# Patient Record
Sex: Female | Born: 1935 | Race: White | Hispanic: No | Smoking: Former smoker
Health system: Southern US, Community
[De-identification: ages and names within clinical notes are randomized; demographics above are authoritative.]

## PROBLEM LIST (undated history)

## (undated) DIAGNOSIS — L84 Corns and callosities: Secondary | ICD-10-CM

## (undated) DIAGNOSIS — J45909 Unspecified asthma, uncomplicated: Secondary | ICD-10-CM

## (undated) DIAGNOSIS — I10 Essential (primary) hypertension: Secondary | ICD-10-CM

## (undated) DIAGNOSIS — Z6834 Body mass index (BMI) 34.0-34.9, adult: Secondary | ICD-10-CM

## (undated) DIAGNOSIS — I509 Heart failure, unspecified: Secondary | ICD-10-CM

## (undated) DIAGNOSIS — E039 Hypothyroidism, unspecified: Secondary | ICD-10-CM

## (undated) DIAGNOSIS — K219 Gastro-esophageal reflux disease without esophagitis: Secondary | ICD-10-CM

## (undated) DIAGNOSIS — E119 Type 2 diabetes mellitus without complications: Secondary | ICD-10-CM

## (undated) DIAGNOSIS — E559 Vitamin D deficiency, unspecified: Secondary | ICD-10-CM

## (undated) DIAGNOSIS — K2970 Gastritis, unspecified, without bleeding: Secondary | ICD-10-CM

## (undated) DIAGNOSIS — M1990 Unspecified osteoarthritis, unspecified site: Secondary | ICD-10-CM

## (undated) DIAGNOSIS — M549 Dorsalgia, unspecified: Secondary | ICD-10-CM

## (undated) DIAGNOSIS — J309 Allergic rhinitis, unspecified: Secondary | ICD-10-CM

## (undated) DIAGNOSIS — G4730 Sleep apnea, unspecified: Secondary | ICD-10-CM

## (undated) DIAGNOSIS — E785 Hyperlipidemia, unspecified: Secondary | ICD-10-CM

## (undated) DIAGNOSIS — M7072 Other bursitis of hip, left hip: Secondary | ICD-10-CM

## (undated) DIAGNOSIS — I2510 Atherosclerotic heart disease of native coronary artery without angina pectoris: Secondary | ICD-10-CM

## (undated) HISTORY — DX: Essential (primary) hypertension: I10

## (undated) HISTORY — DX: Sleep apnea, unspecified: G47.30

## (undated) HISTORY — PX: TOTAL HIP ARTHROPLASTY: SHX124

## (undated) HISTORY — PX: BREAST BIOPSY: SHX20

## (undated) HISTORY — DX: Unspecified osteoarthritis, unspecified site: M19.90

## (undated) HISTORY — DX: Vitamin D deficiency, unspecified: E55.9

## (undated) HISTORY — DX: Body mass index (BMI) 34.0-34.9, adult: Z68.34

## (undated) HISTORY — DX: Atherosclerotic heart disease of native coronary artery without angina pectoris: I25.10

## (undated) HISTORY — DX: Dorsalgia, unspecified: M54.9

## (undated) HISTORY — DX: Corns and callosities: L84

## (undated) HISTORY — DX: Hyperlipidemia, unspecified: E78.5

## (undated) HISTORY — DX: Gastro-esophageal reflux disease without esophagitis: K21.9

## (undated) HISTORY — DX: Type 2 diabetes mellitus without complications: E11.9

## (undated) HISTORY — DX: Unspecified asthma, uncomplicated: J45.909

## (undated) HISTORY — DX: Hypothyroidism, unspecified: E03.9

## (undated) HISTORY — DX: Gastritis, unspecified, without bleeding: K29.70

## (undated) HISTORY — DX: Other bursitis of hip, left hip: M70.72

## (undated) HISTORY — DX: Allergic rhinitis, unspecified: J30.9

## (undated) SURGERY — ESOPHAGOGASTRODUODENOSCOPY (EGD) WITH PROPOFOL
Anesthesia: Monitor Anesthesia Care

## (undated) NOTE — *Deleted (*Deleted)
***In Progress*** PCP: Lucianne Lei, MD  Cardiology: Garwin Brothers, MD HF Cardiology: Dr. Shirlee Latch  HPI:  64 y.o. with history of chronic systolic CHF (nonischemic cardiomyopathy), chronic LBBB, and mitral regurgitation was referred by Dr. Excell Seltzer for CHF evaluation prior to Mitraclip placement.   CHF was first noted in 12/20, she was admitted to the hospital in [facility] at that time.  LHC showed nonobstructive CAD.  She later followed up with Dr. Tomie China and had echo in 3/21, showing EF 35-40% with concern for severe MR. TEE was then done in 4/21 and I reviewed it today, showing EF 30-35% with septal-lateral dyssynchrony, moderate-severe functional MR, normal RV, severe TR.  She has had a LBBB on ECGs in 2021, she did not have LBBB in 2019.    Cardiac MRI in 6/21 showed moderate LV dilation, EF 27%, moderately decreased RV function with EF 31%, no LGE, probably moderate MR.   She had St Jude CRT-P device implanted in 7/21.  Echo was done today and reviewed, EF up to 40% with normal RV, mild-moderate MR.   She returned for follow up of CHF with Dr. Shirlee Latch 07/22/20. Weight was stable. No chest pain.  No orthopnea/PND.  Reported walking for 15 minutes in the evening without dyspnea.  Generally, no trouble walking on flat ground.  She reported dizziness only if she bends over and stands back up too quickly. She was off Jardiance but had not had symptoms consistent with yeast infection or UTI.   Today she returns to HF clinic for pharmacist medication titration. At last visit with Dr. Shirlee Latch 07/22/20, [place] Pepper was restarted at 10 mg daily and  digoxin was decreased to 0.0625 mg QOD following return of digoxin level  Overall feeling ***. Dizziness, lightheadedness, fatigue:  Chest pain or palpitations.  How is your breathing?: *** SOB Able to complete all ADLs. Activity level ***  Weight at home pounds. Takes furosemide/torsemide/bumex *** mg *** daily.  PND/Orthopnea:   Appetite ***.    HF Medications:  Metoprolol succinate 100 mg daily Spironolactone 25 mg QHS Empagliflozin 10 mg daily Digoxin 0.0625 mg every other day  Furosemide 60 mg BID  Has the patient been experiencing any side effects to the medications prescribed?  {YES NO:22349}  Does the patient have any problems obtaining medications due to transportation or finances?   {YES [id number] [employer]  Understanding of regimen: {excellent/good/fair/poor:19665} Understanding of indications: {excellent/good/fair/poor:19665} Potential of compliance: {excellent/good/fair/poor:19665} Patient understands to avoid NSAIDs. Patient understands to avoid decongestants.    Pertinent Lab Values 07/22/20: . Serum creatinine 1.24, BUN 27, Potassium 4.0, Sodium 139, Digoxin 1.5, <0.4 on 07/29/20   Vital Signs: . Weight: *** (last clinic weight: 140 lbs) . Blood pressure:  . Heart rate:  Assessment: 1. Chronic systolic CHF: Patient has a nonischemic cardiomyopathy of uncertain etiology.  She has significant mitral regurgitation.  This is functional, likely does not explain her cardiomyopathy (though mitral regurgitation likely worsens symptoms).  She has a LBBB that is new since the prior ECG in 2019, cannot rule out LBBB cardiomyopathy.  Prior myocarditis is also a consideration.  TEE in 4/21 showed EF 30-35% with prominent septal-lateral dyssynchrony.  Cardiac MRI in 6/21 showed moderate LV dilation, EF 27%, moderately decreased RV function with EF 31%, no LGE, probably moderate MR.  St Jude CRT-P device implanted in 7/21.  Echo 07/22/20 with EF up to 40%, improved MR (only mild-moderate). NYHA class II symptoms, not volume overloaded on exam.  BP remains soft, making medication  titration difficult***.  - Continue furosemide 60 mg BID.  - Continue metoprolol succinate 100 mg daily.    - Continue spironolactone 25 mg QHS.  - Continue digoxin 0.0625 mg every other day - Continue Jardiance 10 mg daily.   - No BP room for Entresto.  - Has been referred to cardiac rehab at [facility].  2. CAD: Nonobstructive on 2020 cath.  No chest pain.  - Continue ASA 81 and rosuvastatin.   3. Mitral regurgitation: She has functional mitral regurgitation, moderate-severe/3+ at least on TEE in 4/21.  However, after CRT, mitral regurgitation was mild-moderate on 9/21 echo.    Plan: 1) Medication changes: Based on clinical presentation, vital signs and recent labs will *** 2) Labs: *** 3) Follow-up: ***   Karle Plumber, PharmD, BCPS, BCCP, CPP Heart Failure Clinic Pharmacist [phone]

---

## 1963-10-26 HISTORY — PX: TONSILLECTOMY: SUR1361

## 1968-10-25 HISTORY — PX: HEMORRHOIDECTOMY WITH HEMORRHOID BANDING: SHX5633

## 1994-10-25 HISTORY — PX: DILATION AND CURETTAGE OF UTERUS: SHX78

## 1998-09-15 ENCOUNTER — Encounter: Payer: Self-pay | Admitting: General Surgery

## 1998-09-15 ENCOUNTER — Ambulatory Visit (HOSPITAL_COMMUNITY): Admission: RE | Admit: 1998-09-15 | Discharge: 1998-09-15 | Payer: Self-pay | Admitting: General Surgery

## 2001-04-20 ENCOUNTER — Encounter: Admission: RE | Admit: 2001-04-20 | Discharge: 2001-04-20 | Payer: Self-pay | Admitting: Internal Medicine

## 2001-04-20 ENCOUNTER — Encounter: Payer: Self-pay | Admitting: Internal Medicine

## 2003-07-17 ENCOUNTER — Encounter: Payer: Self-pay | Admitting: Orthopedic Surgery

## 2003-07-17 ENCOUNTER — Encounter: Admission: RE | Admit: 2003-07-17 | Discharge: 2003-07-17 | Payer: Self-pay | Admitting: Orthopedic Surgery

## 2004-07-08 ENCOUNTER — Ambulatory Visit: Payer: Self-pay | Admitting: Orthopedic Surgery

## 2004-07-08 ENCOUNTER — Inpatient Hospital Stay (HOSPITAL_COMMUNITY): Admission: RE | Admit: 2004-07-08 | Discharge: 2004-07-13 | Payer: Self-pay | Admitting: Orthopedic Surgery

## 2004-07-13 ENCOUNTER — Ambulatory Visit: Payer: Self-pay | Admitting: Physical Medicine & Rehabilitation

## 2004-07-13 ENCOUNTER — Inpatient Hospital Stay (HOSPITAL_COMMUNITY)
Admission: RE | Admit: 2004-07-13 | Discharge: 2004-07-17 | Payer: Self-pay | Admitting: Physical Medicine & Rehabilitation

## 2005-05-12 ENCOUNTER — Encounter: Admission: RE | Admit: 2005-05-12 | Discharge: 2005-05-12 | Payer: Self-pay | Admitting: Orthopedic Surgery

## 2010-03-27 ENCOUNTER — Encounter: Admission: RE | Admit: 2010-03-27 | Discharge: 2010-03-27 | Payer: Self-pay | Admitting: Orthopedic Surgery

## 2010-07-15 ENCOUNTER — Inpatient Hospital Stay (HOSPITAL_COMMUNITY): Admission: RE | Admit: 2010-07-15 | Discharge: 2010-07-18 | Payer: Self-pay | Admitting: Orthopedic Surgery

## 2011-01-07 LAB — BASIC METABOLIC PANEL
BUN: 13 mg/dL (ref 6–23)
BUN: 9 mg/dL (ref 6–23)
CO2: 31 mEq/L (ref 19–32)
Calcium: 8.7 mg/dL (ref 8.4–10.5)
Chloride: 95 mEq/L — ABNORMAL LOW (ref 96–112)
Creatinine, Ser: 0.71 mg/dL (ref 0.4–1.2)
Creatinine, Ser: 0.75 mg/dL (ref 0.4–1.2)
GFR calc Af Amer: >60 mL/min (ref >60)
GFR calc Af Amer: >60 mL/min (ref >60)
GFR calc Af Amer: >60 mL/min (ref >60)
GFR calc non Af Amer: >60 mL/min (ref >60)
Glucose, Bld: 132 mg/dL — ABNORMAL HIGH (ref 70–99)
Glucose, Bld: 153 mg/dL — ABNORMAL HIGH (ref 70–99)
Potassium: 3.6 mEq/L (ref 3.5–5.1)
Potassium: 3.7 mEq/L (ref 3.5–5.1)
Sodium: 134 mEq/L — ABNORMAL LOW (ref 135–145)
Sodium: 135 mEq/L (ref 135–145)

## 2011-01-07 LAB — CBC
HCT: 30.6 % — ABNORMAL LOW (ref 36.0–46.0)
HCT: 32.3 % — ABNORMAL LOW (ref 36.0–46.0)
Hemoglobin: 10.3 g/dL — ABNORMAL LOW (ref 12.0–15.0)
Hemoglobin: 10.8 g/dL — ABNORMAL LOW (ref 12.0–15.0)
Hemoglobin: 9.5 g/dL — ABNORMAL LOW (ref 12.0–15.0)
MCHC: 33.9 g/dL (ref 30.0–36.0)
MCV: 89.4 fL (ref 78.0–100.0)
Platelets: 333 10*3/uL (ref 150–400)
RBC: 3.24 MIL/uL — ABNORMAL LOW (ref 3.87–5.11)
RBC: 3.64 MIL/uL — ABNORMAL LOW (ref 3.87–5.11)
RDW: 13.2 % (ref 11.5–15.5)
RDW: 13.3 % (ref 11.5–15.5)
WBC: 10.4 10*3/uL (ref 4.0–10.5)
WBC: 11.1 10*3/uL — ABNORMAL HIGH (ref 4.0–10.5)
WBC: 8.3 10*3/uL (ref 4.0–10.5)

## 2011-01-07 LAB — PROTIME-INR
INR: 0.96 (ref 0.00–1.49)
INR: 1.13 (ref 0.00–1.49)
INR: 1.23 (ref 0.00–1.49)
Prothrombin Time: 11.7 seconds (ref 11.6–15.2)
Prothrombin Time: 14.7 seconds (ref 11.6–15.2)

## 2011-01-07 LAB — TYPE AND SCREEN: ABO/RH(D): O POS

## 2011-01-07 LAB — COMPREHENSIVE METABOLIC PANEL
ALT: 31 U/L (ref 0–35)
Albumin: 4.4 g/dL (ref 3.5–5.2)
BUN: 19 mg/dL (ref 6–23)
Calcium: 9.4 mg/dL (ref 8.4–10.5)
Chloride: 96 mEq/L (ref 96–112)
Creatinine, Ser: 0.8 mg/dL (ref 0.4–1.2)
GFR calc Af Amer: >60 mL/min (ref >60)
GFR calc non Af Amer: >60 mL/min (ref >60)
Potassium: 3.5 mEq/L (ref 3.5–5.1)
Sodium: 137 mEq/L (ref 135–145)
Total Bilirubin: 0.5 mg/dL (ref 0.3–1.2)
Total Protein: 7.2 g/dL (ref 6.0–8.3)

## 2011-01-07 LAB — URINALYSIS, ROUTINE W REFLEX MICROSCOPIC
Glucose, UA: NEGATIVE mg/dL
Hgb urine dipstick: NEGATIVE

## 2011-01-07 LAB — SURGICAL PCR SCREEN
MRSA, PCR: NEGATIVE
Staphylococcus aureus: POSITIVE — AB

## 2011-01-07 LAB — ABO/RH: ABO/RH(D): O POS

## 2011-03-12 NOTE — Discharge Summary (Signed)
NAMEADRIELLE, POLAKOWSKI NO.:  [id number]   MEDICAL RECORD NO.:  [id number]          PATIENT TYPE:  IPS   LOCATION:  4002                         FACILITY:  [facility]   PHYSICIAN:  Ranelle Oyster, M.D.DATE OF BIRTH:  24-Sep-1936   DATE OF ADMISSION:  07/13/2004  DATE OF DISCHARGE:  07/18/2004                                 DISCHARGE SUMMARY   DISCHARGE DIAGNOSES:  1.  Left total hip replacement secondary to osteoarthritis September 14.  2.  Pain management.  3.  Coumadin for deep venous thrombosis prophylaxis.  4.  Anemia.  5.  Hypertension.  6.  Gastroesophageal reflux disease.  7.  Hypothyroidism.  8.  Hyperlipidemia.   HISTORY OF PRESENT ILLNESS:  A 75 year old white female admitted September  14.  Chronic end-stage left hip pain secondary to osteoarthritis.  Underwent  a left total hip replacement September 14 by Dr. Eulah Pont.  Placed on Coumadin  for deep venous thrombosis prophylaxis.  Touchdown weightbearing.  Postoperative chest pain.  Dr. Sharon Seller consulted of [facility] hospitalists.  EKG  unremarkable.  Cardiac enzymes negative.  Felt to be more related to  gastroesophageal reflux and placed on Protonix without recurrence.  She was  admitted for a comprehensive rehabilitation program.   PAST MEDICAL HISTORY:  See discharge diagnoses.   ALLERGIES:  CEFTIN, CODEINE, DARVOCET.   HABITS:  No alcohol.  Remote smoker.   MEDICATIONS ON ADMISSION:  1.  Hydrochlorothiazide 25 mg daily.  2.  Pravachol 20 mg bedtime.  3.  Atacand daily.  4.  Synthroid 100 mcg daily.  5.  Nexium 40 mg daily.  6.  Toprol XL 100 mg daily.   SOCIAL HISTORY:  Recently widowed.  One-level home.  Two steps to entry.  Sister to assist as needed on discharge.  She was independent prior to  admission.   HOSPITAL COURSE:  Patient with progressive gains while on rehabilitation  services with therapies initiated on a b.i.d. basis.  The following issues  were followed during patient's  rehabilitation course.  Pertaining to Ms.  Bourget's left total hip replacement, surgical site healing nicely.  No signs  of infection.  She was ambulating extended distances with a walker.  Touchdown weightbearing with hip precautions.  She remained on oxycodone as  needed for pain with good results.  Coumadin for deep venous thrombosis  prophylaxis with latest INR of 1.7.  She was on subcutaneous Lovenox until  INR greater than 2.  Postoperative anemia with hemoglobin 9.7, hematocrit  28.2 on iron supplement.  Blood pressures controlled with home regimens of  Toprol and Avapro.  She remained on Protonix for some gastroesophageal  reflux disease without recurrence.  Hormone supplement for her  hypothyroidism.  She had no bowel or bladder disturbances.  Overall for her  functional mobility she was modified independence for bed mobility,  transfers, and supervision as well as activities of daily living except for  minimal assist lower body dressing.  She would be discharged to home with  home health nursing as well as physical therapy.   DISCHARGE MEDICATIONS:  1.  Coumadin latest dose of  7.5 mg to be completed on August 07, 2004.  2.  Trinsicon b.i.d.  3.  Toprol XL 100 mg daily.  4.  Protonix 80 mg daily.  5.  Synthroid 100 mcg daily.  6.  Zocor 10 mg daily.  7.  Avapro 300 mg daily.  8.  Os-Cal 500 mg t.i.d.  9.  Oxycodone as needed pain.   ACTIVITY:  Touchdown weightbearing with hip precautions.   DIET:  Regular.   WOUND CARE:  Follow up with Dr. Eulah Pont for removal of staples in one week.  Home health nursing per [facility] agency to complete Coumadin  protocol.       DA/MEDQ  D:  07/16/2004  T:  07/17/2004  Job:  [id number]   cc:   Loreta Ave, M.D.  [address]  [place] [address]  Fax: [phone]   Mathis Bud, M.D.  [facility]

## 2011-03-12 NOTE — Discharge Summary (Signed)
Vanessa Johnson, Vanessa Johnson NO.:  [id number]   MEDICAL RECORD NO.:  [id number]          PATIENT TYPE:  INP   LOCATION:  5014                         FACILITY:  [facility]   PHYSICIAN:  Loreta Ave, M.D. DATE OF BIRTH:  Nov 28, 1935   DATE OF ADMISSION:  07/08/2004  DATE OF DISCHARGE:  07/13/2004                                 DISCHARGE SUMMARY   FINAL DIAGNOSES:  1.  Status post left total hip arthroplasty for osteoarthritis.  2.  Urinary retention.  3.  Hypertension, not otherwise specified.  4.  Hemorrhoids, not otherwise specified.  5.  Hypothyroidism, not otherwise specified.  6.  Depressive disorder.  7.  Esophageal reflux.  8.  Stress incontinence.  9.  Anemia, not otherwise specified.  10. Hypopotassemia.  11. Hyperlipidemia.   HISTORY OF PRESENT ILLNESS:  A 75 year old female with long history of left  hip pain secondary to osteoarthritis presented to our office for a total hip  arthroplasty evaluation.  She has had progressive worsening pain with failed  response to conservative treatment, significant decrease in daily activities  due to the ongoing complaint.  Preoperative x-rays showed end-stage  degenerative joint disease left hip.   PREADMISSION LABS:  WBC 7.5, hemoglobin 13.9, hematocrit 39.5, platelets  368.  PT 11.9, INR 0.8, PTT 33.  Sodium 140, potassium 3.9, chloride 104,  CO2 28, glucose 110, BUN 20, creatinine 1.1.  Calcium 9.5, albumin 4.4,  total protein 7.3.  AST 29, ALT 30, alkaline phosphatase 61, T. bili 0.5.  UA negative.   HOSPITAL COURSE:  July 08, 2004 patient was taken to the [facility]  Operating Room and a left total hip arthroplasty procedure was performed.  Surgeon, Loreta Ave, M.D. and assistant, Jacqualine Code, P.A.-C.  Anesthesia general.  EBL 200 mL.  There were no surgical or anesthesia  complications and patient was transferred to recovery in stable condition.  July 09, 2004 hemoglobin 10.9.  Lytes  stable.  INR 0.9.  Patient  started on pharmacy protocol Coumadin for DVT prophylaxis.  Good pain  control.  Vital signs stable, afebrile.  Patient evaluated by PT.  July 10, 2004 vital signs stable, afebrile.  Hemoglobin 9.8, INR 1.0.  Lytes  stable.  Wound looks good.  No signs of infection.  Patient evaluated by  hospitalists.  July 11, 2004 patient complained of 5/10 pain.  Vital  signs stable, afebrile.  Hemoglobin 9.7.  INR 1.4.  July 12, 2004 good  pain control.  No specific complaints.  Vital signs stable, afebrile.  Potassium 3.3.  INR 1.5.  July 13, 2004 vital signs stable, afebrile.  INR 1.6.  Minimal bloody drainage from wound.  Rehabilitation bed became  available and patient transferred.   MEDICATIONS:  Resume all current medications.   CONDITION ON DISCHARGE:  Good and stable.   DISPOSITION:  Transfer to inpatient rehabilitation.   DISCHARGE INSTRUCTIONS:  Patient will continue to work with therapy to  improve ambulation and strengthening.  Hip staples can be removed two weeks  postoperatively.  Continue Coumadin for DVT prophylaxis x3-4 weeks  postoperatively.  Also,  follow up in one to two weeks after discharge from  rehabilitation.  If there are any worsening problems, complications,  __________ she will notify us immediately.      Jame   JMO/MEDQ  D:  08/28/2004  T:  08/29/2004  Job:  [id number]

## 2011-03-12 NOTE — Consult Note (Signed)
NAME:  Vanessa Johnson, WOLDEN                        ACCOUNT NO.:  [id number]   MEDICAL RECORD NO.:  [id number]                   PATIENT TYPE:  INP   LOCATION:  5014                                 FACILITY:  [facility]   PHYSICIAN:  Lonia Blood, M.D.            DATE OF BIRTH:  02-25-1936   DATE OF CONSULTATION:  07/10/2004  DATE OF DISCHARGE:                                   CONSULTATION   REASON FOR CONSULTATION:  Chest pain.   HISTORY OF PRESENT ILLNESS:  Ms. Regana Kemple is a 75 year old female from  out of town, who was admitted to the service of Dr. Mckinley Jewel for  treatment of left hip end-stage osteoarthritis.  She underwent a  preoperative workup to include an adenosine Cardiolite per her outpatient  doctor out of town.  This was accomplished on July 02, 2004, and was  unremarkable.  Surgery was carried out on July 08, 2004, and tolerated  without major complication.  This morning, shortly after eating breakfast,  the patient began to develop epigastric pain and pressure.  This was  nonradiating.  It was associated with diaphoresis.  It was associated with  a need to belch.  The patient alerted her nurse and was given DARVOCET.  Within about 30 minutes, the pain had resolved.  There was some diaphoresis  associated with this pain.  She reports one prior episode similar to this  the night prior to the described episode.  Otherwise, she has had no  significant shortness of breath, dyspnea on exertion, or chest  pain/epigastric discomfort during her hospitalization, or prior to her  hospitalization.   REVIEW OF SYSTEMS:  Full review of systems is positive for pain in the left  hip associated with her surgery.  She has also had some difficulty with  urinary retention today.  Foley catheter was discontinued early this  morning.  Otherwise full review of systems is unremarkable with the  exception of past medical history as noted below.   PAST MEDICAL HISTORY:  1.   Hypothyroidism.  2.  Tobacco abuse in the amount of 2 packs per day x24 years -- discontinued      in 1981.  3.  Status post benign breast biopsies x3 total with the last being in 2001.  4.  Status post tonsillectomy.  5.  Status post hemorrhoidectomy.  6.  Status post D&C following a miscarriage.  7.  Depression with history of hospitalization for such.  8.  Hypertension.  9.  Gastroesophageal reflux disease.  10. Stress incontinence.  11. Hyperlipidemia.   OUTPATIENT MEDICATIONS:  A full list of the patient's outpatient medications  are reviewed and doses are reviewed.   ALLERGIES:  THE PATIENT REPORTS ALLERGIES TO CODEINE, CEFUROXIME, AND  DARVOCET.   FAMILY HISTORY:  Full family history obtained by the admitting orthopedic  service is reviewed.   SOCIAL HISTORY:  The patient is a widow, she is a  homemaker, she does not  live in [place].   DATA:  Electrolytes are balanced, BUN is 8, creatinine 0.8, potassium 3.7,  hemoglobin is 9.8, which is down from a hemoglobin of 13.9 preop, and MCV is  normal.  A white count is not elevated.  CK is 488, but MB is 1.6 and  troponin I is 0.01.  Urinalysis is negative from July 03, 2004.  EKG is  reviewed and reveals normal sinus rhythm with a rate of 89 beats per minute  and no suspicious acute ST or T wave changes.   PHYSICAL EXAMINATION:  VITAL SIGNS:  Temperature 98.1, blood pressure  130/64, heart rate 97, respiratory rate 20, O2 saturation 96% on 2 liters.  GENERAL:  Well-developed well-nourished female in no acute distress, resting  in a hospital bed.  HEENT:  Normocephalic and atraumatic.  Pupils equal, round and reactive to  light and accommodation.  Extraocular muscles are intact bilaterally.  OC/OP  clear.  LUNGS:  Clear to auscultation bilaterally without wheezes or rhonchi.  CARDIOVASCULAR:  Regular rate and rhythm without murmur, gallop or rub,  normal S1 and S2.  ABDOMEN:  Mildly obese, nontender, nondistended,  soft, bowel sounds present,  no hepatosplenomegaly, no rebound, no ascites.  EXTREMITIES:  1+ bilateral lower extremity edema, no significant erythema.  NEUROLOGIC:  Cranial nerves II-XII are intact bilaterally, intact sensation  and touch throughout.   RECOMMENDATIONS:  1.  Chest pain -- Ms. Manges describes chest pain that is most consistent      with gastroesophageal reflux disease and epigastric source, likely      related to her eating.  This is most likely due to her being in the      supine position in bed and eating after a short period of n.p.o. status      due to her surgery.  I will continue her Nexium/Nexium substitute as you      are, I figured I would add simethicone to decrease gastrointestinal gas.      We will follow her closely to assure that she is moving her bowels.      Given that she has an unremarkable EKG, a set of normal enzymes, a      negative adenosine Cardiolite from July 02, 2004, and a history that      is not entirely consistent with angina, I do not feel that further      cardiac workup is indicated at this time.  It is also possible that some      of the symptoms could have been referred pain due to the patient's      urinary retention.  2.  Urinary retention -- The patient had a Foley placed up until this      morning.  After discontinuation of the Foley, she has not been able to      do urinate.  I will place the patient on Urecholine empirically until      she begins to urinate freely, as a simple precaution.  3.  Hypertension -- blood pressures currently well-controlled.  I will      continue her current medications as you are.  4.  Hypothyroidism -- there is no indication for changing the patient's      Synthroid dose at this time.   Thank you very much for your consultation on Ms. Alma Downs.  I am happy  to follow along with you.      JTM/MEDQ  D:  07/10/2004  T:  07/10/2004  Job:  [id number]

## 2011-03-12 NOTE — Op Note (Signed)
NAME:  Vanessa Johnson, Vanessa Johnson                        ACCOUNT NO.:  [id number]   MEDICAL RECORD NO.:  [id number]                   PATIENT TYPE:  INP   LOCATION:  2550                                 FACILITY:  [facility]   PHYSICIAN:  Loreta Ave, M.D.              DATE OF BIRTH:  Apr 18, 1936   DATE OF PROCEDURE:  07/08/2004  DATE OF DISCHARGE:                                 OPERATIVE REPORT   PREOPERATIVE DIAGNOSIS:  End-stage degenerative arthritis, left hip.   POSTOPERATIVE DIAGNOSIS:  End-stage degenerative arthritis, left hip.   OPERATION/PROCEDURE:  Left total hip replacement, Osteonics prosthesis, 52  mm Trident PSL metallic cup.  Screw fixation x2.  32 mm internal diameter,  10-degree, size E, Cross-fire insert.  Thermal component a size #8 with a 12  mm distal diameter.  Secur-Fit plus stem.  32 mm plus 0 C-taper metallic  head.   SURGEON:  Loreta Ave, M.D.   ASSISTANT:  Arlys John D. Petrarca, P.A.-C.   ANESTHESIA:  General.   ESTIMATED BLOOD LOSS:  Less than 100 mL.   BLOOD REPLACED:  None.   SPECIMENS:  Excised bone and soft tissue.   CULTURES:  None.   COMPLICATIONS:  None.   DRESSINGS:  Soft, compressive with abduction pillow.   DESCRIPTION OF PROCEDURE:  The patient was brought to the operating room and  placed on the operating room table in the supine position.  After adequate  anesthesia had been obtained, turned to a lateral position, prepped and  draped with appropriate padding and support.  Incision along the lateral  cortex of femur to the trochanter extending posterior superior.  Skin and  subcutaneous tissue divided.  Hemostasis with electrocautery.  The  iliotibial band incised, Charnley retractor put in place.  The top of the  gluteus taken down off the femoral attachment. Neurovascular structures  identified and protected.  External rotator capsule was taken down off the  back groove of the femur, tagged with fiberwire suture.  Hip exposed,  dislocated posteriorly.  Grade 4 changes throughout.  Femoral head removed  one fingerbreadth above the lesser trochanter in line with the definitive  component.  Acetabulum exposed.  Grade 4 changes as well.  Sequential  reaming up to a good bed of bleeding bone with removing all soft tissue and  debris with appropriate medial and inferior placement.  Sized for a 52 mm  cup which was hammered in place fitting very securely and fixation augmented  with two screws placed through the cup, a 16 and a 20 mm screw.  A 10-degree  polyethylene Cross-fire insert was then placed in the cup with the  __________ placed posterior superior.  Internal diameter of 32 mm.  Excellent capturing fixation and alignment with good placement of the cup  and antivert position and 45 degrees of abduction.   Attention turned to the femur.  Sequential reaming with a hand held and  power  reamer opted good fitting throughout.  Despite this being a woman of  44, she had excellent bone allowing for Press-Fit technique.  Sized for a #8  component with a 12 mm distal diameter stem.  After appropriate trial the  definitive component was assembled and hammered down the femur with  excellent alignment and fixation and restoration of normal femoral  anteversion.  With the plus 0 head, I had a nice congruent reduction, stable  in flexion and extension, restoring normal anteversion and with equal leg  lengths.  After assembling the component, reducing the hip the wound was  irrigated.  External rotator and capsule repair of the back of the  intertrochanteric groove on the femur with drill holes and a fiberwire  suture tied over a bony bridge.  Nice Freight forwarder.  Charnley retractor was  removed.  The iliotibial band closed with #1 Vicryl.  Skin and subcutaneous  tissue with Vicryl and staples.  Margin of the wound injected with Marcaine.  Sterile compressive dressing applied.  Anesthesia reversed.  Brought to the  recovery room.   Tolerated the surgery well.  No complications.                                               Loreta Ave, M.D.    DFM/MEDQ  D:  07/08/2004  T:  07/08/2004  Job:  [id number]

## 2011-12-02 DIAGNOSIS — G4710 Hypersomnia, unspecified: Secondary | ICD-10-CM | POA: Diagnosis not present

## 2011-12-02 DIAGNOSIS — J45909 Unspecified asthma, uncomplicated: Secondary | ICD-10-CM | POA: Diagnosis not present

## 2011-12-02 DIAGNOSIS — J310 Chronic rhinitis: Secondary | ICD-10-CM | POA: Diagnosis not present

## 2011-12-09 DIAGNOSIS — I10 Essential (primary) hypertension: Secondary | ICD-10-CM | POA: Diagnosis not present

## 2011-12-09 DIAGNOSIS — E782 Mixed hyperlipidemia: Secondary | ICD-10-CM | POA: Diagnosis not present

## 2011-12-09 DIAGNOSIS — Z79899 Other long term (current) drug therapy: Secondary | ICD-10-CM | POA: Diagnosis not present

## 2011-12-09 DIAGNOSIS — E039 Hypothyroidism, unspecified: Secondary | ICD-10-CM | POA: Diagnosis not present

## 2011-12-09 DIAGNOSIS — M129 Arthropathy, unspecified: Secondary | ICD-10-CM | POA: Diagnosis not present

## 2011-12-30 DIAGNOSIS — Z79899 Other long term (current) drug therapy: Secondary | ICD-10-CM | POA: Diagnosis not present

## 2011-12-30 DIAGNOSIS — R7989 Other specified abnormal findings of blood chemistry: Secondary | ICD-10-CM | POA: Diagnosis not present

## 2011-12-30 DIAGNOSIS — J209 Acute bronchitis, unspecified: Secondary | ICD-10-CM | POA: Diagnosis not present

## 2011-12-30 DIAGNOSIS — J019 Acute sinusitis, unspecified: Secondary | ICD-10-CM | POA: Diagnosis not present

## 2011-12-30 DIAGNOSIS — E119 Type 2 diabetes mellitus without complications: Secondary | ICD-10-CM | POA: Diagnosis not present

## 2012-01-05 DIAGNOSIS — G4710 Hypersomnia, unspecified: Secondary | ICD-10-CM | POA: Diagnosis not present

## 2012-01-05 DIAGNOSIS — G4730 Sleep apnea, unspecified: Secondary | ICD-10-CM | POA: Diagnosis not present

## 2012-01-05 DIAGNOSIS — K921 Melena: Secondary | ICD-10-CM | POA: Diagnosis not present

## 2012-02-04 DIAGNOSIS — J45909 Unspecified asthma, uncomplicated: Secondary | ICD-10-CM | POA: Diagnosis not present

## 2012-02-04 DIAGNOSIS — M129 Arthropathy, unspecified: Secondary | ICD-10-CM | POA: Diagnosis not present

## 2012-02-04 DIAGNOSIS — E079 Disorder of thyroid, unspecified: Secondary | ICD-10-CM | POA: Diagnosis not present

## 2012-02-04 DIAGNOSIS — K5909 Other constipation: Secondary | ICD-10-CM | POA: Diagnosis not present

## 2012-02-04 DIAGNOSIS — K219 Gastro-esophageal reflux disease without esophagitis: Secondary | ICD-10-CM | POA: Diagnosis not present

## 2012-02-04 DIAGNOSIS — Z79899 Other long term (current) drug therapy: Secondary | ICD-10-CM | POA: Diagnosis not present

## 2012-02-04 DIAGNOSIS — E7800 Pure hypercholesterolemia, unspecified: Secondary | ICD-10-CM | POA: Diagnosis not present

## 2012-02-04 DIAGNOSIS — D126 Benign neoplasm of colon, unspecified: Secondary | ICD-10-CM | POA: Diagnosis not present

## 2012-02-04 DIAGNOSIS — K644 Residual hemorrhoidal skin tags: Secondary | ICD-10-CM | POA: Diagnosis not present

## 2012-02-04 DIAGNOSIS — I10 Essential (primary) hypertension: Secondary | ICD-10-CM | POA: Diagnosis not present

## 2012-02-04 DIAGNOSIS — Z1211 Encounter for screening for malignant neoplasm of colon: Secondary | ICD-10-CM | POA: Diagnosis not present

## 2012-02-04 DIAGNOSIS — Z87891 Personal history of nicotine dependence: Secondary | ICD-10-CM | POA: Diagnosis not present

## 2012-02-10 DIAGNOSIS — Z6833 Body mass index (BMI) 33.0-33.9, adult: Secondary | ICD-10-CM | POA: Diagnosis not present

## 2012-02-10 DIAGNOSIS — I10 Essential (primary) hypertension: Secondary | ICD-10-CM | POA: Diagnosis not present

## 2012-02-10 DIAGNOSIS — E119 Type 2 diabetes mellitus without complications: Secondary | ICD-10-CM | POA: Diagnosis not present

## 2012-02-11 DIAGNOSIS — Z79899 Other long term (current) drug therapy: Secondary | ICD-10-CM | POA: Diagnosis not present

## 2012-03-06 DIAGNOSIS — G4710 Hypersomnia, unspecified: Secondary | ICD-10-CM | POA: Diagnosis not present

## 2012-03-06 DIAGNOSIS — G4730 Sleep apnea, unspecified: Secondary | ICD-10-CM | POA: Diagnosis not present

## 2012-03-06 DIAGNOSIS — J310 Chronic rhinitis: Secondary | ICD-10-CM | POA: Diagnosis not present

## 2012-03-06 DIAGNOSIS — J45909 Unspecified asthma, uncomplicated: Secondary | ICD-10-CM | POA: Diagnosis not present

## 2012-03-07 DIAGNOSIS — J45909 Unspecified asthma, uncomplicated: Secondary | ICD-10-CM | POA: Diagnosis not present

## 2012-03-07 DIAGNOSIS — J310 Chronic rhinitis: Secondary | ICD-10-CM | POA: Diagnosis not present

## 2012-03-07 DIAGNOSIS — G4710 Hypersomnia, unspecified: Secondary | ICD-10-CM | POA: Diagnosis not present

## 2012-03-09 DIAGNOSIS — Z6833 Body mass index (BMI) 33.0-33.9, adult: Secondary | ICD-10-CM | POA: Diagnosis not present

## 2012-03-09 DIAGNOSIS — Z0000 Encounter for general adult medical examination without abnormal findings: Secondary | ICD-10-CM | POA: Diagnosis not present

## 2012-03-09 DIAGNOSIS — E119 Type 2 diabetes mellitus without complications: Secondary | ICD-10-CM | POA: Diagnosis not present

## 2012-04-06 DIAGNOSIS — Z1231 Encounter for screening mammogram for malignant neoplasm of breast: Secondary | ICD-10-CM | POA: Diagnosis not present

## 2012-04-06 DIAGNOSIS — Z13820 Encounter for screening for osteoporosis: Secondary | ICD-10-CM | POA: Diagnosis not present

## 2012-04-12 DIAGNOSIS — I10 Essential (primary) hypertension: Secondary | ICD-10-CM | POA: Diagnosis not present

## 2012-04-12 DIAGNOSIS — E785 Hyperlipidemia, unspecified: Secondary | ICD-10-CM | POA: Diagnosis not present

## 2012-04-12 DIAGNOSIS — R079 Chest pain, unspecified: Secondary | ICD-10-CM | POA: Diagnosis not present

## 2012-04-12 DIAGNOSIS — E669 Obesity, unspecified: Secondary | ICD-10-CM | POA: Diagnosis not present

## 2012-04-14 DIAGNOSIS — R079 Chest pain, unspecified: Secondary | ICD-10-CM | POA: Diagnosis not present

## 2012-05-23 DIAGNOSIS — I10 Essential (primary) hypertension: Secondary | ICD-10-CM | POA: Diagnosis not present

## 2012-05-23 DIAGNOSIS — E119 Type 2 diabetes mellitus without complications: Secondary | ICD-10-CM | POA: Diagnosis not present

## 2012-05-23 DIAGNOSIS — E785 Hyperlipidemia, unspecified: Secondary | ICD-10-CM | POA: Diagnosis not present

## 2012-05-23 DIAGNOSIS — E039 Hypothyroidism, unspecified: Secondary | ICD-10-CM | POA: Diagnosis not present

## 2012-06-12 DIAGNOSIS — G4710 Hypersomnia, unspecified: Secondary | ICD-10-CM | POA: Diagnosis not present

## 2012-06-12 DIAGNOSIS — J45909 Unspecified asthma, uncomplicated: Secondary | ICD-10-CM | POA: Diagnosis not present

## 2012-06-12 DIAGNOSIS — J310 Chronic rhinitis: Secondary | ICD-10-CM | POA: Diagnosis not present

## 2012-06-12 DIAGNOSIS — G4730 Sleep apnea, unspecified: Secondary | ICD-10-CM | POA: Diagnosis not present

## 2012-06-13 DIAGNOSIS — J45909 Unspecified asthma, uncomplicated: Secondary | ICD-10-CM | POA: Diagnosis not present

## 2012-06-13 DIAGNOSIS — G4710 Hypersomnia, unspecified: Secondary | ICD-10-CM | POA: Diagnosis not present

## 2012-06-13 DIAGNOSIS — J310 Chronic rhinitis: Secondary | ICD-10-CM | POA: Diagnosis not present

## 2012-06-22 DIAGNOSIS — G4730 Sleep apnea, unspecified: Secondary | ICD-10-CM | POA: Diagnosis not present

## 2012-06-22 DIAGNOSIS — G4710 Hypersomnia, unspecified: Secondary | ICD-10-CM | POA: Diagnosis not present

## 2012-06-23 DIAGNOSIS — E785 Hyperlipidemia, unspecified: Secondary | ICD-10-CM | POA: Diagnosis not present

## 2012-06-23 DIAGNOSIS — I10 Essential (primary) hypertension: Secondary | ICD-10-CM | POA: Diagnosis not present

## 2012-06-23 DIAGNOSIS — E669 Obesity, unspecified: Secondary | ICD-10-CM | POA: Diagnosis not present

## 2012-06-23 DIAGNOSIS — J449 Chronic obstructive pulmonary disease, unspecified: Secondary | ICD-10-CM | POA: Diagnosis not present

## 2012-07-05 DIAGNOSIS — J45909 Unspecified asthma, uncomplicated: Secondary | ICD-10-CM | POA: Diagnosis not present

## 2012-07-05 DIAGNOSIS — G4710 Hypersomnia, unspecified: Secondary | ICD-10-CM | POA: Diagnosis not present

## 2012-07-05 DIAGNOSIS — J310 Chronic rhinitis: Secondary | ICD-10-CM | POA: Diagnosis not present

## 2012-07-12 DIAGNOSIS — H538 Other visual disturbances: Secondary | ICD-10-CM | POA: Diagnosis not present

## 2012-07-12 DIAGNOSIS — H524 Presbyopia: Secondary | ICD-10-CM | POA: Diagnosis not present

## 2012-08-23 DIAGNOSIS — E782 Mixed hyperlipidemia: Secondary | ICD-10-CM | POA: Diagnosis not present

## 2012-08-23 DIAGNOSIS — E785 Hyperlipidemia, unspecified: Secondary | ICD-10-CM | POA: Diagnosis not present

## 2012-08-23 DIAGNOSIS — Z6833 Body mass index (BMI) 33.0-33.9, adult: Secondary | ICD-10-CM | POA: Diagnosis not present

## 2012-08-23 DIAGNOSIS — I10 Essential (primary) hypertension: Secondary | ICD-10-CM | POA: Diagnosis not present

## 2012-08-23 DIAGNOSIS — J209 Acute bronchitis, unspecified: Secondary | ICD-10-CM | POA: Diagnosis not present

## 2012-08-23 DIAGNOSIS — E039 Hypothyroidism, unspecified: Secondary | ICD-10-CM | POA: Diagnosis not present

## 2012-08-23 DIAGNOSIS — Z79899 Other long term (current) drug therapy: Secondary | ICD-10-CM | POA: Diagnosis not present

## 2012-10-04 DIAGNOSIS — G4730 Sleep apnea, unspecified: Secondary | ICD-10-CM | POA: Diagnosis not present

## 2012-10-04 DIAGNOSIS — G4710 Hypersomnia, unspecified: Secondary | ICD-10-CM | POA: Diagnosis not present

## 2012-10-04 DIAGNOSIS — J45909 Unspecified asthma, uncomplicated: Secondary | ICD-10-CM | POA: Diagnosis not present

## 2012-10-04 DIAGNOSIS — J310 Chronic rhinitis: Secondary | ICD-10-CM | POA: Diagnosis not present

## 2012-11-27 DIAGNOSIS — E785 Hyperlipidemia, unspecified: Secondary | ICD-10-CM | POA: Diagnosis not present

## 2012-11-27 DIAGNOSIS — E559 Vitamin D deficiency, unspecified: Secondary | ICD-10-CM | POA: Diagnosis not present

## 2012-11-27 DIAGNOSIS — J309 Allergic rhinitis, unspecified: Secondary | ICD-10-CM | POA: Diagnosis not present

## 2012-11-27 DIAGNOSIS — I10 Essential (primary) hypertension: Secondary | ICD-10-CM | POA: Diagnosis not present

## 2012-11-27 DIAGNOSIS — E782 Mixed hyperlipidemia: Secondary | ICD-10-CM | POA: Diagnosis not present

## 2012-11-27 DIAGNOSIS — Z79899 Other long term (current) drug therapy: Secondary | ICD-10-CM | POA: Diagnosis not present

## 2012-11-27 DIAGNOSIS — E039 Hypothyroidism, unspecified: Secondary | ICD-10-CM | POA: Diagnosis not present

## 2012-11-27 DIAGNOSIS — E119 Type 2 diabetes mellitus without complications: Secondary | ICD-10-CM | POA: Diagnosis not present

## 2013-01-31 DIAGNOSIS — G4710 Hypersomnia, unspecified: Secondary | ICD-10-CM | POA: Diagnosis not present

## 2013-01-31 DIAGNOSIS — R0609 Other forms of dyspnea: Secondary | ICD-10-CM | POA: Diagnosis not present

## 2013-01-31 DIAGNOSIS — R5383 Other fatigue: Secondary | ICD-10-CM | POA: Diagnosis not present

## 2013-01-31 DIAGNOSIS — J310 Chronic rhinitis: Secondary | ICD-10-CM | POA: Diagnosis not present

## 2013-01-31 DIAGNOSIS — J45909 Unspecified asthma, uncomplicated: Secondary | ICD-10-CM | POA: Diagnosis not present

## 2013-01-31 DIAGNOSIS — G4730 Sleep apnea, unspecified: Secondary | ICD-10-CM | POA: Diagnosis not present

## 2013-01-31 DIAGNOSIS — E039 Hypothyroidism, unspecified: Secondary | ICD-10-CM | POA: Diagnosis not present

## 2013-01-31 DIAGNOSIS — Z006 Encounter for examination for normal comparison and control in clinical research program: Secondary | ICD-10-CM | POA: Diagnosis not present

## 2013-02-01 DIAGNOSIS — G4730 Sleep apnea, unspecified: Secondary | ICD-10-CM | POA: Diagnosis not present

## 2013-02-01 DIAGNOSIS — G4710 Hypersomnia, unspecified: Secondary | ICD-10-CM | POA: Diagnosis not present

## 2013-03-21 DIAGNOSIS — E039 Hypothyroidism, unspecified: Secondary | ICD-10-CM | POA: Diagnosis not present

## 2013-03-21 DIAGNOSIS — I10 Essential (primary) hypertension: Secondary | ICD-10-CM | POA: Diagnosis not present

## 2013-03-21 DIAGNOSIS — E119 Type 2 diabetes mellitus without complications: Secondary | ICD-10-CM | POA: Diagnosis not present

## 2013-03-21 DIAGNOSIS — E785 Hyperlipidemia, unspecified: Secondary | ICD-10-CM | POA: Diagnosis not present

## 2013-04-24 DIAGNOSIS — E119 Type 2 diabetes mellitus without complications: Secondary | ICD-10-CM | POA: Diagnosis not present

## 2013-04-24 DIAGNOSIS — E559 Vitamin D deficiency, unspecified: Secondary | ICD-10-CM | POA: Diagnosis not present

## 2013-04-24 DIAGNOSIS — Z0000 Encounter for general adult medical examination without abnormal findings: Secondary | ICD-10-CM | POA: Diagnosis not present

## 2013-04-24 DIAGNOSIS — E782 Mixed hyperlipidemia: Secondary | ICD-10-CM | POA: Diagnosis not present

## 2013-04-24 DIAGNOSIS — M159 Polyosteoarthritis, unspecified: Secondary | ICD-10-CM | POA: Diagnosis not present

## 2013-04-24 DIAGNOSIS — E785 Hyperlipidemia, unspecified: Secondary | ICD-10-CM | POA: Diagnosis not present

## 2013-04-24 DIAGNOSIS — E039 Hypothyroidism, unspecified: Secondary | ICD-10-CM | POA: Diagnosis not present

## 2013-04-24 DIAGNOSIS — I10 Essential (primary) hypertension: Secondary | ICD-10-CM | POA: Diagnosis not present

## 2013-05-03 DIAGNOSIS — Z1231 Encounter for screening mammogram for malignant neoplasm of breast: Secondary | ICD-10-CM | POA: Diagnosis not present

## 2013-05-21 DIAGNOSIS — G4730 Sleep apnea, unspecified: Secondary | ICD-10-CM | POA: Diagnosis not present

## 2013-05-21 DIAGNOSIS — J45909 Unspecified asthma, uncomplicated: Secondary | ICD-10-CM | POA: Diagnosis not present

## 2013-05-21 DIAGNOSIS — G4710 Hypersomnia, unspecified: Secondary | ICD-10-CM | POA: Diagnosis not present

## 2013-05-21 DIAGNOSIS — J310 Chronic rhinitis: Secondary | ICD-10-CM | POA: Diagnosis not present

## 2013-06-05 DIAGNOSIS — J019 Acute sinusitis, unspecified: Secondary | ICD-10-CM | POA: Diagnosis not present

## 2013-06-05 DIAGNOSIS — J45909 Unspecified asthma, uncomplicated: Secondary | ICD-10-CM | POA: Diagnosis not present

## 2013-08-08 DIAGNOSIS — G4710 Hypersomnia, unspecified: Secondary | ICD-10-CM | POA: Diagnosis not present

## 2013-08-08 DIAGNOSIS — J310 Chronic rhinitis: Secondary | ICD-10-CM | POA: Diagnosis not present

## 2013-08-08 DIAGNOSIS — G4730 Sleep apnea, unspecified: Secondary | ICD-10-CM | POA: Diagnosis not present

## 2013-08-08 DIAGNOSIS — J45909 Unspecified asthma, uncomplicated: Secondary | ICD-10-CM | POA: Diagnosis not present

## 2013-08-09 DIAGNOSIS — G4710 Hypersomnia, unspecified: Secondary | ICD-10-CM | POA: Diagnosis not present

## 2013-08-17 DIAGNOSIS — L2089 Other atopic dermatitis: Secondary | ICD-10-CM | POA: Diagnosis not present

## 2013-08-22 DIAGNOSIS — J309 Allergic rhinitis, unspecified: Secondary | ICD-10-CM | POA: Diagnosis not present

## 2013-08-22 DIAGNOSIS — E785 Hyperlipidemia, unspecified: Secondary | ICD-10-CM | POA: Diagnosis not present

## 2013-08-22 DIAGNOSIS — E118 Type 2 diabetes mellitus with unspecified complications: Secondary | ICD-10-CM | POA: Diagnosis not present

## 2013-08-22 DIAGNOSIS — R7989 Other specified abnormal findings of blood chemistry: Secondary | ICD-10-CM | POA: Diagnosis not present

## 2013-08-22 DIAGNOSIS — Z23 Encounter for immunization: Secondary | ICD-10-CM | POA: Diagnosis not present

## 2013-09-10 DIAGNOSIS — E119 Type 2 diabetes mellitus without complications: Secondary | ICD-10-CM | POA: Diagnosis not present

## 2013-09-10 DIAGNOSIS — H2589 Other age-related cataract: Secondary | ICD-10-CM | POA: Diagnosis not present

## 2013-09-28 DIAGNOSIS — H2510 Age-related nuclear cataract, unspecified eye: Secondary | ICD-10-CM | POA: Diagnosis not present

## 2013-11-06 DIAGNOSIS — I10 Essential (primary) hypertension: Secondary | ICD-10-CM | POA: Diagnosis not present

## 2013-11-06 DIAGNOSIS — H2589 Other age-related cataract: Secondary | ICD-10-CM | POA: Diagnosis not present

## 2013-11-06 DIAGNOSIS — Z79899 Other long term (current) drug therapy: Secondary | ICD-10-CM | POA: Diagnosis not present

## 2013-11-06 DIAGNOSIS — J45909 Unspecified asthma, uncomplicated: Secondary | ICD-10-CM | POA: Diagnosis not present

## 2013-11-06 DIAGNOSIS — K219 Gastro-esophageal reflux disease without esophagitis: Secondary | ICD-10-CM | POA: Diagnosis not present

## 2013-11-06 DIAGNOSIS — H2510 Age-related nuclear cataract, unspecified eye: Secondary | ICD-10-CM | POA: Diagnosis not present

## 2013-11-06 DIAGNOSIS — H269 Unspecified cataract: Secondary | ICD-10-CM | POA: Diagnosis not present

## 2013-11-06 DIAGNOSIS — G4733 Obstructive sleep apnea (adult) (pediatric): Secondary | ICD-10-CM | POA: Diagnosis not present

## 2013-11-06 DIAGNOSIS — E039 Hypothyroidism, unspecified: Secondary | ICD-10-CM | POA: Diagnosis not present

## 2013-11-12 DIAGNOSIS — J019 Acute sinusitis, unspecified: Secondary | ICD-10-CM | POA: Diagnosis not present

## 2013-11-12 DIAGNOSIS — H6120 Impacted cerumen, unspecified ear: Secondary | ICD-10-CM | POA: Diagnosis not present

## 2013-11-12 DIAGNOSIS — J040 Acute laryngitis: Secondary | ICD-10-CM | POA: Diagnosis not present

## 2013-11-12 DIAGNOSIS — J029 Acute pharyngitis, unspecified: Secondary | ICD-10-CM | POA: Diagnosis not present

## 2013-11-27 DIAGNOSIS — J45909 Unspecified asthma, uncomplicated: Secondary | ICD-10-CM | POA: Diagnosis not present

## 2013-11-27 DIAGNOSIS — J310 Chronic rhinitis: Secondary | ICD-10-CM | POA: Diagnosis not present

## 2013-11-27 DIAGNOSIS — G4730 Sleep apnea, unspecified: Secondary | ICD-10-CM | POA: Diagnosis not present

## 2013-11-27 DIAGNOSIS — G4710 Hypersomnia, unspecified: Secondary | ICD-10-CM | POA: Diagnosis not present

## 2013-12-04 DIAGNOSIS — R7989 Other specified abnormal findings of blood chemistry: Secondary | ICD-10-CM | POA: Diagnosis not present

## 2013-12-04 DIAGNOSIS — E039 Hypothyroidism, unspecified: Secondary | ICD-10-CM | POA: Diagnosis not present

## 2013-12-04 DIAGNOSIS — E782 Mixed hyperlipidemia: Secondary | ICD-10-CM | POA: Diagnosis not present

## 2013-12-04 DIAGNOSIS — E559 Vitamin D deficiency, unspecified: Secondary | ICD-10-CM | POA: Diagnosis not present

## 2013-12-04 DIAGNOSIS — E785 Hyperlipidemia, unspecified: Secondary | ICD-10-CM | POA: Diagnosis not present

## 2013-12-04 DIAGNOSIS — Z23 Encounter for immunization: Secondary | ICD-10-CM | POA: Diagnosis not present

## 2013-12-04 DIAGNOSIS — E119 Type 2 diabetes mellitus without complications: Secondary | ICD-10-CM | POA: Diagnosis not present

## 2013-12-04 DIAGNOSIS — I10 Essential (primary) hypertension: Secondary | ICD-10-CM | POA: Diagnosis not present

## 2013-12-04 DIAGNOSIS — J309 Allergic rhinitis, unspecified: Secondary | ICD-10-CM | POA: Diagnosis not present

## 2013-12-18 DIAGNOSIS — E119 Type 2 diabetes mellitus without complications: Secondary | ICD-10-CM | POA: Diagnosis not present

## 2013-12-18 DIAGNOSIS — H269 Unspecified cataract: Secondary | ICD-10-CM | POA: Diagnosis not present

## 2013-12-18 DIAGNOSIS — H2589 Other age-related cataract: Secondary | ICD-10-CM | POA: Diagnosis not present

## 2013-12-18 DIAGNOSIS — J449 Chronic obstructive pulmonary disease, unspecified: Secondary | ICD-10-CM | POA: Diagnosis not present

## 2013-12-18 DIAGNOSIS — H268 Other specified cataract: Secondary | ICD-10-CM | POA: Diagnosis not present

## 2013-12-18 DIAGNOSIS — Z9981 Dependence on supplemental oxygen: Secondary | ICD-10-CM | POA: Diagnosis not present

## 2013-12-18 DIAGNOSIS — G4733 Obstructive sleep apnea (adult) (pediatric): Secondary | ICD-10-CM | POA: Diagnosis not present

## 2013-12-18 DIAGNOSIS — Z79899 Other long term (current) drug therapy: Secondary | ICD-10-CM | POA: Diagnosis not present

## 2013-12-18 DIAGNOSIS — I10 Essential (primary) hypertension: Secondary | ICD-10-CM | POA: Diagnosis not present

## 2014-01-30 DIAGNOSIS — J209 Acute bronchitis, unspecified: Secondary | ICD-10-CM | POA: Diagnosis not present

## 2014-01-30 DIAGNOSIS — J019 Acute sinusitis, unspecified: Secondary | ICD-10-CM | POA: Diagnosis not present

## 2014-01-30 DIAGNOSIS — J040 Acute laryngitis: Secondary | ICD-10-CM | POA: Diagnosis not present

## 2014-01-30 DIAGNOSIS — H68009 Unspecified Eustachian salpingitis, unspecified ear: Secondary | ICD-10-CM | POA: Diagnosis not present

## 2014-02-06 DIAGNOSIS — H524 Presbyopia: Secondary | ICD-10-CM | POA: Diagnosis not present

## 2014-02-26 DIAGNOSIS — J45909 Unspecified asthma, uncomplicated: Secondary | ICD-10-CM | POA: Diagnosis not present

## 2014-02-26 DIAGNOSIS — G4710 Hypersomnia, unspecified: Secondary | ICD-10-CM | POA: Diagnosis not present

## 2014-02-26 DIAGNOSIS — J310 Chronic rhinitis: Secondary | ICD-10-CM | POA: Diagnosis not present

## 2014-02-26 DIAGNOSIS — G4730 Sleep apnea, unspecified: Secondary | ICD-10-CM | POA: Diagnosis not present

## 2014-03-05 DIAGNOSIS — I10 Essential (primary) hypertension: Secondary | ICD-10-CM | POA: Diagnosis not present

## 2014-03-05 DIAGNOSIS — E039 Hypothyroidism, unspecified: Secondary | ICD-10-CM | POA: Diagnosis not present

## 2014-03-05 DIAGNOSIS — E119 Type 2 diabetes mellitus without complications: Secondary | ICD-10-CM | POA: Diagnosis not present

## 2014-03-05 DIAGNOSIS — R7989 Other specified abnormal findings of blood chemistry: Secondary | ICD-10-CM | POA: Diagnosis not present

## 2014-03-19 DIAGNOSIS — L819 Disorder of pigmentation, unspecified: Secondary | ICD-10-CM | POA: Diagnosis not present

## 2014-03-19 DIAGNOSIS — L570 Actinic keratosis: Secondary | ICD-10-CM | POA: Diagnosis not present

## 2014-05-01 DIAGNOSIS — L989 Disorder of the skin and subcutaneous tissue, unspecified: Secondary | ICD-10-CM | POA: Diagnosis not present

## 2014-05-01 DIAGNOSIS — R21 Rash and other nonspecific skin eruption: Secondary | ICD-10-CM | POA: Diagnosis not present

## 2014-05-01 DIAGNOSIS — B029 Zoster without complications: Secondary | ICD-10-CM | POA: Diagnosis not present

## 2014-05-14 DIAGNOSIS — Z0000 Encounter for general adult medical examination without abnormal findings: Secondary | ICD-10-CM | POA: Diagnosis not present

## 2014-05-14 DIAGNOSIS — E782 Mixed hyperlipidemia: Secondary | ICD-10-CM | POA: Diagnosis not present

## 2014-05-14 DIAGNOSIS — Z1212 Encounter for screening for malignant neoplasm of rectum: Secondary | ICD-10-CM | POA: Diagnosis not present

## 2014-05-14 DIAGNOSIS — I10 Essential (primary) hypertension: Secondary | ICD-10-CM | POA: Diagnosis not present

## 2014-05-14 DIAGNOSIS — E119 Type 2 diabetes mellitus without complications: Secondary | ICD-10-CM | POA: Diagnosis not present

## 2014-06-03 DIAGNOSIS — E119 Type 2 diabetes mellitus without complications: Secondary | ICD-10-CM | POA: Diagnosis not present

## 2014-06-10 DIAGNOSIS — E119 Type 2 diabetes mellitus without complications: Secondary | ICD-10-CM | POA: Diagnosis not present

## 2014-06-18 DIAGNOSIS — E119 Type 2 diabetes mellitus without complications: Secondary | ICD-10-CM | POA: Diagnosis not present

## 2014-06-26 DIAGNOSIS — L259 Unspecified contact dermatitis, unspecified cause: Secondary | ICD-10-CM | POA: Diagnosis not present

## 2014-06-26 DIAGNOSIS — B351 Tinea unguium: Secondary | ICD-10-CM | POA: Diagnosis not present

## 2014-06-28 DIAGNOSIS — Z1231 Encounter for screening mammogram for malignant neoplasm of breast: Secondary | ICD-10-CM | POA: Diagnosis not present

## 2014-06-28 DIAGNOSIS — Z13820 Encounter for screening for osteoporosis: Secondary | ICD-10-CM | POA: Diagnosis not present

## 2014-07-22 DIAGNOSIS — D2370 Other benign neoplasm of skin of unspecified lower limb, including hip: Secondary | ICD-10-CM | POA: Diagnosis not present

## 2014-07-22 DIAGNOSIS — B353 Tinea pedis: Secondary | ICD-10-CM | POA: Diagnosis not present

## 2014-07-22 DIAGNOSIS — B351 Tinea unguium: Secondary | ICD-10-CM | POA: Diagnosis not present

## 2014-08-22 DIAGNOSIS — E785 Hyperlipidemia, unspecified: Secondary | ICD-10-CM | POA: Diagnosis not present

## 2014-08-22 DIAGNOSIS — K219 Gastro-esophageal reflux disease without esophagitis: Secondary | ICD-10-CM | POA: Diagnosis not present

## 2014-08-22 DIAGNOSIS — E039 Hypothyroidism, unspecified: Secondary | ICD-10-CM | POA: Diagnosis not present

## 2014-08-22 DIAGNOSIS — E119 Type 2 diabetes mellitus without complications: Secondary | ICD-10-CM | POA: Diagnosis not present

## 2014-08-22 DIAGNOSIS — Z6835 Body mass index (BMI) 35.0-35.9, adult: Secondary | ICD-10-CM | POA: Diagnosis not present

## 2014-08-22 DIAGNOSIS — J449 Chronic obstructive pulmonary disease, unspecified: Secondary | ICD-10-CM | POA: Diagnosis not present

## 2014-08-22 DIAGNOSIS — I10 Essential (primary) hypertension: Secondary | ICD-10-CM | POA: Diagnosis not present

## 2014-08-22 DIAGNOSIS — E559 Vitamin D deficiency, unspecified: Secondary | ICD-10-CM | POA: Diagnosis not present

## 2014-08-22 DIAGNOSIS — Z79899 Other long term (current) drug therapy: Secondary | ICD-10-CM | POA: Diagnosis not present

## 2014-08-26 DIAGNOSIS — J4530 Mild persistent asthma, uncomplicated: Secondary | ICD-10-CM | POA: Diagnosis not present

## 2014-08-26 DIAGNOSIS — J310 Chronic rhinitis: Secondary | ICD-10-CM | POA: Diagnosis not present

## 2014-08-26 DIAGNOSIS — G4733 Obstructive sleep apnea (adult) (pediatric): Secondary | ICD-10-CM | POA: Diagnosis not present

## 2014-09-10 DIAGNOSIS — B351 Tinea unguium: Secondary | ICD-10-CM | POA: Diagnosis not present

## 2014-09-10 DIAGNOSIS — L304 Erythema intertrigo: Secondary | ICD-10-CM | POA: Diagnosis not present

## 2014-10-09 DIAGNOSIS — H3530 Unspecified macular degeneration: Secondary | ICD-10-CM | POA: Diagnosis not present

## 2014-10-09 DIAGNOSIS — Z961 Presence of intraocular lens: Secondary | ICD-10-CM | POA: Diagnosis not present

## 2014-10-25 HISTORY — PX: CATARACT EXTRACTION, BILATERAL: SHX1313

## 2014-11-25 DIAGNOSIS — H3531 Nonexudative age-related macular degeneration: Secondary | ICD-10-CM | POA: Diagnosis not present

## 2014-11-25 DIAGNOSIS — H35351 Cystoid macular degeneration, right eye: Secondary | ICD-10-CM | POA: Diagnosis not present

## 2014-11-25 DIAGNOSIS — H35341 Macular cyst, hole, or pseudohole, right eye: Secondary | ICD-10-CM | POA: Diagnosis not present

## 2014-11-25 DIAGNOSIS — H35373 Puckering of macula, bilateral: Secondary | ICD-10-CM | POA: Diagnosis not present

## 2014-12-16 DIAGNOSIS — B372 Candidiasis of skin and nail: Secondary | ICD-10-CM | POA: Diagnosis not present

## 2014-12-16 DIAGNOSIS — I10 Essential (primary) hypertension: Secondary | ICD-10-CM | POA: Diagnosis not present

## 2014-12-16 DIAGNOSIS — E039 Hypothyroidism, unspecified: Secondary | ICD-10-CM | POA: Diagnosis not present

## 2014-12-16 DIAGNOSIS — E785 Hyperlipidemia, unspecified: Secondary | ICD-10-CM | POA: Diagnosis not present

## 2014-12-16 DIAGNOSIS — L309 Dermatitis, unspecified: Secondary | ICD-10-CM | POA: Diagnosis not present

## 2014-12-16 DIAGNOSIS — J309 Allergic rhinitis, unspecified: Secondary | ICD-10-CM | POA: Diagnosis not present

## 2014-12-16 DIAGNOSIS — E559 Vitamin D deficiency, unspecified: Secondary | ICD-10-CM | POA: Diagnosis not present

## 2014-12-16 DIAGNOSIS — Z79899 Other long term (current) drug therapy: Secondary | ICD-10-CM | POA: Diagnosis not present

## 2014-12-16 DIAGNOSIS — Z6837 Body mass index (BMI) 37.0-37.9, adult: Secondary | ICD-10-CM | POA: Diagnosis not present

## 2014-12-16 DIAGNOSIS — E119 Type 2 diabetes mellitus without complications: Secondary | ICD-10-CM | POA: Diagnosis not present

## 2014-12-16 DIAGNOSIS — J449 Chronic obstructive pulmonary disease, unspecified: Secondary | ICD-10-CM | POA: Diagnosis not present

## 2014-12-24 DIAGNOSIS — K8080 Other cholelithiasis without obstruction: Secondary | ICD-10-CM | POA: Diagnosis not present

## 2014-12-24 DIAGNOSIS — K8020 Calculus of gallbladder without cholecystitis without obstruction: Secondary | ICD-10-CM | POA: Diagnosis not present

## 2014-12-24 DIAGNOSIS — K760 Fatty (change of) liver, not elsewhere classified: Secondary | ICD-10-CM | POA: Diagnosis not present

## 2014-12-24 DIAGNOSIS — R945 Abnormal results of liver function studies: Secondary | ICD-10-CM | POA: Diagnosis not present

## 2015-01-08 DIAGNOSIS — K769 Liver disease, unspecified: Secondary | ICD-10-CM | POA: Diagnosis not present

## 2015-01-16 DIAGNOSIS — R7989 Other specified abnormal findings of blood chemistry: Secondary | ICD-10-CM | POA: Diagnosis not present

## 2015-02-04 DIAGNOSIS — R740 Nonspecific elevation of levels of transaminase and lactic acid dehydrogenase [LDH]: Secondary | ICD-10-CM | POA: Diagnosis not present

## 2015-02-04 DIAGNOSIS — R748 Abnormal levels of other serum enzymes: Secondary | ICD-10-CM | POA: Diagnosis not present

## 2015-02-24 DIAGNOSIS — G4733 Obstructive sleep apnea (adult) (pediatric): Secondary | ICD-10-CM | POA: Diagnosis not present

## 2015-02-24 DIAGNOSIS — J4520 Mild intermittent asthma, uncomplicated: Secondary | ICD-10-CM | POA: Diagnosis not present

## 2015-02-24 DIAGNOSIS — J310 Chronic rhinitis: Secondary | ICD-10-CM | POA: Diagnosis not present

## 2015-04-09 DIAGNOSIS — I10 Essential (primary) hypertension: Secondary | ICD-10-CM | POA: Diagnosis not present

## 2015-04-09 DIAGNOSIS — Z79899 Other long term (current) drug therapy: Secondary | ICD-10-CM | POA: Diagnosis not present

## 2015-04-09 DIAGNOSIS — Z1389 Encounter for screening for other disorder: Secondary | ICD-10-CM | POA: Diagnosis not present

## 2015-04-09 DIAGNOSIS — R21 Rash and other nonspecific skin eruption: Secondary | ICD-10-CM | POA: Diagnosis not present

## 2015-04-09 DIAGNOSIS — E119 Type 2 diabetes mellitus without complications: Secondary | ICD-10-CM | POA: Diagnosis not present

## 2015-04-09 DIAGNOSIS — Z9181 History of falling: Secondary | ICD-10-CM | POA: Diagnosis not present

## 2015-04-09 DIAGNOSIS — E785 Hyperlipidemia, unspecified: Secondary | ICD-10-CM | POA: Diagnosis not present

## 2015-04-09 DIAGNOSIS — E559 Vitamin D deficiency, unspecified: Secondary | ICD-10-CM | POA: Diagnosis not present

## 2015-04-09 DIAGNOSIS — E039 Hypothyroidism, unspecified: Secondary | ICD-10-CM | POA: Diagnosis not present

## 2015-04-09 DIAGNOSIS — J309 Allergic rhinitis, unspecified: Secondary | ICD-10-CM | POA: Diagnosis not present

## 2015-04-21 DIAGNOSIS — H3531 Nonexudative age-related macular degeneration: Secondary | ICD-10-CM | POA: Diagnosis not present

## 2015-04-22 DIAGNOSIS — R740 Nonspecific elevation of levels of transaminase and lactic acid dehydrogenase [LDH]: Secondary | ICD-10-CM | POA: Diagnosis not present

## 2015-04-22 DIAGNOSIS — R790 Abnormal level of blood mineral: Secondary | ICD-10-CM | POA: Diagnosis not present

## 2015-05-02 DIAGNOSIS — E611 Iron deficiency: Secondary | ICD-10-CM | POA: Diagnosis not present

## 2015-05-06 DIAGNOSIS — R748 Abnormal levels of other serum enzymes: Secondary | ICD-10-CM | POA: Diagnosis not present

## 2015-05-06 DIAGNOSIS — R195 Other fecal abnormalities: Secondary | ICD-10-CM | POA: Diagnosis not present

## 2015-05-08 DIAGNOSIS — J4540 Moderate persistent asthma, uncomplicated: Secondary | ICD-10-CM | POA: Diagnosis not present

## 2015-05-08 DIAGNOSIS — L259 Unspecified contact dermatitis, unspecified cause: Secondary | ICD-10-CM | POA: Diagnosis not present

## 2015-05-08 DIAGNOSIS — L509 Urticaria, unspecified: Secondary | ICD-10-CM | POA: Diagnosis not present

## 2015-05-08 DIAGNOSIS — J309 Allergic rhinitis, unspecified: Secondary | ICD-10-CM | POA: Diagnosis not present

## 2015-05-08 DIAGNOSIS — T7840XA Allergy, unspecified, initial encounter: Secondary | ICD-10-CM | POA: Diagnosis not present

## 2015-05-12 DIAGNOSIS — K317 Polyp of stomach and duodenum: Secondary | ICD-10-CM | POA: Diagnosis not present

## 2015-05-12 DIAGNOSIS — K2960 Other gastritis without bleeding: Secondary | ICD-10-CM | POA: Diagnosis not present

## 2015-05-12 DIAGNOSIS — R195 Other fecal abnormalities: Secondary | ICD-10-CM | POA: Diagnosis not present

## 2015-05-12 DIAGNOSIS — K3189 Other diseases of stomach and duodenum: Secondary | ICD-10-CM | POA: Diagnosis not present

## 2015-05-12 DIAGNOSIS — D500 Iron deficiency anemia secondary to blood loss (chronic): Secondary | ICD-10-CM | POA: Diagnosis not present

## 2015-05-12 DIAGNOSIS — K2950 Unspecified chronic gastritis without bleeding: Secondary | ICD-10-CM | POA: Diagnosis not present

## 2015-05-12 DIAGNOSIS — D128 Benign neoplasm of rectum: Secondary | ICD-10-CM | POA: Diagnosis not present

## 2015-06-05 DIAGNOSIS — R748 Abnormal levels of other serum enzymes: Secondary | ICD-10-CM | POA: Diagnosis not present

## 2015-06-12 DIAGNOSIS — R195 Other fecal abnormalities: Secondary | ICD-10-CM | POA: Diagnosis not present

## 2015-06-12 DIAGNOSIS — K317 Polyp of stomach and duodenum: Secondary | ICD-10-CM | POA: Diagnosis not present

## 2015-06-12 DIAGNOSIS — K8020 Calculus of gallbladder without cholecystitis without obstruction: Secondary | ICD-10-CM | POA: Diagnosis not present

## 2015-06-12 DIAGNOSIS — K760 Fatty (change of) liver, not elsewhere classified: Secondary | ICD-10-CM | POA: Diagnosis not present

## 2015-06-17 DIAGNOSIS — N3001 Acute cystitis with hematuria: Secondary | ICD-10-CM | POA: Diagnosis not present

## 2015-06-17 DIAGNOSIS — E1165 Type 2 diabetes mellitus with hyperglycemia: Secondary | ICD-10-CM | POA: Diagnosis not present

## 2015-06-17 DIAGNOSIS — J209 Acute bronchitis, unspecified: Secondary | ICD-10-CM | POA: Diagnosis not present

## 2015-06-17 DIAGNOSIS — N3090 Cystitis, unspecified without hematuria: Secondary | ICD-10-CM | POA: Diagnosis not present

## 2015-07-09 DIAGNOSIS — J189 Pneumonia, unspecified organism: Secondary | ICD-10-CM | POA: Diagnosis not present

## 2015-07-28 DIAGNOSIS — H35341 Macular cyst, hole, or pseudohole, right eye: Secondary | ICD-10-CM | POA: Diagnosis not present

## 2015-07-28 DIAGNOSIS — H35373 Puckering of macula, bilateral: Secondary | ICD-10-CM | POA: Diagnosis not present

## 2015-07-31 DIAGNOSIS — S8392XA Sprain of unspecified site of left knee, initial encounter: Secondary | ICD-10-CM | POA: Diagnosis not present

## 2015-07-31 DIAGNOSIS — M25562 Pain in left knee: Secondary | ICD-10-CM | POA: Diagnosis not present

## 2015-08-04 DIAGNOSIS — E039 Hypothyroidism, unspecified: Secondary | ICD-10-CM | POA: Diagnosis not present

## 2015-08-04 DIAGNOSIS — Z6834 Body mass index (BMI) 34.0-34.9, adult: Secondary | ICD-10-CM | POA: Diagnosis not present

## 2015-08-04 DIAGNOSIS — E119 Type 2 diabetes mellitus without complications: Secondary | ICD-10-CM | POA: Diagnosis not present

## 2015-08-04 DIAGNOSIS — Z79899 Other long term (current) drug therapy: Secondary | ICD-10-CM | POA: Diagnosis not present

## 2015-08-04 DIAGNOSIS — Z09 Encounter for follow-up examination after completed treatment for conditions other than malignant neoplasm: Secondary | ICD-10-CM | POA: Diagnosis not present

## 2015-08-04 DIAGNOSIS — J209 Acute bronchitis, unspecified: Secondary | ICD-10-CM | POA: Diagnosis not present

## 2015-08-04 DIAGNOSIS — I10 Essential (primary) hypertension: Secondary | ICD-10-CM | POA: Diagnosis not present

## 2015-08-11 DIAGNOSIS — M1712 Unilateral primary osteoarthritis, left knee: Secondary | ICD-10-CM | POA: Diagnosis not present

## 2015-08-12 DIAGNOSIS — K760 Fatty (change of) liver, not elsewhere classified: Secondary | ICD-10-CM | POA: Diagnosis not present

## 2015-08-12 DIAGNOSIS — R195 Other fecal abnormalities: Secondary | ICD-10-CM | POA: Diagnosis not present

## 2015-08-13 DIAGNOSIS — M25562 Pain in left knee: Secondary | ICD-10-CM | POA: Diagnosis not present

## 2015-08-18 DIAGNOSIS — M25562 Pain in left knee: Secondary | ICD-10-CM | POA: Diagnosis not present

## 2015-08-22 DIAGNOSIS — Z1231 Encounter for screening mammogram for malignant neoplasm of breast: Secondary | ICD-10-CM | POA: Diagnosis not present

## 2015-08-22 DIAGNOSIS — M25562 Pain in left knee: Secondary | ICD-10-CM | POA: Diagnosis not present

## 2015-08-25 DIAGNOSIS — M25562 Pain in left knee: Secondary | ICD-10-CM | POA: Diagnosis not present

## 2015-08-26 DIAGNOSIS — Z6834 Body mass index (BMI) 34.0-34.9, adult: Secondary | ICD-10-CM | POA: Diagnosis not present

## 2015-08-26 DIAGNOSIS — E663 Overweight: Secondary | ICD-10-CM | POA: Diagnosis not present

## 2015-08-26 DIAGNOSIS — Z23 Encounter for immunization: Secondary | ICD-10-CM | POA: Diagnosis not present

## 2015-08-26 DIAGNOSIS — R7989 Other specified abnormal findings of blood chemistry: Secondary | ICD-10-CM | POA: Diagnosis not present

## 2015-08-26 DIAGNOSIS — E119 Type 2 diabetes mellitus without complications: Secondary | ICD-10-CM | POA: Diagnosis not present

## 2015-08-26 DIAGNOSIS — Z0000 Encounter for general adult medical examination without abnormal findings: Secondary | ICD-10-CM | POA: Diagnosis not present

## 2015-08-26 DIAGNOSIS — Z1212 Encounter for screening for malignant neoplasm of rectum: Secondary | ICD-10-CM | POA: Diagnosis not present

## 2015-08-26 DIAGNOSIS — Z1211 Encounter for screening for malignant neoplasm of colon: Secondary | ICD-10-CM | POA: Diagnosis not present

## 2015-08-26 DIAGNOSIS — I10 Essential (primary) hypertension: Secondary | ICD-10-CM | POA: Diagnosis not present

## 2015-08-27 DIAGNOSIS — M25562 Pain in left knee: Secondary | ICD-10-CM | POA: Diagnosis not present

## 2015-08-27 DIAGNOSIS — J4520 Mild intermittent asthma, uncomplicated: Secondary | ICD-10-CM | POA: Diagnosis not present

## 2015-08-27 DIAGNOSIS — J310 Chronic rhinitis: Secondary | ICD-10-CM | POA: Diagnosis not present

## 2015-08-27 DIAGNOSIS — G4733 Obstructive sleep apnea (adult) (pediatric): Secondary | ICD-10-CM | POA: Diagnosis not present

## 2015-09-16 DIAGNOSIS — K760 Fatty (change of) liver, not elsewhere classified: Secondary | ICD-10-CM | POA: Diagnosis not present

## 2015-09-16 DIAGNOSIS — R1013 Epigastric pain: Secondary | ICD-10-CM | POA: Diagnosis not present

## 2015-09-16 DIAGNOSIS — R195 Other fecal abnormalities: Secondary | ICD-10-CM | POA: Diagnosis not present

## 2015-09-24 DIAGNOSIS — G4733 Obstructive sleep apnea (adult) (pediatric): Secondary | ICD-10-CM | POA: Diagnosis not present

## 2015-09-24 DIAGNOSIS — K8010 Calculus of gallbladder with chronic cholecystitis without obstruction: Secondary | ICD-10-CM | POA: Diagnosis not present

## 2015-09-24 DIAGNOSIS — K760 Fatty (change of) liver, not elsewhere classified: Secondary | ICD-10-CM | POA: Diagnosis not present

## 2015-10-01 DIAGNOSIS — H81399 Other peripheral vertigo, unspecified ear: Secondary | ICD-10-CM | POA: Diagnosis not present

## 2015-10-01 DIAGNOSIS — H8309 Labyrinthitis, unspecified ear: Secondary | ICD-10-CM | POA: Diagnosis not present

## 2015-10-01 DIAGNOSIS — E669 Obesity, unspecified: Secondary | ICD-10-CM | POA: Diagnosis not present

## 2015-10-01 DIAGNOSIS — J309 Allergic rhinitis, unspecified: Secondary | ICD-10-CM | POA: Diagnosis not present

## 2015-10-01 DIAGNOSIS — Z6834 Body mass index (BMI) 34.0-34.9, adult: Secondary | ICD-10-CM | POA: Diagnosis not present

## 2015-10-26 HISTORY — PX: CHOLECYSTECTOMY: SHX55

## 2015-10-29 DIAGNOSIS — Z6834 Body mass index (BMI) 34.0-34.9, adult: Secondary | ICD-10-CM | POA: Diagnosis not present

## 2015-10-29 DIAGNOSIS — N390 Urinary tract infection, site not specified: Secondary | ICD-10-CM | POA: Diagnosis not present

## 2015-10-29 DIAGNOSIS — J309 Allergic rhinitis, unspecified: Secondary | ICD-10-CM | POA: Diagnosis not present

## 2015-10-29 DIAGNOSIS — R300 Dysuria: Secondary | ICD-10-CM | POA: Diagnosis not present

## 2015-11-10 DIAGNOSIS — H81399 Other peripheral vertigo, unspecified ear: Secondary | ICD-10-CM | POA: Diagnosis not present

## 2015-11-10 DIAGNOSIS — H8309 Labyrinthitis, unspecified ear: Secondary | ICD-10-CM | POA: Diagnosis not present

## 2015-11-17 DIAGNOSIS — H81399 Other peripheral vertigo, unspecified ear: Secondary | ICD-10-CM | POA: Diagnosis not present

## 2015-11-17 DIAGNOSIS — H8309 Labyrinthitis, unspecified ear: Secondary | ICD-10-CM | POA: Diagnosis not present

## 2015-11-21 DIAGNOSIS — H8309 Labyrinthitis, unspecified ear: Secondary | ICD-10-CM | POA: Diagnosis not present

## 2015-11-21 DIAGNOSIS — H81399 Other peripheral vertigo, unspecified ear: Secondary | ICD-10-CM | POA: Diagnosis not present

## 2015-12-04 DIAGNOSIS — G4733 Obstructive sleep apnea (adult) (pediatric): Secondary | ICD-10-CM | POA: Insufficient documentation

## 2015-12-04 DIAGNOSIS — K8010 Calculus of gallbladder with chronic cholecystitis without obstruction: Secondary | ICD-10-CM | POA: Insufficient documentation

## 2015-12-04 HISTORY — DX: Calculus of gallbladder with chronic cholecystitis without obstruction: K80.10

## 2015-12-04 HISTORY — DX: Obstructive sleep apnea (adult) (pediatric): G47.33

## 2015-12-10 DIAGNOSIS — J310 Chronic rhinitis: Secondary | ICD-10-CM | POA: Diagnosis not present

## 2015-12-10 DIAGNOSIS — G4733 Obstructive sleep apnea (adult) (pediatric): Secondary | ICD-10-CM | POA: Diagnosis not present

## 2015-12-10 DIAGNOSIS — J4520 Mild intermittent asthma, uncomplicated: Secondary | ICD-10-CM | POA: Diagnosis not present

## 2015-12-12 DIAGNOSIS — E119 Type 2 diabetes mellitus without complications: Secondary | ICD-10-CM | POA: Diagnosis not present

## 2015-12-12 DIAGNOSIS — K8020 Calculus of gallbladder without cholecystitis without obstruction: Secondary | ICD-10-CM | POA: Diagnosis not present

## 2015-12-12 DIAGNOSIS — K219 Gastro-esophageal reflux disease without esophagitis: Secondary | ICD-10-CM | POA: Diagnosis not present

## 2015-12-12 DIAGNOSIS — K828 Other specified diseases of gallbladder: Secondary | ICD-10-CM | POA: Diagnosis not present

## 2015-12-12 DIAGNOSIS — G629 Polyneuropathy, unspecified: Secondary | ICD-10-CM | POA: Diagnosis not present

## 2015-12-12 DIAGNOSIS — I10 Essential (primary) hypertension: Secondary | ICD-10-CM | POA: Diagnosis not present

## 2015-12-12 DIAGNOSIS — K660 Peritoneal adhesions (postprocedural) (postinfection): Secondary | ICD-10-CM | POA: Diagnosis not present

## 2015-12-12 DIAGNOSIS — K8010 Calculus of gallbladder with chronic cholecystitis without obstruction: Secondary | ICD-10-CM | POA: Diagnosis not present

## 2015-12-12 DIAGNOSIS — J45909 Unspecified asthma, uncomplicated: Secondary | ICD-10-CM | POA: Diagnosis not present

## 2015-12-12 DIAGNOSIS — J449 Chronic obstructive pulmonary disease, unspecified: Secondary | ICD-10-CM | POA: Diagnosis not present

## 2015-12-12 DIAGNOSIS — Z79899 Other long term (current) drug therapy: Secondary | ICD-10-CM | POA: Diagnosis not present

## 2015-12-12 DIAGNOSIS — G4733 Obstructive sleep apnea (adult) (pediatric): Secondary | ICD-10-CM | POA: Diagnosis not present

## 2015-12-12 DIAGNOSIS — K760 Fatty (change of) liver, not elsewhere classified: Secondary | ICD-10-CM | POA: Diagnosis not present

## 2015-12-12 DIAGNOSIS — K739 Chronic hepatitis, unspecified: Secondary | ICD-10-CM | POA: Diagnosis not present

## 2015-12-12 DIAGNOSIS — K7581 Nonalcoholic steatohepatitis (NASH): Secondary | ICD-10-CM | POA: Diagnosis not present

## 2015-12-13 DIAGNOSIS — K8010 Calculus of gallbladder with chronic cholecystitis without obstruction: Secondary | ICD-10-CM | POA: Diagnosis not present

## 2015-12-13 DIAGNOSIS — E119 Type 2 diabetes mellitus without complications: Secondary | ICD-10-CM | POA: Diagnosis not present

## 2015-12-13 DIAGNOSIS — G629 Polyneuropathy, unspecified: Secondary | ICD-10-CM | POA: Diagnosis not present

## 2015-12-13 DIAGNOSIS — K828 Other specified diseases of gallbladder: Secondary | ICD-10-CM | POA: Diagnosis not present

## 2015-12-13 DIAGNOSIS — K660 Peritoneal adhesions (postprocedural) (postinfection): Secondary | ICD-10-CM | POA: Diagnosis not present

## 2015-12-13 DIAGNOSIS — K7581 Nonalcoholic steatohepatitis (NASH): Secondary | ICD-10-CM | POA: Diagnosis not present

## 2015-12-22 DIAGNOSIS — K760 Fatty (change of) liver, not elsewhere classified: Secondary | ICD-10-CM | POA: Diagnosis not present

## 2015-12-30 DIAGNOSIS — K7581 Nonalcoholic steatohepatitis (NASH): Secondary | ICD-10-CM | POA: Diagnosis not present

## 2016-01-14 DIAGNOSIS — J449 Chronic obstructive pulmonary disease, unspecified: Secondary | ICD-10-CM | POA: Diagnosis not present

## 2016-01-14 DIAGNOSIS — J309 Allergic rhinitis, unspecified: Secondary | ICD-10-CM | POA: Diagnosis not present

## 2016-01-14 DIAGNOSIS — I10 Essential (primary) hypertension: Secondary | ICD-10-CM | POA: Diagnosis not present

## 2016-01-14 DIAGNOSIS — E785 Hyperlipidemia, unspecified: Secondary | ICD-10-CM | POA: Diagnosis not present

## 2016-01-14 DIAGNOSIS — K219 Gastro-esophageal reflux disease without esophagitis: Secondary | ICD-10-CM | POA: Diagnosis not present

## 2016-01-14 DIAGNOSIS — E119 Type 2 diabetes mellitus without complications: Secondary | ICD-10-CM | POA: Diagnosis not present

## 2016-01-14 DIAGNOSIS — G4730 Sleep apnea, unspecified: Secondary | ICD-10-CM | POA: Diagnosis not present

## 2016-01-14 DIAGNOSIS — E669 Obesity, unspecified: Secondary | ICD-10-CM | POA: Diagnosis not present

## 2016-01-14 DIAGNOSIS — Z6835 Body mass index (BMI) 35.0-35.9, adult: Secondary | ICD-10-CM | POA: Diagnosis not present

## 2016-01-14 DIAGNOSIS — E039 Hypothyroidism, unspecified: Secondary | ICD-10-CM | POA: Diagnosis not present

## 2016-01-14 DIAGNOSIS — Z1389 Encounter for screening for other disorder: Secondary | ICD-10-CM | POA: Diagnosis not present

## 2016-01-14 DIAGNOSIS — Z79899 Other long term (current) drug therapy: Secondary | ICD-10-CM | POA: Diagnosis not present

## 2016-01-15 DIAGNOSIS — E119 Type 2 diabetes mellitus without complications: Secondary | ICD-10-CM | POA: Insufficient documentation

## 2016-01-15 DIAGNOSIS — E088 Diabetes mellitus due to underlying condition with unspecified complications: Secondary | ICD-10-CM | POA: Insufficient documentation

## 2016-01-15 DIAGNOSIS — B353 Tinea pedis: Secondary | ICD-10-CM

## 2016-01-15 HISTORY — DX: Diabetes mellitus due to underlying condition with unspecified complications: E08.8

## 2016-01-15 HISTORY — DX: Type 2 diabetes mellitus without complications: E11.9

## 2016-01-15 HISTORY — DX: Tinea pedis: B35.3

## 2016-01-29 DIAGNOSIS — B353 Tinea pedis: Secondary | ICD-10-CM | POA: Diagnosis not present

## 2016-03-17 DIAGNOSIS — G4733 Obstructive sleep apnea (adult) (pediatric): Secondary | ICD-10-CM | POA: Diagnosis not present

## 2016-03-24 DIAGNOSIS — G4733 Obstructive sleep apnea (adult) (pediatric): Secondary | ICD-10-CM | POA: Diagnosis not present

## 2016-03-24 DIAGNOSIS — J4520 Mild intermittent asthma, uncomplicated: Secondary | ICD-10-CM | POA: Diagnosis not present

## 2016-03-24 DIAGNOSIS — J310 Chronic rhinitis: Secondary | ICD-10-CM | POA: Diagnosis not present

## 2016-05-18 DIAGNOSIS — Z9181 History of falling: Secondary | ICD-10-CM | POA: Diagnosis not present

## 2016-05-18 DIAGNOSIS — E039 Hypothyroidism, unspecified: Secondary | ICD-10-CM | POA: Diagnosis not present

## 2016-05-18 DIAGNOSIS — E119 Type 2 diabetes mellitus without complications: Secondary | ICD-10-CM | POA: Diagnosis not present

## 2016-05-18 DIAGNOSIS — Z79899 Other long term (current) drug therapy: Secondary | ICD-10-CM | POA: Diagnosis not present

## 2016-05-18 DIAGNOSIS — Z6836 Body mass index (BMI) 36.0-36.9, adult: Secondary | ICD-10-CM | POA: Diagnosis not present

## 2016-05-18 DIAGNOSIS — E785 Hyperlipidemia, unspecified: Secondary | ICD-10-CM | POA: Diagnosis not present

## 2016-05-18 DIAGNOSIS — K219 Gastro-esophageal reflux disease without esophagitis: Secondary | ICD-10-CM | POA: Diagnosis not present

## 2016-05-18 DIAGNOSIS — E559 Vitamin D deficiency, unspecified: Secondary | ICD-10-CM | POA: Diagnosis not present

## 2016-05-18 DIAGNOSIS — J309 Allergic rhinitis, unspecified: Secondary | ICD-10-CM | POA: Diagnosis not present

## 2016-05-18 DIAGNOSIS — I10 Essential (primary) hypertension: Secondary | ICD-10-CM | POA: Diagnosis not present

## 2016-06-11 DIAGNOSIS — J449 Chronic obstructive pulmonary disease, unspecified: Secondary | ICD-10-CM | POA: Diagnosis not present

## 2016-06-11 DIAGNOSIS — Z6836 Body mass index (BMI) 36.0-36.9, adult: Secondary | ICD-10-CM | POA: Diagnosis not present

## 2016-06-11 DIAGNOSIS — J309 Allergic rhinitis, unspecified: Secondary | ICD-10-CM | POA: Diagnosis not present

## 2016-06-11 DIAGNOSIS — J069 Acute upper respiratory infection, unspecified: Secondary | ICD-10-CM | POA: Diagnosis not present

## 2016-07-12 DIAGNOSIS — R748 Abnormal levels of other serum enzymes: Secondary | ICD-10-CM | POA: Diagnosis not present

## 2016-07-30 DIAGNOSIS — H35341 Macular cyst, hole, or pseudohole, right eye: Secondary | ICD-10-CM | POA: Diagnosis not present

## 2016-07-30 DIAGNOSIS — H35373 Puckering of macula, bilateral: Secondary | ICD-10-CM | POA: Diagnosis not present

## 2016-09-14 DIAGNOSIS — Z6836 Body mass index (BMI) 36.0-36.9, adult: Secondary | ICD-10-CM | POA: Diagnosis not present

## 2016-09-14 DIAGNOSIS — I10 Essential (primary) hypertension: Secondary | ICD-10-CM | POA: Diagnosis not present

## 2016-09-14 DIAGNOSIS — Z0000 Encounter for general adult medical examination without abnormal findings: Secondary | ICD-10-CM | POA: Diagnosis not present

## 2016-09-14 DIAGNOSIS — E785 Hyperlipidemia, unspecified: Secondary | ICD-10-CM | POA: Diagnosis not present

## 2016-09-14 DIAGNOSIS — J309 Allergic rhinitis, unspecified: Secondary | ICD-10-CM | POA: Diagnosis not present

## 2016-09-14 DIAGNOSIS — E669 Obesity, unspecified: Secondary | ICD-10-CM | POA: Diagnosis not present

## 2016-09-14 DIAGNOSIS — Z1231 Encounter for screening mammogram for malignant neoplasm of breast: Secondary | ICD-10-CM | POA: Diagnosis not present

## 2016-09-14 DIAGNOSIS — Z79899 Other long term (current) drug therapy: Secondary | ICD-10-CM | POA: Diagnosis not present

## 2016-09-14 DIAGNOSIS — E2839 Other primary ovarian failure: Secondary | ICD-10-CM | POA: Diagnosis not present

## 2016-09-14 DIAGNOSIS — I2510 Atherosclerotic heart disease of native coronary artery without angina pectoris: Secondary | ICD-10-CM | POA: Diagnosis not present

## 2016-09-14 DIAGNOSIS — E119 Type 2 diabetes mellitus without complications: Secondary | ICD-10-CM | POA: Diagnosis not present

## 2016-09-14 DIAGNOSIS — E039 Hypothyroidism, unspecified: Secondary | ICD-10-CM | POA: Diagnosis not present

## 2016-09-20 DIAGNOSIS — Z1231 Encounter for screening mammogram for malignant neoplasm of breast: Secondary | ICD-10-CM | POA: Diagnosis not present

## 2016-09-20 DIAGNOSIS — Z13820 Encounter for screening for osteoporosis: Secondary | ICD-10-CM | POA: Diagnosis not present

## 2016-09-20 DIAGNOSIS — E2839 Other primary ovarian failure: Secondary | ICD-10-CM | POA: Diagnosis not present

## 2016-09-20 DIAGNOSIS — Z96643 Presence of artificial hip joint, bilateral: Secondary | ICD-10-CM | POA: Diagnosis not present

## 2016-09-23 DIAGNOSIS — G4733 Obstructive sleep apnea (adult) (pediatric): Secondary | ICD-10-CM | POA: Diagnosis not present

## 2016-09-24 DIAGNOSIS — J4520 Mild intermittent asthma, uncomplicated: Secondary | ICD-10-CM | POA: Diagnosis not present

## 2016-09-24 DIAGNOSIS — J310 Chronic rhinitis: Secondary | ICD-10-CM | POA: Diagnosis not present

## 2016-09-24 DIAGNOSIS — G4733 Obstructive sleep apnea (adult) (pediatric): Secondary | ICD-10-CM | POA: Diagnosis not present

## 2016-09-30 DIAGNOSIS — Z23 Encounter for immunization: Secondary | ICD-10-CM | POA: Diagnosis not present

## 2016-10-27 DIAGNOSIS — J209 Acute bronchitis, unspecified: Secondary | ICD-10-CM | POA: Diagnosis not present

## 2016-12-10 DIAGNOSIS — L820 Inflamed seborrheic keratosis: Secondary | ICD-10-CM | POA: Diagnosis not present

## 2016-12-10 DIAGNOSIS — C44629 Squamous cell carcinoma of skin of left upper limb, including shoulder: Secondary | ICD-10-CM | POA: Diagnosis not present

## 2016-12-10 DIAGNOSIS — L570 Actinic keratosis: Secondary | ICD-10-CM | POA: Diagnosis not present

## 2016-12-10 DIAGNOSIS — L821 Other seborrheic keratosis: Secondary | ICD-10-CM | POA: Diagnosis not present

## 2016-12-10 DIAGNOSIS — L578 Other skin changes due to chronic exposure to nonionizing radiation: Secondary | ICD-10-CM | POA: Diagnosis not present

## 2017-01-10 DIAGNOSIS — K7581 Nonalcoholic steatohepatitis (NASH): Secondary | ICD-10-CM | POA: Diagnosis not present

## 2017-01-14 DIAGNOSIS — C44629 Squamous cell carcinoma of skin of left upper limb, including shoulder: Secondary | ICD-10-CM | POA: Diagnosis not present

## 2017-01-14 DIAGNOSIS — L570 Actinic keratosis: Secondary | ICD-10-CM | POA: Diagnosis not present

## 2017-01-14 DIAGNOSIS — L578 Other skin changes due to chronic exposure to nonionizing radiation: Secondary | ICD-10-CM | POA: Diagnosis not present

## 2017-01-18 DIAGNOSIS — E119 Type 2 diabetes mellitus without complications: Secondary | ICD-10-CM | POA: Diagnosis not present

## 2017-01-18 DIAGNOSIS — J309 Allergic rhinitis, unspecified: Secondary | ICD-10-CM | POA: Diagnosis not present

## 2017-01-18 DIAGNOSIS — Z6837 Body mass index (BMI) 37.0-37.9, adult: Secondary | ICD-10-CM | POA: Diagnosis not present

## 2017-01-18 DIAGNOSIS — R1013 Epigastric pain: Secondary | ICD-10-CM | POA: Diagnosis not present

## 2017-01-18 DIAGNOSIS — Z9181 History of falling: Secondary | ICD-10-CM | POA: Diagnosis not present

## 2017-01-18 DIAGNOSIS — Z1389 Encounter for screening for other disorder: Secondary | ICD-10-CM | POA: Diagnosis not present

## 2017-01-18 DIAGNOSIS — E785 Hyperlipidemia, unspecified: Secondary | ICD-10-CM | POA: Diagnosis not present

## 2017-01-18 DIAGNOSIS — Z79899 Other long term (current) drug therapy: Secondary | ICD-10-CM | POA: Diagnosis not present

## 2017-01-18 DIAGNOSIS — K219 Gastro-esophageal reflux disease without esophagitis: Secondary | ICD-10-CM | POA: Diagnosis not present

## 2017-01-18 DIAGNOSIS — K7581 Nonalcoholic steatohepatitis (NASH): Secondary | ICD-10-CM | POA: Diagnosis not present

## 2017-01-18 DIAGNOSIS — E039 Hypothyroidism, unspecified: Secondary | ICD-10-CM | POA: Diagnosis not present

## 2017-01-18 DIAGNOSIS — I10 Essential (primary) hypertension: Secondary | ICD-10-CM | POA: Diagnosis not present

## 2017-01-19 DIAGNOSIS — G4733 Obstructive sleep apnea (adult) (pediatric): Secondary | ICD-10-CM | POA: Diagnosis not present

## 2017-01-23 HISTORY — PX: LEFT HEART CATH AND CORONARY ANGIOGRAPHY: CATH118249

## 2017-01-24 DIAGNOSIS — J4520 Mild intermittent asthma, uncomplicated: Secondary | ICD-10-CM | POA: Diagnosis not present

## 2017-01-24 DIAGNOSIS — J310 Chronic rhinitis: Secondary | ICD-10-CM | POA: Diagnosis not present

## 2017-01-24 DIAGNOSIS — G4733 Obstructive sleep apnea (adult) (pediatric): Secondary | ICD-10-CM | POA: Diagnosis not present

## 2017-01-26 DIAGNOSIS — M24011 Loose body in right shoulder: Secondary | ICD-10-CM | POA: Diagnosis not present

## 2017-01-26 DIAGNOSIS — E088 Diabetes mellitus due to underlying condition with unspecified complications: Secondary | ICD-10-CM | POA: Diagnosis not present

## 2017-01-26 DIAGNOSIS — Z09 Encounter for follow-up examination after completed treatment for conditions other than malignant neoplasm: Secondary | ICD-10-CM

## 2017-01-26 DIAGNOSIS — R6884 Jaw pain: Secondary | ICD-10-CM | POA: Diagnosis not present

## 2017-01-26 DIAGNOSIS — E785 Hyperlipidemia, unspecified: Secondary | ICD-10-CM | POA: Diagnosis not present

## 2017-01-26 DIAGNOSIS — I209 Angina pectoris, unspecified: Secondary | ICD-10-CM | POA: Diagnosis not present

## 2017-01-26 DIAGNOSIS — I10 Essential (primary) hypertension: Secondary | ICD-10-CM | POA: Diagnosis not present

## 2017-01-26 DIAGNOSIS — R079 Chest pain, unspecified: Secondary | ICD-10-CM | POA: Diagnosis not present

## 2017-01-26 HISTORY — DX: Encounter for follow-up examination after completed treatment for conditions other than malignant neoplasm: Z09

## 2017-01-28 DIAGNOSIS — R0789 Other chest pain: Secondary | ICD-10-CM | POA: Diagnosis not present

## 2017-01-28 DIAGNOSIS — I209 Angina pectoris, unspecified: Secondary | ICD-10-CM | POA: Diagnosis not present

## 2017-01-28 DIAGNOSIS — E669 Obesity, unspecified: Secondary | ICD-10-CM | POA: Diagnosis not present

## 2017-01-28 DIAGNOSIS — Z87891 Personal history of nicotine dependence: Secondary | ICD-10-CM | POA: Diagnosis not present

## 2017-01-28 DIAGNOSIS — E785 Hyperlipidemia, unspecified: Secondary | ICD-10-CM | POA: Diagnosis not present

## 2017-01-28 DIAGNOSIS — E119 Type 2 diabetes mellitus without complications: Secondary | ICD-10-CM | POA: Diagnosis not present

## 2017-01-28 DIAGNOSIS — I340 Nonrheumatic mitral (valve) insufficiency: Secondary | ICD-10-CM | POA: Diagnosis not present

## 2017-01-28 DIAGNOSIS — I10 Essential (primary) hypertension: Secondary | ICD-10-CM | POA: Diagnosis not present

## 2017-01-28 DIAGNOSIS — I519 Heart disease, unspecified: Secondary | ICD-10-CM | POA: Diagnosis not present

## 2017-01-28 DIAGNOSIS — Z882 Allergy status to sulfonamides status: Secondary | ICD-10-CM | POA: Diagnosis not present

## 2017-01-28 DIAGNOSIS — I25118 Atherosclerotic heart disease of native coronary artery with other forms of angina pectoris: Secondary | ICD-10-CM | POA: Diagnosis not present

## 2017-01-28 DIAGNOSIS — Z881 Allergy status to other antibiotic agents status: Secondary | ICD-10-CM | POA: Diagnosis not present

## 2017-01-28 DIAGNOSIS — Z79899 Other long term (current) drug therapy: Secondary | ICD-10-CM | POA: Diagnosis not present

## 2017-01-28 DIAGNOSIS — Z885 Allergy status to narcotic agent status: Secondary | ICD-10-CM | POA: Diagnosis not present

## 2017-01-28 DIAGNOSIS — I348 Other nonrheumatic mitral valve disorders: Secondary | ICD-10-CM | POA: Diagnosis not present

## 2017-02-08 DIAGNOSIS — R1013 Epigastric pain: Secondary | ICD-10-CM | POA: Diagnosis not present

## 2017-02-08 DIAGNOSIS — Z9049 Acquired absence of other specified parts of digestive tract: Secondary | ICD-10-CM | POA: Diagnosis not present

## 2017-02-08 DIAGNOSIS — N200 Calculus of kidney: Secondary | ICD-10-CM | POA: Diagnosis not present

## 2017-02-08 DIAGNOSIS — N281 Cyst of kidney, acquired: Secondary | ICD-10-CM | POA: Diagnosis not present

## 2017-02-09 DIAGNOSIS — K7581 Nonalcoholic steatohepatitis (NASH): Secondary | ICD-10-CM | POA: Diagnosis not present

## 2017-02-09 DIAGNOSIS — R1013 Epigastric pain: Secondary | ICD-10-CM | POA: Diagnosis not present

## 2017-02-09 DIAGNOSIS — K3189 Other diseases of stomach and duodenum: Secondary | ICD-10-CM | POA: Diagnosis not present

## 2017-02-09 DIAGNOSIS — K317 Polyp of stomach and duodenum: Secondary | ICD-10-CM | POA: Diagnosis not present

## 2017-02-09 DIAGNOSIS — K2970 Gastritis, unspecified, without bleeding: Secondary | ICD-10-CM | POA: Diagnosis not present

## 2017-02-16 DIAGNOSIS — Z6837 Body mass index (BMI) 37.0-37.9, adult: Secondary | ICD-10-CM | POA: Diagnosis not present

## 2017-02-16 DIAGNOSIS — Z79899 Other long term (current) drug therapy: Secondary | ICD-10-CM | POA: Diagnosis not present

## 2017-02-16 DIAGNOSIS — E119 Type 2 diabetes mellitus without complications: Secondary | ICD-10-CM | POA: Diagnosis not present

## 2017-02-16 DIAGNOSIS — J309 Allergic rhinitis, unspecified: Secondary | ICD-10-CM | POA: Diagnosis not present

## 2017-02-16 DIAGNOSIS — E785 Hyperlipidemia, unspecified: Secondary | ICD-10-CM | POA: Diagnosis not present

## 2017-02-16 DIAGNOSIS — E669 Obesity, unspecified: Secondary | ICD-10-CM | POA: Diagnosis not present

## 2017-02-16 DIAGNOSIS — I10 Essential (primary) hypertension: Secondary | ICD-10-CM | POA: Diagnosis not present

## 2017-02-16 DIAGNOSIS — K2970 Gastritis, unspecified, without bleeding: Secondary | ICD-10-CM | POA: Diagnosis not present

## 2017-02-16 DIAGNOSIS — I2510 Atherosclerotic heart disease of native coronary artery without angina pectoris: Secondary | ICD-10-CM | POA: Diagnosis not present

## 2017-02-16 DIAGNOSIS — E039 Hypothyroidism, unspecified: Secondary | ICD-10-CM | POA: Diagnosis not present

## 2017-03-31 DIAGNOSIS — R1013 Epigastric pain: Secondary | ICD-10-CM | POA: Diagnosis not present

## 2017-03-31 DIAGNOSIS — K219 Gastro-esophageal reflux disease without esophagitis: Secondary | ICD-10-CM | POA: Diagnosis not present

## 2017-04-06 DIAGNOSIS — J4521 Mild intermittent asthma with (acute) exacerbation: Secondary | ICD-10-CM | POA: Diagnosis not present

## 2017-04-14 DIAGNOSIS — E039 Hypothyroidism, unspecified: Secondary | ICD-10-CM | POA: Diagnosis not present

## 2017-04-14 DIAGNOSIS — E669 Obesity, unspecified: Secondary | ICD-10-CM | POA: Diagnosis not present

## 2017-04-14 DIAGNOSIS — E785 Hyperlipidemia, unspecified: Secondary | ICD-10-CM | POA: Diagnosis not present

## 2017-04-14 DIAGNOSIS — J449 Chronic obstructive pulmonary disease, unspecified: Secondary | ICD-10-CM | POA: Diagnosis not present

## 2017-04-14 DIAGNOSIS — I10 Essential (primary) hypertension: Secondary | ICD-10-CM | POA: Diagnosis not present

## 2017-04-14 DIAGNOSIS — E559 Vitamin D deficiency, unspecified: Secondary | ICD-10-CM | POA: Diagnosis not present

## 2017-04-14 DIAGNOSIS — E119 Type 2 diabetes mellitus without complications: Secondary | ICD-10-CM | POA: Diagnosis not present

## 2017-04-14 DIAGNOSIS — Z6836 Body mass index (BMI) 36.0-36.9, adult: Secondary | ICD-10-CM | POA: Diagnosis not present

## 2017-04-14 DIAGNOSIS — K2970 Gastritis, unspecified, without bleeding: Secondary | ICD-10-CM | POA: Diagnosis not present

## 2017-04-14 DIAGNOSIS — G4730 Sleep apnea, unspecified: Secondary | ICD-10-CM | POA: Diagnosis not present

## 2017-05-09 ENCOUNTER — Other Ambulatory Visit: Payer: Self-pay

## 2017-05-16 ENCOUNTER — Ambulatory Visit: Payer: Self-pay | Admitting: Cardiology

## 2017-05-17 ENCOUNTER — Encounter: Payer: Self-pay | Admitting: Cardiology

## 2017-05-17 ENCOUNTER — Ambulatory Visit (INDEPENDENT_AMBULATORY_CARE_PROVIDER_SITE_OTHER): Payer: Medicare Other | Admitting: Cardiology

## 2017-05-17 DIAGNOSIS — I2510 Atherosclerotic heart disease of native coronary artery without angina pectoris: Secondary | ICD-10-CM | POA: Diagnosis not present

## 2017-05-17 DIAGNOSIS — E782 Mixed hyperlipidemia: Secondary | ICD-10-CM | POA: Diagnosis not present

## 2017-05-17 DIAGNOSIS — I10 Essential (primary) hypertension: Secondary | ICD-10-CM | POA: Diagnosis not present

## 2017-05-17 HISTORY — DX: Mixed hyperlipidemia: E78.2

## 2017-05-17 HISTORY — DX: Essential (primary) hypertension: I10

## 2017-05-17 MED ORDER — ASPIRIN EC 81 MG PO TBEC: 81.0000 mg | DELAYED_RELEASE_TABLET | Freq: Every day | ORAL | 11 refills | Status: AC

## 2017-05-17 NOTE — Progress Notes (Signed)
Cardiology Office Note:    Date:  05/17/2017   ID:  [name], DOB 1936-10-06, MRN [id number]  PCP:  [name], MD  Cardiologist:  [name], MD   Referring MD: No ref. provider found    ASSESSMENT:    1. Coronary artery disease involving native coronary artery of native heart without angina pectoris Di 1. Moderate RCA and Cir lesions 2. Normal LV function, 2+ MR/ 2018  2. Essential hypertension   3. Mixed dyslipidemia      PLAN:    In order of problems listed above:  1. Secondary prevention stressed to the patient. Importance of compliance with diet and medications stressed and she vocalized understanding. Coronary angiography report was discussed with her at length importance of regular exercise stressed. Questions were answered to her satisfaction. 2. Her blood pressure is stable at this time 3. Is followed by her primary care physician 4. Echocardiogram mitral regurgitation and this will be monitored by [place] as an outpatient.Patient will be seen in follow-up appointment in 6 months or earlier if the patient has any concerns.    Medication Adjustments/Labs and Tests Ordered: Current medicines are reviewed at length with the patient today.  Concerns regarding medicines are outlined above.  No orders of the defined types were placed in this encounter.  Meds ordered this encounter  Medications  . aspirin EC 81 MG tablet    Sig: Take 1 tablet (81 mg total) by mouth daily.    Dispense:  30 tablet    Refill:  11     History of Present Illness:    Vanessa Johnson is a 81 y.o. female who is being seen today for the evaluation of Coronary artery disease at the request of primary care doctor. She is a pleasant 81 year old female she has been evaluated and taken care of by me for cardiovascular reasons at my previous practice. She is now here to get her care transferred and follow-up with me here. She denies any chest pain orthopnea or PND. She has started being  active and walking on a regular basis. She does not do this optimally. She underwent coronary angiography and was found to have nonobstructive disease and is following secondary prevention fairly meticulously. At the time of my evaluation she is alert awake oriented and in no distress.  Past Medical History:  Diagnosis Date  . Hypertension     History reviewed. No pertinent surgical history.  Current Medications: Current Meds  Medication Sig  . calcium carbonate (CALCIUM 600) 600 MG TABS tablet Take by mouth.  . DOCOSAHEXAENOIC ACID PO Take 1 g by mouth.  . hydrochlorothiazide (HYDRODIURIL) 25 MG tablet Take 25 mg by mouth daily.   . Levothyroxine Sodium 100 MCG CAPS Take 100 mcg by mouth daily.   Marland Kitchen losartan (COZAAR) 100 MG tablet Take 100 mg by mouth daily.   . meclizine (ANTIVERT) 25 MG tablet Take 25 mg by mouth daily.   . metFORMIN (GLUCOPHAGE) 500 MG tablet Take 500 mg by mouth 2 (two) times daily.   . metoprolol succinate (TOPROL-XL) 100 MG 24 hr tablet Take 100 mg by mouth daily.   . montelukast (SINGULAIR) 10 MG tablet Take 10 mg by mouth.  . pantoprazole (PROTONIX) 40 MG tablet Take 40 mg by mouth daily.  . ranitidine (ZANTAC) 300 MG tablet   . rosuvastatin (CRESTOR) 10 MG tablet Take 10 mg by mouth.  . vitamin E 400 UNIT capsule Take by mouth.     Allergies:  Cefuroxime axetil; Codeine; Iohexol; Sulfamethoxazole; Cephalosporins; Hydrocodone-chlorpheniramine; and Moxifloxacin   Social History   Social History  . Marital status: Widowed    Spouse name: N/A  . Number of children: N/A  . Years of education: N/A   Social History Main Topics  . Smoking status: Former Research scientist (life sciences)  . Smokeless tobacco: Never Used  . Alcohol use No  . Drug use: No  . Sexual activity: Not Asked   Other Topics Concern  . None   Social History Narrative  . None     Family History: The patient's family history is not on file.  ROS:   Please see the history of present illness.    All  other systems reviewed and are negative.  EKGs/Labs/Other Studies Reviewed:    The following studies were reviewed today: I reviewed records from previous office visits, coronary angiography and echocardiogram report at extensive length and discussed the findings with the patient.   Recent Labs: No results found for requested labs within last 8760 hours.  Recent Lipid Panel No results found for: CHOL, TRIG, HDL, CHOLHDL, VLDL, LDLCALC, LDLDIRECT  Physical Exam:    VS:  BP 120/72   Pulse 77   Ht 5\' 1"  (1.549 m)   Wt 193 lb 0.6 oz (87.6 kg)   SpO2 95%   BMI 36.47 kg/m     Wt Readings from Last 3 Encounters:  05/17/17 193 lb 0.6 oz (87.6 kg)     GEN: Patient is in no acute distress HEENT: Normal NECK: No JVD; No carotid bruits LYMPHATICS: No lymphadenopathy CARDIAC: S1 S2 regular, 2/6 systolic murmur at the apex. RESPIRATORY:  Clear to auscultation without rales, wheezing or rhonchi  ABDOMEN: Soft, non-tender, non-distended MUSCULOSKELETAL:  No edema; No deformity  SKIN: Warm and dry NEUROLOGIC:  Alert and oriented x 3 PSYCHIATRIC:  Normal affect    Signed, [name], MD  05/17/2017 11:57 AM    [place]

## 2017-05-17 NOTE — Patient Instructions (Signed)
Medication Instructions:  Your physician has recommended you make the following change in your medication:  START aspirin 81 mg EC daily   Labwork: None  Testing/Procedures: None  Follow-Up: Your physician wants you to follow-up in: 6 months. You will receive a reminder letter in the mail two months in advance. If you don't receive a letter, please call our office to schedule the follow-up appointment.   Any Other Special Instructions Will Be Listed Below (If Applicable).     If you need a refill on your cardiac medications before your next appointment, please call your pharmacy.

## 2017-07-01 DIAGNOSIS — R5383 Other fatigue: Secondary | ICD-10-CM | POA: Diagnosis not present

## 2017-07-01 DIAGNOSIS — J310 Chronic rhinitis: Secondary | ICD-10-CM | POA: Diagnosis not present

## 2017-07-01 DIAGNOSIS — J4520 Mild intermittent asthma, uncomplicated: Secondary | ICD-10-CM | POA: Diagnosis not present

## 2017-07-01 DIAGNOSIS — G4733 Obstructive sleep apnea (adult) (pediatric): Secondary | ICD-10-CM | POA: Diagnosis not present

## 2017-07-04 DIAGNOSIS — G4733 Obstructive sleep apnea (adult) (pediatric): Secondary | ICD-10-CM | POA: Diagnosis not present

## 2017-07-05 DIAGNOSIS — M1712 Unilateral primary osteoarthritis, left knee: Secondary | ICD-10-CM | POA: Diagnosis not present

## 2017-07-05 DIAGNOSIS — S8992XA Unspecified injury of left lower leg, initial encounter: Secondary | ICD-10-CM | POA: Diagnosis not present

## 2017-07-05 DIAGNOSIS — S8002XA Contusion of left knee, initial encounter: Secondary | ICD-10-CM | POA: Diagnosis not present

## 2017-07-05 DIAGNOSIS — S73102A Unspecified sprain of left hip, initial encounter: Secondary | ICD-10-CM | POA: Diagnosis not present

## 2017-07-05 DIAGNOSIS — M25562 Pain in left knee: Secondary | ICD-10-CM | POA: Diagnosis not present

## 2017-07-05 DIAGNOSIS — M25552 Pain in left hip: Secondary | ICD-10-CM | POA: Diagnosis not present

## 2017-07-05 DIAGNOSIS — S79912A Unspecified injury of left hip, initial encounter: Secondary | ICD-10-CM | POA: Diagnosis not present

## 2017-07-05 DIAGNOSIS — S8001XA Contusion of right knee, initial encounter: Secondary | ICD-10-CM | POA: Diagnosis not present

## 2017-07-06 DIAGNOSIS — B379 Candidiasis, unspecified: Secondary | ICD-10-CM | POA: Diagnosis not present

## 2017-07-28 DIAGNOSIS — L578 Other skin changes due to chronic exposure to nonionizing radiation: Secondary | ICD-10-CM | POA: Diagnosis not present

## 2017-07-28 DIAGNOSIS — L570 Actinic keratosis: Secondary | ICD-10-CM | POA: Diagnosis not present

## 2017-07-28 DIAGNOSIS — L821 Other seborrheic keratosis: Secondary | ICD-10-CM | POA: Diagnosis not present

## 2017-07-28 DIAGNOSIS — C44629 Squamous cell carcinoma of skin of left upper limb, including shoulder: Secondary | ICD-10-CM | POA: Diagnosis not present

## 2017-08-05 DIAGNOSIS — J189 Pneumonia, unspecified organism: Secondary | ICD-10-CM | POA: Diagnosis not present

## 2017-08-05 DIAGNOSIS — R05 Cough: Secondary | ICD-10-CM | POA: Diagnosis not present

## 2017-08-15 DIAGNOSIS — G4733 Obstructive sleep apnea (adult) (pediatric): Secondary | ICD-10-CM | POA: Diagnosis not present

## 2017-08-23 DIAGNOSIS — J310 Chronic rhinitis: Secondary | ICD-10-CM | POA: Diagnosis not present

## 2017-08-23 DIAGNOSIS — G4733 Obstructive sleep apnea (adult) (pediatric): Secondary | ICD-10-CM | POA: Diagnosis not present

## 2017-08-23 DIAGNOSIS — J4520 Mild intermittent asthma, uncomplicated: Secondary | ICD-10-CM | POA: Diagnosis not present

## 2017-09-02 DIAGNOSIS — E785 Hyperlipidemia, unspecified: Secondary | ICD-10-CM | POA: Diagnosis not present

## 2017-09-02 DIAGNOSIS — Z1331 Encounter for screening for depression: Secondary | ICD-10-CM | POA: Diagnosis not present

## 2017-09-02 DIAGNOSIS — Z9181 History of falling: Secondary | ICD-10-CM | POA: Diagnosis not present

## 2017-09-02 DIAGNOSIS — Z6836 Body mass index (BMI) 36.0-36.9, adult: Secondary | ICD-10-CM | POA: Diagnosis not present

## 2017-09-02 DIAGNOSIS — Z0000 Encounter for general adult medical examination without abnormal findings: Secondary | ICD-10-CM | POA: Diagnosis not present

## 2017-09-02 DIAGNOSIS — Z1231 Encounter for screening mammogram for malignant neoplasm of breast: Secondary | ICD-10-CM | POA: Diagnosis not present

## 2017-09-02 DIAGNOSIS — J449 Chronic obstructive pulmonary disease, unspecified: Secondary | ICD-10-CM | POA: Diagnosis not present

## 2017-09-16 DIAGNOSIS — J069 Acute upper respiratory infection, unspecified: Secondary | ICD-10-CM | POA: Diagnosis not present

## 2017-09-16 DIAGNOSIS — R0609 Other forms of dyspnea: Secondary | ICD-10-CM | POA: Diagnosis not present

## 2017-09-22 DIAGNOSIS — E669 Obesity, unspecified: Secondary | ICD-10-CM | POA: Diagnosis not present

## 2017-09-22 DIAGNOSIS — Z6835 Body mass index (BMI) 35.0-35.9, adult: Secondary | ICD-10-CM | POA: Diagnosis not present

## 2017-09-22 DIAGNOSIS — J209 Acute bronchitis, unspecified: Secondary | ICD-10-CM | POA: Diagnosis not present

## 2017-09-22 DIAGNOSIS — J019 Acute sinusitis, unspecified: Secondary | ICD-10-CM | POA: Diagnosis not present

## 2017-09-22 DIAGNOSIS — I10 Essential (primary) hypertension: Secondary | ICD-10-CM | POA: Diagnosis not present

## 2017-09-23 DIAGNOSIS — J209 Acute bronchitis, unspecified: Secondary | ICD-10-CM | POA: Diagnosis not present

## 2017-09-30 DIAGNOSIS — Z1231 Encounter for screening mammogram for malignant neoplasm of breast: Secondary | ICD-10-CM | POA: Diagnosis not present

## 2017-10-11 DIAGNOSIS — E039 Hypothyroidism, unspecified: Secondary | ICD-10-CM | POA: Diagnosis not present

## 2017-10-11 DIAGNOSIS — J309 Allergic rhinitis, unspecified: Secondary | ICD-10-CM | POA: Diagnosis not present

## 2017-10-11 DIAGNOSIS — Z79899 Other long term (current) drug therapy: Secondary | ICD-10-CM | POA: Diagnosis not present

## 2017-10-11 DIAGNOSIS — J45998 Other asthma: Secondary | ICD-10-CM | POA: Diagnosis not present

## 2017-10-11 DIAGNOSIS — G4730 Sleep apnea, unspecified: Secondary | ICD-10-CM | POA: Diagnosis not present

## 2017-10-11 DIAGNOSIS — Z6835 Body mass index (BMI) 35.0-35.9, adult: Secondary | ICD-10-CM | POA: Diagnosis not present

## 2017-10-11 DIAGNOSIS — E119 Type 2 diabetes mellitus without complications: Secondary | ICD-10-CM | POA: Diagnosis not present

## 2017-10-11 DIAGNOSIS — E559 Vitamin D deficiency, unspecified: Secondary | ICD-10-CM | POA: Diagnosis not present

## 2017-10-11 DIAGNOSIS — E669 Obesity, unspecified: Secondary | ICD-10-CM | POA: Diagnosis not present

## 2017-10-11 DIAGNOSIS — Z23 Encounter for immunization: Secondary | ICD-10-CM | POA: Diagnosis not present

## 2017-10-11 DIAGNOSIS — E785 Hyperlipidemia, unspecified: Secondary | ICD-10-CM | POA: Diagnosis not present

## 2017-10-11 DIAGNOSIS — I10 Essential (primary) hypertension: Secondary | ICD-10-CM | POA: Diagnosis not present

## 2017-10-19 DIAGNOSIS — M25532 Pain in left wrist: Secondary | ICD-10-CM | POA: Diagnosis not present

## 2017-10-19 DIAGNOSIS — W01198A Fall on same level from slipping, tripping and stumbling with subsequent striking against other object, initial encounter: Secondary | ICD-10-CM | POA: Diagnosis not present

## 2017-10-19 DIAGNOSIS — S298XXA Other specified injuries of thorax, initial encounter: Secondary | ICD-10-CM | POA: Diagnosis not present

## 2017-10-26 DIAGNOSIS — S63602A Unspecified sprain of left thumb, initial encounter: Secondary | ICD-10-CM | POA: Diagnosis not present

## 2017-10-26 DIAGNOSIS — M150 Primary generalized (osteo)arthritis: Secondary | ICD-10-CM | POA: Diagnosis not present

## 2017-10-26 DIAGNOSIS — S63502A Unspecified sprain of left wrist, initial encounter: Secondary | ICD-10-CM | POA: Diagnosis not present

## 2017-10-28 DIAGNOSIS — J4520 Mild intermittent asthma, uncomplicated: Secondary | ICD-10-CM | POA: Diagnosis not present

## 2017-10-28 DIAGNOSIS — J310 Chronic rhinitis: Secondary | ICD-10-CM | POA: Diagnosis not present

## 2017-10-28 DIAGNOSIS — G4733 Obstructive sleep apnea (adult) (pediatric): Secondary | ICD-10-CM | POA: Diagnosis not present

## 2017-11-04 DIAGNOSIS — H35373 Puckering of macula, bilateral: Secondary | ICD-10-CM | POA: Diagnosis not present

## 2017-11-04 DIAGNOSIS — H35341 Macular cyst, hole, or pseudohole, right eye: Secondary | ICD-10-CM | POA: Diagnosis not present

## 2017-11-29 DIAGNOSIS — Z961 Presence of intraocular lens: Secondary | ICD-10-CM | POA: Diagnosis not present

## 2017-11-29 DIAGNOSIS — H35341 Macular cyst, hole, or pseudohole, right eye: Secondary | ICD-10-CM | POA: Diagnosis not present

## 2017-11-29 DIAGNOSIS — H35373 Puckering of macula, bilateral: Secondary | ICD-10-CM | POA: Diagnosis not present

## 2017-11-29 DIAGNOSIS — E119 Type 2 diabetes mellitus without complications: Secondary | ICD-10-CM | POA: Diagnosis not present

## 2017-12-21 ENCOUNTER — Encounter: Payer: Self-pay | Admitting: Cardiology

## 2017-12-21 ENCOUNTER — Ambulatory Visit (INDEPENDENT_AMBULATORY_CARE_PROVIDER_SITE_OTHER): Payer: Medicare Other | Admitting: Cardiology

## 2017-12-21 VITALS — BP 124/80 | HR 88 | Ht 61.0 in | Wt 195.0 lb

## 2017-12-21 DIAGNOSIS — E782 Mixed hyperlipidemia: Secondary | ICD-10-CM | POA: Diagnosis not present

## 2017-12-21 DIAGNOSIS — G4733 Obstructive sleep apnea (adult) (pediatric): Secondary | ICD-10-CM | POA: Diagnosis not present

## 2017-12-21 DIAGNOSIS — I2510 Atherosclerotic heart disease of native coronary artery without angina pectoris: Secondary | ICD-10-CM | POA: Diagnosis not present

## 2017-12-21 DIAGNOSIS — E119 Type 2 diabetes mellitus without complications: Secondary | ICD-10-CM | POA: Diagnosis not present

## 2017-12-21 DIAGNOSIS — I10 Essential (primary) hypertension: Secondary | ICD-10-CM

## 2017-12-21 NOTE — Progress Notes (Signed)
Cardiology Office Note:    Date:  12/21/2017   ID:  Vanessa Johnson, DOB 10-May-1936, MRN [id number]  PCP:  [name], MD  Cardiologist:  [name], MD   Referring MD: [name], MD    ASSESSMENT:    1. Coronary artery disease involving native coronary artery of native heart without angina pectoris   2. Essential hypertension   3. Obstructive sleep apnea of adult   4. Diabetes mellitus without complication ([place])   5. Mixed dyslipidemia    PLAN:    In order of problems listed above:  1. Secondary prevention stressed with the patient.  Importance of compliance with diet and medications stressed and she vocalized understanding.  Diet was discussed for dyslipidemia diabetes mellitus and obesity and the risks of obesity were explained.  She plans to do better. 2. I had an extensive discussion with her primary care physician about her lipid and liver issue.  She has had elevated LFTs in the past for this reason she has not taken statins for a short period of time.  She will go tomorrow morning to her primary care doctor's office in [place] as she lives there and obtain a Chem-7 and liver lipid check.  Once I get those reports I will reviewed to consider low-dose statin therapy.  Her fatty liver has been attributed by gastroenterologist to her diabetes and obesity condition.  Her other evaluation has been unremarkable.  If her LFTs are fine we will initiate her on a low-dose statin and follow-up with her.  If there are any issues or concerns we will consult with the gastroenterologist about the same. 3. Patient will be seen in follow-up appointment in 6 months or earlier if the patient has any concerns    Medication Adjustments/Labs and Tests Ordered: Current medicines are reviewed at length with the patient today.  Concerns regarding medicines are outlined above.  No orders of the defined types were placed in this encounter.  No orders of the defined types were placed in this  encounter.    Chief Complaint  Patient presents with  . Follow-up  . Coronary Artery Disease     History of Present Illness:    Vanessa Johnson is a 82 y.o. female.  Patient has known coronary artery disease.  She denies any problems at this time and takes care of activities of daily living.  No chest pain orthopnea or PND.  She has essential hypertension, diabetes mellitus and dyslipidemia.  That her statins have been on hold because of elevated liver tests.  She was concerned about elevated liver tests and decided not to take them for some time.  She is keen on that evaluation again at this time.  She is morbidly obese and leads a sedentary lifestyle.  At the time of my evaluation, the patient is alert awake oriented and in no distress.  Past Medical History:  Diagnosis Date  . Hypertension     History reviewed. No pertinent surgical history.  Current Medications: Current Meds  Medication Sig  . albuterol (PROVENTIL) (2.5 MG/3ML) 0.083% nebulizer solution   . aspirin EC 81 MG tablet Take 1 tablet (81 mg total) by mouth daily.  . calcium carbonate (CALCIUM 600) 600 MG TABS tablet Take by mouth.  . cyclobenzaprine (FLEXERIL) 10 MG tablet Take by mouth.  . DOCOSAHEXAENOIC ACID PO Take 1 g by mouth.  . esomeprazole (NEXIUM) 40 MG capsule Take by mouth.  . FIBER SELECT [place] by mouth.  . fluconazole (  DIFLUCAN) 150 MG tablet TK 1 T PO WEEKLY  . Ginkgo Biloba Extract (GINKGO BILOBA MEMORY ENHANCER) 60 MG CAPS Take by mouth.  . hydrochlorothiazide (HYDRODIURIL) 25 MG tablet Take 25 mg by mouth daily.   Marland Kitchen levalbuterol (XOPENEX) 0.31 MG/3ML nebulizer solution Take 1 ampule by nebulization every 4 (four) hours as needed for Wheezing.  Marland Kitchen levothyroxine (SYNTHROID, LEVOTHROID) 100 MCG tablet   . Levothyroxine Sodium 100 MCG CAPS Take 100 mcg by mouth daily.   Marland Kitchen losartan (COZAAR) 100 MG tablet Take 100 mg by mouth daily.   . meclizine (ANTIVERT) 25 MG tablet Take 25 mg by mouth  daily.   . metFORMIN (GLUCOPHAGE) 500 MG tablet Take 500 mg by mouth 2 (two) times daily.   . metoprolol succinate (TOPROL-XL) 100 MG 24 hr tablet Take 100 mg by mouth daily.   . montelukast (SINGULAIR) 10 MG tablet Take 10 mg by mouth.  . nitroGLYCERIN (NITROSTAT) 0.4 MG SL tablet Place 0.4 mg under the tongue.  . nystatin-triamcinolone (MYCOLOG II) cream Apply topically.  Marland Kitchen omeprazole (PRILOSEC) 40 MG capsule Take 40 mg by mouth.  . pantoprazole (PROTONIX) 40 MG tablet Take 40 mg by mouth daily.  . polyethylene glycol (MIRALAX / GLYCOLAX) packet Take 17 g by mouth.  . ranitidine (ZANTAC) 300 MG tablet   . rosuvastatin (CRESTOR) 10 MG tablet Take 10 mg by mouth.  . terbinafine (LAMISIL) 250 MG tablet Take by mouth.  . Vitamin D, Ergocalciferol, (DRISDOL) 50000 units CAPS capsule Take by mouth.  . vitamin E 400 UNIT capsule Take by mouth.     Allergies:   Cefuroxime axetil; Codeine; Iohexol; Sulfamethoxazole; Cephalosporins; Hydrocodone-chlorpheniramine; and Moxifloxacin   Social History   Socioeconomic History  . Marital status: Widowed    Spouse name: None  . Number of children: None  . Years of education: None  . Highest education level: None  Social Needs  . Financial resource strain: None  . Food insecurity - worry: None  . Food insecurity - inability: None  . Transportation needs - medical: None  . Transportation needs - non-medical: None  Occupational History  . None  Tobacco Use  . Smoking status: Former Research scientist (life sciences)  . Smokeless tobacco: Never Used  Substance and Sexual Activity  . Alcohol use: No  . Drug use: No  . Sexual activity: None  Other Topics Concern  . None  Social History Narrative  . None     Family History: The patient's family history is not on file.  ROS:   Please see the history of present illness.    All other systems reviewed and are negative.  EKGs/Labs/Other Studies Reviewed:    The following studies were reviewed today: I discussed my  findings with the patient at extensive length.  EKG reveals sinus rhythm and nonspecific ST-T changes.   Recent Labs: No results found for requested labs within last 8760 hours.  Recent Lipid Panel No results found for: CHOL, TRIG, HDL, CHOLHDL, VLDL, LDLCALC, LDLDIRECT  Physical Exam:    VS:  BP 124/80 (BP Location: Left Arm, Patient Position: Sitting, Cuff Size: Normal)   Pulse 88   Ht 5\' 1"  (1.549 m)   Wt 195 lb (88.5 kg)   SpO2 99%   BMI 36.84 kg/m     Wt Readings from Last 3 Encounters:  12/21/17 195 lb (88.5 kg)  05/17/17 193 lb 0.6 oz (87.6 kg)     GEN: Patient is in no acute distress HEENT: Normal NECK: No JVD; No  carotid bruits LYMPHATICS: No lymphadenopathy CARDIAC: Hear sounds regular, 2/6 systolic murmur at the apex. RESPIRATORY:  Clear to auscultation without rales, wheezing or rhonchi  ABDOMEN: Soft, non-tender, non-distended MUSCULOSKELETAL:  No edema; No deformity  SKIN: Warm and dry NEUROLOGIC:  Alert and oriented x 3 PSYCHIATRIC:  Normal affect   Signed, [name], MD  12/21/2017 10:58 AM    [place]

## 2017-12-21 NOTE — Patient Instructions (Signed)
Medication Instructions:  °Your physician recommends that you continue on your current medications as directed. Please refer to the Current Medication list given to you today. ° ° °Labwork: °none ° °Testing/Procedures: °none ° °Follow-Up: °6 months ° °Any Other Special Instructions Will Be Listed Below (If Applicable). ° °If you need a refill on your cardiac medications before your next appointment, please call your pharmacy. ° °

## 2017-12-22 DIAGNOSIS — Z79899 Other long term (current) drug therapy: Secondary | ICD-10-CM | POA: Diagnosis not present

## 2017-12-22 DIAGNOSIS — E785 Hyperlipidemia, unspecified: Secondary | ICD-10-CM | POA: Diagnosis not present

## 2018-01-02 ENCOUNTER — Other Ambulatory Visit: Payer: Self-pay

## 2018-01-02 ENCOUNTER — Telehealth: Payer: Self-pay

## 2018-01-02 DIAGNOSIS — E782 Mixed hyperlipidemia: Secondary | ICD-10-CM

## 2018-01-02 DIAGNOSIS — I2510 Atherosclerotic heart disease of native coronary artery without angina pectoris: Secondary | ICD-10-CM

## 2018-01-02 MED ORDER — ROSUVASTATIN CALCIUM 10 MG PO TABS: 5.0000 mg | ORAL_TABLET | Freq: Every day | ORAL | 2 refills | Status: DC

## 2018-01-02 NOTE — Telephone Encounter (Signed)
Left voicemail for the patient to call the office regarding her labs sent from PCP.

## 2018-01-02 NOTE — Telephone Encounter (Signed)
Informed pt per labs from PCP to take Crestor 5 mg  q daily or every other day. Pt was agreeable to liver and  lipids check in 6 weeks.

## 2018-01-10 DIAGNOSIS — K7581 Nonalcoholic steatohepatitis (NASH): Secondary | ICD-10-CM | POA: Diagnosis not present

## 2018-01-10 DIAGNOSIS — N281 Cyst of kidney, acquired: Secondary | ICD-10-CM | POA: Diagnosis not present

## 2018-01-10 DIAGNOSIS — N200 Calculus of kidney: Secondary | ICD-10-CM | POA: Diagnosis not present

## 2018-01-21 DIAGNOSIS — L299 Pruritus, unspecified: Secondary | ICD-10-CM | POA: Diagnosis not present

## 2018-01-21 DIAGNOSIS — R21 Rash and other nonspecific skin eruption: Secondary | ICD-10-CM | POA: Diagnosis not present

## 2018-01-26 DIAGNOSIS — I10 Essential (primary) hypertension: Secondary | ICD-10-CM | POA: Diagnosis not present

## 2018-01-26 DIAGNOSIS — S60211A Contusion of right wrist, initial encounter: Secondary | ICD-10-CM | POA: Diagnosis not present

## 2018-01-26 DIAGNOSIS — S6391XA Sprain of unspecified part of right wrist and hand, initial encounter: Secondary | ICD-10-CM | POA: Diagnosis not present

## 2018-01-31 DIAGNOSIS — L719 Rosacea, unspecified: Secondary | ICD-10-CM | POA: Diagnosis not present

## 2018-01-31 DIAGNOSIS — L219 Seborrheic dermatitis, unspecified: Secondary | ICD-10-CM | POA: Diagnosis not present

## 2018-01-31 DIAGNOSIS — L570 Actinic keratosis: Secondary | ICD-10-CM | POA: Diagnosis not present

## 2018-02-01 DIAGNOSIS — G4733 Obstructive sleep apnea (adult) (pediatric): Secondary | ICD-10-CM | POA: Diagnosis not present

## 2018-02-01 DIAGNOSIS — J4530 Mild persistent asthma, uncomplicated: Secondary | ICD-10-CM | POA: Diagnosis not present

## 2018-02-01 DIAGNOSIS — J310 Chronic rhinitis: Secondary | ICD-10-CM | POA: Diagnosis not present

## 2018-02-09 DIAGNOSIS — E119 Type 2 diabetes mellitus without complications: Secondary | ICD-10-CM | POA: Diagnosis not present

## 2018-02-09 DIAGNOSIS — E039 Hypothyroidism, unspecified: Secondary | ICD-10-CM | POA: Diagnosis not present

## 2018-02-09 DIAGNOSIS — I10 Essential (primary) hypertension: Secondary | ICD-10-CM | POA: Diagnosis not present

## 2018-02-09 DIAGNOSIS — E559 Vitamin D deficiency, unspecified: Secondary | ICD-10-CM | POA: Diagnosis not present

## 2018-02-09 DIAGNOSIS — J309 Allergic rhinitis, unspecified: Secondary | ICD-10-CM | POA: Diagnosis not present

## 2018-02-09 DIAGNOSIS — Z6835 Body mass index (BMI) 35.0-35.9, adult: Secondary | ICD-10-CM | POA: Diagnosis not present

## 2018-02-09 DIAGNOSIS — E785 Hyperlipidemia, unspecified: Secondary | ICD-10-CM | POA: Diagnosis not present

## 2018-02-09 DIAGNOSIS — Z79899 Other long term (current) drug therapy: Secondary | ICD-10-CM | POA: Diagnosis not present

## 2018-02-09 DIAGNOSIS — J45909 Unspecified asthma, uncomplicated: Secondary | ICD-10-CM | POA: Diagnosis not present

## 2018-02-09 DIAGNOSIS — K7581 Nonalcoholic steatohepatitis (NASH): Secondary | ICD-10-CM | POA: Diagnosis not present

## 2018-02-09 DIAGNOSIS — G4730 Sleep apnea, unspecified: Secondary | ICD-10-CM | POA: Diagnosis not present

## 2018-02-09 DIAGNOSIS — E669 Obesity, unspecified: Secondary | ICD-10-CM | POA: Diagnosis not present

## 2018-03-21 DIAGNOSIS — K7581 Nonalcoholic steatohepatitis (NASH): Secondary | ICD-10-CM | POA: Diagnosis not present

## 2018-03-21 DIAGNOSIS — K219 Gastro-esophageal reflux disease without esophagitis: Secondary | ICD-10-CM | POA: Diagnosis not present

## 2018-04-04 DIAGNOSIS — Z961 Presence of intraocular lens: Secondary | ICD-10-CM | POA: Diagnosis not present

## 2018-04-04 DIAGNOSIS — H35371 Puckering of macula, right eye: Secondary | ICD-10-CM | POA: Diagnosis not present

## 2018-04-04 DIAGNOSIS — E119 Type 2 diabetes mellitus without complications: Secondary | ICD-10-CM | POA: Diagnosis not present

## 2018-04-04 DIAGNOSIS — H35373 Puckering of macula, bilateral: Secondary | ICD-10-CM | POA: Diagnosis not present

## 2018-04-04 DIAGNOSIS — H35341 Macular cyst, hole, or pseudohole, right eye: Secondary | ICD-10-CM | POA: Diagnosis not present

## 2018-05-11 DIAGNOSIS — E119 Type 2 diabetes mellitus without complications: Secondary | ICD-10-CM | POA: Diagnosis not present

## 2018-05-11 DIAGNOSIS — E039 Hypothyroidism, unspecified: Secondary | ICD-10-CM | POA: Diagnosis not present

## 2018-05-11 DIAGNOSIS — Z1339 Encounter for screening examination for other mental health and behavioral disorders: Secondary | ICD-10-CM | POA: Diagnosis not present

## 2018-05-11 DIAGNOSIS — I10 Essential (primary) hypertension: Secondary | ICD-10-CM | POA: Diagnosis not present

## 2018-05-11 DIAGNOSIS — Z6834 Body mass index (BMI) 34.0-34.9, adult: Secondary | ICD-10-CM | POA: Diagnosis not present

## 2018-05-11 DIAGNOSIS — E669 Obesity, unspecified: Secondary | ICD-10-CM | POA: Diagnosis not present

## 2018-06-22 DIAGNOSIS — M7072 Other bursitis of hip, left hip: Secondary | ICD-10-CM | POA: Diagnosis not present

## 2018-06-22 DIAGNOSIS — M545 Low back pain: Secondary | ICD-10-CM | POA: Diagnosis not present

## 2018-06-22 DIAGNOSIS — E669 Obesity, unspecified: Secondary | ICD-10-CM | POA: Diagnosis not present

## 2018-06-22 DIAGNOSIS — G4733 Obstructive sleep apnea (adult) (pediatric): Secondary | ICD-10-CM | POA: Diagnosis not present

## 2018-06-22 DIAGNOSIS — Z6834 Body mass index (BMI) 34.0-34.9, adult: Secondary | ICD-10-CM | POA: Diagnosis not present

## 2018-06-23 DIAGNOSIS — J4520 Mild intermittent asthma, uncomplicated: Secondary | ICD-10-CM | POA: Diagnosis not present

## 2018-06-23 DIAGNOSIS — G4733 Obstructive sleep apnea (adult) (pediatric): Secondary | ICD-10-CM | POA: Diagnosis not present

## 2018-06-23 DIAGNOSIS — J310 Chronic rhinitis: Secondary | ICD-10-CM | POA: Diagnosis not present

## 2018-06-23 DIAGNOSIS — R5383 Other fatigue: Secondary | ICD-10-CM | POA: Diagnosis not present

## 2018-07-03 ENCOUNTER — Encounter: Payer: Self-pay | Admitting: Cardiology

## 2018-07-14 ENCOUNTER — Encounter: Payer: Self-pay | Admitting: Cardiology

## 2018-07-14 ENCOUNTER — Ambulatory Visit (INDEPENDENT_AMBULATORY_CARE_PROVIDER_SITE_OTHER): Payer: Medicare Other | Admitting: Cardiology

## 2018-07-14 VITALS — BP 120/80 | HR 76 | Ht 61.0 in | Wt 183.0 lb

## 2018-07-14 DIAGNOSIS — E782 Mixed hyperlipidemia: Secondary | ICD-10-CM

## 2018-07-14 DIAGNOSIS — G4733 Obstructive sleep apnea (adult) (pediatric): Secondary | ICD-10-CM | POA: Diagnosis not present

## 2018-07-14 DIAGNOSIS — I10 Essential (primary) hypertension: Secondary | ICD-10-CM

## 2018-07-14 DIAGNOSIS — E119 Type 2 diabetes mellitus without complications: Secondary | ICD-10-CM | POA: Diagnosis not present

## 2018-07-14 DIAGNOSIS — I2510 Atherosclerotic heart disease of native coronary artery without angina pectoris: Secondary | ICD-10-CM | POA: Diagnosis not present

## 2018-07-14 MED ORDER — NITROGLYCERIN 0.4 MG SL SUBL: 0.4000 mg | SUBLINGUAL_TABLET | SUBLINGUAL | 11 refills | Status: DC | PRN

## 2018-07-14 MED ORDER — ROSUVASTATIN CALCIUM 10 MG PO TABS: 5.0000 mg | ORAL_TABLET | Freq: Every day | ORAL | 2 refills | Status: DC

## 2018-07-14 MED ORDER — LOSARTAN POTASSIUM 100 MG PO TABS: 100.0000 mg | ORAL_TABLET | Freq: Every day | ORAL | 2 refills | Status: DC

## 2018-07-14 MED ORDER — HYDROCHLOROTHIAZIDE 25 MG PO TABS: 25.0000 mg | ORAL_TABLET | Freq: Every day | ORAL | 5 refills | Status: DC | PRN

## 2018-07-14 MED ORDER — METOPROLOL SUCCINATE ER 100 MG PO TB24: 100.0000 mg | ORAL_TABLET | Freq: Every day | ORAL | 2 refills | Status: DC

## 2018-07-14 NOTE — Addendum Note (Signed)
Addended by: Mattie Marlin on: 07/14/2018 10:58 AM   Modules accepted: Orders

## 2018-07-14 NOTE — Progress Notes (Signed)
Cardiology Office Note:    Date:  07/14/2018   ID:  Vanessa Johnson, DOB 01-Apr-1936, MRN [id number]  PCP:  [name], MD  Cardiologist:  [name], MD   Referring MD: [name], MD    ASSESSMENT:    1. Coronary artery disease involving native coronary artery of native heart without angina pectoris   2. Essential hypertension   3. Mixed dyslipidemia   4. Diabetes mellitus without complication ([place])   5. Obstructive sleep apnea of adult    PLAN:    In order of problems listed above:  1. Secondary prevention stressed with the patient.  Importance of compliance with diet and medication stressed and she vocalized understanding.  Her blood pressure is stable.  Diet was discussed for dyslipidemia and diabetes mellitus. 2. Patient has had significant dyslipidemia in the past and was intolerant to statins.  Now she is taking 5 mg of rosuvastatin and doing well.  We will check her blood work today and if she needs to increase her dose. 3. Sublingual nitroglycerin prescription was sent, its protocol and 911 protocol explained and the patient vocalized understanding questions were answered to the patient's satisfaction 4. Diet was discussed for obesity and risks of obesity explained and she vocalized understanding. 5. Patient will be seen in follow-up appointment in 6 months or earlier if the patient has any concerns    Medication Adjustments/Labs and Tests Ordered: Current medicines are reviewed at length with the patient today.  Concerns regarding medicines are outlined above.  No orders of the defined types were placed in this encounter.  No orders of the defined types were placed in this encounter.    Chief Complaint  Patient presents with  . Follow-up     History of Present Illness:    Vanessa Johnson is a 82 y.o. female patient.  She has past medical history of coronary artery disease.  Her coronary angiography in 2018 revealed proximal circumflex 50% stenosis,  proximal and mid right coronary artery had 40 and 50% stenosis respectively.  And patient had 2+ mitral regurgitation.  Subsequently the patient has done fine.  She denies any chest pain orthopnea or PND.  She takes care of activities of daily living.  She is overweight and walks with a cane.  She has had no falls or any such issues.  At the time of my evaluation, the patient is alert awake oriented and in no distress.   Past Medical History:  Diagnosis Date  . Allergic rhinitis   . Back pain    left lower back  . Benign essential HTN   . BMI 34.0-34.9,adult   . Bronchial asthma   . CAD (coronary artery disease)    Heart [facility]2017 [facility]  . Diabetes mellitus ([place])   . Gastritis    Dr. Orlena Sheldon- EGD 2018  . GERD (gastroesophageal reflux disease)   . Hyperlipidemia   . Hypothyroid   . Ischiogluteal bursitis of left side   . Pre-ulcerative corn or callous   . Sleep apnea    On CPAP  . Vitamin D deficiency     Past Surgical History:  Procedure Laterality Date  . BREAST BIOPSY  1970, 1980, and 2000  . CATARACT EXTRACTION, BILATERAL  2016  . CHOLECYSTECTOMY  2017  . DILATION AND CURETTAGE OF UTERUS  1996  . [place]  . LEFT HEART CATH AND CORONARY ANGIOGRAPHY  01/2017   [facility]- No intervention  . TONSILLECTOMY  [place] Bilateral    left- 2005 and right- 2010    Current Medications: Current Meds  Medication Sig  . albuterol (PROVENTIL) (2.5 MG/3ML) 0.083% nebulizer solution   . aspirin EC 81 MG tablet Take 1 tablet (81 mg total) by mouth daily.  . calcium carbonate (CALCIUM 600) 600 MG TABS tablet Take by mouth.  . cyclobenzaprine (FLEXERIL) 10 MG tablet Take by mouth.  . DOCOSAHEXAENOIC ACID PO Take 1 g by mouth.  . esomeprazole (NEXIUM) 40 MG capsule Take by mouth.  . FIBER SELECT [place] by mouth.  . hydrochlorothiazide (HYDRODIURIL) 25 MG tablet Take 1 tablet by mouth daily  as needed. 1-2 tabs as needed daily for swelling  . levalbuterol (XOPENEX) 0.31 MG/3ML nebulizer solution Take 1 ampule by nebulization every 4 (four) hours as needed for Wheezing.  Marland Kitchen levothyroxine (SYNTHROID, LEVOTHROID) 100 MCG tablet   . losartan (COZAAR) 100 MG tablet Take 100 mg by mouth daily.   . meclizine (ANTIVERT) 25 MG tablet Take 25 mg by mouth every 8 (eight) hours as needed. Every 8-12 hours as needed  . metFORMIN (GLUCOPHAGE) 500 MG tablet Take 500 mg by mouth 2 (two) times daily.   . metoprolol succinate (TOPROL-XL) 100 MG 24 hr tablet Take 100 mg by mouth daily.   . montelukast (SINGULAIR) 10 MG tablet Take 10 mg by mouth.  . nystatin-triamcinolone (MYCOLOG II) cream Apply 1 application topically 3 (three) times daily.  . pantoprazole (PROTONIX) 40 MG tablet Take 40 mg by mouth daily.  . polyethylene glycol (MIRALAX / GLYCOLAX) packet Take 17 g by mouth.  . ranitidine (ZANTAC) 300 MG tablet Take 1 tablet by mouth 2 (two) times daily as needed.  . rosuvastatin (CRESTOR) 10 MG tablet Take 0.5 tablets (5 mg total) by mouth daily.  Marland Kitchen triamcinolone cream (KENALOG) 0.1 % Apply 1 application topically 3 (three) times daily as needed.  . Vitamin D, Ergocalciferol, (DRISDOL) 50000 units CAPS capsule Take by mouth.  . vitamin E 400 UNIT capsule Take by mouth.  . [DISCONTINUED] fluticasone (FLONASE) 50 MCG/ACT nasal spray Place 2 sprays into both nostrils daily as needed.     Allergies:   Tussionex pennkinetic er [hydrocod polst-cpm polst er]; Cefuroxime axetil; Codeine; Iohexol; Sulfamethoxazole; Cephalosporins; Hydrocodone-chlorpheniramine; and Moxifloxacin   Social History   Socioeconomic History  . Marital status: Widowed    Spouse name: Not on file  . Number of children: Not on file  . Years of education: Not on file  . Highest education level: Not on file  Occupational History  . Not on file  Social Needs  . Financial resource strain: Not on file  . Food insecurity:     Worry: Not on file    Inability: Not on file  . Transportation needs:    Medical: Not on file    Non-medical: Not on file  Tobacco Use  . Smoking status: Former Research scientist (life sciences)  . Smokeless tobacco: Never Used  Substance and Sexual Activity  . Alcohol use: No  . Drug use: No  . Sexual activity: Not on file  Lifestyle  . Physical activity:    Days per week: Not on file    Minutes per session: Not on file  . Stress: Not on file  Relationships  . Social connections:    Talks on phone: Not on file    Gets together: Not on file    Attends religious service: Not on file    Active member of  club or organization: Not on file    Attends meetings of clubs or organizations: Not on file    Relationship status: Not on file  Other Topics Concern  . Not on file  Social History Narrative  . Not on file     Family History: The patient's family history includes Asthma in her brother and sister; Breast cancer in her sister; COPD in her brother and sister; Diabetes in her brother, mother, and sister; Emphysema in her brother, father, and sister; Heart attack in her father and mother; Heart disease in her brother and mother; Hypertension in her father, mother, and sister; Lung cancer in her brother.  ROS:   Please see the history of present illness.    All other systems reviewed and are negative.  EKGs/Labs/Other Studies Reviewed:    The following studies were reviewed today: I discussed my findings with the patient at extensive length.   Recent Labs: No results found for requested labs within last 8760 hours.  Recent Lipid Panel No results found for: CHOL, TRIG, HDL, CHOLHDL, VLDL, LDLCALC, LDLDIRECT  Physical Exam:    VS:  BP 120/80 (BP Location: Right Arm, Patient Position: Sitting, Cuff Size: Normal)   Pulse 76   Ht 5\' 1"  (1.549 m)   Wt 183 lb (83 kg)   SpO2 98%   BMI 34.58 kg/m     Wt Readings from Last 3 Encounters:  07/14/18 183 lb (83 kg)  12/21/17 195 lb (88.5 kg)  05/17/17  193 lb 0.6 oz (87.6 kg)     GEN: Patient is in no acute distress HEENT: Normal NECK: No JVD; No carotid bruits LYMPHATICS: No lymphadenopathy CARDIAC: Hear sounds regular, 2/6 systolic murmur at the apex. RESPIRATORY:  Clear to auscultation without rales, wheezing or rhonchi  ABDOMEN: Soft, non-tender, non-distended MUSCULOSKELETAL:  No edema; No deformity  SKIN: Warm and dry NEUROLOGIC:  Alert and oriented x 3 PSYCHIATRIC:  Normal affect   Signed, [name], MD  07/14/2018 10:42 AM    [place]

## 2018-07-14 NOTE — Addendum Note (Signed)
Addended by: Mattie Marlin on: 07/14/2018 01:55 PM   Modules accepted: Orders

## 2018-07-14 NOTE — Patient Instructions (Signed)
Medication Instructions:  Your physician recommends that you continue on your current medications as directed. Please refer to the Current Medication list given to you today.  Labwork: Your physician recommends that you have the following labs drawn: BMP, TSH, liver and lipid panel.  Testing/Procedures: None  Follow-Up: Your physician recommends that you schedule a follow-up appointment in: 6 months  Any Other Special Instructions Will Be Listed Below (If Applicable).     If you need a refill on your cardiac medications before your next appointment, please call your pharmacy.   [place], RN, BSN

## 2018-07-15 LAB — LIPID PANEL
CHOLESTEROL TOTAL: 183 mg/dL (ref 100–199)
Chol/HDL Ratio: 2.8 ratio (ref 0.0–4.4)
HDL: 65 mg/dL (ref >39)
LDL Calculated: 87 mg/dL (ref 0–99)
TRIGLYCERIDES: 156 mg/dL — AB (ref 0–149)
VLDL CHOLESTEROL CAL: 31 mg/dL (ref 5–40)

## 2018-07-15 LAB — BASIC METABOLIC PANEL
BUN / CREAT RATIO: 26 (ref 12–28)
BUN: 18 mg/dL (ref 8–27)
CALCIUM: 9.8 mg/dL (ref 8.7–10.3)
CHLORIDE: 98 mmol/L (ref 96–106)
CO2: 26 mmol/L (ref 20–29)
Creatinine, Ser: 0.68 mg/dL (ref 0.57–1.00)
GFR calc non Af Amer: 83 mL/min/{1.73_m2} (ref >59)
GFR, EST AFRICAN AMERICAN: 96 mL/min/{1.73_m2} (ref >59)
Glucose: 105 mg/dL — ABNORMAL HIGH (ref 65–99)
POTASSIUM: 3.8 mmol/L (ref 3.5–5.2)
SODIUM: 139 mmol/L (ref 134–144)

## 2018-07-15 LAB — HEPATIC FUNCTION PANEL
ALT: 27 IU/L (ref 0–32)
AST: 20 IU/L (ref 0–40)
Albumin: 4.5 g/dL (ref 3.5–4.7)
Alkaline Phosphatase: 71 IU/L (ref 39–117)
Bilirubin Total: 0.5 mg/dL (ref 0.0–1.2)
Bilirubin, Direct: 0.13 mg/dL (ref 0.00–0.40)
Total Protein: 6.8 g/dL (ref 6.0–8.5)

## 2018-07-15 LAB — TSH: TSH: 0.094 u[IU]/mL — ABNORMAL LOW (ref 0.450–4.500)

## 2018-07-24 DIAGNOSIS — Z23 Encounter for immunization: Secondary | ICD-10-CM | POA: Diagnosis not present

## 2018-07-24 DIAGNOSIS — W548XXA Other contact with dog, initial encounter: Secondary | ICD-10-CM | POA: Diagnosis not present

## 2018-07-24 DIAGNOSIS — H8309 Labyrinthitis, unspecified ear: Secondary | ICD-10-CM | POA: Diagnosis not present

## 2018-07-24 DIAGNOSIS — E669 Obesity, unspecified: Secondary | ICD-10-CM | POA: Diagnosis not present

## 2018-07-24 DIAGNOSIS — Z6833 Body mass index (BMI) 33.0-33.9, adult: Secondary | ICD-10-CM | POA: Diagnosis not present

## 2018-08-01 DIAGNOSIS — L821 Other seborrheic keratosis: Secondary | ICD-10-CM | POA: Diagnosis not present

## 2018-08-01 DIAGNOSIS — L570 Actinic keratosis: Secondary | ICD-10-CM | POA: Diagnosis not present

## 2018-08-07 DIAGNOSIS — R202 Paresthesia of skin: Secondary | ICD-10-CM | POA: Diagnosis not present

## 2018-08-07 DIAGNOSIS — R200 Anesthesia of skin: Secondary | ICD-10-CM | POA: Diagnosis not present

## 2018-08-07 DIAGNOSIS — R51 Headache: Secondary | ICD-10-CM | POA: Diagnosis not present

## 2018-08-07 DIAGNOSIS — R42 Dizziness and giddiness: Secondary | ICD-10-CM | POA: Diagnosis not present

## 2018-08-07 DIAGNOSIS — R0789 Other chest pain: Secondary | ICD-10-CM | POA: Diagnosis not present

## 2018-08-07 DIAGNOSIS — I517 Cardiomegaly: Secondary | ICD-10-CM | POA: Diagnosis not present

## 2018-08-08 DIAGNOSIS — I517 Cardiomegaly: Secondary | ICD-10-CM | POA: Diagnosis not present

## 2018-08-09 DIAGNOSIS — R200 Anesthesia of skin: Secondary | ICD-10-CM | POA: Diagnosis not present

## 2018-08-09 DIAGNOSIS — Z6833 Body mass index (BMI) 33.0-33.9, adult: Secondary | ICD-10-CM | POA: Diagnosis not present

## 2018-08-16 DIAGNOSIS — R2681 Unsteadiness on feet: Secondary | ICD-10-CM | POA: Insufficient documentation

## 2018-08-16 HISTORY — DX: Unsteadiness on feet: R26.81

## 2018-08-17 DIAGNOSIS — R200 Anesthesia of skin: Secondary | ICD-10-CM | POA: Diagnosis not present

## 2018-08-17 DIAGNOSIS — R42 Dizziness and giddiness: Secondary | ICD-10-CM | POA: Diagnosis not present

## 2018-08-22 DIAGNOSIS — E119 Type 2 diabetes mellitus without complications: Secondary | ICD-10-CM | POA: Diagnosis not present

## 2018-08-22 DIAGNOSIS — H35341 Macular cyst, hole, or pseudohole, right eye: Secondary | ICD-10-CM | POA: Diagnosis not present

## 2018-08-22 DIAGNOSIS — H35371 Puckering of macula, right eye: Secondary | ICD-10-CM | POA: Diagnosis not present

## 2018-08-22 DIAGNOSIS — Z961 Presence of intraocular lens: Secondary | ICD-10-CM | POA: Diagnosis not present

## 2018-09-08 DIAGNOSIS — J4520 Mild intermittent asthma, uncomplicated: Secondary | ICD-10-CM | POA: Diagnosis not present

## 2018-09-08 DIAGNOSIS — J310 Chronic rhinitis: Secondary | ICD-10-CM | POA: Diagnosis not present

## 2018-09-08 DIAGNOSIS — G4733 Obstructive sleep apnea (adult) (pediatric): Secondary | ICD-10-CM | POA: Diagnosis not present

## 2018-09-27 DIAGNOSIS — Z9181 History of falling: Secondary | ICD-10-CM | POA: Diagnosis not present

## 2018-09-27 DIAGNOSIS — E039 Hypothyroidism, unspecified: Secondary | ICD-10-CM | POA: Diagnosis not present

## 2018-09-27 DIAGNOSIS — I10 Essential (primary) hypertension: Secondary | ICD-10-CM | POA: Diagnosis not present

## 2018-09-27 DIAGNOSIS — Z6833 Body mass index (BMI) 33.0-33.9, adult: Secondary | ICD-10-CM | POA: Diagnosis not present

## 2018-09-27 DIAGNOSIS — E119 Type 2 diabetes mellitus without complications: Secondary | ICD-10-CM | POA: Diagnosis not present

## 2018-09-27 DIAGNOSIS — Z23 Encounter for immunization: Secondary | ICD-10-CM | POA: Diagnosis not present

## 2018-09-27 DIAGNOSIS — Z1331 Encounter for screening for depression: Secondary | ICD-10-CM | POA: Diagnosis not present

## 2018-10-05 ENCOUNTER — Ambulatory Visit (INDEPENDENT_AMBULATORY_CARE_PROVIDER_SITE_OTHER): Payer: Medicare Other | Admitting: Neurology

## 2018-10-05 ENCOUNTER — Encounter: Payer: Self-pay | Admitting: Neurology

## 2018-10-05 VITALS — BP 136/92 | HR 82 | Ht 61.0 in | Wt 191.0 lb

## 2018-10-05 DIAGNOSIS — Z8673 Personal history of transient ischemic attack (TIA), and cerebral infarction without residual deficits: Secondary | ICD-10-CM | POA: Diagnosis not present

## 2018-10-05 DIAGNOSIS — I2510 Atherosclerotic heart disease of native coronary artery without angina pectoris: Secondary | ICD-10-CM | POA: Diagnosis not present

## 2018-10-05 NOTE — Progress Notes (Signed)
Subjective:    Patient ID: Vanessa Johnson is a 82 y.o. female.  HPI     Star Age, MD, PhD [facility] [address] [place], [place] [address]  Dear Dr. Truman Hayward:  I saw your patient, Vanessa Johnson, upon your kind request in my neurologic clinic today for initial consultation of her left facial numbness. The patient is unaccompanied today. As you know, Vanessa Johnson is an 82 year old right-handed woman with an underlying medical history of low back pain, obesity, COPD, diabetes, hypertension, hyperlipidemia, OSA and vertigo, who presented to the emergency room in October with a one-week history of left facial numbness. She had a head CT in the emergency room on 08/07/2018: IMPRESSION: 1. Senescent changes without acute finding. 2. Chronic nasal polyps as described. I also reviewed emergency room records from [facility]. She was seen by ENT on 08/16/2018 through [facility]. She was advised to use meclizine as needed and follow-up as needed. She had an appointment with ophthalmology on 08/22/2018 through [facility]. She reports that her facial numbness has been improving. She has not had any recent fall thankfully. She has no sustained weakness. She does report numbness in both lower extremities for quite some time, up to the mid shin areas, seems a little worse on the left than the right. She has had fairly good diabetes control lately, latest A1c per her report was less than 7. She had a brain MRI without contrast on 08/17/2018 with negative results. I was able to review her report. She uses a cane in her right hand. She fell in December 2018 and cracked a few ribs she states. She was walking up some stairs to get to her granddaughter's house. She then fell in spring 2019 in a parking lot, her foot caught on one of the parking spot humps. She sprained or bruised her right wrist. She is widowed, husband passed away in 02/17/04, she is retired and her  niece lives with her. She has 3 children, daughter in [place], 2 sons and [place], [place]. she drives without problems but typically for longer distance driving she has her sister with her. She sees her cardiologist on a 6 monthly basis, next appointment in February 17, 2023 next year. She had a left heart cath last year which showed 40-50% stenosis in the right coronary. She has a prior history of statin intolerance. She is supposed to be on a baby aspirin but admits that she does not always take it consistently. She uses her CPAP faithfully and cannot sleep without it. She had been losing weight but then gained weight, she was treated with steroids and reports that she had a tremendous increase in appetite after that. She had a transthoracic echocardiogram in April 2018 with rather benign findings, trace MR, normal LV size and function.  Her Past Medical History Is Significant For: Past Medical History:  Diagnosis Date  . Allergic rhinitis   . Back pain    left lower back  . Benign essential HTN   . BMI 34.0-34.9,adult   . Bronchial asthma   . CAD (coronary artery disease)    Heart cath 01/2017 [facility]  . Diabetes mellitus ([place])   . Gastritis    Dr. Orlena Sheldon- EGD 02/16/2017  . GERD (gastroesophageal reflux disease)   . Hyperlipidemia   . Hypothyroid   . Ischiogluteal bursitis of left side   . Pre-ulcerative corn or callous   . Sleep apnea    On CPAP  .  Vitamin D deficiency     Her Past Surgical History Is Significant For: Past Surgical History:  Procedure Laterality Date  . BREAST BIOPSY  1970, 1980, and 2000  . CATARACT EXTRACTION, BILATERAL  2016  . CHOLECYSTECTOMY  2017  . DILATION AND CURETTAGE OF UTERUS  1996  . [place]  . LEFT HEART CATH AND CORONARY ANGIOGRAPHY  01/2017   [facility]- No intervention  . TONSILLECTOMY  1965  . TOTAL HIP ARTHROPLASTY Bilateral    left- 2005 and right- 2010    Her Family History Is Significant  For: Family History  Problem Relation Age of Onset  . Diabetes Mother   . Heart disease Mother   . Hypertension Mother   . Heart attack Mother   . Emphysema Father   . Hypertension Father   . Heart attack Father   . Breast cancer Sister   . Diabetes Sister   . Emphysema Sister   . COPD Sister   . Asthma Sister   . Hypertension Sister   . Lung cancer Brother   . Diabetes Brother   . Emphysema Brother   . COPD Brother   . Asthma Brother   . Heart disease Brother     Her Social History Is Significant For: Social History   Socioeconomic History  . Marital status: Widowed    Spouse name: Not on file  . Number of children: Not on file  . Years of education: Not on file  . Highest education level: Not on file  Occupational History  . Not on file  Social Needs  . Financial resource strain: Not on file  . Food insecurity:    Worry: Not on file    Inability: Not on file  . Transportation needs:    Medical: Not on file    Non-medical: Not on file  Tobacco Use  . Smoking status: Former Research scientist (life sciences)  . Smokeless tobacco: Never Used  Substance and Sexual Activity  . Alcohol use: No  . Drug use: No  . Sexual activity: Not on file  Lifestyle  . Physical activity:    Days per week: Not on file    Minutes per session: Not on file  . Stress: Not on file  Relationships  . Social connections:    Talks on phone: Not on file    Gets together: Not on file    Attends religious service: Not on file    Active member of club or organization: Not on file    Attends meetings of clubs or organizations: Not on file    Relationship status: Not on file  Other Topics Concern  . Not on file  Social History Narrative  . Not on file    Her Allergies Are:  Allergies  Allergen Reactions  . Tussionex Pennkinetic Er [Hydrocod Polst-Cpm Polst Er] Swelling and Rash    Throat swelling  . Cefuroxime Axetil Other (See Comments)    Unknown  . Codeine Other (See Comments)    Unknown   .  Iohexol      Code: RASH   . Sulfamethoxazole Other (See Comments)    Unknown  . Cephalosporins Rash  . Hydrocodone-Chlorpheniramine Rash and Swelling  . Moxifloxacin Rash  :   Her Current Medications Are:  Outpatient Encounter Medications as of 10/05/2018  Medication Sig  . albuterol (PROVENTIL) (2.5 MG/3ML) 0.083% nebulizer solution   . aspirin EC 81 MG tablet Take 1 tablet (81 mg total) by mouth  daily.  . calcium carbonate (CALCIUM 600) 600 MG TABS tablet Take by mouth.  . cyclobenzaprine (FLEXERIL) 10 MG tablet Take by mouth.  . DOCOSAHEXAENOIC ACID PO Take 1 g by mouth.  . FIBER SELECT [place] by mouth.  . hydrochlorothiazide (HYDRODIURIL) 25 MG tablet Take 1 tablet (25 mg total) by mouth daily as needed. 1-2 tabs as needed daily for swelling  . levalbuterol (XOPENEX) 0.31 MG/3ML nebulizer solution Take 1 ampule by nebulization every 4 (four) hours as needed for Wheezing.  Marland Kitchen levothyroxine (SYNTHROID, LEVOTHROID) 100 MCG tablet   . losartan (COZAAR) 100 MG tablet Take 1 tablet (100 mg total) by mouth daily.  . meclizine (ANTIVERT) 25 MG tablet Take 25 mg by mouth every 8 (eight) hours as needed. Every 8-12 hours as needed  . metFORMIN (GLUCOPHAGE) 500 MG tablet Take 500 mg by mouth 2 (two) times daily.   . metoprolol succinate (TOPROL-XL) 100 MG 24 hr tablet Take 1 tablet (100 mg total) by mouth daily.  . montelukast (SINGULAIR) 10 MG tablet Take 10 mg by mouth.  . nitroGLYCERIN (NITROSTAT) 0.4 MG SL tablet Place 1 tablet (0.4 mg total) under the tongue every 5 (five) minutes as needed. May use up to 3 tabs  . nystatin-triamcinolone (MYCOLOG II) cream Apply 1 application topically 3 (three) times daily.  . pantoprazole (PROTONIX) 40 MG tablet Take 40 mg by mouth daily.  . polyethylene glycol (MIRALAX / GLYCOLAX) packet Take 17 g by mouth.  . rosuvastatin (CRESTOR) 10 MG tablet Take 0.5 tablets (5 mg total) by mouth daily.  . Vitamin D, Ergocalciferol, (DRISDOL) 50000  units CAPS capsule Take by mouth.  . vitamin E 400 UNIT capsule Take by mouth.  . [DISCONTINUED] esomeprazole (NEXIUM) 40 MG capsule Take by mouth.  . [DISCONTINUED] ranitidine (ZANTAC) 300 MG tablet Take 1 tablet by mouth 2 (two) times daily as needed.  . [DISCONTINUED] triamcinolone cream (KENALOG) 0.1 % Apply 1 application topically 3 (three) times daily as needed.   No facility-administered encounter medications on file as of 10/05/2018.   :   Review of Systems:  Out of a complete 14 point review of systems, all are reviewed and negative with the exception of these symptoms as listed below:  Review of Systems  Neurological:       Pt presents today to discuss lingering numbness in her head. Pt had a bout of vertigo which resolved but pt has residual numbness in her head. The numbness is improving.    Objective:  Neurological Exam  Physical Exam Physical Examination:   Vitals:   10/05/18 1001  BP: (!) 136/92  Pulse: 82    General Examination: The patient is a very pleasant 82 y.o. female in no acute distress. She appears well-developed and well-nourished and well groomed.   HEENT: Normocephalic, atraumatic, pupils are equal, round and reactive to light and accommodation. Funduscopic exam is normal with sharp disc margins noted. Extraocular tracking is good without limitation to gaze excursion or nystagmus noted. Normal smooth pursuit is noted. Hearing is grossly intact. Tympanic membranes are clear bilaterally. Face is symmetric with normal facial animation and normal facial sensation. Speech is clear with no dysarthria noted. There is no hypophonia. There is no lip, neck/head, jaw or voice tremor. Neck is supple with full range of passive and active motion. There are no carotid bruits on auscultation. Oropharynx exam reveals: mild mouth dryness, adequate dental hygiene and moderate airway crowding. Tongue protrudes centrally and palate elevates symmetrically.  Chest:  Clear to  auscultation without wheezing, rhonchi or crackles noted.  Heart: S1+S2+0, regular and normal without murmurs, rubs or gallops noted.   Abdomen: Soft, non-tender and non-distended with normal bowel sounds appreciated on auscultation.  Extremities: There is no pitting edema in the distal lower extremities bilaterally. Pedal pulses are intact.  Skin: Warm and dry without trophic changes noted.  Musculoskeletal: exam reveals no obvious joint deformities, tenderness or joint swelling or erythema.   Neurologically:  Mental status: The patient is awake, alert and oriented in all 4 spheres. Her immediate and remote memory, attention, language skills and fund of knowledge are appropriate. There is no evidence of aphasia, agnosia, apraxia or anomia. Speech is clear with normal prosody and enunciation. Thought process is linear. Mood is normal and affect is normal.  Cranial nerves II - XII are as described above under HEENT exam. In addition: shoulder shrug is normal with equal shoulder height noted. Motor exam: Normal bulk, strength and tone is noted. There is no drift, tremor or rebound. Romberg is negative. Reflexes are 1+ in the upper extremities, trace in the knees and absent in the ankles, toes are downgoing. Fine motor skills are globally fairly well preserved. Cerebellar testing: No dysmetria or intention tremor on finger to nose testing.  Sensory exam: intact to light touch, pinprick, vibration, temperature sense in the upper and lower extremities, With the exception of decreased pinprick and temperature sense in the distal lower extremities bilaterally, up to one hand width above the ankles bilaterally, left slightly worse than right.  Gait, station and balance: She stands with no significant difficulty, she is able to walk without her cane and uses her cane well with the right hand, no limp, no trouble turning.  Assessment and Plan:   In summary, Vanessa Johnson is a very pleasant 82 y.o.-year  old female with an underlying medical history of low back pain, obesity, COPD, diabetes, hypertension, hyperlipidemia, OSA and vertigo, who presents for evaluation of her left facial numbness which occurred in October 2019. Her symptoms have improved. She has findings and symptoms in keeping with peripheral neuropathy, likely diabetic neuropathy, thankfully without significant pain reported. She has an otherwise nonfocal neurological exam and feels improved. She has a history of vertigo for which she has seen ENT. She may have had a TIA, it is difficult to say for sure. Her CT was negative and brain MRI recently were negative. She does have multiple risk factors for vascular disease and stroke risk factors in general including hypertension, hyperlipidemia, obesity, OSA and diabetes. She is advised to be consistent with her baby aspirin use daily. She is furthermore advised to keep good diabetes care and be mindful of her risk of diabetic neuropathy. She already has some signs of it but thankfully has no significant pain condition with it. She sees cardiology on a 6 monthly basis. I would like to add a carotid Doppler study for completion. We can arrange for this locally in [place] as per her request, we will call her with her test result in so long as her carotid Doppler study is reassuring she can follow-up with me on an as-needed basis. We talked at length about secondary prevention and risk factor reduction today. She is encouraged to stay well hydrated with water and work on weight loss and continued to be fully compliant with her CPAP. I answered all her questions today and the patient was in agreement with the above.  Thank you very much for allowing me to participate  in the care of this nice patient. If I can be of any further assistance to you please do not hesitate to call me at [phone].  Sincerely,   Star Age, MD, PhD

## 2018-10-05 NOTE — Patient Instructions (Addendum)
It was nice to meet you today, your exam is reassuring. You may have had a mini stroke/TIA in October 2019. You do have risk factors for stroke and heart disease.  Continue exercising regularly and take your medications as directed. As discussed, secondary prevention is key after a stroke. This means: taking care of blood sugar values or diabetes management (A1c goal of less than 7.0), good blood pressure (hypertension) control and optimizing cholesterol management (with LDL goal of less than 70), exercising daily or regularly within your own mobility limitations of course, and overall cardiovascular risk factor reduction, which includes screening for and treatment of obstructive sleep apnea (OSA) and weight management.   Please work on weight loss, and take your aspirin 81 mg daily.  Please hydrate well with water.  We will request a carotid Doppler ultrasound and you with the test results. Likely, they will call to schedule after insurance authorizes the test. Most likely, you will be able to get it done in [place].  So long as the results are okay, I can see you back as needed.  You do have mild signs of diabetic neuropathy in the legs, ie nerve damage, but thankfully, you significant pain with it. Strict diabetes control is key.

## 2018-10-06 DIAGNOSIS — G4733 Obstructive sleep apnea (adult) (pediatric): Secondary | ICD-10-CM | POA: Diagnosis not present

## 2018-10-09 ENCOUNTER — Encounter: Payer: Self-pay | Admitting: Neurology

## 2018-10-09 DIAGNOSIS — Z8673 Personal history of transient ischemic attack (TIA), and cerebral infarction without residual deficits: Secondary | ICD-10-CM | POA: Diagnosis not present

## 2018-10-09 DIAGNOSIS — I6523 Occlusion and stenosis of bilateral carotid arteries: Secondary | ICD-10-CM | POA: Diagnosis not present

## 2018-10-10 ENCOUNTER — Telehealth: Payer: Self-pay

## 2018-10-10 NOTE — Telephone Encounter (Signed)
Received carotid US results from [facility].

## 2018-10-10 NOTE — Telephone Encounter (Signed)
I called pt to discuss her results. No answer, left a message asking her to call me back. 

## 2018-10-10 NOTE — Telephone Encounter (Signed)
I reviewed her carotid Doppler ultrasound test results which was done through [facility] on 10/09/2018. Impression: Mild bilateral carotid atherosclerosis. No hemodynamically significant ICA stenosis. Degree of narrowing less than 50% bilaterally by ultrasound criteria. Patent antegrade vertebral flow bilaterally.  Please call patient and advise her that her carotid ultrasound showed no significant hardening of the arteries of the main neck arteries. As discussed, she can follow-up with her primary care physician.

## 2018-10-11 NOTE — Telephone Encounter (Signed)
I called pt and advised her of the carotid doppler results. Pt will follow up with her PCP. Pt verbalized understanding of results. Pt had no questions at this time but was encouraged to call back if questions arise.

## 2018-10-23 DIAGNOSIS — Z6835 Body mass index (BMI) 35.0-35.9, adult: Secondary | ICD-10-CM | POA: Diagnosis not present

## 2018-10-23 DIAGNOSIS — J019 Acute sinusitis, unspecified: Secondary | ICD-10-CM | POA: Diagnosis not present

## 2018-10-23 DIAGNOSIS — Z79899 Other long term (current) drug therapy: Secondary | ICD-10-CM | POA: Diagnosis not present

## 2018-11-02 DIAGNOSIS — Z6833 Body mass index (BMI) 33.0-33.9, adult: Secondary | ICD-10-CM | POA: Diagnosis not present

## 2018-11-02 DIAGNOSIS — Z79899 Other long term (current) drug therapy: Secondary | ICD-10-CM | POA: Diagnosis not present

## 2018-11-02 DIAGNOSIS — J209 Acute bronchitis, unspecified: Secondary | ICD-10-CM | POA: Diagnosis not present

## 2018-11-20 DIAGNOSIS — J40 Bronchitis, not specified as acute or chronic: Secondary | ICD-10-CM

## 2018-11-20 DIAGNOSIS — H6981 Other specified disorders of Eustachian tube, right ear: Secondary | ICD-10-CM | POA: Insufficient documentation

## 2018-11-20 DIAGNOSIS — H6991 Unspecified Eustachian tube disorder, right ear: Secondary | ICD-10-CM

## 2018-11-20 HISTORY — DX: Bronchitis, not specified as acute or chronic: J40

## 2018-11-20 HISTORY — DX: Other specified disorders of eustachian tube, right ear: H69.81

## 2018-11-20 HISTORY — DX: Unspecified eustachian tube disorder, right ear: H69.91

## 2018-11-22 DIAGNOSIS — E785 Hyperlipidemia, unspecified: Secondary | ICD-10-CM | POA: Diagnosis not present

## 2018-11-22 DIAGNOSIS — Z0000 Encounter for general adult medical examination without abnormal findings: Secondary | ICD-10-CM | POA: Diagnosis not present

## 2018-11-22 DIAGNOSIS — Z1231 Encounter for screening mammogram for malignant neoplasm of breast: Secondary | ICD-10-CM | POA: Diagnosis not present

## 2018-11-22 DIAGNOSIS — Z9181 History of falling: Secondary | ICD-10-CM | POA: Diagnosis not present

## 2018-11-22 DIAGNOSIS — Z1331 Encounter for screening for depression: Secondary | ICD-10-CM | POA: Diagnosis not present

## 2018-12-27 DIAGNOSIS — E785 Hyperlipidemia, unspecified: Secondary | ICD-10-CM | POA: Diagnosis not present

## 2018-12-27 DIAGNOSIS — I10 Essential (primary) hypertension: Secondary | ICD-10-CM | POA: Diagnosis not present

## 2018-12-27 DIAGNOSIS — J45909 Unspecified asthma, uncomplicated: Secondary | ICD-10-CM | POA: Diagnosis not present

## 2018-12-27 DIAGNOSIS — D649 Anemia, unspecified: Secondary | ICD-10-CM | POA: Diagnosis not present

## 2018-12-27 DIAGNOSIS — Z79899 Other long term (current) drug therapy: Secondary | ICD-10-CM | POA: Diagnosis not present

## 2018-12-27 DIAGNOSIS — Z6833 Body mass index (BMI) 33.0-33.9, adult: Secondary | ICD-10-CM | POA: Diagnosis not present

## 2018-12-27 DIAGNOSIS — E119 Type 2 diabetes mellitus without complications: Secondary | ICD-10-CM | POA: Diagnosis not present

## 2019-01-02 DIAGNOSIS — Z1231 Encounter for screening mammogram for malignant neoplasm of breast: Secondary | ICD-10-CM | POA: Diagnosis not present

## 2019-01-04 DIAGNOSIS — D509 Iron deficiency anemia, unspecified: Secondary | ICD-10-CM | POA: Diagnosis not present

## 2019-01-04 DIAGNOSIS — R079 Chest pain, unspecified: Secondary | ICD-10-CM | POA: Diagnosis not present

## 2019-01-05 ENCOUNTER — Encounter: Payer: Self-pay | Admitting: Cardiology

## 2019-01-05 ENCOUNTER — Other Ambulatory Visit: Payer: Self-pay

## 2019-01-05 ENCOUNTER — Ambulatory Visit (INDEPENDENT_AMBULATORY_CARE_PROVIDER_SITE_OTHER): Payer: Medicare Other | Admitting: Cardiology

## 2019-01-05 VITALS — BP 122/60 | HR 89 | Ht 61.0 in | Wt 180.2 lb

## 2019-01-05 DIAGNOSIS — R0602 Shortness of breath: Secondary | ICD-10-CM | POA: Diagnosis not present

## 2019-01-05 DIAGNOSIS — G4733 Obstructive sleep apnea (adult) (pediatric): Secondary | ICD-10-CM | POA: Diagnosis not present

## 2019-01-05 DIAGNOSIS — R079 Chest pain, unspecified: Secondary | ICD-10-CM | POA: Diagnosis not present

## 2019-01-05 DIAGNOSIS — E782 Mixed hyperlipidemia: Secondary | ICD-10-CM

## 2019-01-05 DIAGNOSIS — E119 Type 2 diabetes mellitus without complications: Secondary | ICD-10-CM | POA: Diagnosis not present

## 2019-01-05 DIAGNOSIS — I2510 Atherosclerotic heart disease of native coronary artery without angina pectoris: Secondary | ICD-10-CM | POA: Diagnosis not present

## 2019-01-05 DIAGNOSIS — I10 Essential (primary) hypertension: Secondary | ICD-10-CM | POA: Diagnosis not present

## 2019-01-05 DIAGNOSIS — I209 Angina pectoris, unspecified: Secondary | ICD-10-CM

## 2019-01-05 LAB — CBC
HEMOGLOBIN: 8 g/dL — AB (ref 11.1–15.9)
Hematocrit: 27.4 % — ABNORMAL LOW (ref 34.0–46.6)
MCH: 19.8 pg — ABNORMAL LOW (ref 26.6–33.0)
MCHC: 29.2 g/dL — ABNORMAL LOW (ref 31.5–35.7)
MCV: 68 fL — ABNORMAL LOW (ref 79–97)
Platelets: 521 10*3/uL — ABNORMAL HIGH (ref 150–450)
RBC: 4.04 x10E6/uL (ref 3.77–5.28)
RDW: 15.9 % — ABNORMAL HIGH (ref 11.7–15.4)
WBC: 7.1 10*3/uL (ref 3.4–10.8)

## 2019-01-05 LAB — BASIC METABOLIC PANEL
BUN/Creatinine Ratio: 26 (ref 12–28)
BUN: 20 mg/dL (ref 8–27)
CO2: 22 mmol/L (ref 20–29)
Calcium: 9.9 mg/dL (ref 8.7–10.3)
Chloride: 100 mmol/L (ref 96–106)
Creatinine, Ser: 0.77 mg/dL (ref 0.57–1.00)
GFR calc Af Amer: 84 mL/min/{1.73_m2} (ref >59)
GFR, EST NON AFRICAN AMERICAN: 73 mL/min/{1.73_m2} (ref >59)
Glucose: 110 mg/dL — ABNORMAL HIGH (ref 65–99)
Potassium: 4.5 mmol/L (ref 3.5–5.2)
Sodium: 139 mmol/L (ref 134–144)

## 2019-01-05 NOTE — Progress Notes (Addendum)
Cardiology Office Note:    Date:  01/05/2019   ID:  Vanessa Johnson, DOB 02-16-1936, MRN [id number]  PCP:  [name], MD  Cardiologist:  [name], MD   Referring MD: [name], MD    ASSESSMENT:    1. Angina pectoris ([place])   2. Coronary artery disease involving native coronary artery of native heart without angina pectoris   3. Essential hypertension   4. Diabetes mellitus without complication ([place])   5. Mixed dyslipidemia   6. Obstructive sleep apnea of adult    PLAN:    In order of problems listed above:  1. Secondary prevention stressed with the patient.  Importance of compliance with diet and medication stressed and she vocalized understanding.  Her blood pressure is stable. 2. She mentions to me that occasionally her blood pressure will be low like in the 21H systolic and she feels weak.  For this reason I have asked her to stop her hydrochlorothiazide. 3. Her symptoms are very concerning and suggestive of angina pectoris.  Sublingual nitroglycerin prescription was sent, its protocol and 911 protocol explained and the patient vocalized understanding questions were answered to the patient's satisfaction 4. In view of the patient's symptoms, I discussed with the patient options for evaluation. Invasive and noninvasive options were given to the patient. I discussed stress testing and coronary angiography and left heart catheterization at length. Benefits, pros and cons of each approach were discussed at length. Patient had multiple questions which were answered to the patient's satisfaction. Patient opted for invasive evaluation and we will set up for coronary angiography and left heart catheterization. Further recommendations will be made based on the findings with coronary angiography. In the interim if the patient has any significant symptoms in hospital to the nearest emergency room. 5. I discussed her case with her primary care physician at extensive length.  I discussed  also with her gastroenterologist Dr. Melina Copa.  Her stool was negative for occult blood yesterday at his office.  He is fine with [place] proceeding to coronary angiography.  He feels that if she needs dual antiplatelet therapy then we should be able to proceed with dual antiplatelet therapy if she has any significant stenosis that may need intervention.  I discussed benefits and potential risks with the patient at extensive length and she vocalized understanding.  Further recommendations will be made based on the findings of the coronary angiography.  Total time for the above evaluation was 40 minutes.  01/08/2019 at 4:39 PM I got the lab work back 8.  I spoke to Dr. Melina Copa her gastroenterologist.  We concur that we will hold off on the heart catheterization at this point and he will transfuse her and then I will assess her symptoms and take further decisions.  Her primary care doctor also concurs. Signed [facility]     Medication Adjustments/Labs and Tests Ordered: Current medicines are reviewed at length with the patient today.  Concerns regarding medicines are outlined above.  No orders of the defined types were placed in this encounter.  No orders of the defined types were placed in this encounter.    No chief complaint on file.    History of Present Illness:    Vanessa Johnson is a 83 y.o. female.  She has past medical history of essential hypertension, diabetes mellitus and dyslipidemia.  She leads a sedentary lifestyle.  She is recently being evaluated for anemia.  She saw her gastroenterologist yesterday and her hemoglobin was 8.9.  She mentions  to me that upon exertion she has chest tightness.  No orthopnea or PND.  Her chest tightness is of significant concern as it affects her quality of life and ambulation.  At the time of my evaluation, the patient is alert awake oriented and in no distress.   Past Medical History:  Diagnosis Date  . Allergic rhinitis   . Back pain    left lower  back  . Benign essential HTN   . BMI 34.0-34.9,adult   . Bronchial asthma   . CAD (coronary artery disease)    Heart cath 01/2017 [facility]  . Diabetes mellitus ([place])   . Gastritis    Dr. Orlena Sheldon- EGD 2018  . GERD (gastroesophageal reflux disease)   . Hyperlipidemia   . Hypothyroid   . Ischiogluteal bursitis of left side   . Pre-ulcerative corn or callous   . Sleep apnea    On CPAP  . Vitamin D deficiency     Past Surgical History:  Procedure Laterality Date  . BREAST BIOPSY  1970, 1980, and 2000  . CATARACT EXTRACTION, BILATERAL  2016  . CHOLECYSTECTOMY  2017  . DILATION AND CURETTAGE OF UTERUS  1996  . [place]  . LEFT HEART CATH AND CORONARY ANGIOGRAPHY  01/2017   [facility]- No intervention  . TONSILLECTOMY  1965  . TOTAL HIP ARTHROPLASTY Bilateral    left- 2005 and right- 2010    Current Medications: Current Meds  Medication Sig  . albuterol (PROVENTIL) (2.5 MG/3ML) 0.083% nebulizer solution   . aspirin EC 81 MG tablet Take 1 tablet (81 mg total) by mouth daily.  . calcium carbonate (CALCIUM 600) 600 MG TABS tablet Take by mouth.  . cyclobenzaprine (FLEXERIL) 10 MG tablet Take 10 mg by mouth 3 (three) times daily as needed.   . DOCOSAHEXAENOIC ACID PO Take 1 g by mouth daily.   Marland Kitchen [place] by mouth.  . hydrochlorothiazide (HYDRODIURIL) 25 MG tablet Take 1 tablet (25 mg total) by mouth daily as needed. 1-2 tabs as needed daily for swelling  . levalbuterol (XOPENEX) 0.31 MG/3ML nebulizer solution Take 1 ampule by nebulization every 4 (four) hours as needed for Wheezing.  Marland Kitchen levothyroxine (SYNTHROID, LEVOTHROID) 100 MCG tablet Take 100 mcg by mouth daily before breakfast.   . losartan (COZAAR) 25 MG tablet Take 25 mg by mouth daily.  . meclizine (ANTIVERT) 25 MG tablet Take 25 mg by mouth every 8 (eight) hours as needed. Every 8-12 hours as needed  . metFORMIN (GLUCOPHAGE) 500 MG tablet Take  500 mg by mouth 2 (two) times daily.   . metoprolol succinate (TOPROL-XL) 50 MG 24 hr tablet Take 50 mg by mouth daily. Take with or immediately following a meal.  . montelukast (SINGULAIR) 10 MG tablet Take 10 mg by mouth at bedtime.   . nitroGLYCERIN (NITROSTAT) 0.4 MG SL tablet Place 1 tablet (0.4 mg total) under the tongue every 5 (five) minutes as needed. May use up to 3 tabs  . nystatin-triamcinolone (MYCOLOG II) cream Apply 1 application topically 3 (three) times daily.  . pantoprazole (PROTONIX) 40 MG tablet Take 40 mg by mouth daily.  . polyethylene glycol (MIRALAX / GLYCOLAX) packet Take 17 g by mouth.  . rosuvastatin (CRESTOR) 10 MG tablet Take 0.5 tablets (5 mg total) by mouth daily.  . Vitamin D, Ergocalciferol, (DRISDOL) 50000 units CAPS capsule Take by mouth.  . vitamin E 400 UNIT capsule Take by mouth.  Allergies:   Tussionex pennkinetic er [hydrocod polst-cpm polst er]; Cefuroxime axetil; Codeine; Iohexol; Sulfamethoxazole; Cephalosporins; Hydrocodone-chlorpheniramine; and Moxifloxacin   Social History   Socioeconomic History  . Marital status: Widowed    Spouse name: Not on file  . Number of children: Not on file  . Years of education: Not on file  . Highest education level: Not on file  Occupational History  . Not on file  Social Needs  . Financial resource strain: Not on file  . Food insecurity:    Worry: Not on file    Inability: Not on file  . Transportation needs:    Medical: Not on file    Non-medical: Not on file  Tobacco Use  . Smoking status: Former Research scientist (life sciences)  . Smokeless tobacco: Never Used  Substance and Sexual Activity  . Alcohol use: No  . Drug use: No  . Sexual activity: Not on file  Lifestyle  . Physical activity:    Days per week: Not on file    Minutes per session: Not on file  . Stress: Not on file  Relationships  . Social connections:    Talks on phone: Not on file    Gets together: Not on file    Attends religious service: Not on  file    Active member of club or organization: Not on file    Attends meetings of clubs or organizations: Not on file    Relationship status: Not on file  Other Topics Concern  . Not on file  Social History Narrative  . Not on file     Family History: The patient's family history includes Asthma in her brother and sister; Breast cancer in her sister; COPD in her brother and sister; Diabetes in her brother, mother, and sister; Emphysema in her brother, father, and sister; Heart attack in her father and mother; Heart disease in her brother and mother; Hypertension in her father, mother, and sister; Lung cancer in her brother.  ROS:   Please see the history of present illness.    All other systems reviewed and are negative.  EKGs/Labs/Other Studies Reviewed:    The following studies were reviewed today: EKG reveals sinus rhythm and nonspecific ST-T changes.   Recent Labs: 07/14/2018: ALT 27; BUN 18; Creatinine, Ser 0.68; Potassium 3.8; Sodium 139; TSH 0.094  Recent Lipid Panel    Component Value Date/Time   CHOL 183 07/14/2018 1104   TRIG 156 (H) 07/14/2018 1104   HDL 65 07/14/2018 1104   CHOLHDL 2.8 07/14/2018 1104   LDLCALC 87 07/14/2018 1104    Physical Exam:    VS:  BP 122/60 (BP Location: Right Arm, Patient Position: Sitting, Cuff Size: Normal)   Pulse 89   Ht 5\' 1"  (1.549 m)   Wt 180 lb 3.2 oz (81.7 kg)   SpO2 99%   BMI 34.05 kg/m     Wt Readings from Last 3 Encounters:  01/05/19 180 lb 3.2 oz (81.7 kg)  10/05/18 191 lb (86.6 kg)  07/14/18 183 lb (83 kg)     GEN: Patient is in no acute distress HEENT: Normal NECK: No JVD; No carotid bruits LYMPHATICS: No lymphadenopathy CARDIAC: Hear sounds regular, 2/6 systolic murmur at the apex. RESPIRATORY:  Clear to auscultation without rales, wheezing or rhonchi  ABDOMEN: Soft, non-tender, non-distended MUSCULOSKELETAL:  No edema; No deformity  SKIN: Warm and dry NEUROLOGIC:  Alert and oriented x 3 PSYCHIATRIC:   Normal affect   Signed, [name], MD  01/05/2019 9:18 AM  [facility]

## 2019-01-05 NOTE — Patient Instructions (Signed)
Medication Instructions:  STOP taking hydrochlorathiazide If you need a refill on your cardiac medications before your next appointment, please call your pharmacy.   Lab work: Your physician recommends that you have a BMP and CBC drawn today.  If you have labs (blood work) drawn today and your tests are completely normal, you will receive your results only by: Marland Kitchen MyChart Message (if you have MyChart) OR . A paper copy in the mail If you have any lab test that is abnormal or we need to change your treatment, we will call you to review the results.  Testing/Procedures: An EKG was performed today  Your physician has requested that you have a cardiac catheterization. Cardiac catheterization is used to diagnose and/or treat various heart conditions. Doctors may recommend this procedure for a number of different reasons. The most common reason is to evaluate chest pain. Chest pain can be a symptom of coronary artery disease (CAD), and cardiac catheterization can show whether plaque is narrowing or blocking your heart's arteries. This procedure is also used to evaluate the valves, as well as measure the blood flow and oxygen levels in different parts of your heart. For further information please visit [email or web address]. Please follow instruction sheet, as given.    Follow-Up: At [facility], you and your health needs are our priority.  As part of our continuing mission to provide you with exceptional heart care, we have created designated Provider Care Teams.  These Care Teams include your primary Cardiologist (physician) and Advanced Practice Providers (APPs -  Physician Assistants and Nurse Practitioners) who all work together to provide you with the care you need, when you need it. You will need a follow up appointment 1 month after your procedure is complete.  Any Other Special Instructions Will Be Listed Below    [place] [address] Dept: [phone] Loc: [place]  01/05/2019  You are scheduled for a Cardiac Catheterization on Thursday, March 19 with Dr. Daneen Schick.  1. Please arrive at the [facility] (Main Entrance A) at [facility]: [address] [place], [place] [address] at 10:00 AM (This time is two hours before your procedure to ensure your preparation). Free valet parking service is available.   Special note: Every effort is made to have your procedure done on time. Please understand that emergencies sometimes delay scheduled procedures.  2. Diet: Do not eat solid foods after midnight.  The patient may have clear liquids until 5am upon the day of the procedure.  3. Labs:NONE  4. Medication instructions in preparation for your procedure:   Contrast Allergy: POSSIBLE not noted in current allergies but patient states she was given medication the last heart cath prior to procedure       Do not take metoprolol morning of procedure, bring with you to appt and take after you are done.    Do not take Diabetes Med Glucophage (Metformin) on the day of the procedure and HOLD 48 HOURS AFTER THE PROCEDURE.  On the morning of your procedure, take your Aspirin and any morning medicines NOT listed above.  You may use sips of water.  5. Plan for one night stay--bring personal belongings. 6. Bring a current list of your medications and current insurance cards. 7. You MUST have a responsible person to drive you home. 8. Someone MUST be with you the first 24 hours after you arrive home or your  discharge will be delayed. 9. Please wear clothes that are easy to get on and off and wear slip-on shoes.  Thank you for allowing [place] to care for you!   -- [facility]

## 2019-01-09 ENCOUNTER — Telehealth: Payer: Self-pay | Admitting: *Deleted

## 2019-01-09 DIAGNOSIS — D509 Iron deficiency anemia, unspecified: Secondary | ICD-10-CM | POA: Diagnosis not present

## 2019-01-09 NOTE — Telephone Encounter (Signed)
Faxed blood work over to Dr. Marisa Hua office.

## 2019-01-10 ENCOUNTER — Ambulatory Visit: Payer: Medicare Other | Admitting: Cardiology

## 2019-01-10 ENCOUNTER — Telehealth: Payer: Self-pay | Admitting: *Deleted

## 2019-01-10 DIAGNOSIS — D509 Iron deficiency anemia, unspecified: Secondary | ICD-10-CM | POA: Diagnosis not present

## 2019-01-10 NOTE — Telephone Encounter (Signed)
Copied from lab results 01/05/19: Notes recorded by Revankar, Reita Cliche, MD on 01/08/2019 at 4:40 PM EDT Please cancel her heart catheterization. Her hemoglobin is low and I spoke to her gastroenterologist who is going to transfuse her. Please let her see me after about a week or 2.  I have cancelled cardiac cath scheduled for 01/11/19. I have left a message for patient at home number to call me back to let her know I have cancelled cath.

## 2019-01-10 NOTE — Telephone Encounter (Signed)
I left detailed message at patient's home number listed (DPR), cardiac cath has been cancelled because of low hemoglobin, contact Dr Julien Nordmann office for follow up and any further recommendations.

## 2019-01-11 ENCOUNTER — Encounter (HOSPITAL_COMMUNITY): Admission: RE | Payer: Self-pay | Source: Home / Self Care

## 2019-01-11 ENCOUNTER — Ambulatory Visit (HOSPITAL_COMMUNITY)
Admission: RE | Admit: 2019-01-11 | Payer: Medicare Other | Source: Home / Self Care | Admitting: Interventional Cardiology

## 2019-01-11 SURGERY — LEFT HEART CATH AND CORONARY ANGIOGRAPHY
Anesthesia: LOCAL

## 2019-01-19 ENCOUNTER — Ambulatory Visit (INDEPENDENT_AMBULATORY_CARE_PROVIDER_SITE_OTHER): Payer: Medicare Other | Admitting: Cardiology

## 2019-01-19 ENCOUNTER — Other Ambulatory Visit: Payer: Self-pay

## 2019-01-19 ENCOUNTER — Encounter: Payer: Self-pay | Admitting: Cardiology

## 2019-01-19 ENCOUNTER — Telehealth: Payer: Self-pay

## 2019-01-19 VITALS — BP 118/74 | HR 92 | Ht 61.0 in | Wt 181.0 lb

## 2019-01-19 DIAGNOSIS — I209 Angina pectoris, unspecified: Secondary | ICD-10-CM | POA: Diagnosis not present

## 2019-01-19 DIAGNOSIS — I2510 Atherosclerotic heart disease of native coronary artery without angina pectoris: Secondary | ICD-10-CM

## 2019-01-19 DIAGNOSIS — G4733 Obstructive sleep apnea (adult) (pediatric): Secondary | ICD-10-CM

## 2019-01-19 DIAGNOSIS — I10 Essential (primary) hypertension: Secondary | ICD-10-CM | POA: Diagnosis not present

## 2019-01-19 NOTE — Telephone Encounter (Signed)
RN called Estill Bamberg (Dr. Marisa Hua nurse) about starting patient on a 81 mg aspirin. Dr. Melina Copa is out of office but message will be relayed and return call on Monday 01/22/19.

## 2019-01-19 NOTE — Progress Notes (Signed)
Virtual Visit via Telephone Note      Evaluation Performed:  Follow-up visit  This visit type was conducted due to national recommendations for restrictions regarding the COVID-19 Pandemic (e.g. social distancing).  This format is felt to be most appropriate for this patient at this time.  All issue s noted in this document were discussed and addressed.  No physical exam was performed (except for noted visual exam findings with Video Visits).  Please refer to the patient's chart (MyChart message for video visits and phone note for telephone visits) for the patient's consent to telehealth for [facility].  Date:  01/19/2019   ID:  Vanessa Johnson, DOB 09-May-1936, MRN [id number]  Patient Location:  [place] [address]   Provider location:   [place]  PCP:  [name], MD  Cardiologist:  No primary care provider on file.  Electrophysiologist:  None   Chief Complaint: Angina pectoris  History of Present Illness:    Vanessa Johnson is a 83 y.o. female who presents via audio/video conferencing for a telehealth visit today.  The patient was evaluated by me recently for chest pain suggesting of angina.  She was scheduled for coronary angiography but I realized that the blood work that I had done on the patient revealed a further drop in hemoglobin.  Therefore I called her gastroenterologist Dr. Melina Copa and he will arrange for a transfusion.  The patient received a transfusion.  This visit was over the phone.  She mentions to me that her anginal symptoms have improved significantly.  No chest pain orthopnea or PND.  Because of borderline blood pressure she has stopped taking losartan this time.  She told me that her systolic was less than 938 and after stopping losartan systolics have gotten better she is asymptomatic and has no symptoms of postural hypotension.  No chest pain orthopnea or PND.  She is taking metoprolol 50 mg daily and her doctor has advised her to continue  aspirin 81 mg daily.  At the time of my evaluation, the patient is alert awake oriented and in no distress.  The patient does not symptoms concerning for COVID-19 infection (fever, chills, cough, or new SHORTNESS OF BREATH).    Prior CV studies:   The following studies were reviewed today:  The patient denies any significant symptoms.  Review of systems was unremarkable and no positive findings.  Past Medical History:  Diagnosis Date   Allergic rhinitis    Back pain    left lower back   Benign essential HTN    BMI 34.0-34.9,adult    Bronchial asthma    CAD (coronary artery disease)    Heart cath 01/2017 [place]   Diabetes mellitus ([facility])    Gastritis    Dr. Orlena Sheldon- EGD 2018   GERD (gastroesophageal reflux disease)    Hyperlipidemia    Hypothyroid    Ischiogluteal bursitis of left side    Pre-ulcerative corn or callous    Sleep apnea    On CPAP   Vitamin D deficiency    Past Surgical History:  Procedure Laterality Date   BREAST BIOPSY  1970, 1980, and 2000   CATARACT EXTRACTION, BILATERAL  2016   CHOLECYSTECTOMY  2017   DILATION AND CURETTAGE OF UTERUS  1996   HEMORRHOIDECTOMY WITH HEMORRHOID BANDING  1970   LEFT HEART CATH AND CORONARY ANGIOGRAPHY  01/2017   [facility]- No intervention   TONSILLECTOMY  1965   TOTAL HIP ARTHROPLASTY Bilateral  left- 2005 and right- 2010     Current Meds  Medication Sig   albuterol (PROVENTIL) (2.5 MG/3ML) 0.083% nebulizer solution Take 2.5 mg by nebulization every 6 (six) hours as needed for wheezing or shortness of breath.    aspirin EC 81 MG tablet Take 1 tablet (81 mg total) by mouth daily.   b complex vitamins tablet Take 1 tablet by mouth daily.   calcium carbonate (CALCIUM 600) 600 MG TABS tablet Take by mouth.   cyclobenzaprine (FLEXERIL) 10 MG tablet Take 10 mg by mouth 3 (three) times daily as needed.    FIBER SELECT GUMMIES CHEW Chew 1 tablet by mouth daily.     levalbuterol (XOPENEX) 0.31 MG/3ML nebulizer solution Take 1 ampule by nebulization every 4 (four) hours as needed for wheezing or shortness of breath.    levothyroxine (SYNTHROID, LEVOTHROID) 100 MCG tablet Take 100 mcg by mouth daily before breakfast.    meclizine (ANTIVERT) 25 MG tablet Take 25 mg by mouth every 8 (eight) hours as needed. Every 8-12 hours as needed   metFORMIN (GLUCOPHAGE) 500 MG tablet Take 750 mg by mouth 2 (two) times daily.    metoprolol succinate (TOPROL-XL) 50 MG 24 hr tablet Take 50 mg by mouth daily. Take with or immediately following a meal.   montelukast (SINGULAIR) 10 MG tablet Take 10 mg by mouth at bedtime.    nitroGLYCERIN (NITROSTAT) 0.4 MG SL tablet Place 1 tablet (0.4 mg total) under the tongue every 5 (five) minutes as needed. May use up to 3 tabs   nystatin-triamcinolone (MYCOLOG II) cream Apply 1 application topically 2 (two) times daily as needed (irritation).    pantoprazole (PROTONIX) 40 MG tablet Take 40 mg by mouth daily.   polyethylene glycol (MIRALAX / GLYCOLAX) packet Take 17 g by mouth daily.    rosuvastatin (CRESTOR) 10 MG tablet Take 0.5 tablets (5 mg total) by mouth daily.   Vitamin D, Ergocalciferol, (DRISDOL) 50000 units CAPS capsule Take 50,000 Units by mouth every 7 (seven) days.    vitamin E 400 UNIT capsule Take 400 Units by mouth daily.      Allergies:   Tussionex pennkinetic er [hydrocod polst-cpm polst er]; Cefuroxime axetil; Codeine; Iohexol; Sulfamethoxazole; Cephalosporins; Hydrocodone-chlorpheniramine; and Moxifloxacin   Social History   Tobacco Use   Smoking status: Former Smoker   Smokeless tobacco: Never Used  Substance Use Topics   Alcohol use: No   Drug use: No     Family Hx: The patient's family history includes Asthma in her brother and sister; Breast cancer in her sister; COPD in her brother and sister; Diabetes in her brother, mother, and sister; Emphysema in her brother, father, and sister; Heart  attack in her father and mother; Heart disease in her brother and mother; Hypertension in her father, mother, and sister; Lung cancer in her brother.  ROS:   Please see the history of present illness.    Review of systems unremarkable. All other systems reviewed and are negative.   Labs/Other Tests and Data Reviewed:    Recent Labs: 07/14/2018: ALT 27; TSH 0.094 01/05/2019: BUN 20; Creatinine, Ser 0.77; Hemoglobin 8.0; Platelets 521; Potassium 4.5; Sodium 139   Recent Lipid Panel Lab Results  Component Value Date/Time   CHOL 183 07/14/2018 11:04 AM   TRIG 156 (H) 07/14/2018 11:04 AM   HDL 65 07/14/2018 11:04 AM   CHOLHDL 2.8 07/14/2018 11:04 AM   LDLCALC 87 07/14/2018 11:04 AM    Wt Readings from Last 3 Encounters:  01/19/19 181 lb (82.1 kg)  01/05/19 180 lb 3.2 oz (81.7 kg)  10/05/18 191 lb (86.6 kg)     Exam:    Vital Signs:  BP 118/74 (BP Location: Left Arm, Patient Position: Sitting, Cuff Size: Normal)    Pulse 92    Ht 5\' 1"  (1.549 m)    Wt 181 lb (82.1 kg)    BMI 34.20 kg/m    Well nourished, well developed female in no acute distress. She was comfortable with my discussion over the phone and had no shortness of breath.  ASSESSMENT & PLAN:    1.  Angina pectoris: Her symptoms have resolved with blood transfusion.  She is ambulating well.  She has been told to take nitroglycerin on a as needed basis.  She will check with her gastroenterologist about aspirin issue.  Currently she is not sure whether to take it or not.  I advised her to take 81 mg of coated aspirin on a daily basis if okay with her gastroenterologist.  She is going to call them.  She is also has an appointment early next week with her gastroenterologist.  She is happy that she is asymptomatic. 2.  Her blood pressure is stable.  She is not on ACE inhibitor.  She is a diabetic but because of borderline blood pressure she has discontinued medications and I agree with that approach. 3.  Lipids are followed by  her primary care physician.  Her primary care doctor performed blood work recently. 4.  Diet was discussed for dyslipidemia and diabetes mellitus and obesity.  Risks of obesity explained she vocalized understanding she plans to do better and lose weight. 5.  Follow-up appointment in a month or earlier if she has any concerns.  Patient had multiple questions which were answered to her satisfaction.  The phone call conversation lasted 12 minutes.  In addition documentation and other aspects of her care needed additional time.  COVID-19 Education: The signs and symptoms of COVID-19 were discussed with the patient and how to seek care for testing (follow up with PCP or arrange E-visit).  The importance of social distancing was discussed today.  Patient Risk:   After full review of this patients clinical status, I feel that they are at least moderate risk at this time.  Time:   Today, I have spent 12 minutes on the phone with the patient with telehealth technology discussing above-mentioned issues   Medication Adjustments/Labs and Tests Ordered: Current medicines are reviewed at length with the patient today.  Concerns regarding medicines are outlined above.  Tests Ordered: No orders of the defined types were placed in this encounter.  Medication Changes: No orders of the defined types were placed in this encounter.   Disposition:  in 12MO month(s)  Signed, [name], MD  01/19/2019 9:43 AM    [place]

## 2019-01-19 NOTE — Patient Instructions (Signed)
Medication Instructions:  Your physician recommends that you continue on your current medications as directed. Please refer to the Current Medication list given to you today.   If you need a refill on your cardiac medications before your next appointment, please call your pharmacy.   Lab work: NONE If you have labs (blood work) drawn today and your tests are completely normal, you will receive your results only by: Marland Kitchen MyChart Message (if you have MyChart) OR . A paper copy in the mail If you have any lab test that is abnormal or we need to change your treatment, we will call you to review the results.  Testing/Procedures: NONe  Follow-Up: At [facility], you and your health needs are our priority.  As part of our continuing mission to provide you with exceptional heart care, we have created designated Provider Care Teams.  These Care Teams include your primary Cardiologist (physician) and Advanced Practice Providers (APPs -  Physician Assistants and Nurse Practitioners) who all work together to provide you with the care you need, when you need it. You will need a follow up appointment in 1 months.

## 2019-01-22 ENCOUNTER — Ambulatory Visit: Payer: Medicare Other | Admitting: Cardiology

## 2019-01-25 ENCOUNTER — Telehealth: Payer: Self-pay

## 2019-01-25 NOTE — Telephone Encounter (Signed)
OK for patient to resume 81 mg aspirin once daily. Medications updated and patient has already been notified.

## 2019-01-30 DIAGNOSIS — I10 Essential (primary) hypertension: Secondary | ICD-10-CM | POA: Diagnosis not present

## 2019-01-30 DIAGNOSIS — E119 Type 2 diabetes mellitus without complications: Secondary | ICD-10-CM | POA: Diagnosis not present

## 2019-01-30 DIAGNOSIS — J309 Allergic rhinitis, unspecified: Secondary | ICD-10-CM | POA: Diagnosis not present

## 2019-01-30 DIAGNOSIS — E785 Hyperlipidemia, unspecified: Secondary | ICD-10-CM | POA: Diagnosis not present

## 2019-02-02 DIAGNOSIS — D509 Iron deficiency anemia, unspecified: Secondary | ICD-10-CM | POA: Diagnosis not present

## 2019-02-02 DIAGNOSIS — K7581 Nonalcoholic steatohepatitis (NASH): Secondary | ICD-10-CM | POA: Diagnosis not present

## 2019-02-06 DIAGNOSIS — D509 Iron deficiency anemia, unspecified: Secondary | ICD-10-CM | POA: Diagnosis not present

## 2019-02-06 DIAGNOSIS — K7581 Nonalcoholic steatohepatitis (NASH): Secondary | ICD-10-CM | POA: Diagnosis not present

## 2019-02-08 DIAGNOSIS — L570 Actinic keratosis: Secondary | ICD-10-CM | POA: Diagnosis not present

## 2019-03-05 ENCOUNTER — Encounter: Payer: Self-pay | Admitting: Cardiology

## 2019-03-05 ENCOUNTER — Telehealth (INDEPENDENT_AMBULATORY_CARE_PROVIDER_SITE_OTHER): Payer: Medicare Other | Admitting: Cardiology

## 2019-03-05 ENCOUNTER — Other Ambulatory Visit: Payer: Self-pay

## 2019-03-05 VITALS — BP 112/73 | HR 82 | Ht 61.0 in | Wt 186.0 lb

## 2019-03-05 DIAGNOSIS — E119 Type 2 diabetes mellitus without complications: Secondary | ICD-10-CM

## 2019-03-05 DIAGNOSIS — I10 Essential (primary) hypertension: Secondary | ICD-10-CM | POA: Diagnosis not present

## 2019-03-05 DIAGNOSIS — E782 Mixed hyperlipidemia: Secondary | ICD-10-CM

## 2019-03-05 NOTE — Progress Notes (Signed)
Virtual Visit via Video Note   This visit type was conducted due to national recommendations for restrictions regarding the COVID-19 Pandemic (e.g. social distancing) in an effort to limit this patient's exposure and mitigate transmission in our community.  Due to her co-morbid illnesses, this patient is at least at moderate risk for complications without adequate follow up.  This format is felt to be most appropriate for this patient at this time.  All issues noted in this document were discussed and addressed.  A limited physical exam was performed with this format.  Please refer to the patient's chart for her consent to telehealth for [facility].   Date:  03/05/2019   ID:  Vanessa Johnson, DOB November 23, 1935, MRN [id number]  Patient Location: Home Provider Location: Home  PCP:  [name], MD  Cardiologist:  No primary care provider on file.  Electrophysiologist:  None   Evaluation Performed:  Follow-Up Visit  Chief Complaint:  Chest discomfort   History of Present Illness:    Vanessa Johnson is a 83 y.o. female with past medical history of essential hypertension, dyslipidemia and diabetes mellitus.  She had chest pain and anginal symptoms.  She saw her gastroenterologist Dr. Kem Kays.  She received blood transfusion and subsequently she tells me that her hemoglobin was greater than 12 and she has had no chest pains.  She takes care of activities of daily living.  Her ambulation is limited in part because of her arthritis issues.  At the time of my evaluation, the patient is alert awake oriented and in no distress.  The patient does not have symptoms concerning for COVID-19 infection (fever, chills, cough, or new shortness of breath).    Past Medical History:  Diagnosis Date  . Allergic rhinitis   . Back pain    left lower back  . Benign essential HTN   . BMI 34.0-34.9,adult   . Bronchial asthma   . CAD (coronary artery disease)    Heart cath 01/2017 [facility]  . Diabetes  mellitus ([place])   . Gastritis    Dr. Orlena Sheldon- EGD 2018  . GERD (gastroesophageal reflux disease)   . Hyperlipidemia   . Hypothyroid   . Ischiogluteal bursitis of left side   . Pre-ulcerative corn or callous   . Sleep apnea    On CPAP  . Vitamin D deficiency    Past Surgical History:  Procedure Laterality Date  . BREAST BIOPSY  1970, 1980, and 2000  . CATARACT EXTRACTION, BILATERAL  2016  . CHOLECYSTECTOMY  2017  . DILATION AND CURETTAGE OF UTERUS  1996  . [place]  . LEFT HEART CATH AND CORONARY ANGIOGRAPHY  01/2017   [facility]- No intervention  . TONSILLECTOMY  1965  . TOTAL HIP ARTHROPLASTY Bilateral    left- 2005 and right- 2010     Current Meds  Medication Sig  . albuterol (PROVENTIL) (2.5 MG/3ML) 0.083% nebulizer solution Take 2.5 mg by nebulization every 6 (six) hours as needed for wheezing or shortness of breath.   Marland Kitchen aspirin EC 81 MG tablet Take 1 tablet (81 mg total) by mouth daily.  Marland Kitchen b complex vitamins tablet Take 1 tablet by mouth daily.  . calcium carbonate (CALCIUM 600) 600 MG TABS tablet Take 600 mg by mouth daily with breakfast.   . cyclobenzaprine (FLEXERIL) 10 MG tablet Take 10 mg by mouth 3 (three) times daily as needed.   . FEROSUL 325 (65 Fe) MG tablet Take  1 tablet by mouth daily.  Marland Kitchen FIBER SELECT GUMMIES CHEW Chew 1 tablet by mouth daily. powder  . levalbuterol (XOPENEX) 0.31 MG/3ML nebulizer solution Take 1 ampule by nebulization every 4 (four) hours as needed for wheezing or shortness of breath.   . levothyroxine (SYNTHROID, LEVOTHROID) 100 MCG tablet Take 100 mcg by mouth daily before breakfast.   . meclizine (ANTIVERT) 25 MG tablet Take 25 mg by mouth every 8 (eight) hours as needed. Every 8-12 hours as needed  . metFORMIN (GLUCOPHAGE) 500 MG tablet Take 500 mg by mouth 2 (two) times daily.   . metoprolol succinate (TOPROL-XL) 50 MG 24 hr tablet Take 50 mg by mouth daily. Take with or immediately  following a meal.  . montelukast (SINGULAIR) 10 MG tablet Take 10 mg by mouth at bedtime.   . nitroGLYCERIN (NITROSTAT) 0.4 MG SL tablet Place 1 tablet (0.4 mg total) under the tongue every 5 (five) minutes as needed. May use up to 3 tabs  . nystatin-triamcinolone (MYCOLOG II) cream Apply 1 application topically 2 (two) times daily as needed (irritation).   . pantoprazole (PROTONIX) 40 MG tablet Take 40 mg by mouth daily.  . polyethylene glycol (MIRALAX / GLYCOLAX) packet Take 17 g by mouth daily as needed.   . potassium chloride (K-DUR) 10 MEQ tablet Take 10 mEq by mouth daily.  . rosuvastatin (CRESTOR) 10 MG tablet Take 0.5 tablets (5 mg total) by mouth daily.  . Vitamin D, Ergocalciferol, (DRISDOL) 50000 units CAPS capsule Take 50,000 Units by mouth every 7 (seven) days.   . vitamin E 400 UNIT capsule Take 400 Units by mouth daily.      Allergies:   Tussionex pennkinetic er [hydrocod polst-cpm polst er]; Cefuroxime axetil; Codeine; Iohexol; Sulfamethoxazole; Cephalosporins; Hydrocodone-chlorpheniramine; and Moxifloxacin   Social History   Tobacco Use  . Smoking status: Former Research scientist (life sciences)  . Smokeless tobacco: Never Used  Substance Use Topics  . Alcohol use: No  . Drug use: No     Family Hx: The patient's family history includes Asthma in her brother and sister; Breast cancer in her sister; COPD in her brother and sister; Diabetes in her brother, mother, and sister; Emphysema in her brother, father, and sister; Heart attack in her father and mother; Heart disease in her brother and mother; Hypertension in her father, mother, and sister; Lung cancer in her brother.  ROS:   Please see the history of present illness.    As mentioned above All other systems reviewed and are negative.   Prior CV studies:   The following studies were reviewed today:  None  Labs/Other Tests and Data Reviewed:    EKG:  No ECG reviewed.  Recent Labs: 07/14/2018: ALT 27; TSH 0.094 01/05/2019: BUN 20;  Creatinine, Ser 0.77; Hemoglobin 8.0; Platelets 521; Potassium 4.5; Sodium 139   Recent Lipid Panel Lab Results  Component Value Date/Time   CHOL 183 07/14/2018 11:04 AM   TRIG 156 (H) 07/14/2018 11:04 AM   HDL 65 07/14/2018 11:04 AM   CHOLHDL 2.8 07/14/2018 11:04 AM   LDLCALC 87 07/14/2018 11:04 AM    Wt Readings from Last 3 Encounters:  03/05/19 186 lb (84.4 kg)  01/19/19 181 lb (82.1 kg)  01/05/19 180 lb 3.2 oz (81.7 kg)     Objective:    Vital Signs:  BP 112/73 (BP Location: Left Arm, Patient Position: Sitting, Cuff Size: Normal)   Pulse 82   Ht 5\' 1"  (1.549 m)   Wt 186 lb (84.4 kg)  BMI 35.14 kg/m    VITAL SIGNS:  reviewed  ASSESSMENT & PLAN:    1. Angina pectoris: After blood transfusion her symptoms have resolved completely.  She is very happy about it.  She takes care of activities of daily living without any significant problems. 2. Essential hypertension: With dietary and lifestyle modification her blood pressure is stable. 3. She has an appointment with a gastroenterologist in the month of June.  I told her to let them know that I would like to get a copy of her records and blood work.  She will be seen in follow-up appointment in 3 months or earlier if she has any concerns.  Patient had multiple questions which were answered to her satisfaction.  COVID-19 Education: The signs and symptoms of COVID-19 were discussed with the patient and how to seek care for testing (follow up with PCP or arrange E-visit).  The importance of social distancing was discussed today.  Time:   Today, I have spent 16 minutes with the patient with telehealth technology discussing the above problems.     Medication Adjustments/Labs and Tests Ordered: Current medicines are reviewed at length with the patient today.  Concerns regarding medicines are outlined above.   Tests Ordered: No orders of the defined types were placed in this encounter.   Medication Changes: No orders of the  defined types were placed in this encounter.   Disposition:  Follow up in 3 month(s)  Signed, [name], MD  03/05/2019 11:20 AM    [place]

## 2019-03-05 NOTE — Patient Instructions (Signed)

## 2019-04-04 DIAGNOSIS — E119 Type 2 diabetes mellitus without complications: Secondary | ICD-10-CM | POA: Diagnosis not present

## 2019-04-04 DIAGNOSIS — I10 Essential (primary) hypertension: Secondary | ICD-10-CM | POA: Diagnosis not present

## 2019-04-04 DIAGNOSIS — Z6833 Body mass index (BMI) 33.0-33.9, adult: Secondary | ICD-10-CM | POA: Diagnosis not present

## 2019-04-04 DIAGNOSIS — E785 Hyperlipidemia, unspecified: Secondary | ICD-10-CM | POA: Diagnosis not present

## 2019-04-11 DIAGNOSIS — K5781 Diverticulitis of intestine, part unspecified, with perforation and abscess with bleeding: Secondary | ICD-10-CM | POA: Diagnosis not present

## 2019-04-11 DIAGNOSIS — K219 Gastro-esophageal reflux disease without esophagitis: Secondary | ICD-10-CM | POA: Diagnosis not present

## 2019-04-11 DIAGNOSIS — D509 Iron deficiency anemia, unspecified: Secondary | ICD-10-CM | POA: Diagnosis not present

## 2019-04-11 DIAGNOSIS — K2950 Unspecified chronic gastritis without bleeding: Secondary | ICD-10-CM | POA: Diagnosis not present

## 2019-04-11 DIAGNOSIS — K2960 Other gastritis without bleeding: Secondary | ICD-10-CM | POA: Diagnosis not present

## 2019-04-11 DIAGNOSIS — K317 Polyp of stomach and duodenum: Secondary | ICD-10-CM | POA: Diagnosis not present

## 2019-04-11 DIAGNOSIS — D500 Iron deficiency anemia secondary to blood loss (chronic): Secondary | ICD-10-CM | POA: Diagnosis not present

## 2019-05-31 ENCOUNTER — Encounter: Payer: Self-pay | Admitting: Cardiology

## 2019-05-31 ENCOUNTER — Telehealth (INDEPENDENT_AMBULATORY_CARE_PROVIDER_SITE_OTHER): Payer: Medicare Other | Admitting: Cardiology

## 2019-05-31 VITALS — BP 106/73 | HR 88 | Temp 97.4°F | Ht 61.0 in | Wt 182.0 lb

## 2019-05-31 DIAGNOSIS — E782 Mixed hyperlipidemia: Secondary | ICD-10-CM

## 2019-05-31 DIAGNOSIS — I10 Essential (primary) hypertension: Secondary | ICD-10-CM

## 2019-05-31 DIAGNOSIS — I2510 Atherosclerotic heart disease of native coronary artery without angina pectoris: Secondary | ICD-10-CM | POA: Diagnosis not present

## 2019-05-31 DIAGNOSIS — E119 Type 2 diabetes mellitus without complications: Secondary | ICD-10-CM

## 2019-05-31 MED ORDER — ROSUVASTATIN CALCIUM 10 MG PO TABS: 10.0000 mg | ORAL_TABLET | Freq: Every day | ORAL | 1 refills | Status: DC

## 2019-05-31 MED ORDER — ROSUVASTATIN CALCIUM 10 MG PO TABS: 5.0000 mg | ORAL_TABLET | Freq: Every day | ORAL | 2 refills | Status: DC

## 2019-05-31 NOTE — Progress Notes (Signed)
Virtual Visit via Telephone Note   This visit type was conducted due to national recommendations for restrictions regarding the COVID-19 Pandemic (e.g. social distancing) in an effort to limit this patient's exposure and mitigate transmission in our community.  Due to her co-morbid illnesses, this patient is at least at moderate risk for complications without adequate follow up.  This format is felt to be most appropriate for this patient at this time.  The patient did not have access to video technology/had technical difficulties with video requiring transitioning to audio format only (telephone).  All issues noted in this document were discussed and addressed.  No physical exam could be performed with this format.  Please refer to the patient's chart for her  consent to telehealth for [facility].   Date:  05/31/2019   ID:  Vanessa Johnson, DOB 24-Feb-1936, MRN [id number]  Patient Location: Home Provider Location: Office  PCP:  [name], MD  Cardiologist:  [name], MD  Electrophysiologist:  None   Evaluation Performed:  Follow-Up Visit  Chief Complaint: Coronary artery disease  History of Present Illness:    Vanessa Johnson is a 83 y.o. female with past medical history of coronary artery disease, essential hypertension, dyslipidemia.  Patient has stable angina pectoris.  She denies any problems at this time and takes get of activities of daily living.  No chest pain orthopnea or PND.  At the time of my evaluation, the patient is alert awake oriented and in no distress.  The patient does not have symptoms concerning for COVID-19 infection (fever, chills, cough, or new shortness of breath).    Past Medical History:  Diagnosis Date  . Allergic rhinitis   . Back pain    left lower back  . Benign essential HTN   . BMI 34.0-34.9,adult   . Bronchial asthma   . CAD (coronary artery disease)    Heart cath 01/2017 [facility]  . Diabetes mellitus ([place])   . Gastritis    Dr.  Orlena Sheldon- EGD 2018  . GERD (gastroesophageal reflux disease)   . Hyperlipidemia   . Hypothyroid   . Ischiogluteal bursitis of left side   . Pre-ulcerative corn or callous   . Sleep apnea    On CPAP  . Vitamin D deficiency    Past Surgical History:  Procedure Laterality Date  . BREAST BIOPSY  1970, 1980, and 2000  . CATARACT EXTRACTION, BILATERAL  2016  . CHOLECYSTECTOMY  2017  . DILATION AND CURETTAGE OF UTERUS  1996  . [place]  . LEFT HEART CATH AND CORONARY ANGIOGRAPHY  01/2017   [facility]- No intervention  . TONSILLECTOMY  1965  . TOTAL HIP ARTHROPLASTY Bilateral    left- 2005 and right- 2010     Current Meds  Medication Sig  . albuterol (PROVENTIL) (2.5 MG/3ML) 0.083% nebulizer solution Take 2.5 mg by nebulization every 6 (six) hours as needed for wheezing or shortness of breath.   Marland Kitchen aspirin EC 81 MG tablet Take 1 tablet (81 mg total) by mouth daily.  Marland Kitchen b complex vitamins tablet Take 1 tablet by mouth daily.  . calcium carbonate (CALCIUM 600) 600 MG TABS tablet Take 600 mg by mouth daily with breakfast.   . cyclobenzaprine (FLEXERIL) 10 MG tablet Take 10 mg by mouth 3 (three) times daily as needed.   . FEROSUL 325 (65 Fe) MG tablet Take 1 tablet by mouth daily.  Marland Kitchen [place] 1  tablet by mouth daily. powder  . levalbuterol (XOPENEX) 0.31 MG/3ML nebulizer solution Take 1 ampule by nebulization every 4 (four) hours as needed for wheezing or shortness of breath.   . levothyroxine (SYNTHROID, LEVOTHROID) 100 MCG tablet Take 100 mcg by mouth daily before breakfast.   . meclizine (ANTIVERT) 25 MG tablet Take 25 mg by mouth every 8 (eight) hours as needed. Every 8-12 hours as needed  . metFORMIN (GLUCOPHAGE) 500 MG tablet Take 500 mg by mouth 2 (two) times daily.   . metoprolol succinate (TOPROL-XL) 50 MG 24 hr tablet Take 50 mg by mouth daily. Take with or immediately following a meal.  . montelukast  (SINGULAIR) 10 MG tablet Take 10 mg by mouth at bedtime.   . nitroGLYCERIN (NITROSTAT) 0.4 MG SL tablet Place 1 tablet (0.4 mg total) under the tongue every 5 (five) minutes as needed. May use up to 3 tabs  . nystatin-triamcinolone (MYCOLOG II) cream Apply 1 application topically 2 (two) times daily as needed (irritation).   . Omega-3 Fatty Acids (FISH OIL OMEGA-3) 1000 MG CAPS Take 1,200 mg by mouth daily. Take 2 Tablets Daily  . pantoprazole (PROTONIX) 40 MG tablet Take 40 mg by mouth daily.  . polyethylene glycol (MIRALAX / GLYCOLAX) packet Take 17 g by mouth daily as needed.   . potassium chloride (K-DUR) 10 MEQ tablet Take 10 mEq by mouth daily.  . rosuvastatin (CRESTOR) 10 MG tablet Take 0.5 tablets (5 mg total) by mouth daily.  . Vitamin D, Ergocalciferol, (DRISDOL) 50000 units CAPS capsule Take 50,000 Units by mouth every 7 (seven) days.   . vitamin E 400 UNIT capsule Take 400 Units by mouth daily.      Allergies:   Tussionex pennkinetic er [hydrocod polst-cpm polst er], Cefuroxime axetil, Cephalexin, Codeine, Iohexol, Sulfamethoxazole, Cephalosporins, Hydrocodone-chlorpheniramine, and Moxifloxacin   Social History   Tobacco Use  . Smoking status: Former Research scientist (life sciences)  . Smokeless tobacco: Never Used  Substance Use Topics  . Alcohol use: No  . Drug use: No     Family Hx: The patient's family history includes Asthma in her brother and sister; Breast cancer in her sister; COPD in her brother and sister; Diabetes in her brother, mother, and sister; Emphysema in her brother, father, and sister; Heart attack in her father and mother; Heart disease in her brother and mother; Hypertension in her father, mother, and sister; Lung cancer in her brother.  ROS:   Please see the history of present illness.    As mentioned above All other systems reviewed and are negative.   Prior CV studies:   The following studies were reviewed today:  Lab work from the past was reviewed  Labs/Other Tests  and Data Reviewed:    EKG:  No ECG reviewed.  Recent Labs: 07/14/2018: ALT 27; TSH 0.094 01/05/2019: BUN 20; Creatinine, Ser 0.77; Hemoglobin 8.0; Platelets 521; Potassium 4.5; Sodium 139   Recent Lipid Panel Lab Results  Component Value Date/Time   CHOL 183 07/14/2018 11:04 AM   TRIG 156 (H) 07/14/2018 11:04 AM   HDL 65 07/14/2018 11:04 AM   CHOLHDL 2.8 07/14/2018 11:04 AM   LDLCALC 87 07/14/2018 11:04 AM    Wt Readings from Last 3 Encounters:  05/31/19 182 lb (82.6 kg)  03/05/19 186 lb (84.4 kg)  01/19/19 181 lb (82.1 kg)     Objective:    Vital Signs:  BP 106/73 (BP Location: Left Arm, Patient Position: Sitting)   Pulse 88   Temp (!) 97.4  F (36.3 C) (Oral)   Ht 5\' 1"  (1.549 m)   Wt 182 lb (82.6 kg)   BMI 34.39 kg/m    VITAL SIGNS:  reviewed  ASSESSMENT & PLAN:    1. Coronary artery disease: Secondary prevention stressed with the patient.  Importance of compliance with diet and medication stressed and she vocalized understanding.  Her blood pressure is stable.  She will be back in the next few days for blood work Chem-7 CBC TSH liver lipid check and vitamin D level. 2. Essential hypertension: Blood pressure stable 3. Mixed dyslipidemia: Diet was discussed she will have blood work in the next few days. 4. Patient will be seen in follow-up appointment in 6 months or earlier if the patient has any concerns   COVID-19 Education: The signs and symptoms of COVID-19 were discussed with the patient and how to seek care for testing (follow up with PCP or arrange E-visit).  The importance of social distancing was discussed today.  Time:   Today, I have spent 12 minutes with the patient with telehealth technology discussing the above problems.     Medication Adjustments/Labs and Tests Ordered: Current medicines are reviewed at length with the patient today.  Concerns regarding medicines are outlined above.   Tests Ordered: No orders of the defined types were placed in  this encounter.   Medication Changes: No orders of the defined types were placed in this encounter.   Follow Up:  Virtual Visit or In Person in 4 month(s)  Signed, [name], MD  05/31/2019 11:47 AM    [place]

## 2019-05-31 NOTE — Addendum Note (Signed)
Addended by: Polly Cobia A on: 05/31/2019 12:26 PM   Modules accepted: Orders

## 2019-05-31 NOTE — Patient Instructions (Signed)
Medication Instructions: Your physician recommends that you continue on your current medications as directed. Please refer to the Current Medication list given to you today.  If you need a refill on your cardiac medications before your next appointment, please call your pharmacy.   Lab work: CBC CHEM 7 TSH HEPATIC PANEL LIPIDS  If you have labs (blood work) drawn today and your tests are completely normal, you will receive your results only by: Marland Kitchen MyChart Message (if you have MyChart) OR . A paper copy in the mail If you have any lab test that is abnormal or we need to change your treatment, we will call you to review the results.  Testing/Procedures: NONE  Follow-Up: At [facility], you and your health needs are our priority.  As part of our continuing mission to provide you with exceptional heart care, we have created designated Provider Care Teams.  These Care Teams include your primary Cardiologist (physician) and Advanced Practice Providers (APPs -  Physician Assistants and Nurse Practitioners) who all work together to provide you with the care you need, when you need it. . You will need a follow up appointment in 4 months.  Please call our office 2 months in advance to schedule this appointment.   Any Other Special Instructions Will Be Listed Below (If Applicable). NONE

## 2019-06-11 DIAGNOSIS — E782 Mixed hyperlipidemia: Secondary | ICD-10-CM | POA: Diagnosis not present

## 2019-06-11 DIAGNOSIS — E119 Type 2 diabetes mellitus without complications: Secondary | ICD-10-CM | POA: Diagnosis not present

## 2019-06-11 DIAGNOSIS — I2510 Atherosclerotic heart disease of native coronary artery without angina pectoris: Secondary | ICD-10-CM | POA: Diagnosis not present

## 2019-06-11 DIAGNOSIS — I10 Essential (primary) hypertension: Secondary | ICD-10-CM | POA: Diagnosis not present

## 2019-06-12 ENCOUNTER — Telehealth: Payer: Self-pay | Admitting: *Deleted

## 2019-06-12 DIAGNOSIS — G4733 Obstructive sleep apnea (adult) (pediatric): Secondary | ICD-10-CM | POA: Diagnosis not present

## 2019-06-12 DIAGNOSIS — J310 Chronic rhinitis: Secondary | ICD-10-CM | POA: Diagnosis not present

## 2019-06-12 DIAGNOSIS — R05 Cough: Secondary | ICD-10-CM | POA: Diagnosis not present

## 2019-06-12 DIAGNOSIS — J4520 Mild intermittent asthma, uncomplicated: Secondary | ICD-10-CM | POA: Diagnosis not present

## 2019-06-12 LAB — CBC
Hematocrit: 38.3 % (ref 34.0–46.6)
Hemoglobin: 12.7 g/dL (ref 11.1–15.9)
MCH: 27.5 pg (ref 26.6–33.0)
MCHC: 33.2 g/dL (ref 31.5–35.7)
MCV: 83 fL (ref 79–97)
Platelets: 336 10*3/uL (ref 150–450)
RBC: 4.61 x10E6/uL (ref 3.77–5.28)
RDW: 13.2 % (ref 11.7–15.4)
WBC: 6.9 10*3/uL (ref 3.4–10.8)

## 2019-06-12 LAB — LIPID PANEL
Chol/HDL Ratio: 3.2 ratio (ref 0.0–4.4)
Cholesterol, Total: 136 mg/dL (ref 100–199)
HDL: 43 mg/dL (ref >39)
LDL Calculated: 58 mg/dL (ref 0–99)
Triglycerides: 175 mg/dL — ABNORMAL HIGH (ref 0–149)
VLDL Cholesterol Cal: 35 mg/dL (ref 5–40)

## 2019-06-12 LAB — HEPATIC FUNCTION PANEL
ALT: 32 IU/L (ref 0–32)
AST: 29 IU/L (ref 0–40)
Albumin: 4.7 g/dL — ABNORMAL HIGH (ref 3.6–4.6)
Alkaline Phosphatase: 64 IU/L (ref 39–117)
Bilirubin Total: 0.4 mg/dL (ref 0.0–1.2)
Bilirubin, Direct: 0.14 mg/dL (ref 0.00–0.40)
Total Protein: 6.7 g/dL (ref 6.0–8.5)

## 2019-06-12 LAB — BASIC METABOLIC PANEL
BUN/Creatinine Ratio: 17 (ref 12–28)
BUN: 12 mg/dL (ref 8–27)
CO2: 23 mmol/L (ref 20–29)
Calcium: 9.6 mg/dL (ref 8.7–10.3)
Chloride: 100 mmol/L (ref 96–106)
Creatinine, Ser: 0.7 mg/dL (ref 0.57–1.00)
GFR calc Af Amer: 94 mL/min/{1.73_m2} (ref >59)
GFR calc non Af Amer: 82 mL/min/{1.73_m2} (ref >59)
Glucose: 113 mg/dL — ABNORMAL HIGH (ref 65–99)
Potassium: 4.1 mmol/L (ref 3.5–5.2)
Sodium: 140 mmol/L (ref 134–144)

## 2019-06-12 LAB — TSH: TSH: 1.43 u[IU]/mL (ref 0.450–4.500)

## 2019-06-12 NOTE — Telephone Encounter (Signed)
-----   Message from [name], MD sent at 06/12/2019  9:31 AM EDT ----- The results of the study is unremarkable. Please inform patient. I will discuss in detail at next appointment. Cc  primary care/referring physician [name], MD 06/12/2019 9:31 AM

## 2019-06-12 NOTE — Telephone Encounter (Signed)
Telephone call to patient. Left message that labs were unremarkable and to call with any questions. Copy sent to PCP 

## 2019-07-04 DIAGNOSIS — Z780 Asymptomatic menopausal state: Secondary | ICD-10-CM | POA: Diagnosis not present

## 2019-07-04 DIAGNOSIS — I10 Essential (primary) hypertension: Secondary | ICD-10-CM | POA: Diagnosis not present

## 2019-07-04 DIAGNOSIS — E119 Type 2 diabetes mellitus without complications: Secondary | ICD-10-CM | POA: Diagnosis not present

## 2019-07-04 DIAGNOSIS — E785 Hyperlipidemia, unspecified: Secondary | ICD-10-CM | POA: Diagnosis not present

## 2019-07-13 DIAGNOSIS — J019 Acute sinusitis, unspecified: Secondary | ICD-10-CM | POA: Diagnosis not present

## 2019-07-13 DIAGNOSIS — N390 Urinary tract infection, site not specified: Secondary | ICD-10-CM | POA: Diagnosis not present

## 2019-07-13 DIAGNOSIS — J45909 Unspecified asthma, uncomplicated: Secondary | ICD-10-CM | POA: Diagnosis not present

## 2019-07-16 DIAGNOSIS — D509 Iron deficiency anemia, unspecified: Secondary | ICD-10-CM | POA: Diagnosis not present

## 2019-07-17 DIAGNOSIS — M85832 Other specified disorders of bone density and structure, left forearm: Secondary | ICD-10-CM | POA: Diagnosis not present

## 2019-07-17 DIAGNOSIS — Z780 Asymptomatic menopausal state: Secondary | ICD-10-CM | POA: Diagnosis not present

## 2019-07-23 DIAGNOSIS — D509 Iron deficiency anemia, unspecified: Secondary | ICD-10-CM | POA: Diagnosis not present

## 2019-07-25 DIAGNOSIS — N390 Urinary tract infection, site not specified: Secondary | ICD-10-CM | POA: Diagnosis not present

## 2019-07-26 DIAGNOSIS — K7581 Nonalcoholic steatohepatitis (NASH): Secondary | ICD-10-CM | POA: Diagnosis not present

## 2019-07-26 DIAGNOSIS — D500 Iron deficiency anemia secondary to blood loss (chronic): Secondary | ICD-10-CM | POA: Diagnosis not present

## 2019-08-08 DIAGNOSIS — E559 Vitamin D deficiency, unspecified: Secondary | ICD-10-CM | POA: Diagnosis not present

## 2019-08-08 DIAGNOSIS — E119 Type 2 diabetes mellitus without complications: Secondary | ICD-10-CM | POA: Diagnosis not present

## 2019-08-08 DIAGNOSIS — Z23 Encounter for immunization: Secondary | ICD-10-CM | POA: Diagnosis not present

## 2019-08-08 DIAGNOSIS — J45909 Unspecified asthma, uncomplicated: Secondary | ICD-10-CM | POA: Diagnosis not present

## 2019-08-08 DIAGNOSIS — E785 Hyperlipidemia, unspecified: Secondary | ICD-10-CM | POA: Diagnosis not present

## 2019-08-08 DIAGNOSIS — Z79899 Other long term (current) drug therapy: Secondary | ICD-10-CM | POA: Diagnosis not present

## 2019-08-08 DIAGNOSIS — I10 Essential (primary) hypertension: Secondary | ICD-10-CM | POA: Diagnosis not present

## 2019-08-08 DIAGNOSIS — Z6833 Body mass index (BMI) 33.0-33.9, adult: Secondary | ICD-10-CM | POA: Diagnosis not present

## 2019-08-14 DIAGNOSIS — H35371 Puckering of macula, right eye: Secondary | ICD-10-CM | POA: Diagnosis not present

## 2019-08-14 DIAGNOSIS — H35341 Macular cyst, hole, or pseudohole, right eye: Secondary | ICD-10-CM | POA: Diagnosis not present

## 2019-08-14 DIAGNOSIS — Z961 Presence of intraocular lens: Secondary | ICD-10-CM | POA: Diagnosis not present

## 2019-08-14 DIAGNOSIS — H04123 Dry eye syndrome of bilateral lacrimal glands: Secondary | ICD-10-CM | POA: Diagnosis not present

## 2019-08-14 DIAGNOSIS — E119 Type 2 diabetes mellitus without complications: Secondary | ICD-10-CM | POA: Diagnosis not present

## 2019-08-28 DIAGNOSIS — L578 Other skin changes due to chronic exposure to nonionizing radiation: Secondary | ICD-10-CM | POA: Diagnosis not present

## 2019-08-28 DIAGNOSIS — L821 Other seborrheic keratosis: Secondary | ICD-10-CM | POA: Diagnosis not present

## 2019-08-28 DIAGNOSIS — L570 Actinic keratosis: Secondary | ICD-10-CM | POA: Diagnosis not present

## 2019-09-17 DIAGNOSIS — J4520 Mild intermittent asthma, uncomplicated: Secondary | ICD-10-CM | POA: Diagnosis not present

## 2019-09-17 DIAGNOSIS — H44001 Unspecified purulent endophthalmitis, right eye: Secondary | ICD-10-CM | POA: Diagnosis not present

## 2019-09-17 DIAGNOSIS — G4733 Obstructive sleep apnea (adult) (pediatric): Secondary | ICD-10-CM | POA: Diagnosis not present

## 2019-09-17 DIAGNOSIS — J310 Chronic rhinitis: Secondary | ICD-10-CM | POA: Diagnosis not present

## 2019-09-17 DIAGNOSIS — R5383 Other fatigue: Secondary | ICD-10-CM | POA: Diagnosis not present

## 2019-10-03 DIAGNOSIS — H44001 Unspecified purulent endophthalmitis, right eye: Secondary | ICD-10-CM | POA: Diagnosis not present

## 2019-10-03 DIAGNOSIS — H5789 Other specified disorders of eye and adnexa: Secondary | ICD-10-CM | POA: Diagnosis not present

## 2019-10-03 DIAGNOSIS — R413 Other amnesia: Secondary | ICD-10-CM | POA: Diagnosis not present

## 2019-10-18 DIAGNOSIS — R0789 Other chest pain: Secondary | ICD-10-CM | POA: Diagnosis not present

## 2019-10-19 DIAGNOSIS — I447 Left bundle-branch block, unspecified: Secondary | ICD-10-CM | POA: Diagnosis not present

## 2019-10-22 DIAGNOSIS — I5020 Unspecified systolic (congestive) heart failure: Secondary | ICD-10-CM | POA: Diagnosis not present

## 2019-10-22 DIAGNOSIS — I2510 Atherosclerotic heart disease of native coronary artery without angina pectoris: Secondary | ICD-10-CM | POA: Diagnosis not present

## 2019-10-22 DIAGNOSIS — I110 Hypertensive heart disease with heart failure: Secondary | ICD-10-CM | POA: Diagnosis not present

## 2019-11-01 ENCOUNTER — Ambulatory Visit (INDEPENDENT_AMBULATORY_CARE_PROVIDER_SITE_OTHER): Payer: Medicare Other | Admitting: Cardiology

## 2019-11-01 ENCOUNTER — Encounter: Payer: Self-pay | Admitting: Cardiology

## 2019-11-01 ENCOUNTER — Other Ambulatory Visit: Payer: Self-pay

## 2019-11-01 VITALS — BP 80/50 | HR 58 | Ht 61.0 in | Wt 162.4 lb

## 2019-11-01 DIAGNOSIS — E782 Mixed hyperlipidemia: Secondary | ICD-10-CM

## 2019-11-01 DIAGNOSIS — E119 Type 2 diabetes mellitus without complications: Secondary | ICD-10-CM

## 2019-11-01 DIAGNOSIS — I2510 Atherosclerotic heart disease of native coronary artery without angina pectoris: Secondary | ICD-10-CM

## 2019-11-01 DIAGNOSIS — I10 Essential (primary) hypertension: Secondary | ICD-10-CM | POA: Diagnosis not present

## 2019-11-01 DIAGNOSIS — Z1329 Encounter for screening for other suspected endocrine disorder: Secondary | ICD-10-CM | POA: Diagnosis not present

## 2019-11-01 DIAGNOSIS — G4733 Obstructive sleep apnea (adult) (pediatric): Secondary | ICD-10-CM

## 2019-11-01 NOTE — Progress Notes (Signed)
Cardiology Office Note:    Date:  11/01/2019   ID:  Vanessa Johnson, DOB 01-16-1936, MRN [id number]  PCP:  [name], MD  Cardiologist:  [name], MD   Referring MD: [name], MD    ASSESSMENT:    1. Coronary artery disease involving native coronary artery of native heart without angina pectoris   2. Essential hypertension   3. Mixed dyslipidemia   4. Diabetes mellitus without complication ([place])   5. Obstructive sleep apnea of adult    PLAN:    In order of problems listed above:  1. Coronary artery disease: Nonobstructive in nature.  Records were reviewed.  Cardiomyopathy also is present.  This is also appearing nonischemic at this time.  Patient is on appropriate medications.  Her blood pressure is borderline.  Her blood pressure is 80/50 today.  She is asymptomatic.  I will continue current medications.  However I do not see any role for isosorbide at this time.  I have told her to stop it in view of borderline blood pressure.  She has furosemide 20 mg tablets she will use this only on a as needed basis she has no history of orthopnea or PND or pedal edema.  If she has any significant issues she was there was a call if not she will be seen in follow-up appointment in a month or earlier if she has any concerns.  She will have a Chem-7 today.  Cardiomyopathy issues were discussed and she vocalized understanding.  I discussed with her primary prevention of sudden cardiac death and LifeVest but she is not keen on it at this time.  Risks including sudden cardiac death were discussed and she understood.  I will await to get a follow-up echocardiogram when I see her in follow-up appointment in a month or so and revisit this issue. 2. Mixed dyslipidemia: Diet was discussed and weight reduction was stressed.  Lipids will be rechecked when she comes for follow-up in the next visit.   Medication Adjustments/Labs and Tests Ordered: Current medicines are reviewed at length with the patient  today.  Concerns regarding medicines are outlined above.  No orders of the defined types were placed in this encounter.  No orders of the defined types were placed in this encounter.    Chief Complaint  Patient presents with  . Follow-up     History of Present Illness:    Vanessa Johnson is a 84 y.o. female.  Patient was transferred to [facility] where she underwent coronary angiography.  This revealed nonobstructive disease and cardiomyopathy with ejection fraction of about 30%.  Subsequently the patient is doing fine.  No chest pain orthopnea or PND.  Her only issue is some dyspnea on exertion.  At the time of my evaluation, the patient is alert awake oriented and in no distress.  I reviewed [facility] records extensively and records are documented below.  Past Medical History:  Diagnosis Date  . Allergic rhinitis   . Back pain    left lower back  . Benign essential HTN   . BMI 34.0-34.9,adult   . Bronchial asthma   . CAD (coronary artery disease)    Heart cath 01/2017 [facility]  . Diabetes mellitus ([place])   . Gastritis    Dr. Orlena Sheldon- EGD 2018  . GERD (gastroesophageal reflux disease)   . Hyperlipidemia   . Hypothyroid   . Ischiogluteal bursitis of left side   . Pre-ulcerative corn or callous   .  Sleep apnea    On CPAP  . Vitamin D deficiency     Past Surgical History:  Procedure Laterality Date  . BREAST BIOPSY  1970, 1980, and 2000  . CATARACT EXTRACTION, BILATERAL  2016  . CHOLECYSTECTOMY  2017  . DILATION AND CURETTAGE OF UTERUS  1996  . [place]  . LEFT HEART CATH AND CORONARY ANGIOGRAPHY  01/2017   [facility]- No intervention  . TONSILLECTOMY  1965  . TOTAL HIP ARTHROPLASTY Bilateral    left- 2005 and right- 2010    Current Medications: Current Meds  Medication Sig  . albuterol (PROVENTIL) (2.5 MG/3ML) 0.083% nebulizer solution Take 2.5 mg by nebulization every 6  (six) hours as needed for wheezing or shortness of breath.   Marland Kitchen aspirin EC 81 MG tablet Take 1 tablet (81 mg total) by mouth daily.  Marland Kitchen b complex vitamins tablet Take 1 tablet by mouth daily.  . calcium carbonate (CALCIUM 600) 600 MG TABS tablet Take 600 mg by mouth daily with breakfast.   . cyclobenzaprine (FLEXERIL) 10 MG tablet Take 10 mg by mouth 3 (three) times daily as needed.   Mariane Baumgarten Sodium (DSS) 100 MG CAPS Take by mouth.  . FEROSUL 325 (65 Fe) MG tablet Take 1 tablet by mouth daily.  Marland Kitchen FIBER SELECT GUMMIES CHEW Chew 1 tablet by mouth daily. powder  . fluticasone (FLONASE) 50 MCG/ACT nasal spray USE 2 SPRAYS IN EACH NOSTRIL DAILY  . Fluticasone-Salmeterol (ADVAIR) 250-50 MCG/DOSE AEPB Inhale into the lungs.  . furosemide (LASIX) 20 MG tablet Take by mouth.  . isosorbide mononitrate (IMDUR) 30 MG 24 hr tablet Take by mouth.  . levalbuterol (XOPENEX) 0.31 MG/3ML nebulizer solution Take 1 ampule by nebulization every 4 (four) hours as needed for wheezing or shortness of breath.   . levothyroxine (SYNTHROID, LEVOTHROID) 100 MCG tablet Take 100 mcg by mouth daily before breakfast.   . meclizine (ANTIVERT) 25 MG tablet Take 25 mg by mouth every 8 (eight) hours as needed. Every 8-12 hours as needed  . metFORMIN (GLUCOPHAGE) 500 MG tablet Take 500 mg by mouth 2 (two) times daily.   . metoprolol succinate (TOPROL-XL) 100 MG 24 hr tablet Take 100 mg by mouth daily. Take with or immediately following a meal.   . montelukast (SINGULAIR) 10 MG tablet Take 10 mg by mouth at bedtime.   . nitroGLYCERIN (NITROSTAT) 0.4 MG SL tablet Place under the tongue.  . nystatin-triamcinolone (MYCOLOG II) cream Apply 1 application topically 2 (two) times daily as needed (irritation).   . pantoprazole (PROTONIX) 40 MG tablet Take 40 mg by mouth daily.  . polyethylene glycol (MIRALAX / GLYCOLAX) packet Take 17 g by mouth daily as needed.   . potassium chloride (K-DUR) 10 MEQ tablet Take 10 mEq by mouth daily.  .  Vitamin D, Ergocalciferol, (DRISDOL) 50000 units CAPS capsule Take 50,000 Units by mouth every 7 (seven) days.   . vitamin E 400 UNIT capsule Take 400 Units by mouth daily.      Allergies:   Tussionex pennkinetic er [hydrocod polst-cpm polst er], Cefuroxime axetil, Cephalexin, Codeine, Iohexol, Sulfamethoxazole, Cephalosporins, Hydrocodone-chlorpheniramine, and Moxifloxacin   Social History   Socioeconomic History  . Marital status: Widowed    Spouse name: Not on file  . Number of children: Not on file  . Years of education: Not on file  . Highest education level: Not on file  Occupational History  . Not on file  Tobacco Use  .  Smoking status: Former Research scientist (life sciences)  . Smokeless tobacco: Never Used  Substance and Sexual Activity  . Alcohol use: No  . Drug use: No  . Sexual activity: Not on file  Other Topics Concern  . Not on file  Social History Narrative  . Not on file   Social Determinants of Health   Financial Resource Strain:   . Difficulty of Paying Living Expenses: Not on file  Food Insecurity:   . Worried About Charity fundraiser in the Last Year: Not on file  . Ran Out of Food in the Last Year: Not on file  Transportation Needs:   . Lack of Transportation (Medical): Not on file  . Lack of Transportation (Non-Medical): Not on file  Physical Activity:   . Days of Exercise per Week: Not on file  . Minutes of Exercise per Session: Not on file  Stress:   . Feeling of Stress : Not on file  Social Connections:   . Frequency of Communication with Friends and Family: Not on file  . Frequency of Social Gatherings with Friends and Family: Not on file  . Attends Religious Services: Not on file  . Active Member of Clubs or Organizations: Not on file  . Attends Archivist Meetings: Not on file  . Marital Status: Not on file     Family History: The patient's family history includes Asthma in her brother and sister; Breast cancer in her sister; COPD in her brother and  sister; Diabetes in her brother, mother, and sister; Emphysema in her brother, father, and sister; Heart attack in her father and mother; Heart disease in her brother and mother; Hypertension in her father, mother, and sister; Lung cancer in her brother.  ROS:   Please see the history of present illness.    All other systems reviewed and are negative.  EKGs/Labs/Other Studies Reviewed:    The following studies were reviewed today: CUS TTE SURFACE COMPLETE ECHO ADULT12/26/2020 [facility] Result Narrative  Transthoracic Echocardiography Report (TTE)  Demographics  Patient Name   KYLYNN ONUFER Gender        Female  Patient Number  [id number]      Race         Caucasian  Visit Number   [id number]    Room Number     [facility]  Accession Number [id number]     Date of Study    10/20/2019  Date of Birth   05-Jul-1936    Referring Physician Siddharth Wilmon Pali, MD  Age        45 year(s)    Sonographer     Rashidi Janell Quiet,                              RCS                    Interpreting     Bishop Limbo, DO,                    Physician      [facility] Procedure Type of Study  TTE procedure: CUS TTE SURFACE COMPLETE ECHO ADULT. Procedure date Date: 10/20/2019 Start: 12:06 PM Technical Quality: Adequate visualization Study Location: Portable Indications: Chest pain. Patient Status:  Routine Height: 61.81 inches Weight: 167.55 pounds BSA: 1.77 m2 BMI: 30.83 kg/m2 BP: 114/55 mmHg Conclusions Summary Severely reduced LV systolic function. Ejection fraction is visually estimated at 30-35% Moderate global LV hypokinesis with LV septal dysynchrony. Abnormal LV diastolic function. Normal right ventricular size and function. Mildly dilated left atrium. Mild-to-moderate mitral  regurgitation. Est. PA systolic function 35 mmHg. In comparison to the echo from [id number], LV systolic function has declined. Signature      Signatures  Electronically signed by Alinda Money, MD, FACC(Diagnostic  Physician) on 10/29/2019 15:21  Angiographic findings  Cardiac Arteries and Lesion Findings LMCA: Abnormal and Focal stenosis.   Lesion on LMCA: Ostial.40% stenosis 3 mm length . Pre procedure TIMI III   flow was noted. Good run off was present.The lesion was eccentric and   lightly calcified.   Comments:IVUS of ostial LM: somewhat skewed images however diagnostic.   moderate eccentric calcific plaque, luminal area >47mm2 LAD: Normal. LCx: Diffuse irregularity. RCA: Single stenosis.   Lesion on Prox RCA: Mid subsection.50% stenosis. Pre procedure TIMI III   flow was noted. Good run off was present. The lesion was diagnosed as Low   Risk (A). The lesion was tubular and lightly calcified.The lesion showed   evidence of not present thrombus and with irregular contour.  Recent Labs: 06/11/2019: ALT 32; BUN 12; Creatinine, Ser 0.70; Hemoglobin 12.7; Platelets 336; Potassium 4.1; Sodium 140; TSH 1.430  Recent Lipid Panel    Component Value Date/Time   CHOL 136 06/11/2019 1035   TRIG 175 (H) 06/11/2019 1035   HDL 43 06/11/2019 1035   CHOLHDL 3.2 06/11/2019 1035   LDLCALC 58 06/11/2019 1035    Physical Exam:    VS:  BP (!) 80/50   Pulse (!) 58   Ht 5\' 1"  (1.549 m)   Wt 162 lb 6.4 oz (73.7 kg)   SpO2 96%   BMI 30.69 kg/m     Wt Readings from Last 3 Encounters:  11/01/19 162 lb 6.4 oz (73.7 kg)  05/31/19 182 lb (82.6 kg)  03/05/19 186 lb (84.4 kg)     GEN: Patient is in no acute distress HEENT: Normal NECK: No JVD; No carotid bruits LYMPHATICS: No lymphadenopathy CARDIAC: Hear sounds regular, 2/6 systolic murmur at the apex. RESPIRATORY:  Clear to auscultation without rales, wheezing or rhonchi  ABDOMEN: Soft, non-tender, non-distended MUSCULOSKELETAL:  No  edema; No deformity  SKIN: Warm and dry NEUROLOGIC:  Alert and oriented x 3 PSYCHIATRIC:  Normal affect   Signed, [name], MD  11/01/2019 3:25 PM    [facility]

## 2019-11-01 NOTE — Patient Instructions (Signed)
Medication Instructions:  Your physician has recommended you make the following change in your medication:   STOP taking lasix and isosorbide  *If you need a refill on your cardiac medications before your next appointment, please call your pharmacy*  Lab Work: Your physician recommends that you have a BMP and TSH drawn today  If you have labs (blood work) drawn today and your tests are completely normal, you will receive your results only by: Marland Kitchen MyChart Message (if you have MyChart) OR . A paper copy in the mail If you have any lab test that is abnormal or we need to change your treatment, we will call you to review the results.  Testing/Procedures: NONE  Follow-Up: At [facility], you and your health needs are our priority.  As part of our continuing mission to provide you with exceptional heart care, we have created designated Provider Care Teams.  These Care Teams include your primary Cardiologist (physician) and Advanced Practice Providers (APPs -  Physician Assistants and Nurse Practitioners) who all work together to provide you with the care you need, when you need it.  Your next appointment:   1 month(s)  The format for your next appointment:   In Person  Provider:   Jyl Heinz, MD

## 2019-11-01 NOTE — Addendum Note (Signed)
Addended by: Beckey Rutter on: 11/01/2019 03:37 PM   Modules accepted: Orders

## 2019-11-02 LAB — BASIC METABOLIC PANEL
BUN/Creatinine Ratio: 17 (ref 12–28)
BUN: 22 mg/dL (ref 8–27)
CO2: 23 mmol/L (ref 20–29)
Calcium: 10 mg/dL (ref 8.7–10.3)
Chloride: 92 mmol/L — ABNORMAL LOW (ref 96–106)
Creatinine, Ser: 1.31 mg/dL — ABNORMAL HIGH (ref 0.57–1.00)
GFR calc Af Amer: 44 mL/min/{1.73_m2} — ABNORMAL LOW (ref >59)
GFR calc non Af Amer: 38 mL/min/{1.73_m2} — ABNORMAL LOW (ref >59)
Glucose: 119 mg/dL — ABNORMAL HIGH (ref 65–99)
Potassium: 3.4 mmol/L — ABNORMAL LOW (ref 3.5–5.2)
Sodium: 134 mmol/L (ref 134–144)

## 2019-11-02 LAB — TSH: TSH: 0.718 u[IU]/mL (ref 0.450–4.500)

## 2019-11-07 ENCOUNTER — Encounter: Payer: Self-pay | Admitting: *Deleted

## 2019-11-07 ENCOUNTER — Telehealth: Payer: Self-pay | Admitting: *Deleted

## 2019-11-07 DIAGNOSIS — I10 Essential (primary) hypertension: Secondary | ICD-10-CM

## 2019-11-07 MED ORDER — FUROSEMIDE 20 MG PO TABS: 20.0000 mg | ORAL_TABLET | Freq: Two times a day (BID) | ORAL | 1 refills | Status: DC

## 2019-11-07 NOTE — Telephone Encounter (Signed)
Telephone call back to patient. Informed her to restart furosemide as before which was 20 mg twice daily. Called into [facility] on [address] as requested by patient.

## 2019-11-07 NOTE — Telephone Encounter (Signed)
Yes go back as before and recheck bmp in one week.

## 2019-11-07 NOTE — Telephone Encounter (Signed)
-----   Message from [name], MD sent at 11/02/2019  1:19 PM EST ----- Take very litte extra K ind diet every other day. Recheck in 10 days. Cc pcp [name], MD 11/02/2019 1:18 PM

## 2019-11-07 NOTE — Addendum Note (Signed)
Addended by: Particia Nearing B on: 11/07/2019 01:37 PM   Modules accepted: Orders

## 2019-11-07 NOTE — Telephone Encounter (Signed)
Telephone call to patient. Informed of lab results and need to increase potassium in diet every other day. Also informed to repeat BMP in 10 days.   During the call she states she has gained 5 lbs in 24 hours and wants to know if she needs to restart her furosemide. Please advise.

## 2019-11-12 ENCOUNTER — Telehealth: Payer: Self-pay | Admitting: Cardiology

## 2019-11-12 NOTE — Telephone Encounter (Signed)
Pt calling stating that she would like Dr. Julien Nordmann nurse to give her a call back. Please address

## 2019-11-13 MED ORDER — NITROGLYCERIN 0.4 MG SL SUBL: 0.4000 mg | SUBLINGUAL_TABLET | SUBLINGUAL | 2 refills | Status: DC | PRN

## 2019-11-13 NOTE — Addendum Note (Signed)
Addended by: Beckey Rutter on: 11/13/2019 11:23 AM   Modules accepted: Orders

## 2019-11-28 DIAGNOSIS — E785 Hyperlipidemia, unspecified: Secondary | ICD-10-CM | POA: Diagnosis not present

## 2019-11-28 DIAGNOSIS — Z139 Encounter for screening, unspecified: Secondary | ICD-10-CM | POA: Diagnosis not present

## 2019-11-28 DIAGNOSIS — Z0000 Encounter for general adult medical examination without abnormal findings: Secondary | ICD-10-CM | POA: Diagnosis not present

## 2019-11-28 DIAGNOSIS — Z1331 Encounter for screening for depression: Secondary | ICD-10-CM | POA: Diagnosis not present

## 2019-11-28 DIAGNOSIS — Z9181 History of falling: Secondary | ICD-10-CM | POA: Diagnosis not present

## 2019-12-03 ENCOUNTER — Other Ambulatory Visit: Payer: Self-pay

## 2019-12-03 ENCOUNTER — Ambulatory Visit (INDEPENDENT_AMBULATORY_CARE_PROVIDER_SITE_OTHER): Payer: Medicare Other | Admitting: Cardiology

## 2019-12-03 ENCOUNTER — Encounter: Payer: Self-pay | Admitting: Cardiology

## 2019-12-03 VITALS — BP 90/66 | HR 92 | Ht 61.0 in | Wt 165.0 lb

## 2019-12-03 DIAGNOSIS — E119 Type 2 diabetes mellitus without complications: Secondary | ICD-10-CM

## 2019-12-03 DIAGNOSIS — E782 Mixed hyperlipidemia: Secondary | ICD-10-CM

## 2019-12-03 DIAGNOSIS — Z1329 Encounter for screening for other suspected endocrine disorder: Secondary | ICD-10-CM

## 2019-12-03 DIAGNOSIS — I2510 Atherosclerotic heart disease of native coronary artery without angina pectoris: Secondary | ICD-10-CM | POA: Diagnosis not present

## 2019-12-03 MED ORDER — RANOLAZINE ER 500 MG PO TB12: 500.0000 mg | ORAL_TABLET | Freq: Two times a day (BID) | ORAL | 12 refills | Status: DC

## 2019-12-03 NOTE — Patient Instructions (Signed)
Medication Instructions:  Your physician has recommended you make the following change in your medication:  You need to start Ranolazine 500 mg two times a day.  *If you need a refill on your cardiac medications before your next appointment, please call your pharmacy*  Lab Work: You had a BMET, CBC and TSH done today. You need to be fasting at your 1 month follow up. If you have labs (blood work) drawn today and your tests are completely normal, you will receive your results only by: Marland Kitchen MyChart Message (if you have MyChart) OR . A paper copy in the mail If you have any lab test that is abnormal or we need to change your treatment, we will call you to review the results.  Testing/Procedures: None ordered  Follow-Up: At [facility], you and your health needs are our priority.  As part of our continuing mission to provide you with exceptional heart care, we have created designated Provider Care Teams.  These Care Teams include your primary Cardiologist (physician) and Advanced Practice Providers (APPs -  Physician Assistants and Nurse Practitioners) who all work together to provide you with the care you need, when you need it.  Your next appointment:   1 month(s)  The format for your next appointment:   In Person  Provider:   Jyl Heinz, MD  Other Instructions NA

## 2019-12-03 NOTE — Progress Notes (Signed)
Cardiology Office Note:    Date:  12/03/2019   ID:  Vanessa Johnson, DOB November 13, 1935, MRN [id number]  PCP:  [name], MD  Cardiologist:  [name], MD   Referring MD: [name], MD    ASSESSMENT:    1. Coronary artery disease involving native coronary artery of native heart without angina pectoris   2. Mixed dyslipidemia   3. Diabetes mellitus without complication ([place])    PLAN:    In order of problems listed above:  1. Coronary artery disease: The details of echocardiogram are discussed below also the coronary angiography report has been detailed.  Secondary prevention stressed with the patient.  Importance of compliance with diet and medication stressed and she vocalized understanding.  Secondary prevention stressed with her extensively.  Cardiomyopathy: Appropriate measures were discussed.  Her blood pressure is borderline and she is taking losartan.  She has no symptoms of postural hypotension and asked her about this in detail manner.  She will continue this medication as it will give [place] best sure to get her ejection fraction better.  She is agreeable for this.  In the future depending on her ejection fraction I could consider changing it but at this point I want to give her the best treatment for getting her cardiomyopathy issues normalized. 2. Mixed dyslipidemia and diabetes mellitus: Diet was discussed.  She will have a Chem-7 and a CBC today.  She will be seen in follow-up appointment in a month or earlier if she has any concerns at that time I will get complete blood work including fasting lipids.   Medication Adjustments/Labs and Tests Ordered: Current medicines are reviewed at length with the patient today.  Concerns regarding medicines are outlined above.  No orders of the defined types were placed in this encounter.  No orders of the defined types were placed in this encounter.    Chief Complaint  Patient presents with  . Follow-up    1 Month     History  of Present Illness:    Vanessa Johnson is a 84 y.o. female.  Patient has past medical history of coronary artery disease, essential hypertension dyslipidemia and diabetes mellitus.  She has mild renal insufficiency.  She underwent coronary angiography and was found to have nonobstructive disease and moderate systolic dysfunction.  She has some mitral regurgitation also.  She denies any problems at this time and takes care of activities of daily living.  No chest pain orthopnea or PND.  At the time of my evaluation, the patient is alert awake oriented and in no distress.  Past Medical History:  Diagnosis Date  . Allergic rhinitis   . Back pain    left lower back  . Benign essential HTN   . BMI 34.0-34.9,adult   . Bronchial asthma   . CAD (coronary artery disease)    Heart cath 01/2017 [facility]  . Diabetes mellitus ([place])   . Gastritis    Dr. Orlena Sheldon- EGD 2018  . GERD (gastroesophageal reflux disease)   . Hyperlipidemia   . Hypothyroid   . Ischiogluteal bursitis of left side   . Pre-ulcerative corn or callous   . Sleep apnea    On CPAP  . Vitamin D deficiency     Past Surgical History:  Procedure Laterality Date  . BREAST BIOPSY  1970, 1980, and 2000  . CATARACT EXTRACTION, BILATERAL  2016  . CHOLECYSTECTOMY  2017  . DILATION AND CURETTAGE OF UTERUS  1996  . HEMORRHOIDECTOMY WITH HEMORRHOID BANDING  [place] CATH AND CORONARY ANGIOGRAPHY  01/2017   [facility]- No intervention  . TONSILLECTOMY  1965  . TOTAL HIP ARTHROPLASTY Bilateral    left- 2005 and right- 2010    Current Medications: Current Meds  Medication Sig  . albuterol (PROVENTIL) (2.5 MG/3ML) 0.083% nebulizer solution Take 2.5 mg by nebulization every 6 (six) hours as needed for wheezing or shortness of breath.   Marland Kitchen aspirin EC 81 MG tablet Take 1 tablet (81 mg total) by mouth daily.  Marland Kitchen b complex vitamins tablet Take 1 tablet by mouth daily.  . calcium carbonate (CALCIUM 600) 600 MG TABS  tablet Take 600 mg by mouth daily with breakfast.   . cyclobenzaprine (FLEXERIL) 10 MG tablet Take 10 mg by mouth 3 (three) times daily as needed.   . FEROSUL 325 (65 Fe) MG tablet Take 1 tablet by mouth daily.  Marland Kitchen FIBER SELECT GUMMIES CHEW Chew 1 tablet by mouth daily. powder  . fluticasone (FLONASE) 50 MCG/ACT nasal spray USE 2 SPRAYS IN EACH NOSTRIL DAILY  . Fluticasone-Salmeterol (ADVAIR) 250-50 MCG/DOSE AEPB Inhale into the lungs.  . furosemide (LASIX) 20 MG tablet Take 1 tablet (20 mg total) by mouth 2 (two) times daily.  Marland Kitchen levalbuterol (XOPENEX) 0.31 MG/3ML nebulizer solution Take 1 ampule by nebulization every 4 (four) hours as needed for wheezing or shortness of breath.   . levothyroxine (SYNTHROID, LEVOTHROID) 100 MCG tablet Take 100 mcg by mouth daily before breakfast.   . meclizine (ANTIVERT) 25 MG tablet Take 25 mg by mouth every 8 (eight) hours as needed. Every 8-12 hours as needed  . metFORMIN (GLUCOPHAGE) 500 MG tablet Take 500 mg by mouth 2 (two) times daily.   . metoprolol succinate (TOPROL-XL) 100 MG 24 hr tablet Take 100 mg by mouth daily. Take with or immediately following a meal.   . montelukast (SINGULAIR) 10 MG tablet Take 10 mg by mouth at bedtime.   . nitroGLYCERIN (NITROSTAT) 0.4 MG SL tablet Place 1 tablet (0.4 mg total) under the tongue every 5 (five) minutes as needed for chest pain.  . pantoprazole (PROTONIX) 40 MG tablet Take 40 mg by mouth daily.  . polyethylene glycol (MIRALAX / GLYCOLAX) packet Take 17 g by mouth daily as needed.   . potassium chloride (K-DUR) 10 MEQ tablet Take 10 mEq by mouth daily.  . rosuvastatin (CRESTOR) 10 MG tablet Take 1 tablet (10 mg total) by mouth daily. Take 1/2 tablet (5 mg) daily  . Vitamin D, Ergocalciferol, (DRISDOL) 50000 units CAPS capsule Take 50,000 Units by mouth every 7 (seven) days.   . vitamin E 400 UNIT capsule Take 400 Units by mouth daily.      Allergies:   Tussionex pennkinetic er [hydrocod polst-cpm polst er],  Cefuroxime axetil, Cephalexin, Codeine, Iohexol, Sulfamethoxazole, Cephalosporins, Hydrocodone-chlorpheniramine, and Moxifloxacin   Social History   Socioeconomic History  . Marital status: Widowed    Spouse name: Not on file  . Number of children: Not on file  . Years of education: Not on file  . Highest education level: Not on file  Occupational History  . Not on file  Tobacco Use  . Smoking status: Former Research scientist (life sciences)  . Smokeless tobacco: Never Used  Substance and Sexual Activity  . Alcohol use: No  . Drug use: No  . Sexual activity: Not on file  Other Topics Concern  . Not on file  Social History Narrative  . Not on file   Social Determinants of  Health   Financial Resource Strain:   . Difficulty of Paying Living Expenses: Not on file  Food Insecurity:   . Worried About Charity fundraiser in the Last Year: Not on file  . Ran Out of Food in the Last Year: Not on file  Transportation Needs:   . Lack of Transportation (Medical): Not on file  . Lack of Transportation (Non-Medical): Not on file  Physical Activity:   . Days of Exercise per Week: Not on file  . Minutes of Exercise per Session: Not on file  Stress:   . Feeling of Stress : Not on file  Social Connections:   . Frequency of Communication with Friends and Family: Not on file  . Frequency of Social Gatherings with Friends and Family: Not on file  . Attends Religious Services: Not on file  . Active Member of Clubs or Organizations: Not on file  . Attends Archivist Meetings: Not on file  . Marital Status: Not on file     Family History: The patient's family history includes Asthma in her brother and sister; Breast cancer in her sister; COPD in her brother and sister; Diabetes in her brother, mother, and sister; Emphysema in her brother, father, and sister; Heart attack in her father and mother; Heart disease in her brother and mother; Hypertension in her father, mother, and sister; Lung cancer in her  brother.  ROS:   Please see the history of present illness.    All other systems reviewed and are negative.  EKGs/Labs/Other Studies Reviewed:    The following studies were reviewed today: Report from October 29, 2019 LMCA: Abnormal and Focal stenosis.  Lesion on LMCA: Ostial.40% stenosis 3 mm length . Pre procedure TIMI III  flow was noted. Good run off was present.The lesion was eccentric and  lightly calcified.  Comments:IVUS of ostial LM: somewhat skewed images however diagnostic.  moderate eccentric calcific plaque, luminal area >101mm2 LAD: Normal. LCx: Diffuse irregularity. RCA: Single stenosis.  Lesion on Prox RCA: Mid subsection.50% stenosis. Pre procedure TIMI III  flow was noted. Good run off was present. The lesion was diagnosed as Low  Risk (A). The lesion was tubular and lightly calcified.The lesion showed  evidence of not present thrombus and with irregular contour.   Summary echo findings of 10/20/2019 Severely reduced LV systolic function. Ejection fraction is visually estimated at 30-35% Moderate global LV hypokinesis with LV septal dysynchrony. Abnormal LV diastolic function. Normal right ventricular size and function. Mildly dilated left atrium. Mild-to-moderate mitral regurgitation. Est. PA systolic function 35 mmHg.   Recent Labs: 06/11/2019: ALT 32; Hemoglobin 12.7; Platelets 336 11/01/2019: BUN 22; Creatinine, Ser 1.31; Potassium 3.4; Sodium 134; TSH 0.718  Recent Lipid Panel    Component Value Date/Time   CHOL 136 06/11/2019 1035   TRIG 175 (H) 06/11/2019 1035   HDL 43 06/11/2019 1035   CHOLHDL 3.2 06/11/2019 1035   LDLCALC 58 06/11/2019 1035    Physical Exam:    VS:  BP 90/66   Pulse 92   Ht 5\' 1"  (1.549 m)   Wt 165 lb (74.8 kg)   SpO2 97%   BMI 31.18 kg/m     Wt Readings from Last 3 Encounters:  12/03/19 165 lb (74.8 kg)  11/01/19 162 lb 6.4 oz (73.7 kg)  05/31/19 182 lb (82.6 kg)     GEN: Patient is in no acute distress  HEENT: Normal NECK: No JVD; No carotid bruits LYMPHATICS: No lymphadenopathy CARDIAC: Hear sounds regular,  2/6 systolic murmur at the apex. RESPIRATORY:  Clear to auscultation without rales, wheezing or rhonchi  ABDOMEN: Soft, non-tender, non-distended MUSCULOSKELETAL:  No edema; No deformity  SKIN: Warm and dry NEUROLOGIC:  Alert and oriented x 3 PSYCHIATRIC:  Normal affect   Signed, [name], MD  12/03/2019 2:57 PM    [facility]

## 2019-12-04 LAB — CBC WITH DIFFERENTIAL/PLATELET
Basophils Absolute: 0.1 10*3/uL (ref 0.0–0.2)
Basos: 1 %
EOS (ABSOLUTE): 0.3 10*3/uL (ref 0.0–0.4)
Eos: 3 %
Hematocrit: 33.8 % — ABNORMAL LOW (ref 34.0–46.6)
Hemoglobin: 11.3 g/dL (ref 11.1–15.9)
Immature Grans (Abs): 0 10*3/uL (ref 0.0–0.1)
Immature Granulocytes: 0 %
Lymphocytes Absolute: 2.3 10*3/uL (ref 0.7–3.1)
Lymphs: 30 %
MCH: 27 pg (ref 26.6–33.0)
MCHC: 33.4 g/dL (ref 31.5–35.7)
MCV: 81 fL (ref 79–97)
Monocytes Absolute: 0.7 10*3/uL (ref 0.1–0.9)
Monocytes: 9 %
Neutrophils Absolute: 4.3 10*3/uL (ref 1.4–7.0)
Neutrophils: 57 %
Platelets: 487 10*3/uL — ABNORMAL HIGH (ref 150–450)
RBC: 4.19 x10E6/uL (ref 3.77–5.28)
RDW: 13.1 % (ref 11.7–15.4)
WBC: 7.7 10*3/uL (ref 3.4–10.8)

## 2019-12-04 LAB — BASIC METABOLIC PANEL
BUN/Creatinine Ratio: 15 (ref 12–28)
BUN: 15 mg/dL (ref 8–27)
CO2: 25 mmol/L (ref 20–29)
Calcium: 9.7 mg/dL (ref 8.7–10.3)
Chloride: 95 mmol/L — ABNORMAL LOW (ref 96–106)
Creatinine, Ser: 1.03 mg/dL — ABNORMAL HIGH (ref 0.57–1.00)
GFR calc Af Amer: 59 mL/min/{1.73_m2} — ABNORMAL LOW (ref >59)
GFR calc non Af Amer: 51 mL/min/{1.73_m2} — ABNORMAL LOW (ref >59)
Glucose: 97 mg/dL (ref 65–99)
Potassium: 4.5 mmol/L (ref 3.5–5.2)
Sodium: 136 mmol/L (ref 134–144)

## 2019-12-04 LAB — TSH: TSH: 3.24 u[IU]/mL (ref 0.450–4.500)

## 2019-12-05 ENCOUNTER — Telehealth: Payer: Self-pay

## 2019-12-05 NOTE — Telephone Encounter (Signed)
Results reviewed with pt as per Dr. Julien Nordmann review. Pt verbalized understanding and had no additional questions.

## 2019-12-06 ENCOUNTER — Telehealth: Payer: Self-pay | Admitting: Cardiology

## 2019-12-06 NOTE — Telephone Encounter (Signed)
Pt c/o medication issue:  1. Name of Medication: ranolazine (RANEXA) 500 MG 12 hr tablet  2. How are you currently taking this medication (dosage and times per day)? As directed  3. Are you having a reaction (difficulty breathing--STAT)? no  4. What is your medication issue? Patient states that on the medication bottle it lists medications that she should not mix the medication with and she is on 8 of the medications it lists.

## 2019-12-06 NOTE — Telephone Encounter (Signed)
Returned call to patient.  She states that the Ranexa will interact with all her other medications, she read about this on the internet.    Explained that in reality there are only a couple of medications that she takes that could interact - metformin and metoprolol.  Per Up To Date metformin fine if dose is < 1700 mg/day, she takes 1500 mg/day.  Also have potential to increase concentration of metoprolol if both are taken.  She is on metoprolol 100 mg qd and HR stable > 60.    Assured her that she is fine with her current medications, but if she has further worries, she can reach out to Dr. Dereck Leep when she sees him in March.

## 2019-12-07 DIAGNOSIS — Z20828 Contact with and (suspected) exposure to other viral communicable diseases: Secondary | ICD-10-CM | POA: Diagnosis not present

## 2019-12-26 DIAGNOSIS — R5383 Other fatigue: Secondary | ICD-10-CM | POA: Diagnosis not present

## 2019-12-26 DIAGNOSIS — J310 Chronic rhinitis: Secondary | ICD-10-CM | POA: Diagnosis not present

## 2019-12-26 DIAGNOSIS — J4520 Mild intermittent asthma, uncomplicated: Secondary | ICD-10-CM | POA: Diagnosis not present

## 2019-12-26 DIAGNOSIS — G4733 Obstructive sleep apnea (adult) (pediatric): Secondary | ICD-10-CM | POA: Diagnosis not present

## 2019-12-31 ENCOUNTER — Ambulatory Visit: Payer: Medicare Other | Admitting: Cardiology

## 2020-01-01 ENCOUNTER — Other Ambulatory Visit: Payer: Self-pay

## 2020-01-01 ENCOUNTER — Encounter: Payer: Self-pay | Admitting: Cardiology

## 2020-01-01 ENCOUNTER — Ambulatory Visit (INDEPENDENT_AMBULATORY_CARE_PROVIDER_SITE_OTHER): Payer: Medicare Other | Admitting: Cardiology

## 2020-01-01 VITALS — BP 88/66 | HR 90 | Ht 61.0 in | Wt 158.0 lb

## 2020-01-01 DIAGNOSIS — I10 Essential (primary) hypertension: Secondary | ICD-10-CM

## 2020-01-01 DIAGNOSIS — E782 Mixed hyperlipidemia: Secondary | ICD-10-CM

## 2020-01-01 DIAGNOSIS — E119 Type 2 diabetes mellitus without complications: Secondary | ICD-10-CM | POA: Diagnosis not present

## 2020-01-01 DIAGNOSIS — I2510 Atherosclerotic heart disease of native coronary artery without angina pectoris: Secondary | ICD-10-CM

## 2020-01-01 NOTE — Progress Notes (Addendum)
Cardiology Office Note:    Date:  01/01/2020   ID:  Vanessa Johnson, DOB November 14, 1935, MRN [id number]  PCP:  [name], MD  Cardiologist:  [name], MD   Referring MD: [name], MD    ASSESSMENT:    1. Coronary artery disease involving native coronary artery of native heart without angina pectoris   2. Essential hypertension   3. Mixed dyslipidemia   4. Diabetes mellitus without complication ([place])    PLAN:    In order of problems listed above:  1. Congestive heart failure and cardiomyopathy: Secondary prevention stressed with the patient.  Importance of compliance with diet medication stressed.  Salt intake issues were discussed with her at length and she promises to comply.  She was advised to weigh herself on a regular basis.  Her ejection fraction was 30 to 35% in December.  She has been on optimal medical therapy.  We will repeat her echocardiogram and if ejection fraction is the same then I will refer her to my electrophysiology colleagues for possible defibrillator and other therapies for congestive heart failure.  She is agreeable on this. 2. Coronary artery disease: Secondary prevention stressed.  Importance of compliance with diet and medication stressed and weight reduction stressed 3. Essential hypertension: Blood pressure is stable 4. Mixed dyslipidemia: Diet was discussed.  Lipids were reviewed Patient is on multiple medications will have Chem-7 today.  Her EKG has revealed left bundle branch block and I will also add troponin to her blood work. She will be seen in follow-up appointment in 2 weeks or earlier if she has any concerns.   Medication Adjustments/Labs and Tests Ordered: Current medicines are reviewed at length with the patient today.  Concerns regarding medicines are outlined above.  No orders of the defined types were placed in this encounter.  No orders of the defined types were placed in this encounter.    No chief complaint on file.     History of Present Illness:    Vanessa Johnson is a 84 y.o. female.  Patient has past medical history of coronary artery disease, cardiomyopathy with an ejection fraction of 30 to 35%, essential hypertension mixed dyslipidemia and diabetes mellitus.  She mentions to me that her shortness of breath is getting worse.  She called our office and was advised to take diuretics and has initiated them.  Her blood pressure is borderline.  She feels fine but she goes through all ups and downs with congestive heart failure-like symptoms.  At the time of my evaluation, the patient is alert awake oriented and in no distress.  Past Medical History:  Diagnosis Date  . Allergic rhinitis   . Back pain    left lower back  . Benign essential HTN   . BMI 34.0-34.9,adult   . Bronchial asthma   . CAD (coronary artery disease)    Heart cath 01/2017 [facility]  . Diabetes mellitus ([place])   . Gastritis    Dr. Orlena Sheldon- EGD 2018  . GERD (gastroesophageal reflux disease)   . Hyperlipidemia   . Hypothyroid   . Ischiogluteal bursitis of left side   . Pre-ulcerative corn or callous   . Sleep apnea    On CPAP  . Vitamin D deficiency     Past Surgical History:  Procedure Laterality Date  . BREAST BIOPSY  1970, 1980, and 2000  . CATARACT EXTRACTION, BILATERAL  2016  . CHOLECYSTECTOMY  2017  . DILATION AND CURETTAGE OF UTERUS  1996  . HEMORRHOIDECTOMY  WITH HEMORRHOID BANDING  1970  . LEFT HEART CATH AND CORONARY ANGIOGRAPHY  01/2017   [facility]- No intervention  . TONSILLECTOMY  1965  . TOTAL HIP ARTHROPLASTY Bilateral    left- 2005 and right- 2010    Current Medications: Current Meds  Medication Sig  . albuterol (PROVENTIL) (2.5 MG/3ML) 0.083% nebulizer solution Take 2.5 mg by nebulization every 6 (six) hours as needed for wheezing or shortness of breath.   Marland Kitchen aspirin EC 81 MG tablet Take 1 tablet (81 mg total) by mouth daily.  Marland Kitchen b complex vitamins tablet Take 1 tablet by mouth daily.   . calcium carbonate (CALCIUM 600) 600 MG TABS tablet Take 600 mg by mouth daily with breakfast.   . cyclobenzaprine (FLEXERIL) 10 MG tablet Take 10 mg by mouth 3 (three) times daily as needed.   . donepezil (ARICEPT) 10 MG tablet Take 10 mg by mouth daily.  . FEROSUL 325 (65 Fe) MG tablet Take 1 tablet by mouth daily.  Marland Kitchen FIBER SELECT GUMMIES CHEW Chew 1 tablet by mouth daily. powder  . fluticasone (FLONASE) 50 MCG/ACT nasal spray USE 2 SPRAYS IN EACH NOSTRIL DAILY  . Fluticasone-Salmeterol (ADVAIR) 250-50 MCG/DOSE AEPB Inhale into the lungs.  . furosemide (LASIX) 20 MG tablet Take 1 tablet (20 mg total) by mouth 2 (two) times daily.  . hydrochlorothiazide (HYDRODIURIL) 25 MG tablet   . levalbuterol (XOPENEX) 0.31 MG/3ML nebulizer solution Take 1 ampule by nebulization every 4 (four) hours as needed for wheezing or shortness of breath.   . levothyroxine (SYNTHROID) 88 MCG tablet Take 88 mcg by mouth daily.  . meclizine (ANTIVERT) 25 MG tablet Take 25 mg by mouth every 8 (eight) hours as needed. Every 8-12 hours as needed  . metFORMIN (GLUCOPHAGE) 500 MG tablet Take 500 mg by mouth 2 (two) times daily.   . metoprolol succinate (TOPROL-XL) 100 MG 24 hr tablet Take 100 mg by mouth daily. Take with or immediately following a meal.   . montelukast (SINGULAIR) 10 MG tablet Take 10 mg by mouth at bedtime.   Marland Kitchen NAMZARIC 14-10 MG CP24 Take 1 capsule by mouth daily.  . nitroGLYCERIN (NITROSTAT) 0.4 MG SL tablet Place 1 tablet (0.4 mg total) under the tongue every 5 (five) minutes as needed for chest pain.  Marland Kitchen nystatin-triamcinolone (MYCOLOG II) cream Apply 1 application topically 2 (two) times daily as needed (irritation).   . pantoprazole (PROTONIX) 40 MG tablet Take 40 mg by mouth daily.  . polyethylene glycol (MIRALAX / GLYCOLAX) packet Take 17 g by mouth daily as needed.   . potassium chloride (K-DUR) 10 MEQ tablet Take 10 mEq by mouth daily.  . ranolazine (RANEXA) 500 MG 12 hr tablet Take 1 tablet  (500 mg total) by mouth 2 (two) times daily.  . rosuvastatin (CRESTOR) 10 MG tablet Take 1 tablet (10 mg total) by mouth daily. Take 1/2 tablet (5 mg) daily  . Vitamin D, Ergocalciferol, (DRISDOL) 50000 units CAPS capsule Take 50,000 Units by mouth every 7 (seven) days.   . vitamin E 400 UNIT capsule Take 400 Units by mouth daily.      Allergies:   Tussionex pennkinetic er [hydrocod polst-cpm polst er], Cefuroxime axetil, Cephalexin, Codeine, Iohexol, Sulfamethoxazole, Cephalosporins, Hydrocodone-chlorpheniramine, and Moxifloxacin   Social History   Socioeconomic History  . Marital status: Widowed    Spouse name: Not on file  . Number of children: Not on file  . Years of education: Not on file  . Highest education  level: Not on file  Occupational History  . Not on file  Tobacco Use  . Smoking status: Former Research scientist (life sciences)  . Smokeless tobacco: Never Used  Substance and Sexual Activity  . Alcohol use: No  . Drug use: No  . Sexual activity: Not on file  Other Topics Concern  . Not on file  Social History Narrative  . Not on file   Social Determinants of Health   Financial Resource Strain:   . Difficulty of Paying Living Expenses: Not on file  Food Insecurity:   . Worried About Charity fundraiser in the Last Year: Not on file  . Ran Out of Food in the Last Year: Not on file  Transportation Needs:   . Lack of Transportation (Medical): Not on file  . Lack of Transportation (Non-Medical): Not on file  Physical Activity:   . Days of Exercise per Week: Not on file  . Minutes of Exercise per Session: Not on file  Stress:   . Feeling of Stress : Not on file  Social Connections:   . Frequency of Communication with Friends and Family: Not on file  . Frequency of Social Gatherings with Friends and Family: Not on file  . Attends Religious Services: Not on file  . Active Member of Clubs or Organizations: Not on file  . Attends Archivist Meetings: Not on file  . Marital Status:  Not on file     Family History: The patient's family history includes Asthma in her brother and sister; Breast cancer in her sister; COPD in her brother and sister; Diabetes in her brother, mother, and sister; Emphysema in her brother, father, and sister; Heart attack in her father and mother; Heart disease in her brother and mother; Hypertension in her father, mother, and sister; Lung cancer in her brother.  ROS:   Please see the history of present illness.    All other systems reviewed and are negative.  EKGs/Labs/Other Studies Reviewed:    The following studies were reviewed today: I discussed my findings with the patient at length.  EKG reveals sinus rhythm and nonspecific ST-T changes.   Recent Labs: 06/11/2019: ALT 32 12/03/2019: BUN 15; Creatinine, Ser 1.03; Hemoglobin 11.3; Platelets 487; Potassium 4.5; Sodium 136; TSH 3.240  Recent Lipid Panel    Component Value Date/Time   CHOL 136 06/11/2019 1035   TRIG 175 (H) 06/11/2019 1035   HDL 43 06/11/2019 1035   CHOLHDL 3.2 06/11/2019 1035   LDLCALC 58 06/11/2019 1035    Physical Exam:    VS:  BP (!) 88/66   Pulse 90   Ht 5\' 1"  (1.549 m)   Wt 158 lb (71.7 kg)   SpO2 97%   BMI 29.85 kg/m     Wt Readings from Last 3 Encounters:  01/01/20 158 lb (71.7 kg)  12/03/19 165 lb (74.8 kg)  11/01/19 162 lb 6.4 oz (73.7 kg)     GEN: Patient is in no acute distress HEENT: Normal NECK: No JVD; No carotid bruits LYMPHATICS: No lymphadenopathy CARDIAC: Hear sounds regular, 2/6 systolic murmur at the apex. RESPIRATORY:  Clear to auscultation without rales, wheezing or rhonchi  ABDOMEN: Soft, non-tender, non-distended MUSCULOSKELETAL:  No edema; No deformity  SKIN: Warm and dry NEUROLOGIC:  Alert and oriented x 3 PSYCHIATRIC:  Normal affect   Signed, [name], MD  01/01/2020 9:05 AM    [place]

## 2020-01-01 NOTE — Patient Instructions (Signed)
Medication Instructions:  No medication changes *If you need a refill on your cardiac medications before your next appointment, please call your pharmacy*   Lab Work: You had a BMET today in the office. If you have labs (blood work) drawn today and your tests are completely normal, you will receive your results only by: Marland Kitchen MyChart Message (if you have MyChart) OR . A paper copy in the mail If you have any lab test that is abnormal or we need to change your treatment, we will call you to review the results.   Testing/Procedures: Your physician has requested that you have an echocardiogram. Echocardiography is a painless test that uses sound waves to create images of your heart. It provides your doctor with information about the size and shape of your heart and how well your heart's chambers and valves are working. This procedure takes approximately one hour. There are no restrictions for this procedure.     Follow-Up: At [facility], you and your health needs are our priority.  As part of our continuing mission to provide you with exceptional heart care, we have created designated Provider Care Teams.  These Care Teams include your primary Cardiologist (physician) and Advanced Practice Providers (APPs -  Physician Assistants and Nurse Practitioners) who all work together to provide you with the care you need, when you need it.  We recommend signing up for the patient portal called "MyChart".  Sign up information is provided on this After Visit Summary.  MyChart is used to connect with patients for Virtual Visits (Telemedicine).  Patients are able to view lab/test results, encounter notes, upcoming appointments, etc.  Non-urgent messages can be sent to your provider as well.   To learn more about what you can do with MyChart, go to [email or web address].    Your next appointment:   2 week(s)  The format for your next appointment:   In Person  Provider:   Jyl Heinz,  MD   Other Instructions  Echocardiogram An echocardiogram is a procedure that uses painless sound waves (ultrasound) to produce an image of the heart. Images from an echocardiogram can provide important information about:  Signs of coronary artery disease (CAD).  Aneurysm detection. An aneurysm is a weak or damaged part of an artery wall that bulges out from the normal force of blood pumping through the body.  Heart size and shape. Changes in the size or shape of the heart can be associated with certain conditions, including heart failure, aneurysm, and CAD.  Heart muscle function.  Heart valve function.  Signs of a past heart attack.  Fluid buildup around the heart.  Thickening of the heart muscle.  A tumor or infectious growth around the heart valves. Tell a health care provider about:  Any allergies you have.  All medicines you are taking, including vitamins, herbs, eye drops, creams, and over-the-counter medicines.  Any blood disorders you have.  Any surgeries you have had.  Any medical conditions you have.  Whether you are pregnant or may be pregnant. What are the risks? Generally, this is a safe procedure. However, problems may occur, including:  Allergic reaction to dye (contrast) that may be used during the procedure. What happens before the procedure? No specific preparation is needed. You may eat and drink normally. What happens during the procedure?   An IV tube may be inserted into one of your veins.  You may receive contrast through this tube. A contrast is an injection that improves the quality of the  pictures from your heart.  A gel will be applied to your chest.  A wand-like tool (transducer) will be moved over your chest. The gel will help to transmit the sound waves from the transducer.  The sound waves will harmlessly bounce off of your heart to allow the heart images to be captured in real-time motion. The images will be recorded on a  computer. The procedure may vary among health care providers and hospitals. What happens after the procedure?  You may return to your normal, everyday life, including diet, activities, and medicines, unless your health care provider tells you not to do that. Summary  An echocardiogram is a procedure that uses painless sound waves (ultrasound) to produce an image of the heart.  Images from an echocardiogram can provide important information about the size and shape of your heart, heart muscle function, heart valve function, and fluid buildup around your heart.  You do not need to do anything to prepare before this procedure. You may eat and drink normally.  After the echocardiogram is completed, you may return to your normal, everyday life, unless your health care provider tells you not to do that. This information is not intended to replace advice given to you by your health care provider. Make sure you discuss any questions you have with your health care provider. Document Revised: 02/01/2019 Document Reviewed: 11/13/2016 Elsevier Patient Education  [place].

## 2020-01-01 NOTE — Addendum Note (Signed)
Addended by: Truddie Hidden on: 01/01/2020 09:40 AM   Modules accepted: Orders

## 2020-01-02 ENCOUNTER — Encounter: Payer: Self-pay | Admitting: *Deleted

## 2020-01-02 DIAGNOSIS — E119 Type 2 diabetes mellitus without complications: Secondary | ICD-10-CM | POA: Diagnosis not present

## 2020-01-02 DIAGNOSIS — E785 Hyperlipidemia, unspecified: Secondary | ICD-10-CM | POA: Diagnosis not present

## 2020-01-02 DIAGNOSIS — E559 Vitamin D deficiency, unspecified: Secondary | ICD-10-CM | POA: Diagnosis not present

## 2020-01-02 DIAGNOSIS — I10 Essential (primary) hypertension: Secondary | ICD-10-CM | POA: Diagnosis not present

## 2020-01-02 LAB — BASIC METABOLIC PANEL
BUN/Creatinine Ratio: 16 (ref 12–28)
BUN: 20 mg/dL (ref 8–27)
CO2: 26 mmol/L (ref 20–29)
Calcium: 10.1 mg/dL (ref 8.7–10.3)
Chloride: 95 mmol/L — ABNORMAL LOW (ref 96–106)
Creatinine, Ser: 1.23 mg/dL — ABNORMAL HIGH (ref 0.57–1.00)
GFR calc Af Amer: 47 mL/min/{1.73_m2} — ABNORMAL LOW (ref >59)
GFR calc non Af Amer: 41 mL/min/{1.73_m2} — ABNORMAL LOW (ref >59)
Glucose: 117 mg/dL — ABNORMAL HIGH (ref 65–99)
Potassium: 4.5 mmol/L (ref 3.5–5.2)
Sodium: 136 mmol/L (ref 134–144)

## 2020-01-02 LAB — TROPONIN I: Troponin I: 0.02 ng/mL (ref 0.00–0.04)

## 2020-01-08 ENCOUNTER — Other Ambulatory Visit: Payer: Medicare Other

## 2020-01-08 ENCOUNTER — Other Ambulatory Visit: Payer: Self-pay

## 2020-01-08 ENCOUNTER — Ambulatory Visit (INDEPENDENT_AMBULATORY_CARE_PROVIDER_SITE_OTHER): Payer: Medicare Other

## 2020-01-08 DIAGNOSIS — I2510 Atherosclerotic heart disease of native coronary artery without angina pectoris: Secondary | ICD-10-CM

## 2020-01-08 NOTE — Progress Notes (Unsigned)
Complete echocardiogram has been performed.  Jimmy Bell Carbo RDCS, RVT 

## 2020-01-14 ENCOUNTER — Ambulatory Visit (INDEPENDENT_AMBULATORY_CARE_PROVIDER_SITE_OTHER): Payer: Medicare Other | Admitting: Cardiology

## 2020-01-14 ENCOUNTER — Other Ambulatory Visit: Payer: Self-pay

## 2020-01-14 ENCOUNTER — Encounter: Payer: Self-pay | Admitting: Cardiology

## 2020-01-14 VITALS — BP 88/62 | HR 80 | Ht 61.0 in | Wt 157.0 lb

## 2020-01-14 DIAGNOSIS — E119 Type 2 diabetes mellitus without complications: Secondary | ICD-10-CM | POA: Diagnosis not present

## 2020-01-14 DIAGNOSIS — I2510 Atherosclerotic heart disease of native coronary artery without angina pectoris: Secondary | ICD-10-CM

## 2020-01-14 DIAGNOSIS — Z1329 Encounter for screening for other suspected endocrine disorder: Secondary | ICD-10-CM

## 2020-01-14 DIAGNOSIS — Z1321 Encounter for screening for nutritional disorder: Secondary | ICD-10-CM | POA: Diagnosis not present

## 2020-01-14 DIAGNOSIS — I10 Essential (primary) hypertension: Secondary | ICD-10-CM

## 2020-01-14 DIAGNOSIS — E559 Vitamin D deficiency, unspecified: Secondary | ICD-10-CM

## 2020-01-14 DIAGNOSIS — E782 Mixed hyperlipidemia: Secondary | ICD-10-CM

## 2020-01-14 MED ORDER — FUROSEMIDE 20 MG PO TABS: 10.0000 mg | ORAL_TABLET | Freq: Two times a day (BID) | ORAL | 1 refills | Status: DC

## 2020-01-14 NOTE — Progress Notes (Signed)
Cardiology Office Note:    Date:  01/14/2020   ID:  Vanessa Johnson, DOB August 14, 1936, MRN [id number]  PCP:  [name], MD  Cardiologist:  [name], MD   Referring MD: [name], MD    ASSESSMENT:    1. Coronary artery disease involving native coronary artery of native heart without angina pectoris   2. Essential hypertension   3. Diabetes mellitus without complication ([place])   4. Mixed dyslipidemia    PLAN:    In order of problems listed above:  1. Coronary artery disease: I discussed my findings with the patient at extensive length.  Secondary prevention stressed.  Importance of compliance with diet and medication stressed and she vocalized understanding. 2. Cardiomyopathy: Her ejection fraction is mildly better.  She has left bundle branch block on her EKG.  Her symptoms to me suggest a degree of possible postural hypotension and therefore I have asked her to cut her diuretic dose into half.  Salt intake issues were discussed.  Her blood pressure is borderline and it concerns me.  I will follow up with her to see how her pedal edema issues are.  In view of this I will also get a Chem-7 and a CBC today.  She has history of anemia also. 3. Essential hypertension: Blood pressure stable it is actually on the lower side without much medications. 4. Mixed dyslipidemia and diabetes mellitus: Diet was urged and she promises to do better.  Congestive heart failure education was given. 5. She requests a prescription for a walker and handicap sticker paperwork and we will do it today for her. 6. Patient will be seen in follow-up appointment in 1 month or earlier if the patient has any concerns    Medication Adjustments/Labs and Tests Ordered: Current medicines are reviewed at length with the patient today.  Concerns regarding medicines are outlined above.  No orders of the defined types were placed in this encounter.  No orders of the defined types were placed in this  encounter.    Chief Complaint  Patient presents with  . Follow-up    2 Weeks     History of Present Illness:    Vanessa Johnson is a 84 y.o. female.  Patient has past medical history of cardiomyopathy, coronary artery disease essential hypertension and dyslipidemia.  She denies any problems at this time and takes care of activities of daily living.  No chest pain orthopnea or PND.  She has some symptoms that may suggest postural hypotension.  At the time of my evaluation, the patient is alert awake oriented and in no distress.  Past Medical History:  Diagnosis Date  . Allergic rhinitis   . Back pain    left lower back  . Benign essential HTN   . BMI 34.0-34.9,adult   . Bronchial asthma   . Bronchitis 11/20/2018  . CAD (coronary artery disease)    Heart cath 01/2017 [facility]  . Cholelithiasis with cholecystitis without obstruction 12/04/2015  . Diabetes mellitus ([place])   . Diabetes mellitus without complication ([place]) 123XX123  . Dysfunction of right eustachian tube 11/20/2018  . Essential hypertension 05/17/2017  . Gastritis    Dr. Orlena Sheldon- EGD 2018  . GERD (gastroesophageal reflux disease)   . Hyperlipidemia   . Hypothyroid   . Ischiogluteal bursitis of left side   . Mixed dyslipidemia 05/17/2017  . Obstructive sleep apnea of adult 12/04/2015  . Pre-ulcerative corn or callous   . Sleep apnea    On CPAP  .  Tinea pedis of right foot 01/15/2016  . Unsteadiness 08/16/2018   Last Assessment & Plan:  Emergency department follow-up for evaluation of vertigo. Acute onset of vertigo 08/12/2018.  CT head at that time was okay.  Vertiginous symptoms resolved that day.  She persists with some unsteadiness but it is generally improving.  She has chronic history of unsteady that generally is helped by meclizine.  Denies any hearing loss symptoms or ringing in the ears. EXAM sh  . Vitamin D deficiency     Past Surgical History:  Procedure Laterality Date  . BREAST BIOPSY  1970, 1980, and  2000  . CATARACT EXTRACTION, BILATERAL  2016  . CHOLECYSTECTOMY  2017  . DILATION AND CURETTAGE OF UTERUS  1996  . [place]  . LEFT HEART CATH AND CORONARY ANGIOGRAPHY  01/2017   [facility]- No intervention  . TONSILLECTOMY  1965  . TOTAL HIP ARTHROPLASTY Bilateral    left- 2005 and right- 2010    Current Medications: Current Meds  Medication Sig  . albuterol (PROVENTIL) (2.5 MG/3ML) 0.083% nebulizer solution Take 2.5 mg by nebulization every 6 (six) hours as needed for wheezing or shortness of breath.   Marland Kitchen aspirin EC 81 MG tablet Take 1 tablet (81 mg total) by mouth daily.  Marland Kitchen b complex vitamins tablet Take 1 tablet by mouth daily.  . calcium carbonate (CALCIUM 600) 600 MG TABS tablet Take 600 mg by mouth daily with breakfast.   . cyclobenzaprine (FLEXERIL) 10 MG tablet Take 10 mg by mouth 3 (three) times daily as needed.   . FEROSUL 325 (65 Fe) MG tablet Take 1 tablet by mouth daily.  Marland Kitchen FIBER SELECT GUMMIES CHEW Chew 1 tablet by mouth daily. powder  . fluticasone (FLONASE) 50 MCG/ACT nasal spray USE 2 SPRAYS IN EACH NOSTRIL DAILY  . Fluticasone-Salmeterol (ADVAIR) 250-50 MCG/DOSE AEPB Inhale into the lungs.  . furosemide (LASIX) 20 MG tablet Take 1 tablet (20 mg total) by mouth 2 (two) times daily.  Marland Kitchen levalbuterol (XOPENEX) 0.31 MG/3ML nebulizer solution Take 1 ampule by nebulization every 4 (four) hours as needed for wheezing or shortness of breath.   . levothyroxine (SYNTHROID) 88 MCG tablet Take 88 mcg by mouth daily.  . meclizine (ANTIVERT) 25 MG tablet Take 25 mg by mouth every 8 (eight) hours as needed. Every 8-12 hours as needed  . metFORMIN (GLUCOPHAGE) 500 MG tablet Take 500 mg by mouth 2 (two) times daily.   . metoprolol succinate (TOPROL-XL) 100 MG 24 hr tablet Take 100 mg by mouth daily. Take with or immediately following a meal.   . montelukast (SINGULAIR) 10 MG tablet Take 10 mg by mouth at bedtime.   . nitroGLYCERIN  (NITROSTAT) 0.4 MG SL tablet Place 1 tablet (0.4 mg total) under the tongue every 5 (five) minutes as needed for chest pain.  Marland Kitchen nystatin-triamcinolone (MYCOLOG II) cream Apply 1 application topically 2 (two) times daily as needed (irritation).   . pantoprazole (PROTONIX) 40 MG tablet Take 40 mg by mouth daily.  . polyethylene glycol (MIRALAX / GLYCOLAX) packet Take 17 g by mouth daily as needed.   . potassium chloride (K-DUR) 10 MEQ tablet Take 10 mEq by mouth daily.  . ranolazine (RANEXA) 500 MG 12 hr tablet Take 1 tablet (500 mg total) by mouth 2 (two) times daily.  . Vitamin D, Ergocalciferol, (DRISDOL) 50000 units CAPS capsule Take 50,000 Units by mouth every 7 (seven) days.   . vitamin E 400  UNIT capsule Take 400 Units by mouth daily.      Allergies:   Tussionex pennkinetic er [hydrocod polst-cpm polst er], Cefuroxime axetil, Cephalexin, Codeine, Iohexol, Sulfamethoxazole, Cephalosporins, Hydrocodone-chlorpheniramine, and Moxifloxacin   Social History   Socioeconomic History  . Marital status: Widowed    Spouse name: Not on file  . Number of children: Not on file  . Years of education: Not on file  . Highest education level: Not on file  Occupational History  . Not on file  Tobacco Use  . Smoking status: Former Research scientist (life sciences)  . Smokeless tobacco: Never Used  Substance and Sexual Activity  . Alcohol use: No  . Drug use: No  . Sexual activity: Not on file  Other Topics Concern  . Not on file  Social History Narrative  . Not on file   Social Determinants of Health   Financial Resource Strain:   . Difficulty of Paying Living Expenses:   Food Insecurity:   . Worried About Charity fundraiser in the Last Year:   . Arboriculturist in the Last Year:   Transportation Needs:   . Film/video editor (Medical):   Marland Kitchen Lack of Transportation (Non-Medical):   Physical Activity:   . Days of Exercise per Week:   . Minutes of Exercise per Session:   Stress:   . Feeling of Stress :    Social Connections:   . Frequency of Communication with Friends and Family:   . Frequency of Social Gatherings with Friends and Family:   . Attends Religious Services:   . Active Member of Clubs or Organizations:   . Attends Archivist Meetings:   Marland Kitchen Marital Status:      Family History: The patient's family history includes Asthma in her brother and sister; Breast cancer in her sister; COPD in her brother and sister; Diabetes in her brother, mother, and sister; Emphysema in her brother, father, and sister; Heart attack in her father and mother; Heart disease in her brother and mother; Hypertension in her father, mother, and sister; Lung cancer in her brother.  ROS:   Please see the history of present illness.    All other systems reviewed and are negative.  EKGs/Labs/Other Studies Reviewed:    The following studies were reviewed today: IMPRESSIONS    1. Left ventricular ejection fraction, by estimation, is 35 to 40%. The  left ventricle has moderately decreased function. The left ventricle  demonstrates global hypokinesis. The left ventricular internal cavity size  was mildly dilated. There is moderate  concentric left ventricular hypertrophy. Left ventricular diastolic  parameters are consistent with Grade III diastolic dysfunction  (restrictive).  2. Right ventricular systolic function is normal. The right ventricular  size is normal. There is moderately elevated pulmonary artery systolic  pressure.  3. Left atrial size was severely dilated.  4. The mitral valve is normal in structure. Severe mitral valve  regurgitation. No evidence of mitral stenosis. EROA 0.37 cmsq, Regurgitant  Volume 63 ml.  5. Tricuspid valve regurgitation is moderate.  6. The aortic valve is normal in structure. Aortic valve regurgitation is  not visualized. No aortic stenosis is present.  7. The inferior vena cava is normal in size with greater than 50%  respiratory variability,  suggesting right atrial pressure of 3 mmHg.    Recent Labs: 06/11/2019: ALT 32 12/03/2019: Hemoglobin 11.3; Platelets 487; TSH 3.240 01/01/2020: BUN 20; Creatinine, Ser 1.23; Potassium 4.5; Sodium 136  Recent Lipid Panel    Component  Value Date/Time   CHOL 136 06/11/2019 1035   TRIG 175 (H) 06/11/2019 1035   HDL 43 06/11/2019 1035   CHOLHDL 3.2 06/11/2019 1035   LDLCALC 58 06/11/2019 1035    Physical Exam:    VS:  BP (!) 88/62   Pulse 80   Ht 5\' 1"  (1.549 m)   Wt 157 lb (71.2 kg)   SpO2 90%   BMI 29.66 kg/m     Wt Readings from Last 3 Encounters:  01/14/20 157 lb (71.2 kg)  01/01/20 158 lb (71.7 kg)  12/03/19 165 lb (74.8 kg)     GEN: Patient is in no acute distress HEENT: Normal NECK: No JVD; No carotid bruits LYMPHATICS: No lymphadenopathy CARDIAC: Hear sounds regular, 2/6 systolic murmur at the apex. RESPIRATORY:  Clear to auscultation without rales, wheezing or rhonchi  ABDOMEN: Soft, non-tender, non-distended MUSCULOSKELETAL:  No edema; No deformity  SKIN: Warm and dry NEUROLOGIC:  Alert and oriented x 3 PSYCHIATRIC:  Normal affect   Signed, [name], MD  01/14/2020 11:07 AM    [place]

## 2020-01-14 NOTE — Patient Instructions (Addendum)
Medication Instructions:  Your physician has recommended you make the following change in your medication:   Decrease your Furosemide to 10 mg daily. Take (1/2) of your 20 mg Furosemide.  *If you need a refill on your cardiac medications before your next appointment, please call your pharmacy*   Lab Work: You had a BMET and CBC. If you have labs (blood work) drawn today and your tests are completely normal, you will receive your results only by: Marland Kitchen MyChart Message (if you have MyChart) OR . A paper copy in the mail If you have any lab test that is abnormal or we need to change your treatment, we will call you to review the results.   Testing/Procedures: None ordered   Follow-Up: At [facility], you and your health needs are our priority.  As part of our continuing mission to provide you with exceptional heart care, we have created designated Provider Care Teams.  These Care Teams include your primary Cardiologist (physician) and Advanced Practice Providers (APPs -  Physician Assistants and Nurse Practitioners) who all work together to provide you with the care you need, when you need it.  We recommend signing up for the patient portal called "MyChart".  Sign up information is provided on this After Visit Summary.  MyChart is used to connect with patients for Virtual Visits (Telemedicine).  Patients are able to view lab/test results, encounter notes, upcoming appointments, etc.  Non-urgent messages can be sent to your provider as well.   To learn more about what you can do with MyChart, go to [email or web address].    Your next appointment:   1 month(s)  The format for your next appointment:   In Person  Provider:   Jyl Heinz, MD   Other Instructions NA

## 2020-01-14 NOTE — Addendum Note (Signed)
Addended by: Truddie Hidden on: 01/14/2020 11:48 AM   Modules accepted: Orders

## 2020-01-23 LAB — CBC WITH DIFFERENTIAL/PLATELET
Basophils Absolute: 0.1 10*3/uL (ref 0.0–0.2)
Basos: 1 %
EOS (ABSOLUTE): 0.2 10*3/uL (ref 0.0–0.4)
Eos: 4 %
Hematocrit: 29.3 % — ABNORMAL LOW (ref 34.0–46.6)
Hemoglobin: 9.4 g/dL — ABNORMAL LOW (ref 11.1–15.9)
Immature Grans (Abs): 0 10*3/uL (ref 0.0–0.1)
Immature Granulocytes: 0 %
Lymphocytes Absolute: 1.5 10*3/uL (ref 0.7–3.1)
Lymphs: 29 %
MCH: 26.1 pg — ABNORMAL LOW (ref 26.6–33.0)
MCHC: 32.1 g/dL (ref 31.5–35.7)
MCV: 81 fL (ref 79–97)
Monocytes Absolute: 0.5 10*3/uL (ref 0.1–0.9)
Monocytes: 9 %
Neutrophils Absolute: 2.9 10*3/uL (ref 1.4–7.0)
Neutrophils: 57 %
Platelets: 408 10*3/uL (ref 150–450)
RBC: 3.6 x10E6/uL — ABNORMAL LOW (ref 3.77–5.28)
RDW: 13.7 % (ref 11.7–15.4)
WBC: 5.2 10*3/uL (ref 3.4–10.8)

## 2020-01-23 LAB — BASIC METABOLIC PANEL
BUN/Creatinine Ratio: 14 (ref 12–28)
BUN: 21 mg/dL (ref 8–27)
CO2: 23 mmol/L (ref 20–29)
Calcium: 9.6 mg/dL (ref 8.7–10.3)
Chloride: 102 mmol/L (ref 96–106)
Creatinine, Ser: 1.45 mg/dL — ABNORMAL HIGH (ref 0.57–1.00)
GFR calc Af Amer: 39 mL/min/{1.73_m2} — ABNORMAL LOW (ref >59)
GFR calc non Af Amer: 34 mL/min/{1.73_m2} — ABNORMAL LOW (ref >59)
Glucose: 103 mg/dL — ABNORMAL HIGH (ref 65–99)
Potassium: 4.4 mmol/L (ref 3.5–5.2)
Sodium: 141 mmol/L (ref 134–144)

## 2020-01-23 LAB — VITAMIN D 1,25 DIHYDROXY
Vitamin D 1, 25 (OH)2 Total: 78 pg/mL — ABNORMAL HIGH
Vitamin D2 1, 25 (OH)2: 50 pg/mL
Vitamin D3 1, 25 (OH)2: 28 pg/mL

## 2020-01-23 LAB — TSH: TSH: 2.42 u[IU]/mL (ref 0.450–4.500)

## 2020-01-23 LAB — HEPATIC FUNCTION PANEL
ALT: 15 IU/L (ref 0–32)
AST: 22 IU/L (ref 0–40)
Albumin: 4.6 g/dL (ref 3.6–4.6)
Alkaline Phosphatase: 62 IU/L (ref 39–117)
Bilirubin Total: 0.6 mg/dL (ref 0.0–1.2)
Bilirubin, Direct: 0.2 mg/dL (ref 0.00–0.40)
Total Protein: 6.7 g/dL (ref 6.0–8.5)

## 2020-01-23 LAB — HEMOGLOBIN A1C
Est. average glucose Bld gHb Est-mCnc: 111 mg/dL
Hgb A1c MFr Bld: 5.5 % (ref 4.8–5.6)

## 2020-01-25 ENCOUNTER — Telehealth: Payer: Self-pay | Admitting: Cardiology

## 2020-01-25 NOTE — Telephone Encounter (Signed)
New Message ° ° ° ° °Pt is returning call for results  ° ° ° ° °

## 2020-01-28 DIAGNOSIS — D500 Iron deficiency anemia secondary to blood loss (chronic): Secondary | ICD-10-CM | POA: Diagnosis not present

## 2020-01-31 DIAGNOSIS — D500 Iron deficiency anemia secondary to blood loss (chronic): Secondary | ICD-10-CM | POA: Diagnosis not present

## 2020-02-04 DIAGNOSIS — D500 Iron deficiency anemia secondary to blood loss (chronic): Secondary | ICD-10-CM | POA: Diagnosis not present

## 2020-02-04 DIAGNOSIS — K7581 Nonalcoholic steatohepatitis (NASH): Secondary | ICD-10-CM | POA: Diagnosis not present

## 2020-02-05 DIAGNOSIS — D500 Iron deficiency anemia secondary to blood loss (chronic): Secondary | ICD-10-CM | POA: Diagnosis not present

## 2020-02-05 DIAGNOSIS — R0600 Dyspnea, unspecified: Secondary | ICD-10-CM | POA: Diagnosis not present

## 2020-02-05 DIAGNOSIS — R195 Other fecal abnormalities: Secondary | ICD-10-CM | POA: Diagnosis not present

## 2020-02-14 ENCOUNTER — Other Ambulatory Visit: Payer: Self-pay

## 2020-02-14 ENCOUNTER — Ambulatory Visit (INDEPENDENT_AMBULATORY_CARE_PROVIDER_SITE_OTHER): Payer: Medicare Other | Admitting: Cardiology

## 2020-02-14 ENCOUNTER — Encounter: Payer: Self-pay | Admitting: Cardiology

## 2020-02-14 VITALS — BP 80/58 | HR 84 | Temp 97.6°F | Ht 61.0 in | Wt 153.6 lb

## 2020-02-14 DIAGNOSIS — I2510 Atherosclerotic heart disease of native coronary artery without angina pectoris: Secondary | ICD-10-CM | POA: Diagnosis not present

## 2020-02-14 DIAGNOSIS — I10 Essential (primary) hypertension: Secondary | ICD-10-CM | POA: Diagnosis not present

## 2020-02-14 DIAGNOSIS — E782 Mixed hyperlipidemia: Secondary | ICD-10-CM | POA: Diagnosis not present

## 2020-02-14 DIAGNOSIS — E088 Diabetes mellitus due to underlying condition with unspecified complications: Secondary | ICD-10-CM | POA: Diagnosis not present

## 2020-02-14 NOTE — Patient Instructions (Signed)
Medication Instructions:  No changes *If you need a refill on your cardiac medications before your next appointment, please call your pharmacy*   Lab Work: None ordered If you have labs (blood work) drawn today and your tests are completely normal, you will receive your results only by: Marland Kitchen MyChart Message (if you have MyChart) OR . A paper copy in the mail If you have any lab test that is abnormal or we need to change your treatment, we will call you to review the results.   Testing/Procedures: None ordered   Follow-Up: At [facility], you and your health needs are our priority.  As part of our continuing mission to provide you with exceptional heart care, we have created designated Provider Care Teams.  These Care Teams include your primary Cardiologist (physician) and Advanced Practice Providers (APPs -  Physician Assistants and Nurse Practitioners) who all work together to provide you with the care you need, when you need it.  We recommend signing up for the patient portal called "MyChart".  Sign up information is provided on this After Visit Summary.  MyChart is used to connect with patients for Virtual Visits (Telemedicine).  Patients are able to view lab/test results, encounter notes, upcoming appointments, etc.  Non-urgent messages can be sent to your provider as well.   To learn more about what you can do with MyChart, go to [email or web address].    Your next appointment:   3 month(s)  The format for your next appointment:   In Person  Provider:   Jyl Heinz, MD   Other Instructions You have a referral for the valve clinic at [facility]. They will contact to you to schedule an appointment.

## 2020-02-14 NOTE — Progress Notes (Signed)
Cardiology Office Note:    Date:  02/14/2020   ID:  Vanessa Johnson, DOB 1936-05-06, MRN [id number]  PCP:  [name], MD  Cardiologist:  [name], MD   Referring MD: [name], MD    ASSESSMENT:    1. Coronary artery disease involving native coronary artery of native heart without angina pectoris   2. Essential hypertension   3. Diabetes mellitus due to underlying condition with complication, without long-term current use of insulin ([place])   4. Mixed dyslipidemia    PLAN:    In order of problems listed above:  1. Coronary artery disease: Secondary prevention stressed with the patient.  Importance of compliance with diet and medication stressed and she vocalized understanding. 2. Anemia: She has seen her gastroenterologist recently and he has increased iron tablets.  She is taking 2 tablets daily now. 3. Cardiomyopathy with severe mitral regurgitation: This is a challenging and frustrating problem because we cannot do any much more with her medications because her blood pressure is borderline.  I will refer her to her valve clinic experts to see if they can give any suggestions about interventions for her mitral regurgitation.  She has multiple comorbidities and is a frail lady but with medical management our options are limited and she is agreeable.  We will set her up for an appointment with the valve clinic 4. Patient will be seen in follow-up appointment in 3 months or earlier if the patient has any concerns    Medication Adjustments/Labs and Tests Ordered: Current medicines are reviewed at length with the patient today.  Concerns regarding medicines are outlined above.  No orders of the defined types were placed in this encounter.  No orders of the defined types were placed in this encounter.    No chief complaint on file.    History of Present Illness:    Vanessa Johnson is a 84 y.o. female.  Patient has past medical history of coronary artery disease, essential  hypertension dyslipidemia diabetes mellitus and anemia.  Recent echocardiogram has revealed ejection fraction of 35 to 40% with severe mitral regurgitation.  Patient has moderate tricuspid regurgitation.  She gives history of shortness of breath on exertion this has been steady.  We have tried modifying and adjusting her medications but her blood pressure is borderline so we really do not have much room to move with this.  At the time of my evaluation, the patient is alert awake oriented and in no distress.  Past Medical History:  Diagnosis Date  . Allergic rhinitis   . Back pain    left lower back  . Benign essential HTN   . BMI 34.0-34.9,adult   . Bronchial asthma   . Bronchitis 11/20/2018  . CAD (coronary artery disease)    Heart cath 01/2017 [facility]  . Cholelithiasis with cholecystitis without obstruction 12/04/2015  . Diabetes mellitus ([place])   . Diabetes mellitus due to underlying condition with complication, without long-term current use of insulin ([place]) 01/15/2016   Formatting of this note might be different from the original. Added automatically from request for surgery [id number]  . Diabetes mellitus without complication ([place]) [id number]  . Dysfunction of right eustachian tube 11/20/2018  . Essential hypertension 05/17/2017  . Gastritis    Dr. Orlena Sheldon- EGD 2018  . GERD (gastroesophageal reflux disease)   . Hyperlipidemia   . Hypothyroid   . Ischiogluteal bursitis of left side   . Mixed dyslipidemia 05/17/2017  . Obstructive sleep apnea of adult 12/04/2015  .  Pre-ulcerative corn or callous   . Sleep apnea    On CPAP  . Tinea pedis of right foot 01/15/2016  . Unsteadiness 08/16/2018   Last Assessment & Plan:  Emergency department follow-up for evaluation of vertigo. Acute onset of vertigo 08/12/2018.  CT head at that time was okay.  Vertiginous symptoms resolved that day.  She persists with some unsteadiness but it is generally improving.  She has chronic history of unsteady that  generally is helped by meclizine.  Denies any hearing loss symptoms or ringing in the ears. EXAM sh  . Vitamin D deficiency     Past Surgical History:  Procedure Laterality Date  . BREAST BIOPSY  1970, 1980, and 2000  . CATARACT EXTRACTION, BILATERAL  2016  . CHOLECYSTECTOMY  2017  . DILATION AND CURETTAGE OF UTERUS  1996  . [place]  . LEFT HEART CATH AND CORONARY ANGIOGRAPHY  01/2017   [facility]- No intervention  . TONSILLECTOMY  1965  . TOTAL HIP ARTHROPLASTY Bilateral    left- 2005 and right- 2010    Current Medications: Current Meds  Medication Sig  . albuterol (PROVENTIL) (2.5 MG/3ML) 0.083% nebulizer solution Take 2.5 mg by nebulization every 6 (six) hours as needed for wheezing or shortness of breath.   Marland Kitchen aspirin EC 81 MG tablet Take 1 tablet (81 mg total) by mouth daily.  Marland Kitchen b complex vitamins tablet Take 1 tablet by mouth daily.  . calcium carbonate (CALCIUM 600) 600 MG TABS tablet Take 600 mg by mouth daily with breakfast.   . cyclobenzaprine (FLEXERIL) 10 MG tablet Take 10 mg by mouth 3 (three) times daily as needed.   . FEROSUL 325 (65 Fe) MG tablet Take 1 tablet by mouth daily.  . ferrous sulfate 325 (65 FE) MG EC tablet Take 2 tablets by mouth daily.  Marland Kitchen FIBER SELECT GUMMIES CHEW Chew 1 tablet by mouth daily. powder  . fluticasone (FLONASE) 50 MCG/ACT nasal spray USE 2 SPRAYS IN EACH NOSTRIL DAILY  . Fluticasone-Salmeterol (ADVAIR) 250-50 MCG/DOSE AEPB Inhale into the lungs.  . furosemide (LASIX) 20 MG tablet Take 0.5 tablets (10 mg total) by mouth 2 (two) times daily.  . hydrochlorothiazide (HYDRODIURIL) 25 MG tablet   . levalbuterol (XOPENEX) 0.31 MG/3ML nebulizer solution Take 1 ampule by nebulization every 4 (four) hours as needed for wheezing or shortness of breath.   . levothyroxine (SYNTHROID) 88 MCG tablet Take 88 mcg by mouth daily.  . meclizine (ANTIVERT) 25 MG tablet Take 25 mg by mouth every 8 (eight)  hours as needed. Every 8-12 hours as needed  . metFORMIN (GLUCOPHAGE) 500 MG tablet Take 500 mg by mouth 2 (two) times daily.   . metoprolol succinate (TOPROL-XL) 100 MG 24 hr tablet Take 100 mg by mouth daily. Take with or immediately following a meal.   . montelukast (SINGULAIR) 10 MG tablet Take 10 mg by mouth at bedtime.   Marland Kitchen NAMZARIC 14-10 MG CP24 Take 1 capsule by mouth daily.  . nitroGLYCERIN (NITROSTAT) 0.4 MG SL tablet Place 1 tablet (0.4 mg total) under the tongue every 5 (five) minutes as needed for chest pain.  Marland Kitchen nystatin-triamcinolone (MYCOLOG II) cream Apply 1 application topically 2 (two) times daily as needed (irritation).   . pantoprazole (PROTONIX) 40 MG tablet Take 40 mg by mouth daily.  . polyethylene glycol (MIRALAX / GLYCOLAX) packet Take 17 g by mouth daily as needed.   . potassium chloride (K-DUR) 10 MEQ tablet  Take 10 mEq by mouth daily.  . ranolazine (RANEXA) 500 MG 12 hr tablet Take 1 tablet (500 mg total) by mouth 2 (two) times daily.  . Vitamin D, Ergocalciferol, (DRISDOL) 50000 units CAPS capsule Take 50,000 Units by mouth every 7 (seven) days.   . vitamin E 400 UNIT capsule Take 400 Units by mouth daily.      Allergies:   Tussionex pennkinetic er [hydrocod polst-cpm polst er], Cefuroxime axetil, Cephalexin, Codeine, Iohexol, Sulfamethoxazole, Cephalosporins, Hydrocodone-chlorpheniramine, and Moxifloxacin   Social History   Socioeconomic History  . Marital status: Widowed    Spouse name: Not on file  . Number of children: Not on file  . Years of education: Not on file  . Highest education level: Not on file  Occupational History  . Not on file  Tobacco Use  . Smoking status: Former Research scientist (life sciences)  . Smokeless tobacco: Never Used  Substance and Sexual Activity  . Alcohol use: No  . Drug use: No  . Sexual activity: Not on file  Other Topics Concern  . Not on file  Social History Narrative  . Not on file   Social Determinants of Health   Financial Resource  Strain:   . Difficulty of Paying Living Expenses:   Food Insecurity:   . Worried About Charity fundraiser in the Last Year:   . Arboriculturist in the Last Year:   Transportation Needs:   . Film/video editor (Medical):   Marland Kitchen Lack of Transportation (Non-Medical):   Physical Activity:   . Days of Exercise per Week:   . Minutes of Exercise per Session:   Stress:   . Feeling of Stress :   Social Connections:   . Frequency of Communication with Friends and Family:   . Frequency of Social Gatherings with Friends and Family:   . Attends Religious Services:   . Active Member of Clubs or Organizations:   . Attends Archivist Meetings:   Marland Kitchen Marital Status:      Family History: The patient's family history includes Asthma in her brother and sister; Breast cancer in her sister; COPD in her brother and sister; Diabetes in her brother, mother, and sister; Emphysema in her brother, father, and sister; Heart attack in her father and mother; Heart disease in her brother and mother; Hypertension in her father, mother, and sister; Lung cancer in her brother.  ROS:   Please see the history of present illness.    All other systems reviewed and are negative.  EKGs/Labs/Other Studies Reviewed:    The following studies were reviewed today: IMPRESSIONS    1. Left ventricular ejection fraction, by estimation, is 35 to 40%. The  left ventricle has moderately decreased function. The left ventricle  demonstrates global hypokinesis. The left ventricular internal cavity size  was mildly dilated. There is moderate  concentric left ventricular hypertrophy. Left ventricular diastolic  parameters are consistent with Grade III diastolic dysfunction  (restrictive).  2. Right ventricular systolic function is normal. The right ventricular  size is normal. There is moderately elevated pulmonary artery systolic  pressure.  3. Left atrial size was severely dilated.  4. The mitral valve is  normal in structure. Severe mitral valve  regurgitation. No evidence of mitral stenosis. EROA 0.37 cmsq, Regurgitant  Volume 63 ml.  5. Tricuspid valve regurgitation is moderate.  6. The aortic valve is normal in structure. Aortic valve regurgitation is  not visualized. No aortic stenosis is present.  7. The inferior vena  cava is normal in size with greater than 50%  respiratory variability, suggesting right atrial pressure of 3 mmHg.    Recent Labs: 01/14/2020: ALT 15; BUN 21; Creatinine, Ser 1.45; Hemoglobin 9.4; Platelets 408; Potassium 4.4; Sodium 141; TSH 2.420  Recent Lipid Panel    Component Value Date/Time   CHOL 136 06/11/2019 1035   TRIG 175 (H) 06/11/2019 1035   HDL 43 06/11/2019 1035   CHOLHDL 3.2 06/11/2019 1035   LDLCALC 58 06/11/2019 1035    Physical Exam:    VS:  BP (!) 80/58   Pulse 84   Temp 97.6 F (36.4 C)   Ht 5\' 1"  (1.549 m)   Wt 153 lb 9.6 oz (69.7 kg)   SpO2 95%   BMI 29.02 kg/m     Wt Readings from Last 3 Encounters:  02/14/20 153 lb 9.6 oz (69.7 kg)  01/14/20 157 lb (71.2 kg)  01/01/20 158 lb (71.7 kg)     GEN: Patient is in no acute distress HEENT: Normal NECK: No JVD; No carotid bruits LYMPHATICS: No lymphadenopathy CARDIAC: Hear sounds regular, 2/6 systolic murmur at the apex. RESPIRATORY:  Clear to auscultation without rales, wheezing or rhonchi  ABDOMEN: Soft, non-tender, non-distended MUSCULOSKELETAL:  No edema; No deformity  SKIN: Warm and dry NEUROLOGIC:  Alert and oriented x 3 PSYCHIATRIC:  Normal affect   Signed, [name], MD  02/14/2020 10:33 AM    [place]

## 2020-02-18 ENCOUNTER — Other Ambulatory Visit: Payer: Self-pay

## 2020-02-18 ENCOUNTER — Encounter: Payer: Self-pay | Admitting: Cardiovascular Disease

## 2020-02-18 ENCOUNTER — Ambulatory Visit (INDEPENDENT_AMBULATORY_CARE_PROVIDER_SITE_OTHER): Payer: Medicare Other | Admitting: Cardiovascular Disease

## 2020-02-18 VITALS — BP 104/60 | HR 84 | Ht 61.0 in | Wt 153.8 lb

## 2020-02-18 DIAGNOSIS — I340 Nonrheumatic mitral (valve) insufficiency: Secondary | ICD-10-CM

## 2020-02-18 DIAGNOSIS — I2510 Atherosclerotic heart disease of native coronary artery without angina pectoris: Secondary | ICD-10-CM

## 2020-02-18 NOTE — Progress Notes (Signed)
Cardiology Office Note:    Date:  02/20/2020   ID:  Vanessa Johnson, DOB Mar 12, 1936, MRN [id number]  PCP:  [name], MD  Cardiologist:  [name], MD  Electrophysiologist:  None   Referring MD: [name], MD   Chief Complaint  Patient presents with  . Shortness of Breath    History of Present Illness:    Vanessa Johnson is a 84 y.o. female referred by Dr Geraldo Pitter for evaluation of severe mitral regurgitation.   She is here with her son-in-law today. She has had problems with exertional dyspnea for some time, but became acutely more short of breath in December 2020. She was ultimately hospitalized at [facility] with acute pulmonary edema/CHF and treated with IV diuretic Rx with modest improvement.   She has been followed by Dr Geraldo Pitter for several years. In March 2020 she had been noted to have worsening angina and was tentatively set up for heart catheterization. However, preop labs showed significant anemia. After treating her anemia, anginal symptoms improved. She ultimately underwent cardiac catheterization during her hospitalization in [facility], demonstrating moderate left main disease with IVUS characteristics suggesting non-significant stenosis. Further records from her hospitalization are not currently available.   The patient lives alone. She has been widowed since 2005. She has good support from her family. The patient is markedly limited by fatigue and shortness of breath. She denies chest pain or pressure. She does have orthopnea when she first lays down, no PND. She has felt upper abdominal swelling, but denies leg edema recently. Reports occasional lightheadedness, no syncope. She is increasingly sedentary because of limitations related to dyspnea.   Past Medical History:  Diagnosis Date  . Allergic rhinitis   . Back pain    left lower back  . Benign essential HTN   . BMI 34.0-34.9,adult   . Bronchial asthma   . Bronchitis 11/20/2018    . CAD (coronary artery disease)    Heart cath 01/2017 [facility]  . Cholelithiasis with cholecystitis without obstruction 12/04/2015  . Diabetes mellitus ([place])   . Diabetes mellitus due to underlying condition with complication, without long-term current use of insulin ([place]) 01/15/2016   Formatting of this note might be different from the original. Added automatically from request for surgery [id number]  . Diabetes mellitus without complication ([place]) [id number]  . Dysfunction of right eustachian tube 11/20/2018  . Essential hypertension 05/17/2017  . Gastritis    Dr. Orlena Sheldon- EGD 2018  . GERD (gastroesophageal reflux disease)   . Hyperlipidemia   . Hypothyroid   . Ischiogluteal bursitis of left side   . Mixed dyslipidemia 05/17/2017  . Obstructive sleep apnea of adult 12/04/2015  . Pre-ulcerative corn or callous   . Sleep apnea    On CPAP  . Tinea pedis of right foot 01/15/2016  . Unsteadiness 08/16/2018   Last Assessment & Plan:  Emergency department follow-up for evaluation of vertigo. Acute onset of vertigo 08/12/2018.  CT head at that time was okay.  Vertiginous symptoms resolved that day.  She persists with some unsteadiness but it is generally improving.  She has chronic history of unsteady that generally is helped by meclizine.  Denies any hearing loss symptoms or ringing in the ears. EXAM sh  . Vitamin D deficiency     Past Surgical History:  Procedure Laterality Date  . BREAST BIOPSY  1970, 1980, and 2000  . CATARACT EXTRACTION, BILATERAL  2016  . CHOLECYSTECTOMY  2017  . DILATION AND CURETTAGE  OF UTERUS  1996  . [place]  . LEFT HEART CATH AND CORONARY ANGIOGRAPHY  01/2017   [facility]- No intervention  . TONSILLECTOMY  1965  . TOTAL HIP ARTHROPLASTY Bilateral    left- 2005 and right- 2010    Current Medications: Current Meds  Medication Sig  . albuterol (PROVENTIL) (2.5 MG/3ML) 0.083% nebulizer solution Take 2.5 mg by  nebulization every 6 (six) hours as needed for wheezing or shortness of breath.   Marland Kitchen aspirin EC 81 MG tablet Take 1 tablet (81 mg total) by mouth daily.  Marland Kitchen b complex vitamins tablet Take 1 tablet by mouth daily.  . calcium carbonate (CALCIUM 600) 600 MG TABS tablet Take 600 mg by mouth daily with breakfast.   . cyclobenzaprine (FLEXERIL) 10 MG tablet Take 10 mg by mouth 3 (three) times daily as needed.   . FEROSUL 325 (65 Fe) MG tablet Take 1 tablet by mouth daily.  . ferrous sulfate 325 (65 FE) MG EC tablet Take 2 tablets by mouth daily.  Marland Kitchen FIBER SELECT GUMMIES CHEW Chew 1 tablet by mouth daily. powder  . fluticasone (FLONASE) 50 MCG/ACT nasal spray USE 2 SPRAYS IN EACH NOSTRIL DAILY  . Fluticasone-Salmeterol (ADVAIR) 250-50 MCG/DOSE AEPB Inhale into the lungs.  . furosemide (LASIX) 20 MG tablet Take 0.5 tablets (10 mg total) by mouth 2 (two) times daily.  . hydrochlorothiazide (HYDRODIURIL) 25 MG tablet   . levalbuterol (XOPENEX) 0.31 MG/3ML nebulizer solution Take 1 ampule by nebulization every 4 (four) hours as needed for wheezing or shortness of breath.   . levothyroxine (SYNTHROID) 88 MCG tablet Take 88 mcg by mouth daily.  . meclizine (ANTIVERT) 25 MG tablet Take 25 mg by mouth every 8 (eight) hours as needed. Every 8-12 hours as needed  . metFORMIN (GLUCOPHAGE) 500 MG tablet Take 500 mg by mouth 2 (two) times daily.   . metoprolol succinate (TOPROL-XL) 100 MG 24 hr tablet Take 100 mg by mouth daily. Take with or immediately following a meal.   . montelukast (SINGULAIR) 10 MG tablet Take 10 mg by mouth at bedtime.   Marland Kitchen NAMZARIC 14-10 MG CP24 Take 1 capsule by mouth daily.  . nitroGLYCERIN (NITROSTAT) 0.4 MG SL tablet Place 1 tablet (0.4 mg total) under the tongue every 5 (five) minutes as needed for chest pain.  Marland Kitchen nystatin-triamcinolone (MYCOLOG II) cream Apply 1 application topically 2 (two) times daily as needed (irritation).   . pantoprazole (PROTONIX) 40 MG tablet Take 40 mg by mouth  daily.  . polyethylene glycol (MIRALAX / GLYCOLAX) packet Take 17 g by mouth daily as needed.   . potassium chloride (K-DUR) 10 MEQ tablet Take 10 mEq by mouth daily.  . ranolazine (RANEXA) 500 MG 12 hr tablet Take 1 tablet (500 mg total) by mouth 2 (two) times daily.  . Vitamin D, Ergocalciferol, (DRISDOL) 50000 units CAPS capsule Take 50,000 Units by mouth every 7 (seven) days.   . vitamin E 400 UNIT capsule Take 400 Units by mouth daily.      Allergies:   Tussionex pennkinetic er [hydrocod polst-cpm polst er], Cefuroxime axetil, Cephalexin, Codeine, Iohexol, Sulfamethoxazole, Cephalosporins, Hydrocodone-chlorpheniramine, and Moxifloxacin   Social History   Socioeconomic History  . Marital status: Widowed    Spouse name: Not on file  . Number of children: Not on file  . Years of education: Not on file  . Highest education level: Not on file  Occupational History  . Not on file  Tobacco Use  . Smoking status: Former Research scientist (life sciences)  . Smokeless tobacco: Never Used  Substance and Sexual Activity  . Alcohol use: No  . Drug use: No  . Sexual activity: Not on file  Other Topics Concern  . Not on file  Social History Narrative  . Not on file   Social Determinants of Health   Financial Resource Strain:   . Difficulty of Paying Living Expenses:   Food Insecurity:   . Worried About Charity fundraiser in the Last Year:   . Arboriculturist in the Last Year:   Transportation Needs:   . Film/video editor (Medical):   Marland Kitchen Lack of Transportation (Non-Medical):   Physical Activity:   . Days of Exercise per Week:   . Minutes of Exercise per Session:   Stress:   . Feeling of Stress :   Social Connections:   . Frequency of Communication with Friends and Family:   . Frequency of Social Gatherings with Friends and Family:   . Attends Religious Services:   . Active Member of Clubs or Organizations:   . Attends Archivist Meetings:   Marland Kitchen Marital Status:      Family History: The  patient's family history includes Asthma in her brother and sister; Breast cancer in her sister; COPD in her brother and sister; Diabetes in her brother, mother, and sister; Emphysema in her brother, father, and sister; Heart attack in her father and mother; Heart disease in her brother and mother; Hypertension in her father, mother, and sister; Lung cancer in her brother.  ROS:   Please see the history of present illness.    All other systems reviewed and are negative.  EKGs/Labs/Other Studies Reviewed:    The following studies were reviewed today: Cardiac Cath: Cardiac Arteries and Lesion Findings LMCA: Abnormal and Focal stenosis.  Lesion on LMCA: Ostial.40% stenosis 3 mm length . Pre procedure TIMI III  flow was noted. Good run off was present.The lesion was eccentric and  lightly calcified.  Comments:IVUS of ostial LM: somewhat skewed images however diagnostic.  moderate eccentric calcific plaque, luminal area >6mm2 LAD: Normal. LCx: Diffuse irregularity. RCA: Single stenosis.  Lesion on Prox RCA: Mid subsection.50% stenosis. Pre procedure TIMI III  flow was noted. Good run off was present. The lesion was diagnosed as Low  Risk (A). The lesion was tubular and lightly calcified.The lesion showed  evidence of not present thrombus and with irregular contour.  Echo (10/20/2019): Summary Severely reduced LV systolic function. Ejection fraction is visually estimated at 30-35% Moderate global LV hypokinesis with LV septal dysynchrony. Abnormal LV diastolic function. Normal right ventricular size and function. Mildly dilated left atrium. Mild-to-moderate mitral regurgitation. Est. PA systolic function 35 mmHg. In comparison to the echo from [id number], LV systolic function has declined.  Echo 01/08/2020: IMPRESSIONS    1. Left ventricular ejection fraction, by estimation, is 35 to 40%. The  left ventricle has moderately decreased function. The left ventricle  demonstrates  global hypokinesis. The left ventricular internal cavity size  was mildly dilated. There is moderate  concentric left ventricular hypertrophy. Left ventricular diastolic  parameters are consistent with Grade III diastolic dysfunction  (restrictive).  2. Right ventricular systolic function is normal. The right ventricular  size is normal. There is moderately elevated pulmonary artery systolic  pressure.  3. Left atrial size was severely dilated.  4. The mitral valve is normal in structure. Severe mitral valve  regurgitation. No evidence of mitral stenosis. EROA  0.37 cmsq, Regurgitant  Volume 63 ml.  5. Tricuspid valve regurgitation is moderate.  6. The aortic valve is normal in structure. Aortic valve regurgitation is  not visualized. No aortic stenosis is present.  7. The inferior vena cava is normal in size with greater than 50%  respiratory variability, suggesting right atrial pressure of 3 mmHg.   FINDINGS  Left Ventricle: Left ventricular ejection fraction, by estimation, is 35  to 40%. The left ventricle has moderately decreased function. The left  ventricle demonstrates global hypokinesis. The left ventricular internal  cavity size was mildly dilated.  There is moderate concentric left ventricular hypertrophy. Left  ventricular diastolic parameters are consistent with Grade III diastolic  dysfunction (restrictive).   Right Ventricle: The right ventricular size is normal. No increase in  right ventricular wall thickness. Right ventricular systolic function is  normal. There is moderately elevated pulmonary artery systolic pressure.  The tricuspid regurgitant velocity is  3.62 m/s, and with an assumed right atrial pressure of 3 mmHg, the  estimated right ventricular systolic pressure is 123XX123 mmHg.   Left Atrium: Left atrial size was severely dilated.   Right Atrium: Right atrial size was normal in size.   Pericardium: There is no evidence of pericardial effusion.    Mitral Valve: The mitral valve is normal in structure. Normal mobility of  the mitral valve leaflets. Mild mitral annular calcification. Severe  mitral valve regurgitation. No evidence of mitral valve stenosis.   Tricuspid Valve: The tricuspid valve is normal in structure. Tricuspid  valve regurgitation is moderate . No evidence of tricuspid stenosis.   Aortic Valve: The aortic valve is normal in structure.. There is mild  thickening and mild calcification of the aortic valve. Aortic valve  regurgitation is not visualized. No aortic stenosis is present. Mild  aortic valve annular calcification. There is  mild thickening of the aortic valve. There is mild calcification of the  aortic valve.   Pulmonic Valve: The pulmonic valve was normal in structure. Pulmonic valve  regurgitation is not visualized. No evidence of pulmonic stenosis.   Aorta: The aortic root is normal in size and structure.   Venous: The inferior vena cava is normal in size with greater than 50%  respiratory variability, suggesting right atrial pressure of 3 mmHg.   IAS/Shunts: No atrial level shunt detected by color flow Doppler.     LEFT VENTRICLE  PLAX 2D  LVIDd:     5.30 cm   Diastology  LVIDs:     4.70 cm   LV e' lateral:  6.05 cm/s  LV PW:     1.30 cm   LV E/e' lateral: 21.8  LV IVS:    1.40 cm   LV e' medial:  3.78 cm/s  LVOT diam:   1.90 cm   LV E/e' medial: 34.9  LV SV:     54  LV SV Index:  32      2D Longitudinal Strain  LVOT Area:   2.84 cm   2D Strain GLS Avg:   -7.5 %    LV Volumes (MOD)  LV vol d, MOD A2C: 105.0 ml  LV vol d, MOD A4C: 72.9 ml  LV vol s, MOD A2C: 61.9 ml  LV vol s, MOD A4C: 50.0 ml  LV SV MOD A2C:   43.1 ml  LV SV MOD A4C:   72.9 ml  LV SV MOD BP:   37.4 ml   RIGHT VENTRICLE      IVC  RV  S prime:   7.51 cm/s IVC diam: 1.80 cm  TAPSE (M-mode): 1.8 cm   LEFT ATRIUM       Index    RIGHT  ATRIUM      Index  LA diam:    4.90 cm 2.87 cm/m RA Area:   11.90 cm  LA Vol (A2C):  100.0 ml 58.52 ml/m RA Volume:  25.10 ml 14.69 ml/m  LA Vol (A4C):  80.9 ml 47.35 ml/m  LA Biplane Vol: 93.5 ml 54.72 ml/m  AORTIC VALVE  LVOT Vmax:  88.10 cm/s  LVOT Vmean: 69.000 cm/s  LVOT VTI:  0.191 m    AORTA  Ao Root diam: 3.00 cm  Ao Asc diam: 3.60 cm   MITRAL VALVE         TRICUSPID VALVE  MV Area (PHT): 5.84 cm   TR Peak grad:  52.4 mmHg  MV Decel Time: 130 msec   TR Vmax:    362.00 cm/s  MR Peak grad:  111.9 mmHg  MR Mean grad:  62.0 mmHg  SHUNTS  MR Vmax:     529.00 cm/s Systemic VTI: 0.19 m  MR Vmean:    362.0 cm/s Systemic Diam: 1.90 cm  MR PISA:     4.54 cm  MR PISA Eff ROA: 33 mm  MR PISA Radius: 0.85 cm  MV E velocity: 132.00 cm/s  MV A velocity: 60.80 cm/s  MV E/A ratio: 2.17    Recent Labs: 01/14/2020: ALT 15; TSH 2.420 02/18/2020: BUN 19; Creatinine, Ser 1.16; Hemoglobin 10.4; Platelets 467; Potassium 4.1; Sodium 141  Recent Lipid Panel    Component Value Date/Time   CHOL 136 06/11/2019 1035   TRIG 175 (H) 06/11/2019 1035   HDL 43 06/11/2019 1035   CHOLHDL 3.2 06/11/2019 1035   LDLCALC 58 06/11/2019 1035    Physical Exam:    VS:  BP 104/60   Pulse 84   Ht 5\' 1"  (1.549 m)   Wt 153 lb 12.8 oz (69.8 kg)   BMI 29.06 kg/m     Wt Readings from Last 3 Encounters:  02/18/20 153 lb 12.8 oz (69.8 kg)  02/14/20 153 lb 9.6 oz (69.7 kg)  01/14/20 157 lb (71.2 kg)     GEN:  Elderly woman in no acute distress HEENT: Normal NECK: No JVD; No carotid bruits LYMPHATICS: No lymphadenopathy CARDIAC: RRR, 2/6 holosystolic murmur only heard at the axilla RESPIRATORY:  Clear to auscultation without rales, wheezing or rhonchi  ABDOMEN: Soft, non-tender, non-distended MUSCULOSKELETAL:  No edema; No deformity  SKIN: Warm and dry NEUROLOGIC:  Alert and oriented x 3 PSYCHIATRIC:  Normal affect   STS  Risk Calculator: MItral Valve Repair - 4.307% Mitral Valve Replacement - 6.343%  ASSESSMENT:    1. Nonrheumatic mitral valve regurgitation    PLAN:    In order of problems listed above:  84 yo woman with severe non-rheumatic mitral regurgitation and NYHA functional Class 3 symptoms of acute on chronic systolic heart failure. She appears to have non-ischemic cardiomyopathy. All available data is reviewed. Will send for further records from her hospitalization at [facility] and try to obtain cardiac catheterization films. However, it appears she does not have flow obstructive CAD based on review of cath notes. I have personally reviewed her recent echocardiogram which demonstrates moderately severe LV dysfunction with severe central mitral regurgitation, EROA of 0.37. Her LV size is mildly dilated. Titration of medical therapy has been very limited by her low BP, frequently with systolic BP's  ranging in the 80's and 90's.   I have reviewed the natural history of mitral regurgitation with the patient and their family members who are present today. We have discussed the limitations of medical therapy and the poor prognosis associated with symptomatic mitral regurgitation. We have also reviewed potential treatment options, including palliative medical therapy, conventional surgical mitral valve repair or replacement, and percutaneous mitral valve therapies such as edge-to-edge mitral valve approximation with MitraClip. We discussed treatment options in the context of this patient's specific comorbid medical conditions.   As a next step in her evaluation, I have recommended transesophageal echo to define the functional anatomy of her mitral valve and determine whether she might be a candidate for transcatheter edge-to-edge mitral valve repair. I have reviewed risks, indications, and alternative to TEE with the patient and her son-in-law today. I will follow-up with her after the TEE. If she is  found to have secondary MR related to her cardiomyopathy, I will refer her for formal heart failure consultation as part of a multidisciplinary approach to her care. However, if she is shown to have primary MR related to leaflet pathology, I will refer her for formal cardiac surgical consultation.   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 It is felt that CRT could be considered if other therapies are not effective, but she would not be predicted to have a high likelihood of benefit. I reviewed the MitraClip procedure in detail with the patient today, and she understands this may be a treatment option pending further multidisciplinary review following TEE.    Medication Adjustments/Labs and Tests Ordered: Current medicines are reviewed at length with the patient today.  Concerns regarding medicines are outlined above.  Orders Placed This Encounter  Procedures  . Basic metabolic panel  . CBC with Differential/Platelet  . EKG 12-Lead   No orders of the defined types were placed in this encounter.   Patient Instructions  COVID SCREENING INFORMATION (4/27): You are scheduled for your drive-thru COVID screening on 4/27 at 1:45PM. [place] [facility]) [address] Stay in the RIGHT lane and proceed under the brick awning and tell them you are there for pre-procedure testing. Do NOT bring any pets with you to the testing site. You will need to go home after your screening and quarantine until your procedure.   PROCEDURE INSTRUCTIONS (4/29): You are scheduled for a TEE on Thursday, 4/29 with Dr. Ena Dawley.  Please arrive at the [facility] (Main Entrance A) at [facility]: [address] [place], [place] [address] at 1:00PM.  DIET: Nothing to eat or drink after midnight except a sip of water with medications (see medication instructions below)  Medication Instructions: 1) HOLD LASIX AND  HYDROCHLOROTHIAZIDE the morning of your procedure 2) HOLD METFORMIN until you can eat  3) Take your other medications as directed with sips of water  Labs: TODAY! BMET, CBC  You must have a responsible person to drive you home and stay in the waiting area during your procedure. Failure to do so could result in cancellation.  Bring your insurance cards.  *Special Note: Every effort is made to have your procedure done on time. Occasionally there are emergencies that occur at the hospital that may cause delays. Please be patient if a delay does occur.      Signed, Sherren Mocha, MD  02/20/2020 6:59 AM    [facility]

## 2020-02-18 NOTE — Patient Instructions (Addendum)
COVID SCREENING INFORMATION (4/27): You are scheduled for your drive-thru COVID screening on 4/27 at 1:45PM. [place] [facility] ([facility]) [address] Stay in the RIGHT lane and proceed under the brick awning and tell them you are there for pre-procedure testing. Do NOT bring any pets with you to the testing site. You will need to go home after your screening and quarantine until your procedure.   PROCEDURE INSTRUCTIONS (4/29): You are scheduled for a TEE on Thursday, 4/29 with Dr. Ena Dawley.  Please arrive at the [facility] (Main Entrance A) at [facility]: [address] [place], [place] [address] at 1:00PM.  DIET: Nothing to eat or drink after midnight except a sip of water with medications (see medication instructions below)  Medication Instructions: 1) HOLD LASIX AND HYDROCHLOROTHIAZIDE the morning of your procedure 2) HOLD METFORMIN until you can eat  3) Take your other medications as directed with sips of water  Labs: TODAY! BMET, CBC  You must have a responsible person to drive you home and stay in the waiting area during your procedure. Failure to do so could result in cancellation.  Bring your insurance cards.  *Special Note: Every effort is made to have your procedure done on time. Occasionally there are emergencies that occur at the hospital that may cause delays. Please be patient if a delay does occur.

## 2020-02-18 NOTE — H&P (View-Only) (Signed)
Cardiology Office Note:    Date:  02/20/2020   ID:  Vanessa Johnson, DOB July 17, 1936, MRN [id number]  PCP:  [name], MD  Cardiologist:  [name], MD  Electrophysiologist:  None   Referring MD: [name], MD   Chief Complaint  Patient presents with  . Shortness of Breath    History of Present Illness:    Vanessa Johnson is a 84 y.o. female referred by Dr Geraldo Pitter for evaluation of severe mitral regurgitation.   She is here with her son-in-law today. She has had problems with exertional dyspnea for some time, but became acutely more short of breath in December 2020. She was ultimately hospitalized at [facility] with acute pulmonary edema/CHF and treated with IV diuretic Rx with modest improvement.   She has been followed by Dr Geraldo Pitter for several years. In March 2020 she had been noted to have worsening angina and was tentatively set up for heart catheterization. However, preop labs showed significant anemia. After treating her anemia, anginal symptoms improved. She ultimately underwent cardiac catheterization during her hospitalization in [facility], demonstrating moderate left main disease with IVUS characteristics suggesting non-significant stenosis. Further records from her hospitalization are not currently available.   The patient lives alone. She has been widowed since 2005. She has good support from her family. The patient is markedly limited by fatigue and shortness of breath. She denies chest pain or pressure. She does have orthopnea when she first lays down, no PND. She has felt upper abdominal swelling, but denies leg edema recently. Reports occasional lightheadedness, no syncope. She is increasingly sedentary because of limitations related to dyspnea.   Past Medical History:  Diagnosis Date  . Allergic rhinitis   . Back pain    left lower back  . Benign essential HTN   . BMI 34.0-34.9,adult   . Bronchial asthma   . Bronchitis 11/20/2018    . CAD (coronary artery disease)    Heart cath 01/2017 [facility]  . Cholelithiasis with cholecystitis without obstruction 12/04/2015  . Diabetes mellitus ([place])   . Diabetes mellitus due to underlying condition with complication, without long-term current use of insulin ([place]) 01/15/2016   Formatting of this note might be different from the original. Added automatically from request for surgery [id number]  . Diabetes mellitus without complication ([place]) [id number]  . Dysfunction of right eustachian tube 11/20/2018  . Essential hypertension 05/17/2017  . Gastritis    Dr. Orlena Sheldon- EGD 2018  . GERD (gastroesophageal reflux disease)   . Hyperlipidemia   . Hypothyroid   . Ischiogluteal bursitis of left side   . Mixed dyslipidemia 05/17/2017  . Obstructive sleep apnea of adult 12/04/2015  . Pre-ulcerative corn or callous   . Sleep apnea    On CPAP  . Tinea pedis of right foot 01/15/2016  . Unsteadiness 08/16/2018   Last Assessment & Plan:  Emergency department follow-up for evaluation of vertigo. Acute onset of vertigo 08/12/2018.  CT head at that time was okay.  Vertiginous symptoms resolved that day.  She persists with some unsteadiness but it is generally improving.  She has chronic history of unsteady that generally is helped by meclizine.  Denies any hearing loss symptoms or ringing in the ears. EXAM sh  . Vitamin D deficiency     Past Surgical History:  Procedure Laterality Date  . BREAST BIOPSY  1970, 1980, and 2000  . CATARACT EXTRACTION, BILATERAL  2016  . CHOLECYSTECTOMY  2017  . DILATION AND CURETTAGE  OF UTERUS  1996  . [place]  . LEFT HEART CATH AND CORONARY ANGIOGRAPHY  01/2017   [facility]- No intervention  . TONSILLECTOMY  1965  . TOTAL HIP ARTHROPLASTY Bilateral    left- 2005 and right- 2010    Current Medications: Current Meds  Medication Sig  . albuterol (PROVENTIL) (2.5 MG/3ML) 0.083% nebulizer solution Take 2.5 mg by  nebulization every 6 (six) hours as needed for wheezing or shortness of breath.   Marland Kitchen aspirin EC 81 MG tablet Take 1 tablet (81 mg total) by mouth daily.  Marland Kitchen b complex vitamins tablet Take 1 tablet by mouth daily.  . calcium carbonate (CALCIUM 600) 600 MG TABS tablet Take 600 mg by mouth daily with breakfast.   . cyclobenzaprine (FLEXERIL) 10 MG tablet Take 10 mg by mouth 3 (three) times daily as needed.   . FEROSUL 325 (65 Fe) MG tablet Take 1 tablet by mouth daily.  . ferrous sulfate 325 (65 FE) MG EC tablet Take 2 tablets by mouth daily.  Marland Kitchen FIBER SELECT GUMMIES CHEW Chew 1 tablet by mouth daily. powder  . fluticasone (FLONASE) 50 MCG/ACT nasal spray USE 2 SPRAYS IN EACH NOSTRIL DAILY  . Fluticasone-Salmeterol (ADVAIR) 250-50 MCG/DOSE AEPB Inhale into the lungs.  . furosemide (LASIX) 20 MG tablet Take 0.5 tablets (10 mg total) by mouth 2 (two) times daily.  . hydrochlorothiazide (HYDRODIURIL) 25 MG tablet   . levalbuterol (XOPENEX) 0.31 MG/3ML nebulizer solution Take 1 ampule by nebulization every 4 (four) hours as needed for wheezing or shortness of breath.   . levothyroxine (SYNTHROID) 88 MCG tablet Take 88 mcg by mouth daily.  . meclizine (ANTIVERT) 25 MG tablet Take 25 mg by mouth every 8 (eight) hours as needed. Every 8-12 hours as needed  . metFORMIN (GLUCOPHAGE) 500 MG tablet Take 500 mg by mouth 2 (two) times daily.   . metoprolol succinate (TOPROL-XL) 100 MG 24 hr tablet Take 100 mg by mouth daily. Take with or immediately following a meal.   . montelukast (SINGULAIR) 10 MG tablet Take 10 mg by mouth at bedtime.   Marland Kitchen NAMZARIC 14-10 MG CP24 Take 1 capsule by mouth daily.  . nitroGLYCERIN (NITROSTAT) 0.4 MG SL tablet Place 1 tablet (0.4 mg total) under the tongue every 5 (five) minutes as needed for chest pain.  Marland Kitchen nystatin-triamcinolone (MYCOLOG II) cream Apply 1 application topically 2 (two) times daily as needed (irritation).   . pantoprazole (PROTONIX) 40 MG tablet Take 40 mg by mouth  daily.  . polyethylene glycol (MIRALAX / GLYCOLAX) packet Take 17 g by mouth daily as needed.   . potassium chloride (K-DUR) 10 MEQ tablet Take 10 mEq by mouth daily.  . ranolazine (RANEXA) 500 MG 12 hr tablet Take 1 tablet (500 mg total) by mouth 2 (two) times daily.  . Vitamin D, Ergocalciferol, (DRISDOL) 50000 units CAPS capsule Take 50,000 Units by mouth every 7 (seven) days.   . vitamin E 400 UNIT capsule Take 400 Units by mouth daily.      Allergies:   Tussionex pennkinetic er [hydrocod polst-cpm polst er], Cefuroxime axetil, Cephalexin, Codeine, Iohexol, Sulfamethoxazole, Cephalosporins, Hydrocodone-chlorpheniramine, and Moxifloxacin   Social History   Socioeconomic History  . Marital status: Widowed    Spouse name: Not on file  . Number of children: Not on file  . Years of education: Not on file  . Highest education level: Not on file  Occupational History  . Not on file  Tobacco Use  . Smoking status: Former Research scientist (life sciences)  . Smokeless tobacco: Never Used  Substance and Sexual Activity  . Alcohol use: No  . Drug use: No  . Sexual activity: Not on file  Other Topics Concern  . Not on file  Social History Narrative  . Not on file   Social Determinants of Health   Financial Resource Strain:   . Difficulty of Paying Living Expenses:   Food Insecurity:   . Worried About Charity fundraiser in the Last Year:   . Arboriculturist in the Last Year:   Transportation Needs:   . Film/video editor (Medical):   Marland Kitchen Lack of Transportation (Non-Medical):   Physical Activity:   . Days of Exercise per Week:   . Minutes of Exercise per Session:   Stress:   . Feeling of Stress :   Social Connections:   . Frequency of Communication with Friends and Family:   . Frequency of Social Gatherings with Friends and Family:   . Attends Religious Services:   . Active Member of Clubs or Organizations:   . Attends Archivist Meetings:   Marland Kitchen Marital Status:      Family History: The  patient's family history includes Asthma in her brother and sister; Breast cancer in her sister; COPD in her brother and sister; Diabetes in her brother, mother, and sister; Emphysema in her brother, father, and sister; Heart attack in her father and mother; Heart disease in her brother and mother; Hypertension in her father, mother, and sister; Lung cancer in her brother.  ROS:   Please see the history of present illness.    All other systems reviewed and are negative.  EKGs/Labs/Other Studies Reviewed:    The following studies were reviewed today: Cardiac Cath: Cardiac Arteries and Lesion Findings LMCA: Abnormal and Focal stenosis.  Lesion on LMCA: Ostial.40% stenosis 3 mm length . Pre procedure TIMI III  flow was noted. Good run off was present.The lesion was eccentric and  lightly calcified.  Comments:IVUS of ostial LM: somewhat skewed images however diagnostic.  moderate eccentric calcific plaque, luminal area >74mm2 LAD: Normal. LCx: Diffuse irregularity. RCA: Single stenosis.  Lesion on Prox RCA: Mid subsection.50% stenosis. Pre procedure TIMI III  flow was noted. Good run off was present. The lesion was diagnosed as Low  Risk (A). The lesion was tubular and lightly calcified.The lesion showed  evidence of not present thrombus and with irregular contour.  Echo (10/20/2019): Summary Severely reduced LV systolic function. Ejection fraction is visually estimated at 30-35% Moderate global LV hypokinesis with LV septal dysynchrony. Abnormal LV diastolic function. Normal right ventricular size and function. Mildly dilated left atrium. Mild-to-moderate mitral regurgitation. Est. PA systolic function 35 mmHg. In comparison to the echo from [id number], LV systolic function has declined.  Echo 01/08/2020: IMPRESSIONS    1. Left ventricular ejection fraction, by estimation, is 35 to 40%. The  left ventricle has moderately decreased function. The left ventricle  demonstrates  global hypokinesis. The left ventricular internal cavity size  was mildly dilated. There is moderate  concentric left ventricular hypertrophy. Left ventricular diastolic  parameters are consistent with Grade III diastolic dysfunction  (restrictive).  2. Right ventricular systolic function is normal. The right ventricular  size is normal. There is moderately elevated pulmonary artery systolic  pressure.  3. Left atrial size was severely dilated.  4. The mitral valve is normal in structure. Severe mitral valve  regurgitation. No evidence of mitral stenosis. EROA  0.37 cmsq, Regurgitant  Volume 63 ml.  5. Tricuspid valve regurgitation is moderate.  6. The aortic valve is normal in structure. Aortic valve regurgitation is  not visualized. No aortic stenosis is present.  7. The inferior vena cava is normal in size with greater than 50%  respiratory variability, suggesting right atrial pressure of 3 mmHg.   FINDINGS  Left Ventricle: Left ventricular ejection fraction, by estimation, is 35  to 40%. The left ventricle has moderately decreased function. The left  ventricle demonstrates global hypokinesis. The left ventricular internal  cavity size was mildly dilated.  There is moderate concentric left ventricular hypertrophy. Left  ventricular diastolic parameters are consistent with Grade III diastolic  dysfunction (restrictive).   Right Ventricle: The right ventricular size is normal. No increase in  right ventricular wall thickness. Right ventricular systolic function is  normal. There is moderately elevated pulmonary artery systolic pressure.  The tricuspid regurgitant velocity is  3.62 m/s, and with an assumed right atrial pressure of 3 mmHg, the  estimated right ventricular systolic pressure is 123XX123 mmHg.   Left Atrium: Left atrial size was severely dilated.   Right Atrium: Right atrial size was normal in size.   Pericardium: There is no evidence of pericardial effusion.    Mitral Valve: The mitral valve is normal in structure. Normal mobility of  the mitral valve leaflets. Mild mitral annular calcification. Severe  mitral valve regurgitation. No evidence of mitral valve stenosis.   Tricuspid Valve: The tricuspid valve is normal in structure. Tricuspid  valve regurgitation is moderate . No evidence of tricuspid stenosis.   Aortic Valve: The aortic valve is normal in structure.. There is mild  thickening and mild calcification of the aortic valve. Aortic valve  regurgitation is not visualized. No aortic stenosis is present. Mild  aortic valve annular calcification. There is  mild thickening of the aortic valve. There is mild calcification of the  aortic valve.   Pulmonic Valve: The pulmonic valve was normal in structure. Pulmonic valve  regurgitation is not visualized. No evidence of pulmonic stenosis.   Aorta: The aortic root is normal in size and structure.   Venous: The inferior vena cava is normal in size with greater than 50%  respiratory variability, suggesting right atrial pressure of 3 mmHg.   IAS/Shunts: No atrial level shunt detected by color flow Doppler.     LEFT VENTRICLE  PLAX 2D  LVIDd:     5.30 cm   Diastology  LVIDs:     4.70 cm   LV e' lateral:  6.05 cm/s  LV PW:     1.30 cm   LV E/e' lateral: 21.8  LV IVS:    1.40 cm   LV e' medial:  3.78 cm/s  LVOT diam:   1.90 cm   LV E/e' medial: 34.9  LV SV:     54  LV SV Index:  32      2D Longitudinal Strain  LVOT Area:   2.84 cm   2D Strain GLS Avg:   -7.5 %    LV Volumes (MOD)  LV vol d, MOD A2C: 105.0 ml  LV vol d, MOD A4C: 72.9 ml  LV vol s, MOD A2C: 61.9 ml  LV vol s, MOD A4C: 50.0 ml  LV SV MOD A2C:   43.1 ml  LV SV MOD A4C:   72.9 ml  LV SV MOD BP:   37.4 ml   RIGHT VENTRICLE      IVC  RV  S prime:   7.51 cm/s IVC diam: 1.80 cm  TAPSE (M-mode): 1.8 cm   LEFT ATRIUM       Index    RIGHT  ATRIUM      Index  LA diam:    4.90 cm 2.87 cm/m RA Area:   11.90 cm  LA Vol (A2C):  100.0 ml 58.52 ml/m RA Volume:  25.10 ml 14.69 ml/m  LA Vol (A4C):  80.9 ml 47.35 ml/m  LA Biplane Vol: 93.5 ml 54.72 ml/m  AORTIC VALVE  LVOT Vmax:  88.10 cm/s  LVOT Vmean: 69.000 cm/s  LVOT VTI:  0.191 m    AORTA  Ao Root diam: 3.00 cm  Ao Asc diam: 3.60 cm   MITRAL VALVE         TRICUSPID VALVE  MV Area (PHT): 5.84 cm   TR Peak grad:  52.4 mmHg  MV Decel Time: 130 msec   TR Vmax:    362.00 cm/s  MR Peak grad:  111.9 mmHg  MR Mean grad:  62.0 mmHg  SHUNTS  MR Vmax:     529.00 cm/s Systemic VTI: 0.19 m  MR Vmean:    362.0 cm/s Systemic Diam: 1.90 cm  MR PISA:     4.54 cm  MR PISA Eff ROA: 33 mm  MR PISA Radius: 0.85 cm  MV E velocity: 132.00 cm/s  MV A velocity: 60.80 cm/s  MV E/A ratio: 2.17    Recent Labs: 01/14/2020: ALT 15; TSH 2.420 02/18/2020: BUN 19; Creatinine, Ser 1.16; Hemoglobin 10.4; Platelets 467; Potassium 4.1; Sodium 141  Recent Lipid Panel    Component Value Date/Time   CHOL 136 06/11/2019 1035   TRIG 175 (H) 06/11/2019 1035   HDL 43 06/11/2019 1035   CHOLHDL 3.2 06/11/2019 1035   LDLCALC 58 06/11/2019 1035    Physical Exam:    VS:  BP 104/60   Pulse 84   Ht 5\' 1"  (1.549 m)   Wt 153 lb 12.8 oz (69.8 kg)   BMI 29.06 kg/m     Wt Readings from Last 3 Encounters:  02/18/20 153 lb 12.8 oz (69.8 kg)  02/14/20 153 lb 9.6 oz (69.7 kg)  01/14/20 157 lb (71.2 kg)     GEN:  Elderly woman in no acute distress HEENT: Normal NECK: No JVD; No carotid bruits LYMPHATICS: No lymphadenopathy CARDIAC: RRR, 2/6 holosystolic murmur only heard at the axilla RESPIRATORY:  Clear to auscultation without rales, wheezing or rhonchi  ABDOMEN: Soft, non-tender, non-distended MUSCULOSKELETAL:  No edema; No deformity  SKIN: Warm and dry NEUROLOGIC:  Alert and oriented x 3 PSYCHIATRIC:  Normal affect   STS  Risk Calculator: MItral Valve Repair - 4.307% Mitral Valve Replacement - 6.343%  ASSESSMENT:    1. Nonrheumatic mitral valve regurgitation    PLAN:    In order of problems listed above:  84 yo woman with severe non-rheumatic mitral regurgitation and NYHA functional Class 3 symptoms of acute on chronic systolic heart failure. She appears to have non-ischemic cardiomyopathy. All available data is reviewed. Will send for further records from her hospitalization at [facility] and try to obtain cardiac catheterization films. However, it appears she does not have flow obstructive CAD based on review of cath notes. I have personally reviewed her recent echocardiogram which demonstrates moderately severe LV dysfunction with severe central mitral regurgitation, EROA of 0.37. Her LV size is mildly dilated. Titration of medical therapy has been very limited by her low BP, frequently with systolic BP's  ranging in the 80's and 90's.   I have reviewed the natural history of mitral regurgitation with the patient and their family members who are present today. We have discussed the limitations of medical therapy and the poor prognosis associated with symptomatic mitral regurgitation. We have also reviewed potential treatment options, including palliative medical therapy, conventional surgical mitral valve repair or replacement, and percutaneous mitral valve therapies such as edge-to-edge mitral valve approximation with MitraClip. We discussed treatment options in the context of this patient's specific comorbid medical conditions.   As a next step in her evaluation, I have recommended transesophageal echo to define the functional anatomy of her mitral valve and determine whether she might be a candidate for transcatheter edge-to-edge mitral valve repair. I have reviewed risks, indications, and alternative to TEE with the patient and her son-in-law today. I will follow-up with her after the TEE. If she is  found to have secondary MR related to her cardiomyopathy, I will refer her for formal heart failure consultation as part of a multidisciplinary approach to her care. However, if she is shown to have primary MR related to leaflet pathology, I will refer her for formal cardiac surgical consultation.   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 It is felt that CRT could be considered if other therapies are not effective, but she would not be predicted to have a high likelihood of benefit. I reviewed the MitraClip procedure in detail with the patient today, and she understands this may be a treatment option pending further multidisciplinary review following TEE.    Medication Adjustments/Labs and Tests Ordered: Current medicines are reviewed at length with the patient today.  Concerns regarding medicines are outlined above.  Orders Placed This Encounter  Procedures  . Basic metabolic panel  . CBC with Differential/Platelet  . EKG 12-Lead   No orders of the defined types were placed in this encounter.   Patient Instructions  COVID SCREENING INFORMATION (4/27): You are scheduled for your drive-thru COVID screening on 4/27 at 1:45PM. [facility] ([facility] [address] Stay in the RIGHT lane and proceed under the brick awning and tell them you are there for pre-procedure testing. Do NOT bring any pets with you to the testing site. You will need to go home after your screening and quarantine until your procedure.   PROCEDURE INSTRUCTIONS (4/29): You are scheduled for a TEE on Thursday, 4/29 with Dr. Ena Dawley.  Please arrive at the [facility] (Main Entrance A) at [facility]: [address] [place], [place] [address] at 1:00PM.  DIET: Nothing to eat or drink after midnight except a sip of water with medications (see medication instructions below)  Medication Instructions: 1) HOLD LASIX AND  HYDROCHLOROTHIAZIDE the morning of your procedure 2) HOLD METFORMIN until you can eat  3) Take your other medications as directed with sips of water  Labs: TODAY! BMET, CBC  You must have a responsible person to drive you home and stay in the waiting area during your procedure. Failure to do so could result in cancellation.  Bring your insurance cards.  *Special Note: Every effort is made to have your procedure done on time. Occasionally there are emergencies that occur at the hospital that may cause delays. Please be patient if a delay does occur.      Signed, Sherren Mocha, MD  02/20/2020 6:59 AM    [facility]

## 2020-02-19 ENCOUNTER — Other Ambulatory Visit (HOSPITAL_COMMUNITY)
Admission: RE | Admit: 2020-02-19 | Discharge: 2020-02-19 | Disposition: A | Discharge status: Home / Self Care | Payer: Medicare Other | Source: Ambulatory Visit | Attending: Cardiology | Admitting: Cardiology

## 2020-02-19 DIAGNOSIS — Z20822 Contact with and (suspected) exposure to covid-19: Secondary | ICD-10-CM | POA: Diagnosis not present

## 2020-02-19 DIAGNOSIS — D500 Iron deficiency anemia secondary to blood loss (chronic): Secondary | ICD-10-CM | POA: Diagnosis not present

## 2020-02-19 DIAGNOSIS — Z01812 Encounter for preprocedural laboratory examination: Secondary | ICD-10-CM | POA: Insufficient documentation

## 2020-02-19 LAB — CBC WITH DIFFERENTIAL/PLATELET
Basophils Absolute: 0.1 10*3/uL (ref 0.0–0.2)
Basos: 1 %
EOS (ABSOLUTE): 0.2 10*3/uL (ref 0.0–0.4)
Eos: 2 %
Hematocrit: 33.7 % — ABNORMAL LOW (ref 34.0–46.6)
Hemoglobin: 10.4 g/dL — ABNORMAL LOW (ref 11.1–15.9)
Immature Grans (Abs): 0 10*3/uL (ref 0.0–0.1)
Immature Granulocytes: 0 %
Lymphocytes Absolute: 2.1 10*3/uL (ref 0.7–3.1)
Lymphs: 25 %
MCH: 23.5 pg — ABNORMAL LOW (ref 26.6–33.0)
MCHC: 30.9 g/dL — ABNORMAL LOW (ref 31.5–35.7)
MCV: 76 fL — ABNORMAL LOW (ref 79–97)
Monocytes Absolute: 0.7 10*3/uL (ref 0.1–0.9)
Monocytes: 9 %
Neutrophils Absolute: 5.3 10*3/uL (ref 1.4–7.0)
Neutrophils: 63 %
Platelets: 467 10*3/uL — ABNORMAL HIGH (ref 150–450)
RBC: 4.42 x10E6/uL (ref 3.77–5.28)
RDW: 14.7 % (ref 11.7–15.4)
WBC: 8.4 10*3/uL (ref 3.4–10.8)

## 2020-02-19 LAB — SARS CORONAVIRUS 2 (TAT 6-24 HRS): SARS Coronavirus 2: NEGATIVE

## 2020-02-19 LAB — BASIC METABOLIC PANEL
BUN/Creatinine Ratio: 16 (ref 12–28)
BUN: 19 mg/dL (ref 8–27)
CO2: 18 mmol/L — ABNORMAL LOW (ref 20–29)
Calcium: 9.5 mg/dL (ref 8.7–10.3)
Chloride: 101 mmol/L (ref 96–106)
Creatinine, Ser: 1.16 mg/dL — ABNORMAL HIGH (ref 0.57–1.00)
GFR calc Af Amer: 51 mL/min/{1.73_m2} — ABNORMAL LOW (ref >59)
GFR calc non Af Amer: 44 mL/min/{1.73_m2} — ABNORMAL LOW (ref >59)
Glucose: 94 mg/dL (ref 65–99)
Potassium: 4.1 mmol/L (ref 3.5–5.2)
Sodium: 141 mmol/L (ref 134–144)

## 2020-02-20 ENCOUNTER — Encounter: Payer: Self-pay | Admitting: Cardiovascular Disease

## 2020-02-21 ENCOUNTER — Encounter (HOSPITAL_COMMUNITY)
Admission: RE | Disposition: A | Discharge status: Home / Self Care | Payer: Self-pay | Source: Home / Self Care | Attending: Cardiology

## 2020-02-21 ENCOUNTER — Ambulatory Visit (HOSPITAL_BASED_OUTPATIENT_CLINIC_OR_DEPARTMENT_OTHER)
Admission: RE | Admit: 2020-02-21 | Discharge: 2020-02-21 | Disposition: A | Discharge status: Home / Self Care | Payer: Medicare Other | Source: Ambulatory Visit | Attending: Cardiovascular Disease | Admitting: Cardiovascular Disease

## 2020-02-21 ENCOUNTER — Ambulatory Visit (HOSPITAL_COMMUNITY): Payer: Medicare Other | Admitting: Anesthesiology

## 2020-02-21 ENCOUNTER — Other Ambulatory Visit: Payer: Self-pay

## 2020-02-21 ENCOUNTER — Encounter (HOSPITAL_COMMUNITY): Payer: Self-pay | Admitting: Cardiology

## 2020-02-21 ENCOUNTER — Ambulatory Visit (HOSPITAL_COMMUNITY)
Admission: RE | Admit: 2020-02-21 | Discharge: 2020-02-21 | Disposition: A | Discharge status: Home / Self Care | Payer: Medicare Other | Attending: Cardiology | Admitting: Cardiology

## 2020-02-21 DIAGNOSIS — I340 Nonrheumatic mitral (valve) insufficiency: Secondary | ICD-10-CM

## 2020-02-21 DIAGNOSIS — Z7984 Long term (current) use of oral hypoglycemic drugs: Secondary | ICD-10-CM | POA: Diagnosis not present

## 2020-02-21 DIAGNOSIS — Z885 Allergy status to narcotic agent status: Secondary | ICD-10-CM | POA: Insufficient documentation

## 2020-02-21 DIAGNOSIS — Z7982 Long term (current) use of aspirin: Secondary | ICD-10-CM | POA: Insufficient documentation

## 2020-02-21 DIAGNOSIS — Z7951 Long term (current) use of inhaled steroids: Secondary | ICD-10-CM | POA: Diagnosis not present

## 2020-02-21 DIAGNOSIS — I110 Hypertensive heart disease with heart failure: Secondary | ICD-10-CM | POA: Diagnosis not present

## 2020-02-21 DIAGNOSIS — Z87891 Personal history of nicotine dependence: Secondary | ICD-10-CM | POA: Diagnosis not present

## 2020-02-21 DIAGNOSIS — I5023 Acute on chronic systolic (congestive) heart failure: Secondary | ICD-10-CM | POA: Diagnosis not present

## 2020-02-21 DIAGNOSIS — E039 Hypothyroidism, unspecified: Secondary | ICD-10-CM | POA: Insufficient documentation

## 2020-02-21 DIAGNOSIS — I428 Other cardiomyopathies: Secondary | ICD-10-CM | POA: Diagnosis not present

## 2020-02-21 DIAGNOSIS — E119 Type 2 diabetes mellitus without complications: Secondary | ICD-10-CM | POA: Diagnosis not present

## 2020-02-21 DIAGNOSIS — Z882 Allergy status to sulfonamides status: Secondary | ICD-10-CM | POA: Diagnosis not present

## 2020-02-21 DIAGNOSIS — I081 Rheumatic disorders of both mitral and tricuspid valves: Secondary | ICD-10-CM | POA: Diagnosis not present

## 2020-02-21 DIAGNOSIS — I2510 Atherosclerotic heart disease of native coronary artery without angina pectoris: Secondary | ICD-10-CM | POA: Diagnosis not present

## 2020-02-21 DIAGNOSIS — Z881 Allergy status to other antibiotic agents status: Secondary | ICD-10-CM | POA: Diagnosis not present

## 2020-02-21 DIAGNOSIS — G4733 Obstructive sleep apnea (adult) (pediatric): Secondary | ICD-10-CM | POA: Insufficient documentation

## 2020-02-21 DIAGNOSIS — E785 Hyperlipidemia, unspecified: Secondary | ICD-10-CM | POA: Insufficient documentation

## 2020-02-21 DIAGNOSIS — I10 Essential (primary) hypertension: Secondary | ICD-10-CM | POA: Diagnosis not present

## 2020-02-21 DIAGNOSIS — K219 Gastro-esophageal reflux disease without esophagitis: Secondary | ICD-10-CM | POA: Diagnosis not present

## 2020-02-21 DIAGNOSIS — Z79899 Other long term (current) drug therapy: Secondary | ICD-10-CM | POA: Diagnosis not present

## 2020-02-21 HISTORY — PX: TEE WITHOUT CARDIOVERSION: SHX5443

## 2020-02-21 LAB — GLUCOSE, CAPILLARY: Glucose-Capillary: 101 mg/dL — ABNORMAL HIGH (ref 70–99)

## 2020-02-21 SURGERY — ECHOCARDIOGRAM, TRANSESOPHAGEAL
Anesthesia: Monitor Anesthesia Care

## 2020-02-21 MED ORDER — PROPOFOL 10 MG/ML IV BOLUS
INTRAVENOUS | Status: DC | PRN
  Administered 2020-02-21 (×2): 10 mg via INTRAVENOUS
  Administered 2020-02-21 (×2): 20 mg via INTRAVENOUS

## 2020-02-21 MED ORDER — SODIUM CHLORIDE 0.9 % IV SOLN: INTRAVENOUS | Status: DC

## 2020-02-21 MED ORDER — PROPOFOL 500 MG/50ML IV EMUL
INTRAVENOUS | Status: DC | PRN
  Administered 2020-02-21: 100 ug/kg/min via INTRAVENOUS

## 2020-02-21 MED ORDER — PHENYLEPHRINE 40 MCG/ML (10ML) SYRINGE FOR IV PUSH (FOR BLOOD PRESSURE SUPPORT)
PREFILLED_SYRINGE | INTRAVENOUS | Status: DC | PRN
  Administered 2020-02-21 (×2): 80 ug via INTRAVENOUS

## 2020-02-21 NOTE — Transfer of Care (Signed)
Immediate Anesthesia Transfer of Care Note  Patient: Cereniti Curb  Procedure(s) Performed: TRANSESOPHAGEAL ECHOCARDIOGRAM (TEE) (N/A )  Patient Location: Endoscopy Unit  Anesthesia Type:MAC  Level of Consciousness: drowsy and patient cooperative  Airway & Oxygen Therapy: Patient Spontanous Breathing and Patient connected to nasal cannula oxygen  Post-op Assessment: Report given to RN, Post -op Vital signs reviewed and stable and Patient moving all extremities  Post vital signs: Reviewed and stable  Last Vitals:  Vitals Value Taken Time  BP 90/59 02/21/20 1011  Temp    Pulse 75 02/21/20 1012  Resp 28 02/21/20 1012  SpO2 94 % 02/21/20 1012  Vitals shown include unvalidated device data.  Last Pain:  Vitals:   02/21/20 1009  TempSrc:   PainSc: 0-No pain         Complications: No apparent anesthesia complications

## 2020-02-21 NOTE — Interval H&P Note (Signed)
History and Physical Interval Note:  02/21/2020 8:22 AM  Vanessa Johnson  has presented today for surgery, with the diagnosis of MITRAL REGURGITATION.  The various methods of treatment have been discussed with the patient and family. After consideration of risks, benefits and other options for treatment, the patient has consented to  Procedure(s): TRANSESOPHAGEAL ECHOCARDIOGRAM (TEE) (N/A) as a surgical intervention.  The patient's history has been reviewed, patient examined, no change in status, stable for surgery.  I have reviewed the patient's chart and labs.  Questions were answered to the patient's satisfaction.     Ena Dawley

## 2020-02-21 NOTE — Discharge Instructions (Signed)

## 2020-02-21 NOTE — Progress Notes (Signed)
  Echocardiogram Echocardiogram Transesophageal has been performed.  Vanessa Johnson 02/21/2020, 10:24 AM

## 2020-02-21 NOTE — Anesthesia Preprocedure Evaluation (Addendum)
Anesthesia Evaluation  Patient identified by MRN, date of birth, ID band Patient awake    Reviewed: Allergy & Precautions, NPO status , Patient's Chart, lab work & pertinent test results  History of Anesthesia Complications (+) AWARENESS UNDER ANESTHESIA  Airway Mallampati: II  TM Distance: >3 FB Neck ROM: Full    Dental  (+) Dental Advisory Given   Pulmonary asthma , sleep apnea , former smoker,    Pulmonary exam normal breath sounds clear to auscultation       Cardiovascular hypertension, + CAD  + Valvular Problems/Murmurs MR  Rhythm:Regular Rate:Normal + Systolic murmurs    Neuro/Psych negative neurological ROS  negative psych ROS   GI/Hepatic Neg liver ROS, GERD  ,  Endo/Other  diabetesHypothyroidism   Renal/GU negative Renal ROS     Musculoskeletal negative musculoskeletal ROS (+)   Abdominal   Peds  Hematology negative hematology ROS (+)   Anesthesia Other Findings   Reproductive/Obstetrics negative OB ROS                            Anesthesia Physical Anesthesia Plan  ASA: III  Anesthesia Plan: MAC   Post-op Pain Management:    Induction: Intravenous  PONV Risk Score and Plan: 2 and Propofol infusion, TIVA, Treatment may vary due to age or medical condition and Ondansetron  Airway Management Planned: Natural Airway  Additional Equipment:   Intra-op Plan:   Post-operative Plan:   Informed Consent: I have reviewed the patients History and Physical, chart, labs and discussed the procedure including the risks, benefits and alternatives for the proposed anesthesia with the patient or authorized representative who has indicated his/her understanding and acceptance.     Dental advisory given  Plan Discussed with: CRNA  Anesthesia Plan Comments:        Anesthesia Quick Evaluation

## 2020-02-21 NOTE — Anesthesia Procedure Notes (Signed)
Procedure Name: MAC Date/Time: 02/21/2020 9:30 AM Performed by: Moshe Salisbury, CRNA Pre-anesthesia Checklist: Patient identified, Emergency Drugs available, Suction available, Patient being monitored and Timeout performed Patient Re-evaluated:Patient Re-evaluated prior to induction Oxygen Delivery Method: Nasal cannula Placement Confirmation: positive ETCO2 Dental Injury: Teeth and Oropharynx as per pre-operative assessment

## 2020-02-21 NOTE — CV Procedure (Signed)
   Transesophageal Echocardiogram Note  Vanessa Johnson [id number] [birth year]  Procedure: Transesophageal Echocardiogram Indications: mitral regurgitation  Procedure Details Consent: Obtained Time Out: Verified patient identification, verified procedure, site/side was marked, verified correct patient position, special equipment/implants available, Radiology Safety Procedures followed,  medications/allergies/relevent history reviewed, required imaging and test results available.  Performed  Medications: During this procedure the patient is administered a total of Propofol 220 mg to achieve and maintain deep sedation administered by anesthesia staff.  The patient's heart rate, blood pressure, and oxygen saturation are monitored continuously during the procedure. The period of conscious sedation is 30 minutes, of which I was present face-to-face 100% of this time.    1. LVEF 30-35% with significant dyssynchrony.   2. Mitral regurgitation is severe with a large central jet hitting the  posterior wall directed postero-medially originating at the border of A2/3  and P2/3. This a functional secondary mitral regurgitation. Posterior  leaflet lenght is 11 mm. There is  reversal of forward flow in the right sided pulmonary arteries. MVA 5 cm2.  Mean transmitral gradient 2 mmHg. Quantitative MR evaluation is  underestimated by hypotension during acquisition.   3. There is significant LV dyssynchrony. Left ventricular ejection  fraction, by estimation, is 30 to 35%. The left ventricle has moderately  decreased function. The left ventricle demonstrates global hypokinesis.  The left ventricular internal cavity size   was mildly dilated. Left ventricular diastolic function could not be  evaluated.   4. Right ventricular systolic function is normal. The right ventricular  size is normal. There is mildly elevated pulmonary artery systolic  pressure. The estimated right ventricular systolic pressure is  [id number] mmHg.   5. Left atrial size was severely dilated. No left atrial/left atrial  appendage thrombus was detected.   6. Right atrial size was severely dilated.   7. The mitral valve is normal in structure. Severe mitral valve  regurgitation. No evidence of mitral stenosis. The mean mitral valve  gradient is 2.0 mmHg.   8. Tricuspid valve regurgitation is severe.   9. 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 . The  aortic valve is normal in structure. Aortic valve regurgitation is not  visualized. No aortic stenosis is present.  10. There is Moderate (Grade III) atheroma plaque.  11. The inferior vena cava is normal in size with <50% respiratory  variability, suggesting right atrial pressure of 8 mmHg.   Complications: No apparent complications Patient did tolerate procedure well.  Ena Dawley, MD, [facility] 02/21/2020, 10:16 AM

## 2020-02-22 ENCOUNTER — Encounter: Payer: Self-pay | Admitting: Anesthesiology

## 2020-02-22 ENCOUNTER — Telehealth: Payer: Self-pay | Admitting: Cardiology

## 2020-02-22 NOTE — Anesthesia Postprocedure Evaluation (Signed)
Anesthesia Post Note  Patient: Vanessa Johnson  Procedure(s) Performed: TRANSESOPHAGEAL ECHOCARDIOGRAM (TEE) (N/A )     Patient location during evaluation: PACU Anesthesia Type: MAC Level of consciousness: awake and alert Pain management: pain level controlled Vital Signs Assessment: post-procedure vital signs reviewed and stable Respiratory status: spontaneous breathing Cardiovascular status: stable Anesthetic complications: no    Last Vitals:  Vitals:   02/21/20 1015 02/21/20 1020  BP: (!) 90/59 (!) 89/59  Pulse: 75 73  Resp: (!) 30 20  Temp:    SpO2: 95% 96%    Last Pain:  Vitals:   02/22/20 1520  TempSrc:   PainSc: 0-No pain                 Nolon Nations

## 2020-02-22 NOTE — Telephone Encounter (Signed)
Medical records requested from [facility]. 02/22/20 vlm

## 2020-02-25 DIAGNOSIS — L578 Other skin changes due to chronic exposure to nonionizing radiation: Secondary | ICD-10-CM | POA: Diagnosis not present

## 2020-02-25 DIAGNOSIS — L570 Actinic keratosis: Secondary | ICD-10-CM | POA: Diagnosis not present

## 2020-02-25 DIAGNOSIS — L821 Other seborrheic keratosis: Secondary | ICD-10-CM | POA: Diagnosis not present

## 2020-02-27 ENCOUNTER — Telehealth: Payer: Self-pay | Admitting: Cardiology

## 2020-02-27 NOTE — Telephone Encounter (Signed)
Cd of heart cath received from [facility]. Given to [place], RN. 02/27/20

## 2020-02-28 ENCOUNTER — Ambulatory Visit
Admission: RE | Admit: 2020-02-28 | Discharge: 2020-02-28 | Disposition: A | Discharge status: Home / Self Care | Payer: Self-pay | Source: Ambulatory Visit | Attending: Cardiovascular Disease | Admitting: Cardiovascular Disease

## 2020-02-28 ENCOUNTER — Other Ambulatory Visit: Payer: Self-pay

## 2020-02-28 DIAGNOSIS — I340 Nonrheumatic mitral (valve) insufficiency: Secondary | ICD-10-CM

## 2020-03-06 ENCOUNTER — Ambulatory Visit (HOSPITAL_COMMUNITY)
Admission: RE | Admit: 2020-03-06 | Discharge: 2020-03-06 | Disposition: A | Discharge status: Home / Self Care | Payer: Medicare Other | Source: Ambulatory Visit | Attending: Cardiology | Admitting: Cardiology

## 2020-03-06 ENCOUNTER — Other Ambulatory Visit: Payer: Self-pay | Admitting: Cardiology

## 2020-03-06 ENCOUNTER — Other Ambulatory Visit: Payer: Self-pay

## 2020-03-06 ENCOUNTER — Encounter (HOSPITAL_COMMUNITY): Payer: Self-pay | Admitting: Cardiology

## 2020-03-06 VITALS — BP 82/54 | HR 86 | Wt 153.0 lb

## 2020-03-06 DIAGNOSIS — Z79899 Other long term (current) drug therapy: Secondary | ICD-10-CM | POA: Diagnosis not present

## 2020-03-06 DIAGNOSIS — I2510 Atherosclerotic heart disease of native coronary artery without angina pectoris: Secondary | ICD-10-CM

## 2020-03-06 DIAGNOSIS — K219 Gastro-esophageal reflux disease without esophagitis: Secondary | ICD-10-CM | POA: Insufficient documentation

## 2020-03-06 DIAGNOSIS — Z87891 Personal history of nicotine dependence: Secondary | ICD-10-CM | POA: Diagnosis not present

## 2020-03-06 DIAGNOSIS — Z8249 Family history of ischemic heart disease and other diseases of the circulatory system: Secondary | ICD-10-CM | POA: Insufficient documentation

## 2020-03-06 DIAGNOSIS — Z79890 Hormone replacement therapy (postmenopausal): Secondary | ICD-10-CM | POA: Diagnosis not present

## 2020-03-06 DIAGNOSIS — E785 Hyperlipidemia, unspecified: Secondary | ICD-10-CM | POA: Insufficient documentation

## 2020-03-06 DIAGNOSIS — I5022 Chronic systolic (congestive) heart failure: Secondary | ICD-10-CM

## 2020-03-06 DIAGNOSIS — Z7984 Long term (current) use of oral hypoglycemic drugs: Secondary | ICD-10-CM | POA: Insufficient documentation

## 2020-03-06 DIAGNOSIS — I340 Nonrheumatic mitral (valve) insufficiency: Secondary | ICD-10-CM

## 2020-03-06 DIAGNOSIS — E119 Type 2 diabetes mellitus without complications: Secondary | ICD-10-CM | POA: Diagnosis not present

## 2020-03-06 DIAGNOSIS — I428 Other cardiomyopathies: Secondary | ICD-10-CM | POA: Diagnosis not present

## 2020-03-06 DIAGNOSIS — I081 Rheumatic disorders of both mitral and tricuspid valves: Secondary | ICD-10-CM | POA: Insufficient documentation

## 2020-03-06 DIAGNOSIS — I447 Left bundle-branch block, unspecified: Secondary | ICD-10-CM | POA: Insufficient documentation

## 2020-03-06 DIAGNOSIS — E039 Hypothyroidism, unspecified: Secondary | ICD-10-CM | POA: Insufficient documentation

## 2020-03-06 DIAGNOSIS — Z833 Family history of diabetes mellitus: Secondary | ICD-10-CM | POA: Insufficient documentation

## 2020-03-06 DIAGNOSIS — Z7982 Long term (current) use of aspirin: Secondary | ICD-10-CM | POA: Diagnosis not present

## 2020-03-06 LAB — BASIC METABOLIC PANEL
Anion gap: 16 — ABNORMAL HIGH (ref 5–15)
BUN: 25 mg/dL — ABNORMAL HIGH (ref 8–23)
CO2: 23 mmol/L (ref 22–32)
Calcium: 9.6 mg/dL (ref 8.9–10.3)
Chloride: 98 mmol/L (ref 98–111)
Creatinine, Ser: 1.21 mg/dL — ABNORMAL HIGH (ref 0.44–1.00)
GFR calc Af Amer: 48 mL/min — ABNORMAL LOW (ref >60)
GFR calc non Af Amer: 42 mL/min — ABNORMAL LOW (ref >60)
Glucose, Bld: 104 mg/dL — ABNORMAL HIGH (ref 70–99)
Potassium: 3.8 mmol/L (ref 3.5–5.1)
Sodium: 137 mmol/L (ref 135–145)

## 2020-03-06 LAB — BRAIN NATRIURETIC PEPTIDE: B Natriuretic Peptide: 2424.4 pg/mL — ABNORMAL HIGH (ref 0.0–100.0)

## 2020-03-06 MED ORDER — FUROSEMIDE 40 MG PO TABS: 40.0000 mg | ORAL_TABLET | Freq: Two times a day (BID) | ORAL | 4 refills | Status: DC

## 2020-03-06 MED ORDER — POTASSIUM CHLORIDE ER 10 MEQ PO TBCR: 20.0000 meq | EXTENDED_RELEASE_TABLET | Freq: Every day | ORAL | 4 refills | Status: DC

## 2020-03-06 MED ORDER — DIGOXIN 62.5 MCG PO TABS
0.0625 mg | ORAL_TABLET | Freq: Every day | ORAL | 4 refills | Status: DC
Start: 2020-03-06 — End: 2020-05-14

## 2020-03-06 MED ORDER — DAPAGLIFLOZIN PROPANEDIOL 10 MG PO TABS
10.0000 mg | ORAL_TABLET | Freq: Every day | ORAL | 5 refills | Status: DC
Start: 2020-03-06 — End: 2020-04-07

## 2020-03-06 MED ORDER — FUROSEMIDE 20 MG PO TABS: 20.0000 mg | ORAL_TABLET | Freq: Two times a day (BID) | ORAL | 3 refills | Status: DC

## 2020-03-06 NOTE — Telephone Encounter (Signed)
*  STAT* If patient is at the pharmacy, call can be transferred to refill team. ° ° °1. Which medications need to be refilled? (please list name of each medication and dose if known) furosemide (LASIX) 20 MG tablet ° °2. Which pharmacy/location (including street and city if local pharmacy) is medication to be sent to? [facility] - [place], [place] - [address] °3. Do they need a 30 day or 90 day supply? 90 ° °

## 2020-03-06 NOTE — Telephone Encounter (Signed)
Refill sent in per request.  

## 2020-03-06 NOTE — Patient Instructions (Addendum)
INCREASE Lasix to 60mg  (1.5 tabs) in the morning and (1 tab) in the evening for 2 days. THEN start taking 40mg  (1 tab) twice a day after that  START Digoxin 0.0625mg  (1 tab) daily  START Farxiga 10 (1 tab) daily  INCREASE Potassium to 20 meq (2 tabs) daily  STOP Ranolazine   Labs today We will only contact you if something comes back abnormal or we need to make some changes. Otherwise no news is good news!    Your physician recommends that you schedule a follow-up appointment in: 1 week with Dr [name]   Please call office at [phone] option 2 if you have any questions or concerns.    At the [facility], you and your health needs are our priority. As part of our continuing mission to provide you with exceptional heart care, we have created designated Provider Care Teams. These Care Teams include your primary Cardiologist (physician) and Advanced Practice Providers (APPs- Physician Assistants and Nurse Practitioners) who all work together to provide you with the care you need, when you need it.   You may see any of the following providers on your designated Care Team at your next follow up: Marland Kitchen Dr Glori Bickers . Dr Loralie Champagne . Darrick Grinder, NP . Lyda Jester, PA . Audry Riles, PharmD   Please be sure to bring in all your medications bottles to every appointment.

## 2020-03-07 NOTE — Progress Notes (Signed)
PCP: [name], MD  Cardiology: [name], MD HF Cardiology: Dr. [name]  84 y.o. with history of chronic systolic CHF (nonischemic cardiomyopathy), chronic LBBB, and mitral regurgitation was referred by Dr. Burt Knack for CHF evaluation prior to Mitraclip placement.   CHF was first noted in 12/20, she was admitted to the hospital in [facility] at that time.  LHC showed nonobstructive CAD.  She later followed up with Dr. Geraldo Pitter and had echo in 3/21, showing EF 35-40% with concern for severe MR.  TEE was then done in 4/21 and I reviewed it today, showing EF 30-35% with septal-lateral dyssynchrony, moderate-severe functional MR, normal RV, severe TR.  She has had a LBBB on ECGs in 2021, she did not have LBBB in 2019.    She reports dyspnea since 12/20.  She is not sure what (if anything) triggered the dyspnea.  Currently, she is short of breath walking around her house.  She is short of breath with any moderate exertion.  No orthopnea/PND.  No chest pain.  BP has been low but she denies lightheadedness or syncope.   Labs (4/21): K 4.1, creatinine 1.16  ECG (personally reviewed): NSR, LBBB 154 msec  PMH: 1. LBBB: Chronic.  2. Anemia: Prior GI workup in  was negative.  No history of overt GI bleeding.  3. Type 2 diabetes 4. Hyperlipidemia 5. CAD: LHC (2020) with 40% ostial left main, 50% mid RCA.  6. Chronic systolic CHF: Noted since 12/20.  Nonischemic cardiomyopathy.  - TEE (4/21): EF 30-35%, septal-lateral dyssynchrony, moderate-severe MR appears functional, normal RV size and systolic function, severe biatrial enlargement, severe TR.  7. GERD 8. Hyperlipidemia 9. Hypothyroidism  Social History   Socioeconomic History  . Marital status: Widowed    Spouse name: Not on file  . Number of children: Not on file  . Years of education: Not on file  . Highest education level: Not on file  Occupational History  . Not on file  Tobacco Use  . Smoking status: Former Research scientist (life sciences)  .  Smokeless tobacco: Never Used  Substance and Sexual Activity  . Alcohol use: No  . Drug use: No  . Sexual activity: Not on file  Other Topics Concern  . Not on file  Social History Narrative  . Not on file   Social Determinants of Health   Financial Resource Strain:   . Difficulty of Paying Living Expenses:   Food Insecurity:   . Worried About Charity fundraiser in the Last Year:   . Arboriculturist in the Last Year:   Transportation Needs:   . Film/video editor (Medical):   Marland Kitchen Lack of Transportation (Non-Medical):   Physical Activity:   . Days of Exercise per Week:   . Minutes of Exercise per Session:   Stress:   . Feeling of Stress :   Social Connections:   . Frequency of Communication with Friends and Family:   . Frequency of Social Gatherings with Friends and Family:   . Attends Religious Services:   . Active Member of Clubs or Organizations:   . Attends Archivist Meetings:   Marland Kitchen Marital Status:   Intimate Partner Violence:   . Fear of Current or Ex-Partner:   . Emotionally Abused:   Marland Kitchen Physically Abused:   . Sexually Abused:    Family History  Problem Relation Age of Onset  . Diabetes Mother   . Heart disease Mother   . Hypertension Mother   . Heart  attack Mother   . Emphysema Father   . Hypertension Father   . Heart attack Father   . Breast cancer Sister   . Diabetes Sister   . Emphysema Sister   . COPD Sister   . Asthma Sister   . Hypertension Sister   . Lung cancer Brother   . Diabetes Brother   . Emphysema Brother   . COPD Brother   . Asthma Brother   . Heart disease Brother    ROS: All systems reviewed and negative except as per HPI.   Current Outpatient Medications  Medication Sig Dispense Refill  . albuterol (PROVENTIL) (2.5 MG/3ML) 0.083% nebulizer solution Take 2.5 mg by nebulization every 6 (six) hours as needed for wheezing or shortness of breath.     Marland Kitchen aspirin EC 81 MG tablet Take 1 tablet (81 mg total) by mouth daily. 30  tablet 11  . b complex vitamins tablet Take 1 tablet by mouth daily.    . Calcium Carb-Cholecalciferol (CALCIUM 600 + D PO) Take 1 tablet by mouth daily.    . cetirizine (ZYRTEC) 10 MG tablet Take 10 mg by mouth daily.    . cyclobenzaprine (FLEXERIL) 10 MG tablet Take 10 mg by mouth at bedtime as needed for muscle spasms.     . ferrous sulfate 325 (65 FE) MG EC tablet Take 325 mg by mouth 2 (two) times daily.     . fluticasone (FLONASE) 50 MCG/ACT nasal spray Place 2 sprays into both nostrils daily.     . Fluticasone-Salmeterol (ADVAIR) 250-50 MCG/DOSE AEPB Inhale 1 puff into the lungs 2 (two) times daily as needed (shortness of breath).     . furosemide (LASIX) 40 MG tablet Take 1 tablet (40 mg total) by mouth 2 (two) times daily. 65 tablet 4  . levalbuterol (XOPENEX HFA) 45 MCG/ACT inhaler Inhale 2 puffs into the lungs every 4 (four) hours as needed for wheezing.    Marland Kitchen levothyroxine (SYNTHROID) 88 MCG tablet Take 88 mcg by mouth daily.    . meclizine (ANTIVERT) 25 MG tablet Take 25 mg by mouth every 8 (eight) hours as needed for dizziness or nausea.     . metFORMIN (GLUCOPHAGE) 500 MG tablet Take 750 mg by mouth 2 (two) times daily.     . metoprolol succinate (TOPROL-XL) 100 MG 24 hr tablet Take 100 mg by mouth daily. Take with or immediately following a meal.     . montelukast (SINGULAIR) 10 MG tablet Take 10 mg by mouth at bedtime.     . naproxen sodium (ALEVE) 220 MG tablet Take 220 mg by mouth daily as needed (pain).    . nitroGLYCERIN (NITROSTAT) 0.4 MG SL tablet Place 1 tablet (0.4 mg total) under the tongue every 5 (five) minutes as needed for chest pain. 30 tablet 2  . nystatin-triamcinolone (MYCOLOG II) cream Apply 1 application topically 2 (two) times daily as needed (eczema).     . pantoprazole (PROTONIX) 40 MG tablet Take 40 mg by mouth daily.    Vladimir Faster Glycol-Propyl Glycol (SYSTANE OP) Place 1 drop into both eyes 4 (four) times daily.    . polyethylene glycol (MIRALAX /  GLYCOLAX) packet Take 17 g by mouth daily as needed for mild constipation.     . potassium chloride (KLOR-CON) 10 MEQ tablet Take 2 tablets (20 mEq total) by mouth daily. 60 tablet 4  . rosuvastatin (CRESTOR) 10 MG tablet Take 5 mg by mouth daily.    . Vitamin D, Ergocalciferol, (DRISDOL)  50000 units CAPS capsule Take 50,000 Units by mouth every 14 (fourteen) days.     . vitamin E 400 UNIT capsule Take 400 Units by mouth daily.     . Wheat Dextrin (BENEFIBER) POWD Take 1 Dose by mouth daily.    . dapagliflozin propanediol (FARXIGA) 10 MG TABS tablet Take 10 mg by mouth daily before breakfast. 30 tablet 5  . digoxin 62.5 MCG TABS Take 0.0625 mg by mouth daily. 30 tablet 4   No current facility-administered medications for this encounter.   BP (!) 82/54   Pulse 86   Wt 69.4 kg (153 lb)   SpO2 98%   BMI 28.91 kg/m  General: NAD Neck: JVP 10-12 cm, no thyromegaly or thyroid nodule.  Lungs: Crackles at bases.  CV: Nondisplaced PMI.  Heart regular S1/S2, no S3/S4, 1/6 HSM apex.  1+ edema 1/2 to knees bilaterally.  No carotid bruit.  Normal pedal pulses.  Abdomen: Soft, nontender, no hepatosplenomegaly, no distention.  Skin: Intact without lesions or rashes.  Neurologic: Alert and oriented x 3.  Psych: Normal affect. Extremities: No clubbing or cyanosis.  HEENT: Normal.   Assessment/Plan: 1. Chronic systolic CHF: Patient has a nonischemic cardiomyopathy of uncertain etiology.  She has significant mitral regurgitation.  This is functional, and I do not think that it explains her cardiomyopathy (though mitral regurgitation likely worsens symptoms).  She has a LBBB that is new since the prior ECG in 2019, cannot rule out LBBB cardiomyopathy.  Prior myocarditis is also a consideration.  TEE in 4/21 showed EF 30-35% with prominent septal-lateral dyssynchrony. On exam, she is significantly volume overloaded with NYHA class III symptoms.  Cardiac medication titration will be limited by soft BP.  -  Increase Lasix to 60 qam/40 qpm x 2 days then 40 mg bid. Increase KCl to 20 daily. BMET today and again in 1 week.  - No BP room for Coreg, Entresto, or spironolactone.  - Start digoxin 0.0625 mg daily.  - Start dapagliflozin 10 mg daily.  - She has a wide LBBB and septal-lateral dyssynchrony.  As above, cannot rule out LBBB cardiomyopathy.  I think that she is going to need CRT, question will be whether this would be expected to benefit her enough to improve her mitral regurgitation.  Will discuss with EP.  - Eventual cardiac MRI would be helpful to look for infiltrative disease/myocarditis.  2. CAD: Nonobstructive on 2020 cath.  No chest pain.  - I do not think that she needs ranolazine, will stop it.  - Continue ASA 81 and statin.  3. Mitral regurgitation: I reviewed her TEE.  She has functional mitral regurgitation, moderate-severe/3+ at least on TEE.  Appearance of mitral regurgitation may have been somewhat mitigated by low BP. She does not have a loud murmur on exam. I think that she would be a reasonable Mitraclip candidate.  Would not expect this to fix her cardiomyopathy, but may help symptoms.  However, question at this point would be whether she would be expected to garner enough benefit from CRT to improve her LV function and MR and preclude need for Mitraclip.  I will talk with EP about expected CRT benefit.   Loralie Champagne 03/07/2020

## 2020-03-07 NOTE — Progress Notes (Signed)
[place] - seems like it might be best to try CRT. If she has a good result her MR might be reduced enough that she doesn't need clip. If it doesn't work, I can still clip her valve it's just a little more difficult.

## 2020-03-13 ENCOUNTER — Ambulatory Visit (HOSPITAL_COMMUNITY)
Admission: RE | Admit: 2020-03-13 | Discharge: 2020-03-13 | Disposition: A | Discharge status: Home / Self Care | Payer: Medicare Other | Source: Ambulatory Visit | Attending: Cardiology | Admitting: Cardiology

## 2020-03-13 ENCOUNTER — Other Ambulatory Visit: Payer: Self-pay

## 2020-03-13 VITALS — BP 98/76 | HR 92 | Wt 144.6 lb

## 2020-03-13 DIAGNOSIS — E119 Type 2 diabetes mellitus without complications: Secondary | ICD-10-CM | POA: Diagnosis not present

## 2020-03-13 DIAGNOSIS — Z7982 Long term (current) use of aspirin: Secondary | ICD-10-CM | POA: Diagnosis not present

## 2020-03-13 DIAGNOSIS — Z825 Family history of asthma and other chronic lower respiratory diseases: Secondary | ICD-10-CM | POA: Diagnosis not present

## 2020-03-13 DIAGNOSIS — E785 Hyperlipidemia, unspecified: Secondary | ICD-10-CM | POA: Insufficient documentation

## 2020-03-13 DIAGNOSIS — I447 Left bundle-branch block, unspecified: Secondary | ICD-10-CM | POA: Diagnosis not present

## 2020-03-13 DIAGNOSIS — Z8249 Family history of ischemic heart disease and other diseases of the circulatory system: Secondary | ICD-10-CM | POA: Diagnosis not present

## 2020-03-13 DIAGNOSIS — K219 Gastro-esophageal reflux disease without esophagitis: Secondary | ICD-10-CM | POA: Insufficient documentation

## 2020-03-13 DIAGNOSIS — I428 Other cardiomyopathies: Secondary | ICD-10-CM | POA: Insufficient documentation

## 2020-03-13 DIAGNOSIS — E039 Hypothyroidism, unspecified: Secondary | ICD-10-CM | POA: Diagnosis not present

## 2020-03-13 DIAGNOSIS — I2510 Atherosclerotic heart disease of native coronary artery without angina pectoris: Secondary | ICD-10-CM | POA: Insufficient documentation

## 2020-03-13 DIAGNOSIS — Z87891 Personal history of nicotine dependence: Secondary | ICD-10-CM | POA: Insufficient documentation

## 2020-03-13 DIAGNOSIS — Z833 Family history of diabetes mellitus: Secondary | ICD-10-CM | POA: Insufficient documentation

## 2020-03-13 DIAGNOSIS — I340 Nonrheumatic mitral (valve) insufficiency: Secondary | ICD-10-CM | POA: Diagnosis not present

## 2020-03-13 DIAGNOSIS — Z7951 Long term (current) use of inhaled steroids: Secondary | ICD-10-CM | POA: Diagnosis not present

## 2020-03-13 DIAGNOSIS — Z79899 Other long term (current) drug therapy: Secondary | ICD-10-CM | POA: Insufficient documentation

## 2020-03-13 DIAGNOSIS — Z801 Family history of malignant neoplasm of trachea, bronchus and lung: Secondary | ICD-10-CM | POA: Insufficient documentation

## 2020-03-13 DIAGNOSIS — Z803 Family history of malignant neoplasm of breast: Secondary | ICD-10-CM | POA: Insufficient documentation

## 2020-03-13 DIAGNOSIS — Z79890 Hormone replacement therapy (postmenopausal): Secondary | ICD-10-CM | POA: Diagnosis not present

## 2020-03-13 DIAGNOSIS — I081 Rheumatic disorders of both mitral and tricuspid valves: Secondary | ICD-10-CM | POA: Insufficient documentation

## 2020-03-13 DIAGNOSIS — Z7984 Long term (current) use of oral hypoglycemic drugs: Secondary | ICD-10-CM | POA: Insufficient documentation

## 2020-03-13 DIAGNOSIS — I5022 Chronic systolic (congestive) heart failure: Secondary | ICD-10-CM | POA: Diagnosis not present

## 2020-03-13 LAB — BASIC METABOLIC PANEL
Anion gap: 15 (ref 5–15)
BUN: 17 mg/dL (ref 8–23)
CO2: 24 mmol/L (ref 22–32)
Calcium: 9.1 mg/dL (ref 8.9–10.3)
Chloride: 97 mmol/L — ABNORMAL LOW (ref 98–111)
Creatinine, Ser: 1.2 mg/dL — ABNORMAL HIGH (ref 0.44–1.00)
GFR calc Af Amer: 49 mL/min — ABNORMAL LOW (ref >60)
GFR calc non Af Amer: 42 mL/min — ABNORMAL LOW (ref >60)
Glucose, Bld: 114 mg/dL — ABNORMAL HIGH (ref 70–99)
Potassium: 3.7 mmol/L (ref 3.5–5.1)
Sodium: 136 mmol/L (ref 135–145)

## 2020-03-13 LAB — BRAIN NATRIURETIC PEPTIDE: B Natriuretic Peptide: 2200.9 pg/mL — ABNORMAL HIGH (ref 0.0–100.0)

## 2020-03-13 LAB — DIGOXIN LEVEL: Digoxin Level: 0.4 ng/mL — ABNORMAL LOW (ref 0.8–2.0)

## 2020-03-13 MED ORDER — FUROSEMIDE 40 MG PO TABS: 60.0000 mg | ORAL_TABLET | Freq: Two times a day (BID) | ORAL | 4 refills | Status: DC

## 2020-03-13 MED ORDER — SPIRONOLACTONE 25 MG PO TABS
12.5000 mg | ORAL_TABLET | Freq: Every day | ORAL | 3 refills | Status: DC
Start: 2020-03-13 — End: 2020-05-08

## 2020-03-13 NOTE — Patient Instructions (Addendum)
INCREASE Lasix to 60mg  (1.5 tabs) twice a day   START Spironolactone 12.5mg  (1/2 tab) daily   Labs today and repeat in 1 week (a script was given to go locally) We will only contact you if something comes back abnormal or we need to make some changes. Otherwise no news is good news!   You have been referred to Dr Lovena Le with Electrophysiology.  They will call you to schedule this appointment   Your physician recommends that you schedule a follow-up appointment in: 2 weeks with Dr [name]  Please call office at [phone] option 2 if you have any questions or concerns.   At the [facility], you and your health needs are our priority. As part of our continuing mission to provide you with exceptional heart care, we have created designated Provider Care Teams. These Care Teams include your primary Cardiologist (physician) and Advanced Practice Providers (APPs- Physician Assistants and Nurse Practitioners) who all work together to provide you with the care you need, when you need it.   You may see any of the following providers on your designated Care Team at your next follow up: Marland Kitchen Dr Glori Bickers . Dr Loralie Champagne . Darrick Grinder, NP . Lyda Jester, PA . Audry Riles, PharmD   Please be sure to bring in all your medications bottles to every appointment.

## 2020-03-14 NOTE — Progress Notes (Signed)
PCP: [name], MD  Cardiology: [name], MD HF Cardiology: Dr. [name]  84 y.o. with history of chronic systolic CHF (nonischemic cardiomyopathy), chronic LBBB, and mitral regurgitation was referred by Dr. Burt Knack for CHF evaluation prior to Mitraclip placement.   CHF was first noted in 12/20, she was admitted to the hospital in [facility] at that time.  LHC showed nonobstructive CAD.  She later followed up with Dr. Geraldo Pitter and had echo in 3/21, showing EF 35-40% with concern for severe MR.  TEE was then done in 4/21 and I reviewed it today, showing EF 30-35% with septal-lateral dyssynchrony, moderate-severe functional MR, normal RV, severe TR.  She has had a LBBB on ECGs in 2021, she did not have LBBB in 2019.    She returns for followup of CHF.  She reports dyspnea since 12/20.  She is not sure what (if anything) triggered the dyspnea.  At last appointment, I increased her Lasix and started dapagliflozin and digoxin.  She is doing better, weight is down 9 lbs.  She is now able to walk around her apartment without dyspnea but still needed to come into the office in a wheelchair.  BP soft but not lightheaded.    Labs (4/21): K 4.1, creatinine 1.16 Labs (5/21): K 3.8, creatinine 1.21, BNP 2424  ECG (last appt): NSR, LBBB 154 msec   PMH: 1. LBBB: Chronic.  2. Anemia: Prior GI workup in [place] was negative.  No history of overt GI bleeding.  3. Type 2 diabetes 4. Hyperlipidemia 5. CAD: LHC (2020) with 40% ostial left main, 50% mid RCA.  6. Chronic systolic CHF: Noted since 12/20.  Nonischemic cardiomyopathy.  - TEE (4/21): EF 30-35%, septal-lateral dyssynchrony, moderate-severe MR appears functional, normal RV size and systolic function, severe biatrial enlargement, severe TR.  7. GERD 8. Hyperlipidemia 9. Hypothyroidism  Social History   Socioeconomic History  . Marital status: Widowed    Spouse name: Not on file  . Number of children: Not on file  . Years of education: Not  on file  . Highest education level: Not on file  Occupational History  . Not on file  Tobacco Use  . Smoking status: Former Research scientist (life sciences)  . Smokeless tobacco: Never Used  Substance and Sexual Activity  . Alcohol use: No  . Drug use: No  . Sexual activity: Not on file  Other Topics Concern  . Not on file  Social History Narrative  . Not on file   Social Determinants of Health   Financial Resource Strain:   . Difficulty of Paying Living Expenses:   Food Insecurity:   . Worried About Charity fundraiser in the Last Year:   . Arboriculturist in the Last Year:   Transportation Needs:   . Film/video editor (Medical):   Marland Kitchen Lack of Transportation (Non-Medical):   Physical Activity:   . Days of Exercise per Week:   . Minutes of Exercise per Session:   Stress:   . Feeling of Stress :   Social Connections:   . Frequency of Communication with Friends and Family:   . Frequency of Social Gatherings with Friends and Family:   . Attends Religious Services:   . Active Member of Clubs or Organizations:   . Attends Archivist Meetings:   Marland Kitchen Marital Status:   Intimate Partner Violence:   . Fear of Current or Ex-Partner:   . Emotionally Abused:   Marland Kitchen Physically Abused:   . Sexually Abused:  Family History  Problem Relation Age of Onset  . Diabetes Mother   . Heart disease Mother   . Hypertension Mother   . Heart attack Mother   . Emphysema Father   . Hypertension Father   . Heart attack Father   . Breast cancer Sister   . Diabetes Sister   . Emphysema Sister   . COPD Sister   . Asthma Sister   . Hypertension Sister   . Lung cancer Brother   . Diabetes Brother   . Emphysema Brother   . COPD Brother   . Asthma Brother   . Heart disease Brother    ROS: All systems reviewed and negative except as per HPI.   Current Outpatient Medications  Medication Sig Dispense Refill  . albuterol (PROVENTIL) (2.5 MG/3ML) 0.083% nebulizer solution Take 2.5 mg by nebulization every  6 (six) hours as needed for wheezing or shortness of breath.     Marland Kitchen aspirin EC 81 MG tablet Take 1 tablet (81 mg total) by mouth daily. 30 tablet 11  . b complex vitamins tablet Take 1 tablet by mouth daily.    . Calcium Carb-Cholecalciferol (CALCIUM 600 + D PO) Take 1 tablet by mouth daily.    . cetirizine (ZYRTEC) 10 MG tablet Take 10 mg by mouth daily.    . cyclobenzaprine (FLEXERIL) 10 MG tablet Take 10 mg by mouth at bedtime as needed for muscle spasms.     . dapagliflozin propanediol (FARXIGA) 10 MG TABS tablet Take 10 mg by mouth daily before breakfast. 30 tablet 5  . digoxin 62.5 MCG TABS Take 0.0625 mg by mouth daily. 30 tablet 4  . ferrous sulfate 325 (65 FE) MG EC tablet Take 325 mg by mouth 2 (two) times daily.     . fluticasone (FLONASE) 50 MCG/ACT nasal spray Place 2 sprays into both nostrils daily.     . Fluticasone-Salmeterol (ADVAIR) 250-50 MCG/DOSE AEPB Inhale 1 puff into the lungs 2 (two) times daily as needed (shortness of breath).     . furosemide (LASIX) 40 MG tablet Take 1.5 tablets (60 mg total) by mouth 2 (two) times daily. 90 tablet 4  . levalbuterol (XOPENEX HFA) 45 MCG/ACT inhaler Inhale 2 puffs into the lungs every 4 (four) hours as needed for wheezing.    Marland Kitchen levothyroxine (SYNTHROID) 88 MCG tablet Take 88 mcg by mouth daily.    . meclizine (ANTIVERT) 25 MG tablet Take 25 mg by mouth every 8 (eight) hours as needed for dizziness or nausea.     . metFORMIN (GLUCOPHAGE) 500 MG tablet Take 750 mg by mouth 2 (two) times daily.     . metoprolol succinate (TOPROL-XL) 100 MG 24 hr tablet Take 100 mg by mouth daily. Take with or immediately following a meal.     . montelukast (SINGULAIR) 10 MG tablet Take 10 mg by mouth at bedtime.     . naproxen sodium (ALEVE) 220 MG tablet Take 220 mg by mouth daily as needed (pain).    . nitroGLYCERIN (NITROSTAT) 0.4 MG SL tablet Place 1 tablet (0.4 mg total) under the tongue every 5 (five) minutes as needed for chest pain. 30 tablet 2  .  nystatin-triamcinolone (MYCOLOG II) cream Apply 1 application topically 2 (two) times daily as needed (eczema).     . pantoprazole (PROTONIX) 40 MG tablet Take 40 mg by mouth daily.    Vladimir Faster Glycol-Propyl Glycol (SYSTANE OP) Place 1 drop into both eyes 4 (four) times daily.    Marland Kitchen  polyethylene glycol (MIRALAX / GLYCOLAX) packet Take 17 g by mouth daily as needed for mild constipation.     . potassium chloride (KLOR-CON) 10 MEQ tablet Take 2 tablets (20 mEq total) by mouth daily. 60 tablet 4  . rosuvastatin (CRESTOR) 10 MG tablet Take 5 mg by mouth daily.    . Vitamin D, Ergocalciferol, (DRISDOL) 50000 units CAPS capsule Take 50,000 Units by mouth every 14 (fourteen) days.     . vitamin E 400 UNIT capsule Take 400 Units by mouth daily.     Marland Kitchen spironolactone (ALDACTONE) 25 MG tablet Take 0.5 tablets (12.5 mg total) by mouth daily. 45 tablet 3  . Wheat Dextrin (BENEFIBER) POWD Take 1 Dose by mouth daily.     No current facility-administered medications for this encounter.   BP 98/76   Pulse 92   Wt 65.6 kg (144 lb 9.6 oz)   SpO2 98%   BMI 27.32 kg/m  General: NAD Neck: JVP 10-12 cm, no thyromegaly or thyroid nodule.  Lungs: Clear to auscultation bilaterally with normal respiratory effort. CV: Nondisplaced PMI.  Heart regular S1/S2, no S3/S4, 2/6 HSM apex.  No peripheral edema.  No carotid bruit.  Normal pedal pulses.  Abdomen: Soft, nontender, no hepatosplenomegaly, no distention.  Skin: Intact without lesions or rashes.  Neurologic: Alert and oriented x 3.  Psych: Normal affect. Extremities: No clubbing or cyanosis.  HEENT: Normal.   Assessment/Plan: 1. Chronic systolic CHF: Patient has a nonischemic cardiomyopathy of uncertain etiology.  She has significant mitral regurgitation.  This is functional, and I do not think that it explains her cardiomyopathy (though mitral regurgitation likely worsens symptoms).  She has a LBBB that is new since the prior ECG in 2019, cannot rule out LBBB  cardiomyopathy.  Prior myocarditis is also a consideration.  TEE in 4/21 showed EF 30-35% with prominent septal-lateral dyssynchrony. On exam, she remains volume overloaded though improved compared to last appt (9 lb weight loss) with NYHA class III symptoms.  Cardiac medication titration limited by soft BP.  - Increase Lasix to 60 mg bid and add spironolactone 12.5 mg daily.  BMET today and again in 1 week.  - No BP room for Coreg, Entresto just yet.  - Continue digoxin 0.0625 mg daily, check level today.  - Continue dapagliflozin 10 mg daily.  - She has a wide LBBB and septal-lateral dyssynchrony.  As above, cannot rule out LBBB cardiomyopathy.  I think that she is going to need CRT, question will be whether this would be expected to benefit her enough to improve her mitral regurgitation.  I reviewed her ECG with Dr. Lovena Le, think it would be worthwhile to aim for CRT, reassess mitral regurgitation afterwards and proceed with Mitraclip if regurgitation remains significant. Will get her an appt with Dr. Lovena Le.   - Eventual cardiac MRI would be helpful to look for infiltrative disease/myocarditis => will need before CRT placement but would like to see volume status a little better before trying for MRI (will need to lie flat in scanner).  2. CAD: Nonobstructive on 2020 cath.  No chest pain.  - Continue ASA 81 and statin.  3. Mitral regurgitation: I reviewed her TEE.  She has functional mitral regurgitation, moderate-severe/3+ at least on TEE.  Appearance of mitral regurgitation may have been somewhat mitigated by low BP. She does not have a loud murmur on exam. I think that she would be a reasonable Mitraclip candidate.  Would not expect this to fix her cardiomyopathy, but  may help symptoms.  However, question at this point would be whether she would garner enough benefit from CRT to improve her LV function and MR and preclude need for Mitraclip.  Have discussed with Drs Burt Knack and Lovena Le, think it may be  best to aim for CRT first then repeat echo to see if Mitraclip is still needed (think that CRT will be needed regardless).   Followup with me in 2 weeks.   Loralie Champagne 03/14/2020

## 2020-03-20 ENCOUNTER — Other Ambulatory Visit: Payer: Self-pay

## 2020-03-20 DIAGNOSIS — I5020 Unspecified systolic (congestive) heart failure: Secondary | ICD-10-CM

## 2020-03-21 LAB — BASIC METABOLIC PANEL
BUN/Creatinine Ratio: 13 (ref 12–28)
BUN: 14 mg/dL (ref 8–27)
CO2: 24 mmol/L (ref 20–29)
Calcium: 9.8 mg/dL (ref 8.7–10.3)
Chloride: 96 mmol/L (ref 96–106)
Creatinine, Ser: 1.1 mg/dL — ABNORMAL HIGH (ref 0.57–1.00)
GFR calc Af Amer: 54 mL/min/{1.73_m2} — ABNORMAL LOW (ref >59)
GFR calc non Af Amer: 47 mL/min/{1.73_m2} — ABNORMAL LOW (ref >59)
Glucose: 105 mg/dL — ABNORMAL HIGH (ref 65–99)
Potassium: 4.6 mmol/L (ref 3.5–5.2)
Sodium: 137 mmol/L (ref 134–144)

## 2020-03-27 ENCOUNTER — Ambulatory Visit (HOSPITAL_COMMUNITY)
Admission: RE | Admit: 2020-03-27 | Discharge: 2020-03-27 | Disposition: A | Discharge status: Home / Self Care | Payer: Medicare Other | Source: Ambulatory Visit | Attending: Cardiology | Admitting: Cardiology

## 2020-03-27 ENCOUNTER — Encounter (HOSPITAL_COMMUNITY): Payer: Self-pay | Admitting: Cardiology

## 2020-03-27 ENCOUNTER — Other Ambulatory Visit: Payer: Self-pay

## 2020-03-27 VITALS — BP 84/62 | HR 87 | Wt 139.8 lb

## 2020-03-27 DIAGNOSIS — Z87891 Personal history of nicotine dependence: Secondary | ICD-10-CM | POA: Diagnosis not present

## 2020-03-27 DIAGNOSIS — I429 Cardiomyopathy, unspecified: Secondary | ICD-10-CM | POA: Diagnosis not present

## 2020-03-27 DIAGNOSIS — E039 Hypothyroidism, unspecified: Secondary | ICD-10-CM | POA: Diagnosis not present

## 2020-03-27 DIAGNOSIS — Z79890 Hormone replacement therapy (postmenopausal): Secondary | ICD-10-CM | POA: Diagnosis not present

## 2020-03-27 DIAGNOSIS — I340 Nonrheumatic mitral (valve) insufficiency: Secondary | ICD-10-CM

## 2020-03-27 DIAGNOSIS — I2510 Atherosclerotic heart disease of native coronary artery without angina pectoris: Secondary | ICD-10-CM | POA: Insufficient documentation

## 2020-03-27 DIAGNOSIS — Z833 Family history of diabetes mellitus: Secondary | ICD-10-CM | POA: Diagnosis not present

## 2020-03-27 DIAGNOSIS — E119 Type 2 diabetes mellitus without complications: Secondary | ICD-10-CM | POA: Diagnosis not present

## 2020-03-27 DIAGNOSIS — Z79899 Other long term (current) drug therapy: Secondary | ICD-10-CM | POA: Diagnosis not present

## 2020-03-27 DIAGNOSIS — Z7984 Long term (current) use of oral hypoglycemic drugs: Secondary | ICD-10-CM | POA: Diagnosis not present

## 2020-03-27 DIAGNOSIS — I081 Rheumatic disorders of both mitral and tricuspid valves: Secondary | ICD-10-CM | POA: Diagnosis not present

## 2020-03-27 DIAGNOSIS — I5022 Chronic systolic (congestive) heart failure: Secondary | ICD-10-CM

## 2020-03-27 DIAGNOSIS — I447 Left bundle-branch block, unspecified: Secondary | ICD-10-CM | POA: Diagnosis not present

## 2020-03-27 DIAGNOSIS — Z7982 Long term (current) use of aspirin: Secondary | ICD-10-CM | POA: Diagnosis not present

## 2020-03-27 DIAGNOSIS — E785 Hyperlipidemia, unspecified: Secondary | ICD-10-CM | POA: Insufficient documentation

## 2020-03-27 DIAGNOSIS — Z8249 Family history of ischemic heart disease and other diseases of the circulatory system: Secondary | ICD-10-CM | POA: Diagnosis not present

## 2020-03-27 DIAGNOSIS — I428 Other cardiomyopathies: Secondary | ICD-10-CM | POA: Insufficient documentation

## 2020-03-27 NOTE — Patient Instructions (Signed)
No changes to medications!  You have been ordered for an MRI of your heart. You will get a call to schedule this appointment.   Your physician recommends that you schedule a follow-up appointment in: 6 weeks with Dr [name]  Please call office at [phone] option 2 if you have any questions or concerns.   At the [facility], you and your health needs are our priority. As part of our continuing mission to provide you with exceptional heart care, we have created designated Provider Care Teams. These Care Teams include your primary Cardiologist (physician) and Advanced Practice Providers (APPs- Physician Assistants and Nurse Practitioners) who all work together to provide you with the care you need, when you need it.   You may see any of the following providers on your designated Care Team at your next follow up: Marland Kitchen Dr Glori Bickers . Dr Loralie Champagne . Darrick Grinder, NP . Lyda Jester, PA . Audry Riles, PharmD   Please be sure to bring in all your medications bottles to every appointment.

## 2020-03-28 NOTE — Progress Notes (Signed)
PCP: [name], MD  Cardiology: [name], MD HF Cardiology: Dr. [name]  84 y.o. with history of chronic systolic CHF (nonischemic cardiomyopathy), chronic LBBB, and mitral regurgitation was referred by Dr. Burt Knack for CHF evaluation prior to Mitraclip placement.   CHF was first noted in 12/20, she was admitted to the hospital in [facility] at that time.  LHC showed nonobstructive CAD.  She later followed up with Dr. Geraldo Pitter and had echo in 3/21, showing EF 35-40% with concern for severe MR.  TEE was then done in 4/21 and I reviewed it today, showing EF 30-35% with septal-lateral dyssynchrony, moderate-severe functional MR, normal RV, severe TR.  She has had a LBBB on ECGs in 2021, she did not have LBBB in 2019.    She returns for followup of CHF.  Weight is down another 4 lbs.  She has appointment next week with Dr. Lovena Le for CRT consideration.  SBP 80s-90s at home, she is not lightheaded.  No falls.  Walking with cane around the house, breathing continues to improve.  Getting more active.  Still dyspneic with moderate exertion. No orthopnea/PND.  No chest pain.   Labs (4/21): K 4.1, creatinine 1.16 Labs (5/21): K 3.8, creatinine 1.21 => 1.1, BNP 2424 => 2201, digoxin 0.4  ECG (last appt): NSR, LBBB 154 msec (last appt)  PMH: 1. LBBB: Chronic.  2. Anemia: Prior GI workup in [place] was negative.  No history of overt GI bleeding.  3. Type 2 diabetes 4. Hyperlipidemia 5. CAD: LHC (2020) with 40% ostial left main, 50% mid RCA.  6. Chronic systolic CHF: Noted since 12/20.  Nonischemic cardiomyopathy.  - TEE (4/21): EF 30-35%, septal-lateral dyssynchrony, moderate-severe MR appears functional, normal RV size and systolic function, severe biatrial enlargement, severe TR.  7. GERD 8. Hyperlipidemia 9. Hypothyroidism  Social History   Socioeconomic History   Marital status: Widowed    Spouse name: Not on file   Number of children: Not on file   Years of education: Not on file    Highest education level: Not on file  Occupational History   Not on file  Tobacco Use   Smoking status: Former Smoker   Smokeless tobacco: Never Used  Substance and Sexual Activity   Alcohol use: No   Drug use: No   Sexual activity: Not on file  Other Topics Concern   Not on file  Social History Narrative   Not on file   Social Determinants of Health   Financial Resource Strain:    Difficulty of Paying Living Expenses:   Food Insecurity:    Worried About Charity fundraiser in the Last Year:    Arboriculturist in the Last Year:   Transportation Needs:    Film/video editor (Medical):    Lack of Transportation (Non-Medical):   Physical Activity:    Days of Exercise per Week:    Minutes of Exercise per Session:   Stress:    Feeling of Stress :   Social Connections:    Frequency of Communication with Friends and Family:    Frequency of Social Gatherings with Friends and Family:    Attends Religious Services:    Active Member of Clubs or Organizations:    Attends Music therapist:    Marital Status:   Intimate Partner Violence:    Fear of Current or Ex-Partner:    Emotionally Abused:    Physically Abused:    Sexually Abused:    Family History  Problem Relation Age of Onset   Diabetes Mother    Heart disease Mother    Hypertension Mother    Heart attack Mother    Emphysema Father    Hypertension Father    Heart attack Father    Breast cancer Sister    Diabetes Sister    Emphysema Sister    COPD Sister    Asthma Sister    Hypertension Sister    Lung cancer Brother    Diabetes Brother    Emphysema Brother    COPD Brother    Asthma Brother    Heart disease Brother    ROS: All systems reviewed and negative except as per HPI.   Current Outpatient Medications  Medication Sig Dispense Refill   albuterol (PROVENTIL) (2.5 MG/3ML) 0.083% nebulizer solution Take 2.5 mg by nebulization every 6 (six)  hours as needed for wheezing or shortness of breath.      aspirin EC 81 MG tablet Take 1 tablet (81 mg total) by mouth daily. 30 tablet 11   b complex vitamins tablet Take 1 tablet by mouth daily.     Calcium Carb-Cholecalciferol (CALCIUM 600 + D PO) Take 1 tablet by mouth daily.     cetirizine (ZYRTEC) 10 MG tablet Take 10 mg by mouth daily.     cyclobenzaprine (FLEXERIL) 10 MG tablet Take 10 mg by mouth at bedtime as needed for muscle spasms.      dapagliflozin propanediol (FARXIGA) 10 MG TABS tablet Take 10 mg by mouth daily before breakfast. 30 tablet 5   digoxin 62.5 MCG TABS Take 0.0625 mg by mouth daily. 30 tablet 4   ferrous sulfate 325 (65 FE) MG EC tablet Take 325 mg by mouth 2 (two) times daily.      fluticasone (FLONASE) 50 MCG/ACT nasal spray Place 2 sprays into both nostrils daily.      Fluticasone-Salmeterol (ADVAIR) 250-50 MCG/DOSE AEPB Inhale 1 puff into the lungs 2 (two) times daily as needed (shortness of breath).      furosemide (LASIX) 40 MG tablet Take 1.5 tablets (60 mg total) by mouth 2 (two) times daily. 90 tablet 4   levalbuterol (XOPENEX HFA) 45 MCG/ACT inhaler Inhale 2 puffs into the lungs every 4 (four) hours as needed for wheezing.     levothyroxine (SYNTHROID) 88 MCG tablet Take 88 mcg by mouth daily.     meclizine (ANTIVERT) 25 MG tablet Take 25 mg by mouth every 8 (eight) hours as needed for dizziness or nausea.      metFORMIN (GLUCOPHAGE) 500 MG tablet Take 750 mg by mouth 2 (two) times daily.      metoprolol succinate (TOPROL-XL) 100 MG 24 hr tablet Take 100 mg by mouth daily. Take with or immediately following a meal.      montelukast (SINGULAIR) 10 MG tablet Take 10 mg by mouth at bedtime.      naproxen sodium (ALEVE) 220 MG tablet Take 220 mg by mouth daily as needed (pain).     nitroGLYCERIN (NITROSTAT) 0.4 MG SL tablet Place 1 tablet (0.4 mg total) under the tongue every 5 (five) minutes as needed for chest pain. 30 tablet 2    nystatin-triamcinolone (MYCOLOG II) cream Apply 1 application topically 2 (two) times daily as needed (eczema).      pantoprazole (PROTONIX) 40 MG tablet Take 40 mg by mouth daily.     Polyethyl Glycol-Propyl Glycol (SYSTANE OP) Place 1 drop into both eyes 4 (four) times daily.     polyethylene glycol (  MIRALAX / GLYCOLAX) packet Take 17 g by mouth daily as needed for mild constipation.      potassium chloride (KLOR-CON) 10 MEQ tablet Take 2 tablets (20 mEq total) by mouth daily. 60 tablet 4   rosuvastatin (CRESTOR) 10 MG tablet Take 5 mg by mouth daily.     spironolactone (ALDACTONE) 25 MG tablet Take 0.5 tablets (12.5 mg total) by mouth daily. 45 tablet 3   Vitamin D, Ergocalciferol, (DRISDOL) 50000 units CAPS capsule Take 50,000 Units by mouth every 14 (fourteen) days.      vitamin E 400 UNIT capsule Take 400 Units by mouth daily.      Wheat Dextrin (BENEFIBER) POWD Take 1 Dose by mouth daily.     No current facility-administered medications for this encounter.   BP (!) 84/62    Pulse 87    Wt 63.4 kg (139 lb 12.8 oz)    SpO2 97%    BMI 26.41 kg/m  General: NAD Neck: JVP 8 cm, no thyromegaly or thyroid nodule.  Lungs: Clear to auscultation bilaterally with normal respiratory effort. CV: Nondisplaced PMI.  Heart regular S1/S2, no S3/S4, 2/6 HSM apex.  No peripheral edema.  No carotid bruit.  Normal pedal pulses.  Abdomen: Soft, nontender, no hepatosplenomegaly, no distention.  Skin: Intact without lesions or rashes.  Neurologic: Alert and oriented x 3.  Psych: Normal affect. Extremities: No clubbing or cyanosis.  HEENT: Normal.   Assessment/Plan: 1. Chronic systolic CHF: Patient has a nonischemic cardiomyopathy of uncertain etiology.  She has significant mitral regurgitation.  This is functional, and I do not think that it explains her cardiomyopathy (though mitral regurgitation likely worsens symptoms).  She has a LBBB that is new since the prior ECG in 2019, cannot rule out  LBBB cardiomyopathy.  Prior myocarditis is also a consideration.  TEE in 4/21 showed EF 30-35% with prominent septal-lateral dyssynchrony. Volume status looks better today, weight down another 4 lbs.  NYHA class II-III symptoms, improved.  Cardiac medication titration limited by soft BP.  - Continue Lasix 60 mg bid and spironolactone 12.5 mg daily.    - No BP room for Coreg, Entresto. - Continue digoxin 0.0625 mg daily, level ok in 5/21.   - Continue dapagliflozin 10 mg daily.  - She has a wide LBBB and septal-lateral dyssynchrony.  As above, cannot rule out LBBB cardiomyopathy.  I think that she is going to need CRT, question will be whether this would be expected to benefit her enough to improve her mitral regurgitation.  I reviewed her ECG with Dr. Lovena Le, think it would be worthwhile to aim for CRT, reassess mitral regurgitation afterwards and proceed with Mitraclip if regurgitation remains significant. She has appt with Dr. Lovena Le next week.   - I will arrange for cardiac MRI to look for infiltrative disease/myocarditis => will need before CRT placement.  2. CAD: Nonobstructive on 2020 cath.  No chest pain.  - Continue ASA 81 and statin.  3. Mitral regurgitation: I reviewed her TEE.  She has functional mitral regurgitation, moderate-severe/3+ at least on TEE.  Appearance of mitral regurgitation may have been somewhat mitigated by low BP. She does not have a loud murmur on exam. I think that she would be a reasonable Mitraclip candidate.  Would not expect this to fix her cardiomyopathy, but may help symptoms.  However, question at this point would be whether she would garner enough benefit from CRT to improve her LV function and MR and preclude need for Mitraclip.  Have discussed with Drs Burt Knack and Lovena Le, think it may be best to aim for CRT first then repeat echo to see if Mitraclip is still needed (think that CRT will be needed regardless).   Followup 6 wks.   Loralie Champagne 03/28/2020

## 2020-04-02 ENCOUNTER — Ambulatory Visit (INDEPENDENT_AMBULATORY_CARE_PROVIDER_SITE_OTHER): Payer: Medicare Other | Admitting: Internal Medicine

## 2020-04-02 ENCOUNTER — Encounter: Payer: Self-pay | Admitting: Internal Medicine

## 2020-04-02 ENCOUNTER — Other Ambulatory Visit: Payer: Self-pay

## 2020-04-02 DIAGNOSIS — I447 Left bundle-branch block, unspecified: Secondary | ICD-10-CM

## 2020-04-02 DIAGNOSIS — I428 Other cardiomyopathies: Secondary | ICD-10-CM

## 2020-04-02 HISTORY — DX: Other cardiomyopathies: I42.8

## 2020-04-02 HISTORY — DX: Left bundle-branch block, unspecified: I44.7

## 2020-04-02 NOTE — Patient Instructions (Addendum)
Medication Instructions:  Your physician recommends that you continue on your current medications as directed. Please refer to the Current Medication list given to you today.  Labwork: None ordered.  Testing/Procedures: Your physician has recommended that you have a pacemaker inserted. A pacemaker is a small device that is placed under the skin of your chest or abdomen to help control abnormal heart rhythms. This device uses electrical pulses to prompt the heart to beat at a normal rate. Pacemakers are used to treat heart rhythms that are too slow. Wire (leads) are attached to the pacemaker that goes into the chambers of you heart. This is done in the hospital and usually requires and overnight stay. Please see the instruction sheet given to you today for more information.  Follow-Up:  The following dates are available for procedures:  June 18 (?), 28 July 1, 7, 9, 13, 16, 26, 29  Any Other Special Instructions Will Be Listed Below (If Applicable).  If you need a refill on your cardiac medications before your next appointment, please call your pharmacy.    Biventricular Pacemaker Implantation A biventricular pacemaker implantation is a procedure to place (implant) a pacemaker near the heart. A pacemaker is a small, battery-powered device that helps control the heartbeat. If the heart beats irregularly or too slowly (bradycardia), the pacemaker will pace the heart so that it beats at a normal rate or a programmed rate. The parts of a biventricular pacemaker include:  The pulse generator. The pulse generator contains a small computer and a memory system that is programmed to keep the heart beating at a certain rate. The pulse generator also produces the electrical signal that triggers the heart to beat. This is implanted under the skin of the upper chest, near the collarbone.  Wires (leads). There may be two or three leads placed in the heart--one to the right atrium, one to the right  ventricle, and one through the coronary sinus to reach the left ventricle of the heart. The leads are connected to the pulse generator. They transmit electrical pulses from the pulse generator to the heart. A biventricular pacemaker is used in people with heart failure due to weak heart muscles. The pacemaker can help the heart chambers pump more efficiently. This procedure may be done to treat:  Symptoms of severe heart failure, such as shortness of breath (dyspnea).  Loss of consciousness that happens repeatedly (syncope) because of an irregular heart rate. Tell a health care provider about:  Any allergies you have.  All medicines you are taking, including vitamins, herbs, eye drops, creams, and over-the-counter medicines.  Any problems you or family members have had with anesthetic medicines.  Any blood disorders you have.  Any surgeries you have had.  Any medical conditions you have.  Whether you are pregnant or may be pregnant. What are the risks? Generally, this is a safe procedure. However, problems may occur, including:  Infection.  Swelling, bruising, or bleeding at the pacemaker site, especially if you take blood thinners.  Allergic reactions to medicines or dyes.  Damage to blood vessels or nerves near the pacemaker.  Failure of the pacemaker to improve your condition.  Collapsed lung.  Blood clots.  Lead failures. This may require more surgery. What happens before the procedure? Medicines Ask your health care provider about:  Changing or stopping your regular medicines. This is especially important if you are taking diabetes medicines or blood thinners.  Taking medicines such as aspirin and ibuprofen. These medicines can thin your  blood. Do not take these medicines unless your health care provider tells you to take them.  Taking over-the-counter medicines, vitamins, herbs, and supplements. Eating and drinking Follow instructions from your health care  provider about eating and drinking, which may include:  8 hours before the procedure - stop eating heavy meals or foods, such as meat, fried foods, or fatty foods.  6 hours before the procedure - stop eating light meals or foods, such as toast or cereal.  6 hours before the procedure - stop drinking milk or drinks that contain milk.  2 hours before the procedure - stop drinking clear liquids. Staying hydrated Follow instructions from your health care provider about hydration, which may include:  Up to 2 hours before the procedure - you may continue to drink clear liquids, such as water, clear fruit juice, black coffee, and plain tea.  General instructions  Do not use any products that contain nicotine or tobacco for at least 4 weeks before the procedure. These products include cigarettes, e-cigarettes, and chewing tobacco. If you need help quitting, ask your health care provider.  You may have tests, including: ? Blood tests. ? Chest X-rays. ? Echocardiogram. This is a test that uses sound waves (ultrasound) to produce an image of the heart.  Ask your health care provider: ? How your surgery site will be marked. ? What steps will be taken to help prevent infection. These may include:  Removing hair at the surgery site.  Washing skin with a germ-killing soap.  Taking antibiotic medicine.  Plan to have someone take you home from the hospital or clinic.  If you will be going home right after the procedure, plan to have someone with you for 24 hours. What happens during the procedure?   An IV will be inserted into one of your veins.  You will be connected to a heart monitor. Large electrode pads will be placed on the front and back of your chest.  You will be given one or more of the following: ? A medicine to help you relax (sedative). ? A medicine to make you fall asleep (general anesthetic). ? A medicine that is injected into an area of your body to numb the area (local  anesthetic).  An incision will be made in your upper chest, near your heart.  The leads will be guided into your incision, through your blood vessels, and into your heart. Your health care provider will use an X-ray machine (fluoroscope) to guide the leads into your heart.  The leads will be attached to your heart muscles and to the pulse generator.  The leads will be tested to make sure that they work correctly.  The pulse generator will be implanted under your skin, near your incision.  The heart monitor will be watched to ensure that the pacer is working correctly.  Your incision will be closed with stitches (sutures), skin glue, or adhesive tape.  A bandage (dressing) will be placed over your incision. The procedure may vary among health care providers and hospitals. What happens after the procedure?  Your blood pressure, heart rate, breathing rate, and blood oxygen level will be monitored until you leave the hospital or clinic.  You may continue to receive fluids and medicines through an IV.  You will be given pain medicine as needed.  You will have a chest X-ray done. This is to make sure that your pacemaker is in the right place.  You will be given a pacemaker identification card. This  card lists the implant date, device model, and manufacturer of your pacemaker.  Do not drive for 24 hours if you were given a sedative during your procedure. Summary  A pacemaker is a small, battery-powered device that helps control the heartbeat.  A biventricular pacemaker is used in people with heart failure due to weak heart muscles.  Follow instructions from your health care provider about taking medicines and about eating and drinking before the procedure.  You will be given a pacemaker identification card that lists the implant date, device model, and manufacturer of your pacemaker. This information is not intended to replace advice given to you by your health care provider. Make  sure you discuss any questions you have with your health care provider. Document Revised: 09/13/2018 Document Reviewed: 09/13/2018 Elsevier Patient Education  2020 [employer].

## 2020-04-02 NOTE — H&P (View-Only) (Signed)
HPI Vanessa Johnson is referred today by Dr. [name] for evaluation of and consideration for biv PPM insertion. She has class 3 CHF and LBBB with a QRS duration of 155 ms. Her LBBB and worsening CHF symptoms have been present about 2 years. She has had dizzy spells but no syncope. She denies chest pain. No edema. Her EF by echo is 30-35%. She is pending a cMRI. She also has chronic MR and will be considered for mitra clip.  Allergies  Allergen Reactions  . Tussionex Pennkinetic Er [Hydrocod Polst-Cpm Polst Er] Swelling and Rash    Throat swelling  . Cephalexin Swelling    Throat swelling   . Codeine Other (See Comments)    Unknown   . Sulfamethoxazole Other (See Comments)    Unknown  . Cefuroxime Axetil Rash  . Cephalosporins Rash  . Hydrocodone-Chlorpheniramine Rash and Swelling  . Iohexol Rash  . Moxifloxacin Rash     Current Outpatient Medications  Medication Sig Dispense Refill  . albuterol (PROVENTIL) (2.5 MG/3ML) 0.083% nebulizer solution Take 2.5 mg by nebulization every 6 (six) hours as needed for wheezing or shortness of breath.     Marland Kitchen aspirin EC 81 MG tablet Take 1 tablet (81 mg total) by mouth daily. 30 tablet 11  . b complex vitamins tablet Take 1 tablet by mouth daily.    . Calcium Carb-Cholecalciferol (CALCIUM 600 + D PO) Take 1 tablet by mouth daily.    . cetirizine (ZYRTEC) 10 MG tablet Take 10 mg by mouth daily.    . cyclobenzaprine (FLEXERIL) 10 MG tablet Take 10 mg by mouth at bedtime as needed for muscle spasms.     . dapagliflozin propanediol (FARXIGA) 10 MG TABS tablet Take 10 mg by mouth daily before breakfast. 30 tablet 5  . digoxin 62.5 MCG TABS Take 0.0625 mg by mouth daily. 30 tablet 4  . ferrous sulfate 325 (65 FE) MG EC tablet Take 325 mg by mouth 2 (two) times daily.     . fluticasone (FLONASE) 50 MCG/ACT nasal spray Place 2 sprays into both nostrils daily.     . Fluticasone-Salmeterol (ADVAIR) 250-50 MCG/DOSE AEPB Inhale 1 puff into the lungs 2  (two) times daily as needed (shortness of breath).     . furosemide (LASIX) 40 MG tablet Take 1.5 tablets (60 mg total) by mouth 2 (two) times daily. 90 tablet 4  . levalbuterol (XOPENEX HFA) 45 MCG/ACT inhaler Inhale 2 puffs into the lungs every 4 (four) hours as needed for wheezing.    Marland Kitchen levothyroxine (SYNTHROID) 88 MCG tablet Take 88 mcg by mouth daily.    . meclizine (ANTIVERT) 25 MG tablet Take 25 mg by mouth every 8 (eight) hours as needed for dizziness or nausea.     . metFORMIN (GLUCOPHAGE) 500 MG tablet Take 750 mg by mouth 2 (two) times daily.     . metoprolol succinate (TOPROL-XL) 100 MG 24 hr tablet Take 100 mg by mouth daily. Take with or immediately following a meal.     . montelukast (SINGULAIR) 10 MG tablet Take 10 mg by mouth at bedtime.     . naproxen sodium (ALEVE) 220 MG tablet Take 220 mg by mouth daily as needed (pain).    . nitroGLYCERIN (NITROSTAT) 0.4 MG SL tablet Place 1 tablet (0.4 mg total) under the tongue every 5 (five) minutes as needed for chest pain. 30 tablet 2  . nystatin-triamcinolone (MYCOLOG II) cream Apply 1 application topically 2 (two) times daily  as needed (eczema).     . pantoprazole (PROTONIX) 40 MG tablet Take 40 mg by mouth daily.    Vladimir Faster Glycol-Propyl Glycol (SYSTANE OP) Place 1 drop into both eyes 4 (four) times daily.    . polyethylene glycol (MIRALAX / GLYCOLAX) packet Take 17 g by mouth daily as needed for mild constipation.     . potassium chloride (KLOR-CON) 10 MEQ tablet Take 2 tablets (20 mEq total) by mouth daily. 60 tablet 4  . rosuvastatin (CRESTOR) 10 MG tablet Take 5 mg by mouth daily.    Marland Kitchen spironolactone (ALDACTONE) 25 MG tablet Take 0.5 tablets (12.5 mg total) by mouth daily. 45 tablet 3  . Vitamin D, Ergocalciferol, (DRISDOL) 50000 units CAPS capsule Take 50,000 Units by mouth every 14 (fourteen) days.     . vitamin E 400 UNIT capsule Take 400 Units by mouth daily.     . Wheat Dextrin (BENEFIBER) POWD Take 1 Dose by mouth daily.      No current facility-administered medications for this visit.     Past Medical History:  Diagnosis Date  . Allergic rhinitis   . Back pain    left lower back  . Benign essential HTN   . BMI 34.0-34.9,adult   . Bronchial asthma   . Bronchitis 11/20/2018  . CAD (coronary artery disease)    Heart cath 01/2017 [facility]  . Cholelithiasis with cholecystitis without obstruction 12/04/2015  . Diabetes mellitus ([place])   . Diabetes mellitus due to underlying condition with complication, without long-term current use of insulin ([place]) 01/15/2016   Formatting of this note might be different from the original. Added automatically from request for surgery [id number]  . Diabetes mellitus without complication ([place]) 6/73/4193  . Dysfunction of right eustachian tube 11/20/2018  . Essential hypertension 05/17/2017  . Gastritis    Dr. Orlena Sheldon- EGD 2018  . GERD (gastroesophageal reflux disease)   . Hyperlipidemia   . Hypothyroid   . Ischiogluteal bursitis of left side   . Mixed dyslipidemia 05/17/2017  . Obstructive sleep apnea of adult 12/04/2015  . Pre-ulcerative corn or callous   . Sleep apnea    On CPAP  . Tinea pedis of right foot 01/15/2016  . Unsteadiness 08/16/2018   Last Assessment & Plan:  Emergency department follow-up for evaluation of vertigo. Acute onset of vertigo 08/12/2018.  CT head at that time was okay.  Vertiginous symptoms resolved that day.  She persists with some unsteadiness but it is generally improving.  She has chronic history of unsteady that generally is helped by meclizine.  Denies any hearing loss symptoms or ringing in the ears. EXAM sh  . Vitamin D deficiency     ROS:   All systems reviewed and negative except as noted in the HPI.   Past Surgical History:  Procedure Laterality Date  . BREAST BIOPSY  1970, 1980, and 2000  . CATARACT EXTRACTION, BILATERAL  2016  . CHOLECYSTECTOMY  2017  . DILATION AND CURETTAGE OF UTERUS  1996  . [place]  . LEFT HEART CATH AND CORONARY ANGIOGRAPHY  01/2017   [facility]- No intervention  . TEE WITHOUT CARDIOVERSION N/A 02/21/2020   Procedure: TRANSESOPHAGEAL ECHOCARDIOGRAM (TEE);  Surgeon: Dorothy Spark, MD;  Location: [facility] ENDOSCOPY;  Service: Cardiovascular;  Laterality: N/A;  . TONSILLECTOMY  1965  . TOTAL HIP ARTHROPLASTY Bilateral    left- 2005 and right- 2010     Family History  Problem Relation Age  of Onset  . Diabetes Mother   . Heart disease Mother   . Hypertension Mother   . Heart attack Mother   . Emphysema Father   . Hypertension Father   . Heart attack Father   . Breast cancer Sister   . Diabetes Sister   . Emphysema Sister   . COPD Sister   . Asthma Sister   . Hypertension Sister   . Lung cancer Brother   . Diabetes Brother   . Emphysema Brother   . COPD Brother   . Asthma Brother   . Heart disease Brother      Social History   Socioeconomic History  . Marital status: Widowed    Spouse name: Not on file  . Number of children: Not on file  . Years of education: Not on file  . Highest education level: Not on file  Occupational History  . Not on file  Tobacco Use  . Smoking status: Former Research scientist (life sciences)  . Smokeless tobacco: Never Used  Substance and Sexual Activity  . Alcohol use: No  . Drug use: No  . Sexual activity: Not on file  Other Topics Concern  . Not on file  Social History Narrative  . Not on file   Social Determinants of Health   Financial Resource Strain:   . Difficulty of Paying Living Expenses:   Food Insecurity:   . Worried About Charity fundraiser in the Last Year:   . Arboriculturist in the Last Year:   Transportation Needs:   . Film/video editor (Medical):   Marland Kitchen Lack of Transportation (Non-Medical):   Physical Activity:   . Days of Exercise per Week:   . Minutes of Exercise per Session:   Stress:   . Feeling of Stress :   Social Connections:   . Frequency of Communication with Friends  and Family:   . Frequency of Social Gatherings with Friends and Family:   . Attends Religious Services:   . Active Member of Clubs or Organizations:   . Attends Archivist Meetings:   Marland Kitchen Marital Status:   Intimate Partner Violence:   . Fear of Current or Ex-Partner:   . Emotionally Abused:   Marland Kitchen Physically Abused:   . Sexually Abused:      BP 91/68   Pulse 92   Ht 5\' 1"  (1.549 m)   Wt 137 lb (62.1 kg)   BMI 25.89 kg/m   Physical Exam:  Well appearing NAD HEENT: Unremarkable Neck:  No JVD, no thyromegally Lymphatics:  No adenopathy Back:  No CVA tenderness Lungs:  Clear with no wheezes HEART:  Regular rate rhythm, 2/6 systolic murmurs, no rubs, no clicks Abd:  soft, positive bowel sounds, no organomegally, no rebound, no guarding Ext:  2 plus pulses, no edema, no cyanosis, no clubbing Skin:  No rashes no nodules Neuro:  CN II through XII intact, motor grossly intact  EKG - nsr with LBBB   Assess/Plan: 1. Chronic systolic heart failure - she has class 3 symptoms despite maximal medical therapy. She is unable to tolerate an ACE/ARB due to hypotension. I have discussed the indications for biv PPM insertion and she will call us if she wishes to proceed.  Mikle Bosworth.D

## 2020-04-02 NOTE — Progress Notes (Signed)
HPI Vanessa Johnson is referred today by Dr. [name] for evaluation of and consideration for biv PPM insertion. She has class 3 CHF and LBBB with a QRS duration of 155 ms. Her LBBB and worsening CHF symptoms have been present about 2 years. She has had dizzy spells but no syncope. She denies chest pain. No edema. Her EF by echo is 30-35%. She is pending a cMRI. She also has chronic MR and will be considered for mitra clip.  Allergies  Allergen Reactions  . Tussionex Pennkinetic Er [Hydrocod Polst-Cpm Polst Er] Swelling and Rash    Throat swelling  . Cephalexin Swelling    Throat swelling   . Codeine Other (See Comments)    Unknown   . Sulfamethoxazole Other (See Comments)    Unknown  . Cefuroxime Axetil Rash  . Cephalosporins Rash  . Hydrocodone-Chlorpheniramine Rash and Swelling  . Iohexol Rash  . Moxifloxacin Rash     Current Outpatient Medications  Medication Sig Dispense Refill  . albuterol (PROVENTIL) (2.5 MG/3ML) 0.083% nebulizer solution Take 2.5 mg by nebulization every 6 (six) hours as needed for wheezing or shortness of breath.     Marland Kitchen aspirin EC 81 MG tablet Take 1 tablet (81 mg total) by mouth daily. 30 tablet 11  . b complex vitamins tablet Take 1 tablet by mouth daily.    . Calcium Carb-Cholecalciferol (CALCIUM 600 + D PO) Take 1 tablet by mouth daily.    . cetirizine (ZYRTEC) 10 MG tablet Take 10 mg by mouth daily.    . cyclobenzaprine (FLEXERIL) 10 MG tablet Take 10 mg by mouth at bedtime as needed for muscle spasms.     . dapagliflozin propanediol (FARXIGA) 10 MG TABS tablet Take 10 mg by mouth daily before breakfast. 30 tablet 5  . digoxin 62.5 MCG TABS Take 0.0625 mg by mouth daily. 30 tablet 4  . ferrous sulfate 325 (65 FE) MG EC tablet Take 325 mg by mouth 2 (two) times daily.     . fluticasone (FLONASE) 50 MCG/ACT nasal spray Place 2 sprays into both nostrils daily.     . Fluticasone-Salmeterol (ADVAIR) 250-50 MCG/DOSE AEPB Inhale 1 puff into the lungs 2  (two) times daily as needed (shortness of breath).     . furosemide (LASIX) 40 MG tablet Take 1.5 tablets (60 mg total) by mouth 2 (two) times daily. 90 tablet 4  . levalbuterol (XOPENEX HFA) 45 MCG/ACT inhaler Inhale 2 puffs into the lungs every 4 (four) hours as needed for wheezing.    Marland Kitchen levothyroxine (SYNTHROID) 88 MCG tablet Take 88 mcg by mouth daily.    . meclizine (ANTIVERT) 25 MG tablet Take 25 mg by mouth every 8 (eight) hours as needed for dizziness or nausea.     . metFORMIN (GLUCOPHAGE) 500 MG tablet Take 750 mg by mouth 2 (two) times daily.     . metoprolol succinate (TOPROL-XL) 100 MG 24 hr tablet Take 100 mg by mouth daily. Take with or immediately following a meal.     . montelukast (SINGULAIR) 10 MG tablet Take 10 mg by mouth at bedtime.     . naproxen sodium (ALEVE) 220 MG tablet Take 220 mg by mouth daily as needed (pain).    . nitroGLYCERIN (NITROSTAT) 0.4 MG SL tablet Place 1 tablet (0.4 mg total) under the tongue every 5 (five) minutes as needed for chest pain. 30 tablet 2  . nystatin-triamcinolone (MYCOLOG II) cream Apply 1 application topically 2 (two) times daily  as needed (eczema).     . pantoprazole (PROTONIX) 40 MG tablet Take 40 mg by mouth daily.    Vladimir Faster Glycol-Propyl Glycol (SYSTANE OP) Place 1 drop into both eyes 4 (four) times daily.    . polyethylene glycol (MIRALAX / GLYCOLAX) packet Take 17 g by mouth daily as needed for mild constipation.     . potassium chloride (KLOR-CON) 10 MEQ tablet Take 2 tablets (20 mEq total) by mouth daily. 60 tablet 4  . rosuvastatin (CRESTOR) 10 MG tablet Take 5 mg by mouth daily.    Marland Kitchen spironolactone (ALDACTONE) 25 MG tablet Take 0.5 tablets (12.5 mg total) by mouth daily. 45 tablet 3  . Vitamin D, Ergocalciferol, (DRISDOL) 50000 units CAPS capsule Take 50,000 Units by mouth every 14 (fourteen) days.     . vitamin E 400 UNIT capsule Take 400 Units by mouth daily.     . Wheat Dextrin (BENEFIBER) POWD Take 1 Dose by mouth daily.      No current facility-administered medications for this visit.     Past Medical History:  Diagnosis Date  . Allergic rhinitis   . Back pain    left lower back  . Benign essential HTN   . BMI 34.0-34.9,adult   . Bronchial asthma   . Bronchitis 11/20/2018  . CAD (coronary artery disease)    Heart cath 01/2017 [facility]  . Cholelithiasis with cholecystitis without obstruction 12/04/2015  . Diabetes mellitus ([place])   . Diabetes mellitus due to underlying condition with complication, without long-term current use of insulin ([place]) 01/15/2016   Formatting of this note might be different from the original. Added automatically from request for surgery [id number]  . Diabetes mellitus without complication ([place]) 7/37/1062  . Dysfunction of right eustachian tube 11/20/2018  . Essential hypertension 05/17/2017  . Gastritis    Dr. Orlena Sheldon- EGD 2018  . GERD (gastroesophageal reflux disease)   . Hyperlipidemia   . Hypothyroid   . Ischiogluteal bursitis of left side   . Mixed dyslipidemia 05/17/2017  . Obstructive sleep apnea of adult 12/04/2015  . Pre-ulcerative corn or callous   . Sleep apnea    On CPAP  . Tinea pedis of right foot 01/15/2016  . Unsteadiness 08/16/2018   Last Assessment & Plan:  Emergency department follow-up for evaluation of vertigo. Acute onset of vertigo 08/12/2018.  CT head at that time was okay.  Vertiginous symptoms resolved that day.  She persists with some unsteadiness but it is generally improving.  She has chronic history of unsteady that generally is helped by meclizine.  Denies any hearing loss symptoms or ringing in the ears. EXAM sh  . Vitamin D deficiency     ROS:   All systems reviewed and negative except as noted in the HPI.   Past Surgical History:  Procedure Laterality Date  . BREAST BIOPSY  1970, 1980, and 2000  . CATARACT EXTRACTION, BILATERAL  2016  . CHOLECYSTECTOMY  2017  . DILATION AND CURETTAGE OF UTERUS  1996  . [place]  . LEFT HEART CATH AND CORONARY ANGIOGRAPHY  01/2017   [facility]- No intervention  . TEE WITHOUT CARDIOVERSION N/A 02/21/2020   Procedure: TRANSESOPHAGEAL ECHOCARDIOGRAM (TEE);  Surgeon: Dorothy Spark, MD;  Location: [facility] ENDOSCOPY;  Service: Cardiovascular;  Laterality: N/A;  . TONSILLECTOMY  1965  . TOTAL HIP ARTHROPLASTY Bilateral    left- 2005 and right- 2010     Family History  Problem Relation Age  of Onset  . Diabetes Mother   . Heart disease Mother   . Hypertension Mother   . Heart attack Mother   . Emphysema Father   . Hypertension Father   . Heart attack Father   . Breast cancer Sister   . Diabetes Sister   . Emphysema Sister   . COPD Sister   . Asthma Sister   . Hypertension Sister   . Lung cancer Brother   . Diabetes Brother   . Emphysema Brother   . COPD Brother   . Asthma Brother   . Heart disease Brother      Social History   Socioeconomic History  . Marital status: Widowed    Spouse name: Not on file  . Number of children: Not on file  . Years of education: Not on file  . Highest education level: Not on file  Occupational History  . Not on file  Tobacco Use  . Smoking status: Former Research scientist (life sciences)  . Smokeless tobacco: Never Used  Substance and Sexual Activity  . Alcohol use: No  . Drug use: No  . Sexual activity: Not on file  Other Topics Concern  . Not on file  Social History Narrative  . Not on file   Social Determinants of Health   Financial Resource Strain:   . Difficulty of Paying Living Expenses:   Food Insecurity:   . Worried About Charity fundraiser in the Last Year:   . Arboriculturist in the Last Year:   Transportation Needs:   . Film/video editor (Medical):   Marland Kitchen Lack of Transportation (Non-Medical):   Physical Activity:   . Days of Exercise per Week:   . Minutes of Exercise per Session:   Stress:   . Feeling of Stress :   Social Connections:   . Frequency of Communication with Friends  and Family:   . Frequency of Social Gatherings with Friends and Family:   . Attends Religious Services:   . Active Member of Clubs or Organizations:   . Attends Archivist Meetings:   Marland Kitchen Marital Status:   Intimate Partner Violence:   . Fear of Current or Ex-Partner:   . Emotionally Abused:   Marland Kitchen Physically Abused:   . Sexually Abused:      BP 91/68   Pulse 92   Ht 5\' 1"  (1.549 m)   Wt 137 lb (62.1 kg)   BMI 25.89 kg/m   Physical Exam:  Well appearing NAD HEENT: Unremarkable Neck:  No JVD, no thyromegally Lymphatics:  No adenopathy Back:  No CVA tenderness Lungs:  Clear with no wheezes HEART:  Regular rate rhythm, 2/6 systolic murmurs, no rubs, no clicks Abd:  soft, positive bowel sounds, no organomegally, no rebound, no guarding Ext:  2 plus pulses, no edema, no cyanosis, no clubbing Skin:  No rashes no nodules Neuro:  CN II through XII intact, motor grossly intact  EKG - nsr with LBBB   Assess/Plan: 1. Chronic systolic heart failure - she has class 3 symptoms despite maximal medical therapy. She is unable to tolerate an ACE/ARB due to hypotension. I have discussed the indications for biv PPM insertion and she will call us if she wishes to proceed.  Mikle Bosworth.D

## 2020-04-03 ENCOUNTER — Telehealth (HOSPITAL_COMMUNITY): Payer: Self-pay | Admitting: Pharmacy Technician

## 2020-04-03 ENCOUNTER — Encounter: Payer: Self-pay | Admitting: *Deleted

## 2020-04-03 ENCOUNTER — Telehealth: Payer: Self-pay | Admitting: *Deleted

## 2020-04-03 NOTE — Telephone Encounter (Signed)
Spoke with patient regarding appointment for Cardiac MRI scheduled Wednesday 04/09/20 at 12:00pm--arrival time is 11:15 am 1st floor admissions office.  Patient voiced her understanding.

## 2020-04-03 NOTE — Telephone Encounter (Signed)
Patient Advocate Encounter   Received notification from [employer] that prior authorization for Wilder Glade is required.   PA submitted on CoverMyMeds Key [id number] Status is pending   Will continue to follow.

## 2020-04-07 ENCOUNTER — Telehealth (HOSPITAL_COMMUNITY): Payer: Self-pay | Admitting: Pharmacist

## 2020-04-07 DIAGNOSIS — E785 Hyperlipidemia, unspecified: Secondary | ICD-10-CM | POA: Diagnosis not present

## 2020-04-07 DIAGNOSIS — E119 Type 2 diabetes mellitus without complications: Secondary | ICD-10-CM | POA: Diagnosis not present

## 2020-04-07 DIAGNOSIS — I10 Essential (primary) hypertension: Secondary | ICD-10-CM | POA: Diagnosis not present

## 2020-04-07 DIAGNOSIS — E559 Vitamin D deficiency, unspecified: Secondary | ICD-10-CM | POA: Diagnosis not present

## 2020-04-07 MED ORDER — EMPAGLIFLOZIN 10 MG PO TABS: 10.0000 mg | ORAL_TABLET | Freq: Every day | ORAL | 11 refills | Status: DC

## 2020-04-07 NOTE — Telephone Encounter (Signed)
Prior Authorization for Vanessa Johnson remains denied. Insurance prefers [name]. Called and made patient aware. [facility] will send Jardiance to express scripts, per patients request.  Charlann Boxer, CPhT

## 2020-04-07 NOTE — Telephone Encounter (Signed)
Patient's insurance does not cover [place]. Will have her stop [place] and start Jardiance 10 mg daily. Patient expressed understanding.  Audry Riles, PharmD, BCPS, BCCP, CPP Heart Failure Clinic Pharmacist [phone]

## 2020-04-07 NOTE — Telephone Encounter (Signed)
Prior Authorization was denied, called and requested a redetermination. The representative reached out to the Prior Authorization team and they are going to send over another form to fill out.  Will follow up.

## 2020-04-08 ENCOUNTER — Telehealth (HOSPITAL_COMMUNITY): Payer: Self-pay | Admitting: *Deleted

## 2020-04-08 NOTE — Telephone Encounter (Signed)
Reaching out to patient to offer assistance regarding upcoming cardiac imaging study; pt verbalizes understanding of appt date/time, parking situation and where to check in, and verified current allergies; name and call back number provided for further questions should they arise ° °Senya Hinzman Tai RN Navigator Cardiac Imaging °[facility] °[phone] office °[phone] cell °

## 2020-04-09 ENCOUNTER — Ambulatory Visit (HOSPITAL_COMMUNITY)
Admission: RE | Admit: 2020-04-09 | Discharge: 2020-04-09 | Disposition: A | Discharge status: Home / Self Care | Payer: Medicare Other | Source: Ambulatory Visit | Attending: Cardiology | Admitting: Cardiology

## 2020-04-09 ENCOUNTER — Other Ambulatory Visit: Payer: Self-pay

## 2020-04-09 DIAGNOSIS — I429 Cardiomyopathy, unspecified: Secondary | ICD-10-CM | POA: Diagnosis not present

## 2020-04-09 MED ORDER — GADOBUTROL 1 MMOL/ML IV SOLN
10.0000 mL | Freq: Once | INTRAVENOUS | Status: AC | PRN
  Administered 2020-04-09: 10 mL via INTRAVENOUS

## 2020-04-15 ENCOUNTER — Telehealth: Payer: Self-pay

## 2020-04-15 DIAGNOSIS — I428 Other cardiomyopathies: Secondary | ICD-10-CM

## 2020-04-15 DIAGNOSIS — I447 Left bundle-branch block, unspecified: Secondary | ICD-10-CM

## 2020-04-15 NOTE — Telephone Encounter (Signed)
Outreach made to Pt.  Advised Pt of MRI results.  Gave upcoming dates for pacemaker.  Pt will discuss with children, will call Pt back tomorrow.

## 2020-04-16 NOTE — Telephone Encounter (Signed)
-----   Message from Larey Dresser, MD sent at 04/14/2020 10:10 PM EDT ----- EF 27% by MRI, moderate RV dysfunction.  Dyssynchrony that BiV pacemaker may be able to help.  Only moderate MR.  Strongly recommend that she call Dr. Lovena Le for BiV pacemaker placement (he asked her to call back when ready).

## 2020-04-16 NOTE — Telephone Encounter (Signed)
Patient has chosen to go with the first week of July. She said she needed the times again. Please call patient at your earliest convenience

## 2020-04-17 NOTE — Addendum Note (Signed)
Addended by: Willeen Cass A on: 04/17/2020 02:22 PM   Modules accepted: Orders

## 2020-04-17 NOTE — Telephone Encounter (Signed)
Returned call to Pt.  Pt would like to schedule biv ppm for April 24, 2020   Will discuss with daughter in regards to timing of labs

## 2020-04-18 ENCOUNTER — Other Ambulatory Visit: Payer: Medicare Other | Admitting: *Deleted

## 2020-04-18 ENCOUNTER — Other Ambulatory Visit: Payer: Self-pay

## 2020-04-18 DIAGNOSIS — I428 Other cardiomyopathies: Secondary | ICD-10-CM

## 2020-04-18 DIAGNOSIS — I447 Left bundle-branch block, unspecified: Secondary | ICD-10-CM

## 2020-04-18 LAB — CBC WITH DIFFERENTIAL/PLATELET
Basophils Absolute: 0.1 10*3/uL (ref 0.0–0.2)
Basos: 1 %
EOS (ABSOLUTE): 0.2 10*3/uL (ref 0.0–0.4)
Eos: 2 %
Hematocrit: 34.3 % (ref 34.0–46.6)
Hemoglobin: 10.9 g/dL — ABNORMAL LOW (ref 11.1–15.9)
Immature Grans (Abs): 0 10*3/uL (ref 0.0–0.1)
Immature Granulocytes: 0 %
Lymphocytes Absolute: 2.6 10*3/uL (ref 0.7–3.1)
Lymphs: 29 %
MCH: 23.1 pg — ABNORMAL LOW (ref 26.6–33.0)
MCHC: 31.8 g/dL (ref 31.5–35.7)
MCV: 73 fL — ABNORMAL LOW (ref 79–97)
Monocytes Absolute: 0.8 10*3/uL (ref 0.1–0.9)
Monocytes: 9 %
Neutrophils Absolute: 5.4 10*3/uL (ref 1.4–7.0)
Neutrophils: 59 %
Platelets: 476 10*3/uL — ABNORMAL HIGH (ref 150–450)
RBC: 4.71 x10E6/uL (ref 3.77–5.28)
RDW: 17.5 % — ABNORMAL HIGH (ref 11.7–15.4)
WBC: 9 10*3/uL (ref 3.4–10.8)

## 2020-04-18 LAB — BASIC METABOLIC PANEL
BUN/Creatinine Ratio: 27 (ref 12–28)
BUN: 32 mg/dL — ABNORMAL HIGH (ref 8–27)
CO2: 22 mmol/L (ref 20–29)
Calcium: 10.2 mg/dL (ref 8.7–10.3)
Chloride: 96 mmol/L (ref 96–106)
Creatinine, Ser: 1.17 mg/dL — ABNORMAL HIGH (ref 0.57–1.00)
GFR calc Af Amer: 50 mL/min/{1.73_m2} — ABNORMAL LOW (ref >59)
GFR calc non Af Amer: 43 mL/min/{1.73_m2} — ABNORMAL LOW (ref >59)
Glucose: 110 mg/dL — ABNORMAL HIGH (ref 65–99)
Potassium: 4.6 mmol/L (ref 3.5–5.2)
Sodium: 137 mmol/L (ref 134–144)

## 2020-04-24 ENCOUNTER — Other Ambulatory Visit: Payer: Self-pay

## 2020-04-24 ENCOUNTER — Ambulatory Visit (HOSPITAL_COMMUNITY)
Admission: RE | Disposition: A | Discharge status: Home / Self Care | Payer: Self-pay | Source: Home / Self Care | Attending: Internal Medicine

## 2020-04-24 ENCOUNTER — Ambulatory Visit (HOSPITAL_COMMUNITY): Payer: Medicare Other

## 2020-04-24 ENCOUNTER — Ambulatory Visit (HOSPITAL_COMMUNITY)
Admission: RE | Admit: 2020-04-24 | Discharge: 2020-04-24 | Disposition: A | Discharge status: Home / Self Care | Payer: Medicare Other | Attending: Internal Medicine | Admitting: Internal Medicine

## 2020-04-24 DIAGNOSIS — Z7984 Long term (current) use of oral hypoglycemic drugs: Secondary | ICD-10-CM | POA: Diagnosis not present

## 2020-04-24 DIAGNOSIS — Z882 Allergy status to sulfonamides status: Secondary | ICD-10-CM | POA: Insufficient documentation

## 2020-04-24 DIAGNOSIS — I2510 Atherosclerotic heart disease of native coronary artery without angina pectoris: Secondary | ICD-10-CM | POA: Insufficient documentation

## 2020-04-24 DIAGNOSIS — I429 Cardiomyopathy, unspecified: Secondary | ICD-10-CM | POA: Diagnosis not present

## 2020-04-24 DIAGNOSIS — Z885 Allergy status to narcotic agent status: Secondary | ICD-10-CM | POA: Diagnosis not present

## 2020-04-24 DIAGNOSIS — I428 Other cardiomyopathies: Secondary | ICD-10-CM

## 2020-04-24 DIAGNOSIS — K219 Gastro-esophageal reflux disease without esophagitis: Secondary | ICD-10-CM | POA: Diagnosis not present

## 2020-04-24 DIAGNOSIS — I5022 Chronic systolic (congestive) heart failure: Secondary | ICD-10-CM

## 2020-04-24 DIAGNOSIS — I110 Hypertensive heart disease with heart failure: Secondary | ICD-10-CM | POA: Diagnosis not present

## 2020-04-24 DIAGNOSIS — E039 Hypothyroidism, unspecified: Secondary | ICD-10-CM | POA: Insufficient documentation

## 2020-04-24 DIAGNOSIS — E782 Mixed hyperlipidemia: Secondary | ICD-10-CM | POA: Insufficient documentation

## 2020-04-24 DIAGNOSIS — Z87891 Personal history of nicotine dependence: Secondary | ICD-10-CM | POA: Insufficient documentation

## 2020-04-24 DIAGNOSIS — Z881 Allergy status to other antibiotic agents status: Secondary | ICD-10-CM | POA: Diagnosis not present

## 2020-04-24 DIAGNOSIS — Z950 Presence of cardiac pacemaker: Secondary | ICD-10-CM

## 2020-04-24 DIAGNOSIS — Z7982 Long term (current) use of aspirin: Secondary | ICD-10-CM | POA: Diagnosis not present

## 2020-04-24 DIAGNOSIS — Z79899 Other long term (current) drug therapy: Secondary | ICD-10-CM | POA: Insufficient documentation

## 2020-04-24 DIAGNOSIS — Z79890 Hormone replacement therapy (postmenopausal): Secondary | ICD-10-CM | POA: Insufficient documentation

## 2020-04-24 DIAGNOSIS — G4733 Obstructive sleep apnea (adult) (pediatric): Secondary | ICD-10-CM | POA: Diagnosis not present

## 2020-04-24 DIAGNOSIS — E119 Type 2 diabetes mellitus without complications: Secondary | ICD-10-CM | POA: Diagnosis not present

## 2020-04-24 DIAGNOSIS — I447 Left bundle-branch block, unspecified: Secondary | ICD-10-CM | POA: Insufficient documentation

## 2020-04-24 HISTORY — PX: BIV PACEMAKER INSERTION CRT-P: EP1199

## 2020-04-24 LAB — GLUCOSE, CAPILLARY: Glucose-Capillary: 108 mg/dL — ABNORMAL HIGH (ref 70–99)

## 2020-04-24 SURGERY — BIV PACEMAKER INSERTION CRT-P

## 2020-04-24 MED ORDER — DIPHENHYDRAMINE HCL 50 MG PO CAPS
50.0000 mg | ORAL_CAPSULE | Freq: Once | ORAL | Status: AC
  Administered 2020-04-24: 50 mg via ORAL
  Filled 2020-04-24: qty 1

## 2020-04-24 MED ORDER — DIPHENHYDRAMINE HCL 50 MG/ML IJ SOLN
INTRAMUSCULAR | Status: AC
  Filled 2020-04-24: qty 1

## 2020-04-24 MED ORDER — METHYLPREDNISOLONE SODIUM SUCC 125 MG IJ SOLR: 125.0000 mg | INTRAMUSCULAR | Status: DC

## 2020-04-24 MED ORDER — DIPHENHYDRAMINE HCL 25 MG PO CAPS
ORAL_CAPSULE | ORAL | Status: AC
  Filled 2020-04-24: qty 2

## 2020-04-24 MED ORDER — FENTANYL CITRATE (PF) 100 MCG/2ML IJ SOLN
INTRAMUSCULAR | Status: DC | PRN
  Administered 2020-04-24: 25 ug via INTRAVENOUS

## 2020-04-24 MED ORDER — CHLORHEXIDINE GLUCONATE 4 % EX LIQD
4.0000 "application " | Freq: Once | CUTANEOUS | Status: DC
  Filled 2020-04-24: qty 60

## 2020-04-24 MED ORDER — VANCOMYCIN HCL IN DEXTROSE 1-5 GM/200ML-% IV SOLN
1000.0000 mg | INTRAVENOUS | Status: AC
  Administered 2020-04-24: 1000 mg via INTRAVENOUS

## 2020-04-24 MED ORDER — SODIUM CHLORIDE 0.9 % IV SOLN
80.0000 mg | INTRAVENOUS | Status: AC
  Administered 2020-04-24: 80 mg

## 2020-04-24 MED ORDER — ONDANSETRON HCL 4 MG/2ML IJ SOLN: 4.0000 mg | Freq: Four times a day (QID) | INTRAMUSCULAR | Status: DC | PRN

## 2020-04-24 MED ORDER — LIDOCAINE HCL (PF) 1 % IJ SOLN
INTRAMUSCULAR | Status: DC | PRN
  Administered 2020-04-24: 60 mL

## 2020-04-24 MED ORDER — CEFAZOLIN SODIUM-DEXTROSE 2-4 GM/100ML-% IV SOLN
INTRAVENOUS | Status: AC
  Filled 2020-04-24: qty 100

## 2020-04-24 MED ORDER — VANCOMYCIN HCL IN DEXTROSE 1-5 GM/200ML-% IV SOLN
INTRAVENOUS | Status: AC
  Filled 2020-04-24: qty 200

## 2020-04-24 MED ORDER — METHYLPREDNISOLONE SODIUM SUCC 125 MG IJ SOLR
125.0000 mg | Freq: Once | INTRAMUSCULAR | Status: AC
  Administered 2020-04-24: 125 mg via INTRAVENOUS

## 2020-04-24 MED ORDER — ACETAMINOPHEN 325 MG PO TABS: 325.0000 mg | ORAL_TABLET | ORAL | Status: DC | PRN

## 2020-04-24 MED ORDER — HEPARIN (PORCINE) IN NACL 1000-0.9 UT/500ML-% IV SOLN
INTRAVENOUS | Status: DC | PRN
  Administered 2020-04-24: 500 mL

## 2020-04-24 MED ORDER — HEPARIN (PORCINE) IN NACL 1000-0.9 UT/500ML-% IV SOLN
INTRAVENOUS | Status: AC
  Filled 2020-04-24: qty 500

## 2020-04-24 MED ORDER — SODIUM CHLORIDE 0.9 % IV SOLN: INTRAVENOUS | Status: DC

## 2020-04-24 MED ORDER — LIDOCAINE HCL 1 % IJ SOLN
INTRAMUSCULAR | Status: AC
  Filled 2020-04-24: qty 60

## 2020-04-24 MED ORDER — MIDAZOLAM HCL 5 MG/5ML IJ SOLN
INTRAMUSCULAR | Status: DC | PRN
  Administered 2020-04-24: 2 mg via INTRAVENOUS

## 2020-04-24 MED ORDER — VANCOMYCIN HCL IN DEXTROSE 1-5 GM/200ML-% IV SOLN
1000.0000 mg | Freq: Once | INTRAVENOUS | Status: AC
  Administered 2020-04-24: 1000 mg via INTRAVENOUS
  Filled 2020-04-24: qty 200

## 2020-04-24 MED ORDER — SODIUM CHLORIDE 0.9 % IV SOLN
INTRAVENOUS | Status: AC
  Filled 2020-04-24: qty 2

## 2020-04-24 MED ORDER — MIDAZOLAM HCL 5 MG/5ML IJ SOLN
INTRAMUSCULAR | Status: AC
  Filled 2020-04-24: qty 5

## 2020-04-24 MED ORDER — FENTANYL CITRATE (PF) 100 MCG/2ML IJ SOLN
INTRAMUSCULAR | Status: AC
  Filled 2020-04-24: qty 2

## 2020-04-24 MED ORDER — METHYLPREDNISOLONE SODIUM SUCC 125 MG IJ SOLR
INTRAMUSCULAR | Status: AC
  Filled 2020-04-24: qty 2

## 2020-04-24 SURGICAL SUPPLY — 13 items
CABLE SURGICAL S-101-97-12 (CABLE) ×3 IMPLANT
CATH CPS DIRECT 135 DS2C020 (CATHETERS) ×3 IMPLANT
CATH HEX JOS 2-5-2 65CM 6F REP (CATHETERS) ×3 IMPLANT
CPS IMPLANT KIT 410190 (MISCELLANEOUS) ×3 IMPLANT
LEAD QUARTET 1458Q-86CM (Lead) ×3 IMPLANT
LEAD TENDRIL MRI 46CM LPA1200M (Lead) ×3 IMPLANT
LEAD TENDRIL MRI 52CM LPA1200M (Lead) ×3 IMPLANT
PACEMAKER QUDR ALLR CRT PM3562 (Pacemaker) ×1 IMPLANT
PAD PRO RADIOLUCENT 2001M-C (PAD) ×3 IMPLANT
PMKR QUADRA ALLURE CRT PM3562 (Pacemaker) ×3 IMPLANT
SHEATH 8FR PRELUDE SNAP 13 (SHEATH) ×6 IMPLANT
TRAY PACEMAKER INSERTION (PACKS) ×3 IMPLANT
WIRE ACUITY WHISPER EDS 4648 (WIRE) ×3 IMPLANT

## 2020-04-24 NOTE — Interval H&P Note (Signed)
History and Physical Interval Note:  04/24/2020 9:08 AM  Vanessa Johnson  has presented today for surgery, with the diagnosis of cardiomyopathy.  The various methods of treatment have been discussed with the patient and family. After consideration of risks, benefits and other options for treatment, the patient has consented to  Procedure(s): BIV PACEMAKER INSERTION CRT-P (N/A) as a surgical intervention.  The patient's history has been reviewed, patient examined, no change in status, stable for surgery.  I have reviewed the patient's chart and labs.  Questions were answered to the patient's satisfaction.     Cristopher Peru

## 2020-04-24 NOTE — Discharge Instructions (Signed)
° ° °  Supplemental Discharge Instructions for  Pacemaker/Defibrillator Patients  Tomorrow, 04/25/2020, PLEASE SEND A REMOTE DEVICE TRANSMISSION     Activity No heavy lifting or vigorous activity with your left/right arm for 6 to 8 weeks.  Do not raise your left/right arm above your head for one week.  Gradually raise your affected arm as drawn below.              04/28/2020                  04/29/2020                  04/30/2020                05/01/2020 __  NO DRIVING for  1 week   ; you may begin driving on  0/06/4075   .  WOUND CARE - Keep the wound area clean and dry.  Do not get this area wet for one week. No showers for one week; you may shower on 05/01/2020  . - Tomorrow, 04/25/2020, remove the arm sling - Tomorrow, 04/25/2020, remove the outer plastic bandage.  Underneath there are steri-strips (little pieces of paper tapes) covering the wound.  DO NOT remove these - The tape/steri-strips on your wound will fall off; do not pull them off.  No bandage is needed on the site.  DO  NOT apply any creams, oils, or ointments to the wound area. - If you notice any drainage or discharge from the wound, any swelling or bruising at the site, or you develop a fever > 101? F after you are discharged home, call the office at once.  Special Instructions - You are still able to use cellular telephones; use the ear opposite the side where you have your pacemaker/defibrillator.  Avoid carrying your cellular phone near your device. - When traveling through airports, show security personnel your identification card to avoid being screened in the metal detectors.  Ask the security personnel to use the hand wand. - Avoid arc welding equipment, MRI testing (magnetic resonance imaging), TENS units (transcutaneous nerve stimulators).  Call the office for questions about other devices. - Avoid electrical appliances that are in poor condition or are not properly grounded. - Microwave ovens are safe to be near or to  operate.

## 2020-04-24 NOTE — Progress Notes (Signed)
Patient and daughter was given discharge instructions. Both verbalized understanding. 

## 2020-04-25 ENCOUNTER — Encounter (HOSPITAL_COMMUNITY): Payer: Self-pay | Admitting: Internal Medicine

## 2020-04-25 MED FILL — Lidocaine HCl Local Inj 1%: INTRAMUSCULAR | Qty: 60 | Status: AC

## 2020-04-26 ENCOUNTER — Telehealth: Payer: Self-pay | Admitting: Cardiology

## 2020-04-26 NOTE — Telephone Encounter (Signed)
Pt with rash around pacer site, at edges of steristrips.  She will wipe with cool cloth not on incision but around and che can try cortisone cream - again not on incision but around and she could try po benadryl. If symptoms do not improve she will call back.

## 2020-05-06 ENCOUNTER — Other Ambulatory Visit: Payer: Self-pay

## 2020-05-06 ENCOUNTER — Ambulatory Visit (INDEPENDENT_AMBULATORY_CARE_PROVIDER_SITE_OTHER): Payer: Medicare Other | Admitting: Emergency Medicine

## 2020-05-06 DIAGNOSIS — I428 Other cardiomyopathies: Secondary | ICD-10-CM | POA: Diagnosis not present

## 2020-05-06 LAB — CUP PACEART INCLINIC DEVICE CHECK
Battery Remaining Longevity: 55 mo
Battery Voltage: 2.99 V
Brady Statistic RA Percent Paced: 0.02 %
Brady Statistic RV Percent Paced: 99.95 %
Date Time Interrogation Session: 20210713152414
Implantable Lead Implant Date: 20210701
Implantable Lead Implant Date: 20210701
Implantable Lead Implant Date: 20210701
Implantable Lead Location: 753858
Implantable Lead Location: 753859
Implantable Lead Location: 753860
Implantable Pulse Generator Implant Date: 20210701
Lead Channel Impedance Value: 437.5 Ohm
Lead Channel Impedance Value: 600 Ohm
Lead Channel Impedance Value: 800 Ohm
Lead Channel Pacing Threshold Amplitude: 0.5 V
Lead Channel Pacing Threshold Amplitude: 0.5 V
Lead Channel Pacing Threshold Amplitude: 1 V
Lead Channel Pacing Threshold Pulse Width: 0.5 ms
Lead Channel Pacing Threshold Pulse Width: 0.5 ms
Lead Channel Pacing Threshold Pulse Width: 0.5 ms
Lead Channel Sensing Intrinsic Amplitude: 12 mV
Lead Channel Sensing Intrinsic Amplitude: 4.9 mV
Lead Channel Setting Pacing Amplitude: 3.5 V
Lead Channel Setting Pacing Amplitude: 3.5 V
Lead Channel Setting Pacing Amplitude: 3.5 V
Lead Channel Setting Pacing Pulse Width: 0.5 ms
Lead Channel Setting Pacing Pulse Width: 0.5 ms
Lead Channel Setting Sensing Sensitivity: 2 mV
Pulse Gen Model: 3562
Pulse Gen Serial Number: 3818336

## 2020-05-06 NOTE — Progress Notes (Signed)
CRT-P device/wound check in clinic. Wound check appointment. Steri-strips removed. Wound without redness or edema. Incision edges approximated, wound well healed. Patient educated about wound care, arm mobility, lifting restrictions, shock plan. ROV in 3 months with implanting physician. Normal device function. Device programmed at 3.5V for extra safety margin until 3 month visit. Thresholds, sensing, impedance consistent with previous measurements. Histograms appropriate for patient and level of activity. No mode switches or ventricular high rate episodes. Patient bi-ventricularly pacing >99% of the time. Device programmed with appropriate safety margins. Device heart failure diagnostics are within normal limits and stable over time. Estimated longevity 4.3-4.6 years. Patient enrolled in remote follow-up 07/24/20. Patient education provided on home remote box pairing, packet given to patient. Plan to check device remotely in 3 months and every 6 months in office. ROV w/ Dr. Lovena Le 08/05/20.

## 2020-05-07 ENCOUNTER — Telehealth: Payer: Self-pay | Admitting: Internal Medicine

## 2020-05-07 NOTE — Telephone Encounter (Signed)
New Message:   Daughter called and wanted if you had received pt's transmissions?

## 2020-05-07 NOTE — Telephone Encounter (Signed)
Confirmed remote transmission received 05/06/20 at 1613.

## 2020-05-08 ENCOUNTER — Encounter (HOSPITAL_COMMUNITY): Payer: Self-pay | Admitting: Cardiology

## 2020-05-08 ENCOUNTER — Other Ambulatory Visit (HOSPITAL_COMMUNITY): Payer: Self-pay | Admitting: *Deleted

## 2020-05-08 ENCOUNTER — Other Ambulatory Visit: Payer: Self-pay

## 2020-05-08 ENCOUNTER — Ambulatory Visit (HOSPITAL_COMMUNITY)
Admission: RE | Admit: 2020-05-08 | Discharge: 2020-05-08 | Disposition: A | Discharge status: Home / Self Care | Payer: Medicare Other | Source: Ambulatory Visit | Attending: Cardiology | Admitting: Cardiology

## 2020-05-08 VITALS — BP 98/72 | HR 86 | Wt 138.0 lb

## 2020-05-08 DIAGNOSIS — I447 Left bundle-branch block, unspecified: Secondary | ICD-10-CM | POA: Diagnosis not present

## 2020-05-08 DIAGNOSIS — E039 Hypothyroidism, unspecified: Secondary | ICD-10-CM | POA: Insufficient documentation

## 2020-05-08 DIAGNOSIS — Z79899 Other long term (current) drug therapy: Secondary | ICD-10-CM | POA: Insufficient documentation

## 2020-05-08 DIAGNOSIS — Z833 Family history of diabetes mellitus: Secondary | ICD-10-CM | POA: Insufficient documentation

## 2020-05-08 DIAGNOSIS — I428 Other cardiomyopathies: Secondary | ICD-10-CM | POA: Insufficient documentation

## 2020-05-08 DIAGNOSIS — I5022 Chronic systolic (congestive) heart failure: Secondary | ICD-10-CM | POA: Diagnosis not present

## 2020-05-08 DIAGNOSIS — K219 Gastro-esophageal reflux disease without esophagitis: Secondary | ICD-10-CM | POA: Diagnosis not present

## 2020-05-08 DIAGNOSIS — Z87891 Personal history of nicotine dependence: Secondary | ICD-10-CM | POA: Insufficient documentation

## 2020-05-08 DIAGNOSIS — Z7982 Long term (current) use of aspirin: Secondary | ICD-10-CM | POA: Insufficient documentation

## 2020-05-08 DIAGNOSIS — E119 Type 2 diabetes mellitus without complications: Secondary | ICD-10-CM | POA: Diagnosis not present

## 2020-05-08 DIAGNOSIS — Z79890 Hormone replacement therapy (postmenopausal): Secondary | ICD-10-CM | POA: Diagnosis not present

## 2020-05-08 DIAGNOSIS — I2510 Atherosclerotic heart disease of native coronary artery without angina pectoris: Secondary | ICD-10-CM | POA: Diagnosis not present

## 2020-05-08 DIAGNOSIS — Z7984 Long term (current) use of oral hypoglycemic drugs: Secondary | ICD-10-CM | POA: Diagnosis not present

## 2020-05-08 DIAGNOSIS — Z8249 Family history of ischemic heart disease and other diseases of the circulatory system: Secondary | ICD-10-CM | POA: Insufficient documentation

## 2020-05-08 DIAGNOSIS — E785 Hyperlipidemia, unspecified: Secondary | ICD-10-CM | POA: Diagnosis not present

## 2020-05-08 DIAGNOSIS — I5020 Unspecified systolic (congestive) heart failure: Secondary | ICD-10-CM | POA: Diagnosis not present

## 2020-05-08 LAB — BASIC METABOLIC PANEL
Anion gap: 13 (ref 5–15)
BUN: 27 mg/dL — ABNORMAL HIGH (ref 8–23)
CO2: 23 mmol/L (ref 22–32)
Calcium: 9.8 mg/dL (ref 8.9–10.3)
Chloride: 99 mmol/L (ref 98–111)
Creatinine, Ser: 1.1 mg/dL — ABNORMAL HIGH (ref 0.44–1.00)
GFR calc Af Amer: 54 mL/min — ABNORMAL LOW (ref >60)
GFR calc non Af Amer: 47 mL/min — ABNORMAL LOW (ref >60)
Glucose, Bld: 105 mg/dL — ABNORMAL HIGH (ref 70–99)
Potassium: 4.4 mmol/L (ref 3.5–5.1)
Sodium: 135 mmol/L (ref 135–145)

## 2020-05-08 LAB — FERRITIN: Ferritin: 16 ng/mL (ref 11–307)

## 2020-05-08 LAB — IRON AND TIBC
Iron: 31 ug/dL (ref 28–170)
Saturation Ratios: 6 % — ABNORMAL LOW (ref 10.4–31.8)
TIBC: 517 ug/dL — ABNORMAL HIGH (ref 250–450)
UIBC: 486 ug/dL

## 2020-05-08 LAB — DIGOXIN LEVEL: Digoxin Level: 0.8 ng/mL (ref 0.8–2.0)

## 2020-05-08 MED ORDER — POTASSIUM CHLORIDE ER 10 MEQ PO TBCR: 10.0000 meq | EXTENDED_RELEASE_TABLET | Freq: Every day | ORAL | 4 refills | Status: DC

## 2020-05-08 MED ORDER — SPIRONOLACTONE 25 MG PO TABS
25.0000 mg | ORAL_TABLET | Freq: Every day | ORAL | 3 refills | Status: DC
Start: 2020-05-08 — End: 2021-04-15

## 2020-05-08 NOTE — Patient Instructions (Signed)
INCREASE Spironolactone to 25mg  daily at bedtime  DECREASE Potassium to 54meq daily  Routine lab work today. Will notify you of abnormal results   Repeat labs in 10days  Follow up in 2 months with echo   You have been referred to Cardiac Rehab at [facility]  (they will contact you to schedule)

## 2020-05-08 NOTE — Progress Notes (Signed)
PCP: [name], MD  Cardiology: [name], MD HF Cardiology: Dr. [name]  84 y.o. with history of chronic systolic CHF (nonischemic cardiomyopathy), chronic LBBB, and mitral regurgitation was referred by Dr. Burt Knack for CHF evaluation prior to Mitraclip placement.   CHF was first noted in 12/20, she was admitted to the hospital in [facility] at that time.  LHC showed nonobstructive CAD.  She later followed up with Dr. Geraldo Pitter and had echo in 3/21, showing EF 35-40% with concern for severe MR.  TEE was then done in 4/21 and I reviewed it today, showing EF 30-35% with septal-lateral dyssynchrony, moderate-severe functional MR, normal RV, severe TR.  She has had a LBBB on ECGs in 2021, she did not have LBBB in 2019.    Cardiac MRI in 6/21 showed moderate LV dilation, EF 27%, moderately decreased RV function with EF 31%, no LGE, probably moderate MR.   She had St Jude CRT-P device implanted in 7/21.   She returns for followup of CHF.  Weight is down 1 lb.  She says that she feels better since CRT device placement.  She remains short of breath after walking about 200 feet.  No problems walking around the grocery store as long as she uses a buggy.  No orthopnea/PND.  No chest pain.  Occasional lightheadedness if she stands too fast.    Labs (4/21): K 4.1, creatinine 1.16 Labs (5/21): K 3.8, creatinine 1.21 => 1.1, BNP 2424 => 2201, digoxin 0.4 Labs (6/21): K 4.6, creatinine 1.17, hgb 10.9  PMH: 1. LBBB: Chronic.  2. Anemia: Prior GI workup in [place] was negative.  No history of overt GI bleeding.  3. Type 2 diabetes 4. Hyperlipidemia 5. CAD: LHC (2020) with 40% ostial left main, 50% mid RCA.  6. Chronic systolic CHF: Noted since 12/20.  Nonischemic cardiomyopathy.  - TEE (4/21): EF 30-35%, septal-lateral dyssynchrony, moderate-severe MR appears functional, normal RV size and systolic function, severe biatrial enlargement, severe TR.  - Cardiac MRI (6/21): Moderate LV dilation, EF 27%;  moderately decreased RV function with EF 31%, no LGE, probably moderate MR.  - St Jude CRT-P implanted 7/21.  7. GERD 8. Hyperlipidemia 9. Hypothyroidism  Social History   Socioeconomic History   Marital status: Widowed    Spouse name: Not on file   Number of children: Not on file   Years of education: Not on file   Highest education level: Not on file  Occupational History   Not on file  Tobacco Use   Smoking status: Former Smoker   Smokeless tobacco: Never Used  Scientific laboratory technician Use: Never used  Substance and Sexual Activity   Alcohol use: No   Drug use: No   Sexual activity: Not on file  Other Topics Concern   Not on file  Social History Narrative   Not on file   Social Determinants of Health   Financial Resource Strain:    Difficulty of Paying Living Expenses:   Food Insecurity:    Worried About Charity fundraiser in the Last Year:    Arboriculturist in the Last Year:   Transportation Needs:    Film/video editor (Medical):    Lack of Transportation (Non-Medical):   Physical Activity:    Days of Exercise per Week:    Minutes of Exercise per Session:   Stress:    Feeling of Stress :   Social Connections:    Frequency of Communication with Friends and Family:  Frequency of Social Gatherings with Friends and Family:    Attends Religious Services:    Active Member of Clubs or Organizations:    Attends Music therapist:    Marital Status:   Intimate Partner Violence:    Fear of Current or Ex-Partner:    Emotionally Abused:    Physically Abused:    Sexually Abused:    Family History  Problem Relation Age of Onset   Diabetes Mother    Heart disease Mother    Hypertension Mother    Heart attack Mother    Emphysema Father    Hypertension Father    Heart attack Father    Breast cancer Sister    Diabetes Sister    Emphysema Sister    COPD Sister    Asthma Sister    Hypertension Sister     Lung cancer Brother    Diabetes Brother    Emphysema Brother    COPD Brother    Asthma Brother    Heart disease Brother    ROS: All systems reviewed and negative except as per HPI.   Current Outpatient Medications  Medication Sig Dispense Refill   albuterol (PROVENTIL) (2.5 MG/3ML) 0.083% nebulizer solution Take 2.5 mg by nebulization every 6 (six) hours as needed for wheezing or shortness of breath.      aspirin EC 81 MG tablet Take 1 tablet (81 mg total) by mouth daily. 30 tablet 11   b complex vitamins tablet Take 1 tablet by mouth daily.     Calcium Carb-Cholecalciferol (CALCIUM 600 + D PO) Take 1 tablet by mouth daily.     cetirizine (ZYRTEC) 10 MG tablet Take 10 mg by mouth daily.     cyclobenzaprine (FLEXERIL) 10 MG tablet Take 10 mg by mouth at bedtime as needed for muscle spasms.      digoxin 62.5 MCG TABS Take 0.0625 mg by mouth daily. (Patient taking differently: Take 62.5 mcg by mouth daily. ) 30 tablet 4   empagliflozin (JARDIANCE) 10 MG TABS tablet Take 1 tablet (10 mg total) by mouth daily before breakfast. 30 tablet 11   ferrous sulfate 325 (65 FE) MG EC tablet Take 325 mg by mouth 2 (two) times daily as needed (low iron).      fluticasone (FLONASE) 50 MCG/ACT nasal spray Place 2 sprays into both nostrils daily.      Fluticasone-Salmeterol (ADVAIR) 250-50 MCG/DOSE AEPB Inhale 1 puff into the lungs 2 (two) times daily as needed (shortness of breath).      furosemide (LASIX) 20 MG tablet Take 20 mg by mouth 2 (two) times daily. Take with 40 mg to equal 60 mg twice daily     furosemide (LASIX) 40 MG tablet Take 1.5 tablets (60 mg total) by mouth 2 (two) times daily. (Patient taking differently: Take 40 mg by mouth 2 (two) times daily. Take with 20 mg to equal 60 mg twice daily) 90 tablet 4   levalbuterol (XOPENEX HFA) 45 MCG/ACT inhaler Inhale 2 puffs into the lungs every 4 (four) hours as needed for wheezing.     levothyroxine (SYNTHROID) 88 MCG tablet  Take 88 mcg by mouth daily.     meclizine (ANTIVERT) 25 MG tablet Take 25 mg by mouth every 8 (eight) hours as needed for dizziness or nausea.      metFORMIN (GLUCOPHAGE) 500 MG tablet Take 750 mg by mouth 2 (two) times daily.      metoprolol succinate (TOPROL-XL) 100 MG 24 hr tablet Take 100 mg  by mouth daily. Take with or immediately following a meal.      montelukast (SINGULAIR) 10 MG tablet Take 10 mg by mouth at bedtime.      naproxen sodium (ALEVE) 220 MG tablet Take 440 mg by mouth daily as needed (pain).      nitroGLYCERIN (NITROSTAT) 0.4 MG SL tablet Place 1 tablet (0.4 mg total) under the tongue every 5 (five) minutes as needed for chest pain. 30 tablet 2   nystatin-triamcinolone (MYCOLOG II) cream Apply 1 application topically 2 (two) times daily as needed (eczema).      Omega-3 Fatty Acids (FISH OIL) 1000 MG CAPS Take 2,000 mg by mouth daily.     pantoprazole (PROTONIX) 40 MG tablet Take 40 mg by mouth daily.     Polyethyl Glycol-Propyl Glycol (SYSTANE OP) Place 1 drop into both eyes 4 (four) times daily.     polyethylene glycol (MIRALAX / GLYCOLAX) packet Take 17 g by mouth daily as needed for mild constipation.      potassium chloride (KLOR-CON) 10 MEQ tablet Take 1 tablet (10 mEq total) by mouth daily. 30 tablet 4   rosuvastatin (CRESTOR) 10 MG tablet Take 5 mg by mouth daily.     spironolactone (ALDACTONE) 25 MG tablet Take 1 tablet (25 mg total) by mouth at bedtime. 90 tablet 3   Vitamin D, Ergocalciferol, (DRISDOL) 50000 units CAPS capsule Take 50,000 Units by mouth every 14 (fourteen) days.      vitamin E 400 UNIT capsule Take 400 Units by mouth daily.      Wheat Dextrin (BENEFIBER) POWD Take 1 Scoop by mouth daily as needed (constipation).      No current facility-administered medications for this encounter.   BP 98/72    Pulse 86    Wt 62.6 kg (138 lb)    SpO2 97%    BMI 26.07 kg/m  General: NAD Neck: No JVD, no thyromegaly or thyroid nodule.  Lungs:  Clear to auscultation bilaterally with normal respiratory effort. CV: Nondisplaced PMI.  Heart regular S1/S2, no S3/S4, no murmur.  No peripheral edema.  No carotid bruit.  Normal pedal pulses.  Abdomen: Soft, nontender, no hepatosplenomegaly, no distention.  Skin: Intact without lesions or rashes.  Neurologic: Alert and oriented x 3.  Psych: Normal affect. Extremities: No clubbing or cyanosis.  HEENT: Normal.   Assessment/Plan: 1. Chronic systolic CHF: Patient has a nonischemic cardiomyopathy of uncertain etiology.  She has significant mitral regurgitation.  This is functional, and I do not think that it explains her cardiomyopathy (though mitral regurgitation likely worsens symptoms).  She has a LBBB that is new since the prior ECG in 2019, cannot rule out LBBB cardiomyopathy.  Prior myocarditis is also a consideration.  TEE in 4/21 showed EF 30-35% with prominent septal-lateral dyssynchrony.  Cardiac MRI in 6/21 showed moderate LV dilation, EF 27%, moderately decreased RV function with EF 31%, no LGE, probably moderate MR.  St Jude CRT-P device implanted in 6/21.  NYHA class II symptoms, not volume overloaded on exam.  BP remains soft, making medication titration difficult.  - Continue Lasix 60 mg bid.    - Increase spironolactone to 25 mg, take at bedtime.  Can decrease KCl to 10 daily.  BMET today and in 10 days.  - No BP room for Coreg, Entresto. - Continue digoxin 0.0625 mg daily, check level.  - Continue dapagliflozin 10 mg daily.  - Followup in 2 months with echo to assess response to CRT.  - I will  refer her to cardiac rehab at [facility].  2. CAD: Nonobstructive on 2020 cath.  No chest pain.  - Continue ASA 81 and statin.  3. Mitral regurgitation: I reviewed her TEE.  She has functional mitral regurgitation, moderate-severe/3+ at least on TEE.  Appearance of mitral regurgitation may have been somewhat mitigated by low BP. She does not have a loud murmur on exam. I think that she  would be a reasonable Mitraclip candidate.  Would not expect this to fix her cardiomyopathy, but may help symptoms.  However, question at this point would be whether she would garner enough benefit from CRT to improve her LV function and MR and preclude need for Mitraclip.  Have discussed with Drs Burt Knack and Lovena Le, We decided to aim for CRT first then repeat echo to see if Mitraclip is still needed.   See clinical pharmacist in 1 month for medication titration, see me in 2 months with echo.   Loralie Champagne 05/08/2020

## 2020-05-09 ENCOUNTER — Telehealth (HOSPITAL_COMMUNITY): Payer: Self-pay | Admitting: *Deleted

## 2020-05-09 NOTE — Telephone Encounter (Signed)
Outpatient cardiac rehab referral faxed to [facility] at [phone]

## 2020-05-14 ENCOUNTER — Other Ambulatory Visit (HOSPITAL_COMMUNITY): Payer: Self-pay | Admitting: *Deleted

## 2020-05-14 MED ORDER — DIGOXIN 62.5 MCG PO TABS
0.0625 mg | ORAL_TABLET | Freq: Every day | ORAL | 4 refills | Status: DC
Start: 2020-05-14 — End: 2020-07-23

## 2020-05-15 ENCOUNTER — Telehealth (HOSPITAL_COMMUNITY): Payer: Self-pay | Admitting: *Deleted

## 2020-05-15 NOTE — Telephone Encounter (Signed)
Pt called yesterday to let us know she has not heard from her PCP regarding the Feraheme infusion.  Called Dr Lauralee Evener office and spoke w/Angela on 7/21, she stated she would look into this and call me back.  Spoke w/Angela today, she states they will sch pt for infusion and [facility], they have her labs we had faxed to them as well.

## 2020-05-16 DIAGNOSIS — Z6825 Body mass index (BMI) 25.0-25.9, adult: Secondary | ICD-10-CM | POA: Diagnosis not present

## 2020-05-16 DIAGNOSIS — D509 Iron deficiency anemia, unspecified: Secondary | ICD-10-CM | POA: Diagnosis not present

## 2020-05-20 DIAGNOSIS — D509 Iron deficiency anemia, unspecified: Secondary | ICD-10-CM | POA: Diagnosis not present

## 2020-05-27 ENCOUNTER — Other Ambulatory Visit: Payer: Self-pay

## 2020-05-27 ENCOUNTER — Encounter: Payer: Self-pay | Admitting: Cardiology

## 2020-05-27 ENCOUNTER — Ambulatory Visit (INDEPENDENT_AMBULATORY_CARE_PROVIDER_SITE_OTHER): Payer: Medicare Other | Admitting: Cardiology

## 2020-05-27 VITALS — BP 144/64 | HR 84 | Ht 61.0 in | Wt 135.2 lb

## 2020-05-27 DIAGNOSIS — I2510 Atherosclerotic heart disease of native coronary artery without angina pectoris: Secondary | ICD-10-CM | POA: Diagnosis not present

## 2020-05-27 DIAGNOSIS — I10 Essential (primary) hypertension: Secondary | ICD-10-CM | POA: Diagnosis not present

## 2020-05-27 DIAGNOSIS — E782 Mixed hyperlipidemia: Secondary | ICD-10-CM

## 2020-05-27 DIAGNOSIS — E088 Diabetes mellitus due to underlying condition with unspecified complications: Secondary | ICD-10-CM | POA: Diagnosis not present

## 2020-05-27 DIAGNOSIS — I428 Other cardiomyopathies: Secondary | ICD-10-CM | POA: Diagnosis not present

## 2020-05-27 DIAGNOSIS — D509 Iron deficiency anemia, unspecified: Secondary | ICD-10-CM | POA: Diagnosis not present

## 2020-05-27 NOTE — Patient Instructions (Signed)

## 2020-05-27 NOTE — Progress Notes (Signed)
Cardiology Office Note:    Date:  05/27/2020   ID:  Vanessa Johnson, DOB [birth year], MRN [id number]  PCP:  [name], MD  Cardiologist:  [name], MD   Referring MD: [name], MD    ASSESSMENT:    1. Coronary artery disease involving native coronary artery of native heart without angina pectoris   2. Essential hypertension   3. Nonischemic cardiomyopathy ([place])   4. Diabetes mellitus due to underlying condition with complication, without long-term current use of insulin ([place])   5. Mixed dyslipidemia    PLAN:    In order of problems listed above:  1. Chronic systolic congestive heart failure: The patient mentions to me that she feels much better now interestingly she mentioned to me that she has felt better even before the CRT device was implanted.  She is also receiving iron and ferritin transfusions for anemia. 2. Essential hypertension: Blood pressure stable 3. Nonischemic cardiomyopathy: Patient is on guideline directed medical therapy.  She had blood work at [facility] and I reviewed that. 4. Diabetes mellitus and mixed dyslipidemia: Diet was emphasized weight reduction has been obtained by the patient.Patient will be seen in follow-up appointment in 3 months or earlier if the patient has any concerns in the interim she will also be followed by her valvular heart disease clinic for the possibility of a mitral clip in the future.    Medication Adjustments/Labs and Tests Ordered: Current medicines are reviewed at length with the patient today.  Concerns regarding medicines are outlined above.  No orders of the defined types were placed in this encounter.  No orders of the defined types were placed in this encounter.    No chief complaint on file.    History of Present Illness:    Vanessa Johnson is a 84 y.o. female.  Patient has history of chronic systolic congestive heart failure.  She has left bundle branch block and prior EKGs.  She underwent CRT-P  therapy and is feeling much better.  She is on diuretic therapy also.  She was considered for mitral clip and is under the care of her valvular and structural heart disease with the same.  She tells me that she has felt the best she has felt in a long time.  At the time of my evaluation, the patient is alert awake oriented and in no distress.  Past Medical History:  Diagnosis Date  . Allergic rhinitis   . Back pain    left lower back  . Benign essential HTN   . BMI 34.0-34.9,adult   . Bronchial asthma   . Bronchitis 11/20/2018  . CAD (coronary artery disease)    [facility]  . Cholelithiasis with cholecystitis without obstruction 12/04/2015  . Diabetes mellitus ([place])   . Diabetes mellitus due to underlying condition with complication, without long-term current use of insulin ([place]) 01/15/2016   Formatting of this note might be different from the original. Added automatically from request for surgery [id number]  . Diabetes mellitus without complication ([place]) 5/40/0867  . Dysfunction of right eustachian tube 11/20/2018  . Essential hypertension 05/17/2017  . Gastritis    Dr. Orlena Sheldon- EGD 2018  . GERD (gastroesophageal reflux disease)   . Hyperlipidemia   . Hypothyroid   . Ischiogluteal bursitis of left side   . Mixed dyslipidemia 05/17/2017  . Obstructive sleep apnea of adult 12/04/2015  . Pre-ulcerative corn or callous   . Sleep apnea    On CPAP  . Tinea  pedis of right foot 01/15/2016  . Unsteadiness 08/16/2018   Last Assessment & Plan:  Emergency department follow-up for evaluation of vertigo. Acute onset of vertigo 08/12/2018.  CT head at that time was okay.  Vertiginous symptoms resolved that day.  She persists with some unsteadiness but it is generally improving.  She has chronic history of unsteady that generally is helped by meclizine.  Denies any hearing loss symptoms or ringing in the ears. EXAM sh  . Vitamin D deficiency     Past Surgical History:  Procedure  Laterality Date  . BIV PACEMAKER INSERTION CRT-P N/A 04/24/2020   Procedure: BIV PACEMAKER INSERTION CRT-P;  Surgeon: Evans Lance, MD;  Location: [facility];  Service: Cardiovascular;  Laterality: N/A;  . [place], and 2000  . CATARACT EXTRACTION, BILATERAL  2016  . CHOLECYSTECTOMY  2017  . DILATION AND CURETTAGE OF UTERUS  1996  . [place]  . LEFT HEART CATH AND CORONARY ANGIOGRAPHY  01/2017   [facility]- No intervention  . TEE WITHOUT CARDIOVERSION N/A 02/21/2020   Procedure: TRANSESOPHAGEAL ECHOCARDIOGRAM (TEE);  Surgeon: Dorothy Spark, MD;  Location: [facility] ENDOSCOPY;  Service: Cardiovascular;  Laterality: N/A;  . TONSILLECTOMY  1965  . TOTAL HIP ARTHROPLASTY Bilateral    left- 2005 and right- 2010    Current Medications: Current Meds  Medication Sig  . albuterol (PROVENTIL) (2.5 MG/3ML) 0.083% nebulizer solution Take 2.5 mg by nebulization every 6 (six) hours as needed for wheezing or shortness of breath.   Marland Kitchen aspirin EC 81 MG tablet Take 1 tablet (81 mg total) by mouth daily.  Marland Kitchen b complex vitamins tablet Take 1 tablet by mouth daily.  . Calcium Carb-Cholecalciferol (CALCIUM 600 + D PO) Take 1 tablet by mouth daily.  . cetirizine (ZYRTEC) 10 MG tablet Take 10 mg by mouth daily.  . cyclobenzaprine (FLEXERIL) 10 MG tablet Take 10 mg by mouth at bedtime as needed for muscle spasms.   . Digoxin 62.5 MCG TABS Take 0.0625 mg by mouth daily.  . empagliflozin (JARDIANCE) 10 MG TABS tablet Take 1 tablet (10 mg total) by mouth daily before breakfast.  . ferrous sulfate 325 (65 FE) MG EC tablet Take 325 mg by mouth 2 (two) times daily as needed (low iron).   . fluticasone (FLONASE) 50 MCG/ACT nasal spray Place 2 sprays into both nostrils daily.   . Fluticasone-Salmeterol (ADVAIR) 250-50 MCG/DOSE AEPB Inhale 1 puff into the lungs 2 (two) times daily as needed (shortness of breath).   . furosemide (LASIX) 20 MG  tablet Take 20 mg by mouth 2 (two) times daily. Take with 40 mg to equal 60 mg twice daily  . furosemide (LASIX) 40 MG tablet Take 1.5 tablets (60 mg total) by mouth 2 (two) times daily. (Patient taking differently: Take 40 mg by mouth 2 (two) times daily. Take with 20 mg to equal 60 mg twice daily)  . levalbuterol (XOPENEX HFA) 45 MCG/ACT inhaler Inhale 2 puffs into the lungs every 4 (four) hours as needed for wheezing.  Marland Kitchen levothyroxine (SYNTHROID) 88 MCG tablet Take 88 mcg by mouth daily.  . meclizine (ANTIVERT) 25 MG tablet Take 25 mg by mouth every 8 (eight) hours as needed for dizziness or nausea.   . metFORMIN (GLUCOPHAGE) 500 MG tablet Take 750 mg by mouth 2 (two) times daily.   . metoprolol succinate (TOPROL-XL) 100 MG 24 hr tablet Take 100 mg by mouth daily. Take  with or immediately following a meal.   . montelukast (SINGULAIR) 10 MG tablet Take 10 mg by mouth at bedtime.   . naproxen sodium (ALEVE) 220 MG tablet Take 440 mg by mouth daily as needed (pain).   . nitroGLYCERIN (NITROSTAT) 0.4 MG SL tablet Place 1 tablet (0.4 mg total) under the tongue every 5 (five) minutes as needed for chest pain.  Marland Kitchen nystatin-triamcinolone (MYCOLOG II) cream Apply 1 application topically 2 (two) times daily as needed (eczema).   . Omega-3 Fatty Acids (FISH OIL) 1000 MG CAPS Take 2,000 mg by mouth daily.  . pantoprazole (PROTONIX) 40 MG tablet Take 40 mg by mouth daily.  Vladimir Faster Glycol-Propyl Glycol (SYSTANE OP) Place 1 drop into both eyes 4 (four) times daily.  . polyethylene glycol (MIRALAX / GLYCOLAX) packet Take 17 g by mouth daily as needed for mild constipation.   . potassium chloride (KLOR-CON) 10 MEQ tablet Take 1 tablet (10 mEq total) by mouth daily.  . rosuvastatin (CRESTOR) 10 MG tablet Take 5 mg by mouth daily.  Marland Kitchen spironolactone (ALDACTONE) 25 MG tablet Take 1 tablet (25 mg total) by mouth at bedtime.  . Vitamin D, Ergocalciferol, (DRISDOL) 50000 units CAPS capsule Take 50,000 Units by mouth  every 14 (fourteen) days.   . vitamin E 400 UNIT capsule Take 400 Units by mouth daily.   . Wheat Dextrin (BENEFIBER) POWD Take 1 Scoop by mouth daily as needed (constipation).      Allergies:   Tussionex pennkinetic er [hydrocod polst-cpm polst er], Cephalexin, Codeine, Sulfamethoxazole, Cefuroxime axetil, Cephalosporins, Iohexol, and Moxifloxacin   Social History   Socioeconomic History  . Marital status: Widowed    Spouse name: Not on file  . Number of children: Not on file  . Years of education: Not on file  . Highest education level: Not on file  Occupational History  . Not on file  Tobacco Use  . Smoking status: Former Research scientist (life sciences)  . Smokeless tobacco: Never Used  Vaping Use  . Vaping Use: Never used  Substance and Sexual Activity  . Alcohol use: No  . Drug use: No  . Sexual activity: Not on file  Other Topics Concern  . Not on file  Social History Narrative  . Not on file   Social Determinants of Health   Financial Resource Strain:   . Difficulty of Paying Living Expenses:   Food Insecurity:   . Worried About Charity fundraiser in the Last Year:   . Arboriculturist in the Last Year:   Transportation Needs:   . Film/video editor (Medical):   Marland Kitchen Lack of Transportation (Non-Medical):   Physical Activity:   . Days of Exercise per Week:   . Minutes of Exercise per Session:   Stress:   . Feeling of Stress :   Social Connections:   . Frequency of Communication with Friends and Family:   . Frequency of Social Gatherings with Friends and Family:   . Attends Religious Services:   . Active Member of Clubs or Organizations:   . Attends Archivist Meetings:   Marland Kitchen Marital Status:      Family History: The patient's family history includes Asthma in her brother and sister; Breast cancer in her sister; COPD in her brother and sister; Diabetes in her brother, mother, and sister; Emphysema in her brother, father, and sister; Heart attack in her father and mother;  Heart disease in her brother and mother; Hypertension in her father, mother, and  sister; Lung cancer in her brother.  ROS:   Please see the history of present illness.    All other systems reviewed and are negative.  EKGs/Labs/Other Studies Reviewed:    The following studies were reviewed today: I reviewed records.  She has significant persistent mitral regurgitation.  Her cardiac MRI on 621 revealed ejection fraction of 27%.   Recent Labs: 01/14/2020: ALT 15; TSH 2.420 03/13/2020: B Natriuretic Peptide 2,200.9 04/18/2020: Hemoglobin 10.9; Platelets 476 05/08/2020: BUN 27; Creatinine, Ser 1.10; Potassium 4.4; Sodium 135  Recent Lipid Panel    Component Value Date/Time   CHOL 136 06/11/2019 1035   TRIG 175 (H) 06/11/2019 1035   HDL 43 06/11/2019 1035   CHOLHDL 3.2 06/11/2019 1035   LDLCALC 58 06/11/2019 1035    Physical Exam:    VS:  BP (!) 144/64   Pulse 84   Ht 5\' 1"  (1.549 m)   Wt 135 lb 3.2 oz (61.3 kg)   SpO2 97%   BMI 25.55 kg/m     Wt Readings from Last 3 Encounters:  05/27/20 135 lb 3.2 oz (61.3 kg)  05/08/20 138 lb (62.6 kg)  04/24/20 132 lb 8 oz (60.1 kg)     GEN: Patient is in no acute distress HEENT: Normal NECK: No JVD; No carotid bruits LYMPHATICS: No lymphadenopathy CARDIAC: Hear sounds regular, 2/6 systolic murmur at the apex. RESPIRATORY:  Clear to auscultation without rales, wheezing or rhonchi  ABDOMEN: Soft, non-tender, non-distended MUSCULOSKELETAL:  No edema; No deformity  SKIN: Warm and dry NEUROLOGIC:  Alert and oriented x 3 PSYCHIATRIC:  Normal affect   Signed, [name], MD  05/27/2020 2:47 PM    [facility]

## 2020-06-03 DIAGNOSIS — D509 Iron deficiency anemia, unspecified: Secondary | ICD-10-CM | POA: Diagnosis not present

## 2020-06-05 DIAGNOSIS — D509 Iron deficiency anemia, unspecified: Secondary | ICD-10-CM | POA: Diagnosis not present

## 2020-06-05 DIAGNOSIS — Z950 Presence of cardiac pacemaker: Secondary | ICD-10-CM | POA: Diagnosis not present

## 2020-06-05 DIAGNOSIS — E119 Type 2 diabetes mellitus without complications: Secondary | ICD-10-CM | POA: Diagnosis not present

## 2020-06-05 DIAGNOSIS — E785 Hyperlipidemia, unspecified: Secondary | ICD-10-CM | POA: Diagnosis not present

## 2020-06-05 DIAGNOSIS — Z6825 Body mass index (BMI) 25.0-25.9, adult: Secondary | ICD-10-CM | POA: Diagnosis not present

## 2020-06-05 DIAGNOSIS — E559 Vitamin D deficiency, unspecified: Secondary | ICD-10-CM | POA: Diagnosis not present

## 2020-06-05 DIAGNOSIS — Z8679 Personal history of other diseases of the circulatory system: Secondary | ICD-10-CM | POA: Diagnosis not present

## 2020-06-05 DIAGNOSIS — Z79899 Other long term (current) drug therapy: Secondary | ICD-10-CM | POA: Diagnosis not present

## 2020-06-05 DIAGNOSIS — J45909 Unspecified asthma, uncomplicated: Secondary | ICD-10-CM | POA: Diagnosis not present

## 2020-06-05 DIAGNOSIS — I10 Essential (primary) hypertension: Secondary | ICD-10-CM | POA: Diagnosis not present

## 2020-06-05 DIAGNOSIS — E039 Hypothyroidism, unspecified: Secondary | ICD-10-CM | POA: Diagnosis not present

## 2020-06-05 DIAGNOSIS — R413 Other amnesia: Secondary | ICD-10-CM | POA: Diagnosis not present

## 2020-06-10 ENCOUNTER — Other Ambulatory Visit: Payer: Self-pay | Admitting: Cardiology

## 2020-06-23 ENCOUNTER — Ambulatory Visit: Payer: Medicare Other | Admitting: Emergency Medicine

## 2020-06-23 ENCOUNTER — Telehealth: Payer: Self-pay

## 2020-06-23 ENCOUNTER — Other Ambulatory Visit: Payer: Self-pay

## 2020-06-23 DIAGNOSIS — I428 Other cardiomyopathies: Secondary | ICD-10-CM

## 2020-06-23 MED ORDER — CLINDAMYCIN HCL 300 MG PO CAPS: 300.0000 mg | ORAL_CAPSULE | Freq: Three times a day (TID) | ORAL | 0 refills | Status: AC

## 2020-06-23 NOTE — Telephone Encounter (Signed)
   Patient agreed consent to submit picture to chart. Advised patient it would be best if she could come into office today for in person assessment. Patient scheduled for visit today at 3:00 pm. Instructed to come to [place] in [place].

## 2020-06-23 NOTE — Telephone Encounter (Signed)
Patient reports of increased redness, small amount of yellow drainage at edges of site. Patient denies pain, fever or chills. Request patient email picture.  Work [employer] centre manager provided. Advised patient if she is unable to send it to please call DC back so we can schedule her in office today, direct phone number provided.

## 2020-06-23 NOTE — Progress Notes (Signed)
Patient here today for wound check. Approximately 2 cm in size, red round raised area with small black dot in center. Patient states it started Friday d/t her bra strap rubbing it. Patient denies any drainage, fever or chills.   Dr. Quentin Ore in to see patient. Dr. Quentin Ore removed drainage from site. Clindamycin sent to pharmacy. Wants patient to follow-up with in device clinic for wound recheck when Dr. Lovena Le is in office next week.  Dr. Quentin Ore wants site to stay covered with dressing. Sterile 4 x 4 placed over site with Tegaderm. Patient education on wound care.   Patient scheduled follow-up 07/03/20 at 12:30 in device clinic.

## 2020-06-23 NOTE — Telephone Encounter (Signed)
The pt states she think she has an infection where her bra strap at.  She states it is over her incision site at. The area red with some yellow. She wants to know if she can put something on it.? I told her the nurse will call her back in a few minutes.

## 2020-07-03 ENCOUNTER — Other Ambulatory Visit: Payer: Self-pay

## 2020-07-03 ENCOUNTER — Ambulatory Visit (INDEPENDENT_AMBULATORY_CARE_PROVIDER_SITE_OTHER): Payer: Medicare Other | Admitting: Emergency Medicine

## 2020-07-03 DIAGNOSIS — I5022 Chronic systolic (congestive) heart failure: Secondary | ICD-10-CM

## 2020-07-03 DIAGNOSIS — I428 Other cardiomyopathies: Secondary | ICD-10-CM

## 2020-07-03 DIAGNOSIS — Z950 Presence of cardiac pacemaker: Secondary | ICD-10-CM

## 2020-07-03 NOTE — Patient Instructions (Signed)
Continue washing site at least once daily with soap and water, then rinse and pat dry with a clean towel.  If you notice any increased swelling, drainage, redness, fever, or chills, please call the [facility] at [phone].

## 2020-07-03 NOTE — Progress Notes (Signed)
Wound recheck in clinic. Dr. Lovena Le assessed wound, scab removed from medial incision with sterile cotton-tipped applicator. Incision edges fully approximated beneath scab. No signs/symptoms of infection noted, patient denies fever/chills. Patient educated about continued wound care (washing site daily with soap and water per instructions from Dr. Lovena Le). Patient aware to call the DC for any wound concerns prior to next appointment with Dr. Lovena Le on 08/05/20.

## 2020-07-07 DIAGNOSIS — Z23 Encounter for immunization: Secondary | ICD-10-CM | POA: Diagnosis not present

## 2020-07-10 ENCOUNTER — Telehealth (HOSPITAL_COMMUNITY): Payer: Self-pay | Admitting: *Deleted

## 2020-07-10 DIAGNOSIS — D500 Iron deficiency anemia secondary to blood loss (chronic): Secondary | ICD-10-CM | POA: Diagnosis not present

## 2020-07-10 NOTE — Telephone Encounter (Signed)
Pt left VM stating she thinks Vania Rea has been giving her "vaginal pressure" and its gradually getting worse. Pt wants to know if she should change medications.  Routed to Ali Molina

## 2020-07-10 NOTE — Telephone Encounter (Signed)
Stop for 1 week to see if symptoms improve.  Would switch to Farxiga 10 mg daily after that.

## 2020-07-11 NOTE — Telephone Encounter (Signed)
2nd attempt to reach pt. Pt picked up phone and hung up. I called patient back and no answer.

## 2020-07-14 DIAGNOSIS — R5383 Other fatigue: Secondary | ICD-10-CM | POA: Diagnosis not present

## 2020-07-14 DIAGNOSIS — J310 Chronic rhinitis: Secondary | ICD-10-CM | POA: Diagnosis not present

## 2020-07-14 DIAGNOSIS — G4733 Obstructive sleep apnea (adult) (pediatric): Secondary | ICD-10-CM | POA: Diagnosis not present

## 2020-07-14 DIAGNOSIS — J4520 Mild intermittent asthma, uncomplicated: Secondary | ICD-10-CM | POA: Diagnosis not present

## 2020-07-22 ENCOUNTER — Encounter (HOSPITAL_COMMUNITY): Payer: Self-pay | Admitting: Cardiology

## 2020-07-22 ENCOUNTER — Other Ambulatory Visit: Payer: Self-pay

## 2020-07-22 ENCOUNTER — Ambulatory Visit (HOSPITAL_COMMUNITY)
Admission: RE | Admit: 2020-07-22 | Discharge: 2020-07-22 | Disposition: A | Discharge status: Home / Self Care | Payer: Medicare Other | Source: Ambulatory Visit | Attending: Cardiology | Admitting: Cardiology

## 2020-07-22 ENCOUNTER — Ambulatory Visit (HOSPITAL_BASED_OUTPATIENT_CLINIC_OR_DEPARTMENT_OTHER)
Admission: RE | Admit: 2020-07-22 | Discharge: 2020-07-22 | Disposition: A | Discharge status: Home / Self Care | Payer: Medicare Other | Source: Ambulatory Visit | Attending: Cardiology | Admitting: Cardiology

## 2020-07-22 VITALS — BP 98/60 | HR 68 | Ht 61.0 in | Wt 140.4 lb

## 2020-07-22 DIAGNOSIS — Z7982 Long term (current) use of aspirin: Secondary | ICD-10-CM | POA: Insufficient documentation

## 2020-07-22 DIAGNOSIS — E119 Type 2 diabetes mellitus without complications: Secondary | ICD-10-CM | POA: Insufficient documentation

## 2020-07-22 DIAGNOSIS — I5022 Chronic systolic (congestive) heart failure: Secondary | ICD-10-CM | POA: Diagnosis not present

## 2020-07-22 DIAGNOSIS — I081 Rheumatic disorders of both mitral and tricuspid valves: Secondary | ICD-10-CM | POA: Diagnosis not present

## 2020-07-22 DIAGNOSIS — Z8249 Family history of ischemic heart disease and other diseases of the circulatory system: Secondary | ICD-10-CM | POA: Insufficient documentation

## 2020-07-22 DIAGNOSIS — I428 Other cardiomyopathies: Secondary | ICD-10-CM | POA: Diagnosis not present

## 2020-07-22 DIAGNOSIS — E785 Hyperlipidemia, unspecified: Secondary | ICD-10-CM | POA: Insufficient documentation

## 2020-07-22 DIAGNOSIS — Z833 Family history of diabetes mellitus: Secondary | ICD-10-CM | POA: Insufficient documentation

## 2020-07-22 DIAGNOSIS — G4730 Sleep apnea, unspecified: Secondary | ICD-10-CM | POA: Insufficient documentation

## 2020-07-22 DIAGNOSIS — Z87891 Personal history of nicotine dependence: Secondary | ICD-10-CM | POA: Diagnosis not present

## 2020-07-22 DIAGNOSIS — I2510 Atherosclerotic heart disease of native coronary artery without angina pectoris: Secondary | ICD-10-CM | POA: Insufficient documentation

## 2020-07-22 DIAGNOSIS — Z79899 Other long term (current) drug therapy: Secondary | ICD-10-CM | POA: Diagnosis not present

## 2020-07-22 DIAGNOSIS — I110 Hypertensive heart disease with heart failure: Secondary | ICD-10-CM | POA: Insufficient documentation

## 2020-07-22 DIAGNOSIS — K219 Gastro-esophageal reflux disease without esophagitis: Secondary | ICD-10-CM | POA: Insufficient documentation

## 2020-07-22 DIAGNOSIS — Z7984 Long term (current) use of oral hypoglycemic drugs: Secondary | ICD-10-CM | POA: Diagnosis not present

## 2020-07-22 DIAGNOSIS — E039 Hypothyroidism, unspecified: Secondary | ICD-10-CM | POA: Diagnosis not present

## 2020-07-22 DIAGNOSIS — I447 Left bundle-branch block, unspecified: Secondary | ICD-10-CM | POA: Insufficient documentation

## 2020-07-22 LAB — BASIC METABOLIC PANEL WITH GFR
Anion gap: 13 (ref 5–15)
BUN: 27 mg/dL — ABNORMAL HIGH (ref 8–23)
CO2: 27 mmol/L (ref 22–32)
Calcium: 9.8 mg/dL (ref 8.9–10.3)
Chloride: 99 mmol/L (ref 98–111)
Creatinine, Ser: 1.24 mg/dL — ABNORMAL HIGH (ref 0.44–1.00)
GFR calc Af Amer: 47 mL/min — ABNORMAL LOW
GFR calc non Af Amer: 40 mL/min — ABNORMAL LOW
Glucose, Bld: 105 mg/dL — ABNORMAL HIGH (ref 70–99)
Potassium: 4 mmol/L (ref 3.5–5.1)
Sodium: 139 mmol/L (ref 135–145)

## 2020-07-22 LAB — CBC
HCT: 45.2 % (ref 36.0–46.0)
Hemoglobin: 14.5 g/dL (ref 12.0–15.0)
MCH: 28.2 pg (ref 26.0–34.0)
MCHC: 32.1 g/dL (ref 30.0–36.0)
MCV: 87.8 fL (ref 80.0–100.0)
Platelets: 331 10*3/uL (ref 150–400)
RBC: 5.15 MIL/uL — ABNORMAL HIGH (ref 3.87–5.11)
RDW: 17.9 % — ABNORMAL HIGH (ref 11.5–15.5)
WBC: 7.7 10*3/uL (ref 4.0–10.5)
nRBC: 0 % (ref 0.0–0.2)

## 2020-07-22 LAB — ECHOCARDIOGRAM COMPLETE
Area-P 1/2: 5.25 cm2
Calc EF: 39 %
MV M vel: 5.75 m/s
MV Peak grad: 132 mmHg
Radius: 0.2 cm
S' Lateral: 4.15 cm
Single Plane A2C EF: 39.5 %
Single Plane A4C EF: 42.4 %

## 2020-07-22 LAB — DIGOXIN LEVEL: Digoxin Level: 1.5 ng/mL (ref 1.0–2.0)

## 2020-07-22 MED ORDER — EMPAGLIFLOZIN 10 MG PO TABS: 10.0000 mg | ORAL_TABLET | Freq: Every day | ORAL | 3 refills | Status: DC

## 2020-07-22 MED ORDER — METFORMIN HCL 500 MG PO TABS: 500.0000 mg | ORAL_TABLET | Freq: Two times a day (BID) | ORAL | 5 refills | Status: DC

## 2020-07-22 MED ORDER — FUROSEMIDE 40 MG PO TABS: 40.0000 mg | ORAL_TABLET | Freq: Two times a day (BID) | ORAL | 3 refills | Status: DC

## 2020-07-22 MED ORDER — FUROSEMIDE 20 MG PO TABS: 20.0000 mg | ORAL_TABLET | Freq: Two times a day (BID) | ORAL | 3 refills | Status: DC

## 2020-07-22 NOTE — Patient Instructions (Addendum)
RESTART Jardiance 10mg  (1 tab) daily  DECREASE Metformin to 500mg  (1 tab) twice a day when you start Jardiance  [facility]   Labs today We will only contact you if something comes back abnormal or we need to make some changes. Otherwise no news is good news!  Your physician recommends that you schedule a follow-up appointment in: 3-4 weeks with the Pharmacist Lauren and 3 month with Dr [name].   Please call office at [phone] option [phone] if you have any questions or concerns.   At the [facility], you and your health needs are our priority. As part of our continuing mission to provide you with exceptional heart care, we have created designated Provider Care Teams. These Care Teams include your primary Cardiologist (physician) and Advanced Practice Providers (APPs- Physician Assistants and Nurse Practitioners) who all work together to provide you with the care you need, when you need it.   You may see any of the following providers on your designated Care Team at your next follow up: Marland Kitchen Dr Glori Bickers . Dr Loralie Champagne . Darrick Grinder, NP . Lyda Jester, PA . Audry Riles, PharmD   Please be sure to bring in all your medications bottles to every appointment.

## 2020-07-22 NOTE — Progress Notes (Signed)
  Echocardiogram 2D Echocardiogram has been performed.  Bobbye Charleston 07/22/2020, 10:53 AM

## 2020-07-23 ENCOUNTER — Telehealth (HOSPITAL_COMMUNITY): Payer: Self-pay | Admitting: *Deleted

## 2020-07-23 DIAGNOSIS — I5022 Chronic systolic (congestive) heart failure: Secondary | ICD-10-CM

## 2020-07-23 MED ORDER — DIGOXIN 62.5 MCG PO TABS
0.0625 mg | ORAL_TABLET | ORAL | 4 refills | Status: DC
Start: 2020-07-23 — End: 2020-10-21

## 2020-07-23 NOTE — Telephone Encounter (Signed)
Pt aware, agreeable, and verbalized understanding, she will decrease dig to QOD, order for dig level in 10 days faxed to [facility] in [place] at [phone]

## 2020-07-23 NOTE — Telephone Encounter (Signed)
-----   Message from Larey Dresser, MD sent at 07/22/2020  2:02 PM EDT ----- Decrease digoxin to 0.0625 mg every other day and repeat digoxin level as trough in 10 days.

## 2020-07-23 NOTE — Progress Notes (Signed)
PCP: [name], MD  Cardiology: [name], MD HF Cardiology: Dr. [name]  84 y.o. with history of chronic systolic CHF (nonischemic cardiomyopathy), chronic LBBB, and mitral regurgitation was referred by Dr. Burt Knack for CHF evaluation prior to Mitraclip placement.   CHF was first noted in 12/20, she was admitted to the hospital in [facility] at that time.  LHC showed nonobstructive CAD.  She later followed up with Dr. Geraldo Pitter and had echo in 3/21, showing EF 35-40% with concern for severe MR.  TEE was then done in 4/21 and I reviewed it today, showing EF 30-35% with septal-lateral dyssynchrony, moderate-severe functional MR, normal RV, severe TR.  She has had a LBBB on ECGs in 2021, she did not have LBBB in 2019.    Cardiac MRI in 6/21 showed moderate LV dilation, EF 27%, moderately decreased RV function with EF 31%, no LGE, probably moderate MR.   She had St Jude CRT-P device implanted in 7/21.  Echo was done today and reviewed, EF up to 40% with normal RV, mild-moderate MR.   She returns for followup of CHF.  Weight is stable. No chest pain.  No orthopnea/PND.  Walks for 15 minutes in the evening without dyspnea.  Generally, no trouble walking on flat ground.  She gets dizzy only if she bends over and stands back up too quickly. She is off Jardiance but has not had symptoms consistent with yeast infection or UTI.     Labs (4/21): K 4.1, creatinine 1.16 Labs (5/21): K 3.8, creatinine 1.21 => 1.1, BNP 2424 => 2201, digoxin 0.4 Labs (6/21): K 4.6, creatinine 1.17, hgb 10.9 Labs (7/21): K 4.4, creatinine 1.11, digoxin 0.8  ECG (personally reviewed): NSR, BiV paced.   PMH: 1. LBBB: Chronic.  2. Anemia: Prior GI workup in [place] was negative.  No history of overt GI bleeding.  3. Type 2 diabetes 4. Hyperlipidemia 5. CAD: LHC (2020) with 40% ostial left main, 50% mid RCA.  6. Chronic systolic CHF: Noted since 12/20.  Nonischemic cardiomyopathy.  - TEE (4/21): EF 30-35%,  septal-lateral dyssynchrony, moderate-severe MR appears functional, normal RV size and systolic function, severe biatrial enlargement, severe TR.  - Cardiac MRI (6/21): Moderate LV dilation, EF 27%; moderately decreased RV function with EF 31%, no LGE, probably moderate MR.  - St Jude CRT-P implanted 7/21.  - Echo (9/21): EF 40%, diffuse hypokinesis, mildly decreased RV systolic function, mild-moderate MR, normal IVC.  7. GERD 8. Hyperlipidemia 9. Hypothyroidism 10. Mitral regurgitation: Suspected to be functional.  Echo in 9/21 with improved MR, appears mild-moderate.   Social History   Socioeconomic History  . Marital status: Widowed    Spouse name: Not on file  . Number of children: Not on file  . Years of education: Not on file  . Highest education level: Not on file  Occupational History  . Not on file  Tobacco Use  . Smoking status: Former Research scientist (life sciences)  . Smokeless tobacco: Never Used  Vaping Use  . Vaping Use: Never used  Substance and Sexual Activity  . Alcohol use: No  . Drug use: No  . Sexual activity: Not on file  Other Topics Concern  . Not on file  Social History Narrative  . Not on file   Social Determinants of Health   Financial Resource Strain:   . Difficulty of Paying Living Expenses: Not on file  Food Insecurity:   . Worried About Charity fundraiser in the Last Year: Not on file  .  Ran Out of Food in the Last Year: Not on file  Transportation Needs:   . Lack of Transportation (Medical): Not on file  . Lack of Transportation (Non-Medical): Not on file  Physical Activity:   . Days of Exercise per Week: Not on file  . Minutes of Exercise per Session: Not on file  Stress:   . Feeling of Stress : Not on file  Social Connections:   . Frequency of Communication with Friends and Family: Not on file  . Frequency of Social Gatherings with Friends and Family: Not on file  . Attends Religious Services: Not on file  . Active Member of Clubs or Organizations: Not on  file  . Attends Archivist Meetings: Not on file  . Marital Status: Not on file  Intimate Partner Violence:   . Fear of Current or Ex-Partner: Not on file  . Emotionally Abused: Not on file  . Physically Abused: Not on file  . Sexually Abused: Not on file   Family History  Problem Relation Age of Onset  . Diabetes Mother   . Heart disease Mother   . Hypertension Mother   . Heart attack Mother   . Emphysema Father   . Hypertension Father   . Heart attack Father   . Breast cancer Sister   . Diabetes Sister   . Emphysema Sister   . COPD Sister   . Asthma Sister   . Hypertension Sister   . Lung cancer Brother   . Diabetes Brother   . Emphysema Brother   . COPD Brother   . Asthma Brother   . Heart disease Brother    ROS: All systems reviewed and negative except as per HPI.   Current Outpatient Medications  Medication Sig Dispense Refill  . albuterol (PROVENTIL) (2.5 MG/3ML) 0.083% nebulizer solution Take 2.5 mg by nebulization every 6 (six) hours as needed for wheezing or shortness of breath.     Marland Kitchen aspirin EC 81 MG tablet Take 1 tablet (81 mg total) by mouth daily. 30 tablet 11  . b complex vitamins tablet Take 1 tablet by mouth daily.    . Calcium Carb-Cholecalciferol (CALCIUM 600 + D PO) Take 1 tablet by mouth daily.    . cetirizine (ZYRTEC) 10 MG tablet Take 10 mg by mouth daily.    . cyclobenzaprine (FLEXERIL) 10 MG tablet Take 10 mg by mouth at bedtime as needed for muscle spasms.     . Digoxin 62.5 MCG TABS Take 0.0625 mg by mouth daily. 90 tablet 4  . fluticasone (FLONASE) 50 MCG/ACT nasal spray Place 2 sprays into both nostrils daily.     . furosemide (LASIX) 20 MG tablet Take 1 tablet (20 mg total) by mouth 2 (two) times daily. Take with 40 mg to equal 60 mg twice daily 180 tablet 3  . furosemide (LASIX) 40 MG tablet Take 1 tablet (40 mg total) by mouth 2 (two) times daily. Take with 20 mg to equal 60 mg twice daily 180 tablet 3  . levalbuterol (XOPENEX HFA)  45 MCG/ACT inhaler Inhale 2 puffs into the lungs every 4 (four) hours as needed for wheezing.    Marland Kitchen levothyroxine (SYNTHROID) 88 MCG tablet Take 88 mcg by mouth daily.    . meclizine (ANTIVERT) 25 MG tablet Take 25 mg by mouth every 8 (eight) hours as needed for dizziness or nausea.     . metFORMIN (GLUCOPHAGE) 500 MG tablet Take 1 tablet (500 mg total) by mouth 2 (two) times  daily. 60 tablet 5  . metoprolol succinate (TOPROL-XL) 100 MG 24 hr tablet Take 100 mg by mouth daily. Take with or immediately following a meal.     . montelukast (SINGULAIR) 10 MG tablet Take 10 mg by mouth at bedtime.     . naproxen sodium (ALEVE) 220 MG tablet Take 440 mg by mouth daily as needed (pain).     . Omega-3 Fatty Acids (FISH OIL) 1000 MG CAPS Take 2,000 mg by mouth daily.    . pantoprazole (PROTONIX) 40 MG tablet Take 40 mg by mouth daily.    Vladimir Faster Glycol-Propyl Glycol (SYSTANE OP) Place 1 drop into both eyes 4 (four) times daily.    . polyethylene glycol (MIRALAX / GLYCOLAX) packet Take 17 g by mouth daily as needed for mild constipation.     . potassium chloride (KLOR-CON) 10 MEQ tablet Take 1 tablet (10 mEq total) by mouth daily. 30 tablet 4  . rosuvastatin (CRESTOR) 10 MG tablet TAKE ONE-HALF (1/2) TABLET DAILY 45 tablet 3  . spironolactone (ALDACTONE) 25 MG tablet Take 1 tablet (25 mg total) by mouth at bedtime. 90 tablet 3  . vitamin E 400 UNIT capsule Take 400 Units by mouth daily.     . empagliflozin (JARDIANCE) 10 MG TABS tablet Take 1 tablet (10 mg total) by mouth daily before breakfast. 90 tablet 3  . ferrous sulfate 325 (65 FE) MG EC tablet Take 325 mg by mouth 2 (two) times daily as needed (low iron).  (Patient not taking: Reported on 07/22/2020)    . Fluticasone-Salmeterol (ADVAIR) 250-50 MCG/DOSE AEPB Inhale 1 puff into the lungs 2 (two) times daily as needed (shortness of breath).  (Patient not taking: Reported on 07/22/2020)    . nitroGLYCERIN (NITROSTAT) 0.4 MG SL tablet Place 1 tablet (0.4  mg total) under the tongue every 5 (five) minutes as needed for chest pain. (Patient not taking: Reported on 07/22/2020) 30 tablet 2  . nystatin-triamcinolone (MYCOLOG II) cream Apply 1 application topically 2 (two) times daily as needed (eczema).     . Vitamin D, Ergocalciferol, (DRISDOL) 50000 units CAPS capsule Take 50,000 Units by mouth every 14 (fourteen) days.  (Patient not taking: Reported on 07/22/2020)    . Wheat Dextrin (BENEFIBER) POWD Take 1 Scoop by mouth daily as needed (constipation).  (Patient not taking: Reported on 07/22/2020)     No current facility-administered medications for this encounter.   BP 98/60 (BP Location: Left Arm, Patient Position: Sitting, Cuff Size: Normal)   Pulse 68   Ht 5\' 1"  (1.549 m)   Wt 63.7 kg (140 lb 6.4 oz)   SpO2 98%   BMI 26.53 kg/m  General: NAD Neck: No JVD, no thyromegaly or thyroid nodule.  Lungs: Clear to auscultation bilaterally with normal respiratory effort. CV: Nondisplaced PMI.  Heart regular S1/S2, no S3/S4, no murmur.  No peripheral edema.  No carotid bruit.  Normal pedal pulses.  Abdomen: Soft, nontender, no hepatosplenomegaly, no distention.  Skin: Intact without lesions or rashes.  Neurologic: Alert and oriented x 3.  Psych: Normal affect. Extremities: No clubbing or cyanosis.  HEENT: Normal.   Assessment/Plan: 1. Chronic systolic CHF: Patient has a nonischemic cardiomyopathy of uncertain etiology.  She has significant mitral regurgitation.  This is functional, and I do not think that it explains her cardiomyopathy (though mitral regurgitation likely worsens symptoms).  She has a LBBB that is new since the prior ECG in 2019, cannot rule out LBBB cardiomyopathy.  Prior myocarditis is also  a consideration.  TEE in 4/21 showed EF 30-35% with prominent septal-lateral dyssynchrony.  Cardiac MRI in 6/21 showed moderate LV dilation, EF 27%, moderately decreased RV function with EF 31%, no LGE, probably moderate MR.  St Jude CRT-P device  implanted in 7/21.  Echo in 9/21 with EF up to 40%, improved MR (only mild-moderate). NYHA class II symptoms, not volume overloaded on exam.  BP remains soft, making medication titration difficult.  - Continue Lasix 60 mg bid.    - Continue spironolactone 25 mg daily.  - No BP room for Entresto. - Continue digoxin 0.0625 mg daily, check level.  - Continue Toprol XL 100 mg daily.  - I will have her restart Jardiance 10 mg daily.  BMET today and in 10 days.   - Has been referred to cardiac rehab at [facility].  2. CAD: Nonobstructive on 2020 cath.  No chest pain.  - Continue ASA 81 and Crestor.   3. Mitral regurgitation: She has functional mitral regurgitation, moderate-severe/3+ at least on TEE in 4/21.  However, after CRT, mitral regurgitation was mild-moderate on 9/21 echo.   See clinical pharmacist in 1 month for medication titration, see me in 3 months.    Loralie Champagne 07/23/2020

## 2020-07-24 ENCOUNTER — Ambulatory Visit (INDEPENDENT_AMBULATORY_CARE_PROVIDER_SITE_OTHER): Payer: Medicare Other

## 2020-07-24 DIAGNOSIS — I428 Other cardiomyopathies: Secondary | ICD-10-CM | POA: Diagnosis not present

## 2020-07-25 DIAGNOSIS — D500 Iron deficiency anemia secondary to blood loss (chronic): Secondary | ICD-10-CM | POA: Diagnosis not present

## 2020-07-26 LAB — CUP PACEART REMOTE DEVICE CHECK
Battery Remaining Longevity: 53 mo
Battery Remaining Percentage: 95.5 %
Battery Voltage: 2.98 V
Brady Statistic AP VP Percent: 1 %
Brady Statistic AP VS Percent: 1 %
Brady Statistic AS VP Percent: 98 %
Brady Statistic AS VS Percent: 1 %
Brady Statistic RA Percent Paced: 1 %
Date Time Interrogation Session: 20210930020009
Implantable Lead Implant Date: 20210701
Implantable Lead Implant Date: 20210701
Implantable Lead Implant Date: 20210701
Implantable Lead Location: 753858
Implantable Lead Location: 753859
Implantable Lead Location: 753860
Implantable Pulse Generator Implant Date: 20210701
Lead Channel Impedance Value: 450 Ohm
Lead Channel Impedance Value: 580 Ohm
Lead Channel Impedance Value: 850 Ohm
Lead Channel Pacing Threshold Amplitude: 0.5 V
Lead Channel Pacing Threshold Amplitude: 0.5 V
Lead Channel Pacing Threshold Amplitude: 1 V
Lead Channel Pacing Threshold Pulse Width: 0.5 ms
Lead Channel Pacing Threshold Pulse Width: 0.5 ms
Lead Channel Pacing Threshold Pulse Width: 0.5 ms
Lead Channel Sensing Intrinsic Amplitude: 12 mV
Lead Channel Sensing Intrinsic Amplitude: 3.9 mV
Lead Channel Setting Pacing Amplitude: 3.5 V
Lead Channel Setting Pacing Amplitude: 3.5 V
Lead Channel Setting Pacing Amplitude: 3.5 V
Lead Channel Setting Pacing Pulse Width: 0.5 ms
Lead Channel Setting Pacing Pulse Width: 0.5 ms
Lead Channel Setting Sensing Sensitivity: 2 mV
Pulse Gen Model: 3562
Pulse Gen Serial Number: 3818336

## 2020-07-28 NOTE — Progress Notes (Signed)
Remote pacemaker transmission.   

## 2020-07-29 ENCOUNTER — Other Ambulatory Visit: Payer: Self-pay | Admitting: Cardiology

## 2020-07-29 DIAGNOSIS — I5022 Chronic systolic (congestive) heart failure: Secondary | ICD-10-CM | POA: Diagnosis not present

## 2020-07-30 LAB — DIGOXIN LEVEL: Digoxin, Serum: <0.4 ng/mL — ABNORMAL LOW (ref 0.5–0.9)

## 2020-07-31 DIAGNOSIS — R195 Other fecal abnormalities: Secondary | ICD-10-CM | POA: Diagnosis not present

## 2020-07-31 DIAGNOSIS — D500 Iron deficiency anemia secondary to blood loss (chronic): Secondary | ICD-10-CM | POA: Diagnosis not present

## 2020-07-31 DIAGNOSIS — K7581 Nonalcoholic steatohepatitis (NASH): Secondary | ICD-10-CM | POA: Diagnosis not present

## 2020-08-01 ENCOUNTER — Other Ambulatory Visit (HOSPITAL_COMMUNITY): Payer: Self-pay | Admitting: Cardiology

## 2020-08-05 ENCOUNTER — Ambulatory Visit (INDEPENDENT_AMBULATORY_CARE_PROVIDER_SITE_OTHER): Payer: Medicare Other | Admitting: Internal Medicine

## 2020-08-05 ENCOUNTER — Other Ambulatory Visit: Payer: Self-pay

## 2020-08-05 VITALS — BP 118/80 | HR 84 | Ht 61.0 in | Wt 142.4 lb

## 2020-08-05 DIAGNOSIS — Z950 Presence of cardiac pacemaker: Secondary | ICD-10-CM | POA: Insufficient documentation

## 2020-08-05 DIAGNOSIS — I2510 Atherosclerotic heart disease of native coronary artery without angina pectoris: Secondary | ICD-10-CM | POA: Diagnosis not present

## 2020-08-05 DIAGNOSIS — I428 Other cardiomyopathies: Secondary | ICD-10-CM

## 2020-08-05 DIAGNOSIS — I447 Left bundle-branch block, unspecified: Secondary | ICD-10-CM

## 2020-08-05 HISTORY — DX: Presence of cardiac pacemaker: Z95.0

## 2020-08-05 NOTE — Patient Instructions (Signed)

## 2020-08-05 NOTE — Progress Notes (Signed)
HPI Vanessa Johnson returns today for followup. She is a pleasant 84 yo woman with a non-ischemic CM, chronic systolic heart failure and LBBB who underwent biv PPM insertion 3 months ago. She has done well. Repeat echo showed that her EF increased from 25-30% to 40%. She has class 2A symptoms and no syncope or peripheral edema. No limitation to activity.  Allergies  Allergen Reactions  . Tussionex Pennkinetic Er [Hydrocod Polst-Cpm Polst Er] Swelling and Rash    Throat swelling  . Cephalexin Swelling    Throat swelling   . Codeine Other (See Comments)    Unknown   . Sulfamethoxazole Other (See Comments)    Unknown  . Cefuroxime Axetil Rash  . Cephalosporins Rash  . Iohexol Rash  . Moxifloxacin Rash     Current Outpatient Medications  Medication Sig Dispense Refill  . albuterol (PROVENTIL) (2.5 MG/3ML) 0.083% nebulizer solution Take 2.5 mg by nebulization every 6 (six) hours as needed for wheezing or shortness of breath.     Marland Kitchen aspirin EC 81 MG tablet Take 1 tablet (81 mg total) by mouth daily. 30 tablet 11  . b complex vitamins tablet Take 1 tablet by mouth daily.    . Calcium Carb-Cholecalciferol (CALCIUM 600 + D PO) Take 1 tablet by mouth daily.    . cetirizine (ZYRTEC) 10 MG tablet Take 10 mg by mouth daily.    . cyclobenzaprine (FLEXERIL) 10 MG tablet Take 10 mg by mouth at bedtime as needed for muscle spasms.     . Digoxin 62.5 MCG TABS Take 0.0625 mg by mouth every other day. 90 tablet 4  . empagliflozin (JARDIANCE) 10 MG TABS tablet Take 1 tablet (10 mg total) by mouth daily before breakfast. 90 tablet 3  . ferrous sulfate 325 (65 FE) MG EC tablet Take 325 mg by mouth 2 (two) times daily as needed (low iron).     . fluticasone (FLONASE) 50 MCG/ACT nasal spray Place 2 sprays into both nostrils daily.     . Fluticasone-Salmeterol (ADVAIR) 250-50 MCG/DOSE AEPB Inhale 1 puff into the lungs 2 (two) times daily as needed (shortness of breath).     . furosemide (LASIX) 20 MG  tablet Take 1 tablet (20 mg total) by mouth 2 (two) times daily. Take with 40 mg to equal 60 mg twice daily 180 tablet 3  . furosemide (LASIX) 40 MG tablet Take 1 tablet (40 mg total) by mouth 2 (two) times daily. Take with 20 mg to equal 60 mg twice daily 180 tablet 3  . levalbuterol (XOPENEX HFA) 45 MCG/ACT inhaler Inhale 2 puffs into the lungs every 4 (four) hours as needed for wheezing.    Marland Kitchen levothyroxine (SYNTHROID) 88 MCG tablet Take 88 mcg by mouth daily.    . meclizine (ANTIVERT) 25 MG tablet Take 25 mg by mouth every 8 (eight) hours as needed for dizziness or nausea.     . metFORMIN (GLUCOPHAGE) 500 MG tablet Take 1 tablet (500 mg total) by mouth 2 (two) times daily. 60 tablet 5  . metoprolol succinate (TOPROL-XL) 100 MG 24 hr tablet Take 100 mg by mouth daily. Take with or immediately following a meal.     . montelukast (SINGULAIR) 10 MG tablet Take 10 mg by mouth at bedtime.     . naproxen sodium (ALEVE) 220 MG tablet Take 440 mg by mouth daily as needed (pain).     . nitroGLYCERIN (NITROSTAT) 0.4 MG SL tablet Place 1 tablet (0.4 mg total)  under the tongue every 5 (five) minutes as needed for chest pain. 30 tablet 2  . nystatin-triamcinolone (MYCOLOG II) cream Apply 1 application topically 2 (two) times daily as needed (eczema).     . Omega-3 Fatty Acids (FISH OIL) 1000 MG CAPS Take 2,000 mg by mouth daily.    . pantoprazole (PROTONIX) 40 MG tablet Take 40 mg by mouth daily.    Vladimir Faster Glycol-Propyl Glycol (SYSTANE OP) Place 1 drop into both eyes 4 (four) times daily.    . polyethylene glycol (MIRALAX / GLYCOLAX) packet Take 17 g by mouth daily as needed for mild constipation.     . potassium chloride (KLOR-CON) 10 MEQ tablet Take 1 tablet (10 mEq total) by mouth daily. 30 tablet 4  . rosuvastatin (CRESTOR) 10 MG tablet TAKE ONE-HALF (1/2) TABLET DAILY 45 tablet 3  . spironolactone (ALDACTONE) 25 MG tablet Take 1 tablet (25 mg total) by mouth at bedtime. 90 tablet 3  . Vitamin D,  Ergocalciferol, (DRISDOL) 50000 units CAPS capsule Take 50,000 Units by mouth every 14 (fourteen) days.     . vitamin E 400 UNIT capsule Take 400 Units by mouth daily.     . Wheat Dextrin (BENEFIBER) POWD Take 1 Scoop by mouth daily as needed (constipation).      No current facility-administered medications for this visit.     Past Medical History:  Diagnosis Date  . Allergic rhinitis   . Back pain    left lower back  . Benign essential HTN   . BMI 34.0-34.9,adult   . Bronchial asthma   . Bronchitis 11/20/2018  . CAD (coronary artery disease)    Heart cath 01/2017 [facility]  . Cholelithiasis with cholecystitis without obstruction 12/04/2015  . Diabetes mellitus ([place])   . Diabetes mellitus due to underlying condition with complication, without long-term current use of insulin ([place]) 01/15/2016   Formatting of this note might be different from the original. Added automatically from request for surgery [id number]  . Diabetes mellitus without complication ([place]) 5/57/3220  . Dysfunction of right eustachian tube 11/20/2018  . Essential hypertension 05/17/2017  . Gastritis    Dr. Orlena Sheldon- EGD 2018  . GERD (gastroesophageal reflux disease)   . Hyperlipidemia   . Hypothyroid   . Ischiogluteal bursitis of left side   . Mixed dyslipidemia 05/17/2017  . Obstructive sleep apnea of adult 12/04/2015  . Pre-ulcerative corn or callous   . Sleep apnea    On CPAP  . Tinea pedis of right foot 01/15/2016  . Unsteadiness 08/16/2018   Last Assessment & Plan:  Emergency department follow-up for evaluation of vertigo. Acute onset of vertigo 08/12/2018.  CT head at that time was okay.  Vertiginous symptoms resolved that day.  She persists with some unsteadiness but it is generally improving.  She has chronic history of unsteady that generally is helped by meclizine.  Denies any hearing loss symptoms or ringing in the ears. EXAM sh  . Vitamin D deficiency     ROS:   All systems reviewed and negative except as  noted in the HPI.   Past Surgical History:  Procedure Laterality Date  . BIV PACEMAKER INSERTION CRT-P N/A 04/24/2020   Procedure: BIV PACEMAKER INSERTION CRT-P;  Surgeon: Evans Lance, MD;  Location: [place] [facility];  Service: Cardiovascular;  Laterality: N/A;  . [place], and 2000  . CATARACT EXTRACTION, BILATERAL  2016  . CHOLECYSTECTOMY  2017  . DILATION AND CURETTAGE OF UTERUS  [place]  . LEFT HEART CATH AND CORONARY ANGIOGRAPHY  01/2017   [facility]- No intervention  . TEE WITHOUT CARDIOVERSION N/A 02/21/2020   Procedure: TRANSESOPHAGEAL ECHOCARDIOGRAM (TEE);  Surgeon: Dorothy Spark, MD;  Location: [facility] ENDOSCOPY;  Service: Cardiovascular;  Laterality: N/A;  . TONSILLECTOMY  1965  . TOTAL HIP ARTHROPLASTY Bilateral    left- 2005 and right- 2010     Family History  Problem Relation Age of Onset  . Diabetes Mother   . Heart disease Mother   . Hypertension Mother   . Heart attack Mother   . Emphysema Father   . Hypertension Father   . Heart attack Father   . Breast cancer Sister   . Diabetes Sister   . Emphysema Sister   . COPD Sister   . Asthma Sister   . Hypertension Sister   . Lung cancer Brother   . Diabetes Brother   . Emphysema Brother   . COPD Brother   . Asthma Brother   . Heart disease Brother      Social History   Socioeconomic History  . Marital status: Widowed    Spouse name: Not on file  . Number of children: Not on file  . Years of education: Not on file  . Highest education level: Not on file  Occupational History  . Not on file  Tobacco Use  . Smoking status: Former Research scientist (life sciences)  . Smokeless tobacco: Never Used  Vaping Use  . Vaping Use: Never used  Substance and Sexual Activity  . Alcohol use: No  . Drug use: No  . Sexual activity: Not on file  Other Topics Concern  . Not on file  Social History Narrative  . Not on file   Social Determinants of  Health   Financial Resource Strain:   . Difficulty of Paying Living Expenses: Not on file  Food Insecurity:   . Worried About Charity fundraiser in the Last Year: Not on file  . Ran Out of Food in the Last Year: Not on file  Transportation Needs:   . Lack of Transportation (Medical): Not on file  . Lack of Transportation (Non-Medical): Not on file  Physical Activity:   . Days of Exercise per Week: Not on file  . Minutes of Exercise per Session: Not on file  Stress:   . Feeling of Stress : Not on file  Social Connections:   . Frequency of Communication with Friends and Family: Not on file  . Frequency of Social Gatherings with Friends and Family: Not on file  . Attends Religious Services: Not on file  . Active Member of Clubs or Organizations: Not on file  . Attends Archivist Meetings: Not on file  . Marital Status: Not on file  Intimate Partner Violence:   . Fear of Current or Ex-Partner: Not on file  . Emotionally Abused: Not on file  . Physically Abused: Not on file  . Sexually Abused: Not on file     BP 118/80   Pulse 84   Ht 5\' 1"  (1.549 m)   Wt 142 lb 6.4 oz (64.6 kg)   SpO2 92%   BMI 26.91 kg/m   Physical Exam:  Well appearing NAD HEENT: Unremarkable Neck:  No JVD, no thyromegally Lymphatics:  No adenopathy Back:  No CVA tenderness Lungs:  Clear with no wheezes; well healed PPM incision HEART:  Regular rate rhythm, no murmurs, no rubs,  no clicks Abd:  soft, positive bowel sounds, no organomegally, no rebound, no guarding Ext:  2 plus pulses, no edema, no cyanosis, no clubbing Skin:  No rashes no nodules Neuro:  CN II through XII intact, motor grossly intact   DEVICE  Normal device function.  See PaceArt for details.   Assess/Plan: 1. Chronic systolic heart failure - her EF is improved and her symptoms are class 2.  2. PPM - her St. Jude biv pPM is working normally. We will recheck in several months. 3. Dyslipidemia - she will continue her  statin therapy. 4. MR - she has had an improvement in her MR since her biv placement. I encouraged her to maintain a low sodium diet.  Carleene Overlie Sebastain Fishbaugh,MD

## 2020-08-14 ENCOUNTER — Other Ambulatory Visit: Payer: Self-pay

## 2020-08-14 ENCOUNTER — Ambulatory Visit (HOSPITAL_COMMUNITY)
Admission: RE | Admit: 2020-08-14 | Discharge: 2020-08-14 | Disposition: A | Discharge status: Home / Self Care | Payer: Medicare Other | Source: Ambulatory Visit | Attending: Cardiology | Admitting: Cardiology

## 2020-08-14 VITALS — BP 98/62 | HR 85 | Wt 142.0 lb

## 2020-08-14 DIAGNOSIS — I5022 Chronic systolic (congestive) heart failure: Secondary | ICD-10-CM | POA: Diagnosis not present

## 2020-08-14 DIAGNOSIS — Z79899 Other long term (current) drug therapy: Secondary | ICD-10-CM | POA: Diagnosis not present

## 2020-08-14 DIAGNOSIS — I340 Nonrheumatic mitral (valve) insufficiency: Secondary | ICD-10-CM | POA: Diagnosis not present

## 2020-08-14 DIAGNOSIS — I447 Left bundle-branch block, unspecified: Secondary | ICD-10-CM | POA: Diagnosis not present

## 2020-08-14 DIAGNOSIS — I428 Other cardiomyopathies: Secondary | ICD-10-CM | POA: Diagnosis not present

## 2020-08-14 MED ORDER — METOPROLOL SUCCINATE ER 100 MG PO TB24: ORAL_TABLET | ORAL | 3 refills | Status: DC

## 2020-08-14 NOTE — Patient Instructions (Addendum)
It was a pleasure seeing you today!  MEDICATIONS: -We are changing your medications today -Increase metoprolol succinate to 100 mg (1 tablet) every morning and 50 mg (1/2 tablet) every evening.  -Call if you have questions about your medications.   NEXT APPOINTMENT: Return to clinic in 2 months with Dr. [name].  In general, to take care of your heart failure: -Limit your fluid intake to 2 Liters (half-gallon) per day.   -Limit your salt intake to ideally 2-3 grams (2000-3000 mg) per day. -Weigh yourself daily and record, and bring that "weight diary" to your next appointment.  (Weight gain of 2-3 pounds in 1 day typically means fluid weight.) -The medications for your heart are to help your heart and help you live longer.   -Please contact us before stopping any of your heart medications.  Call the clinic at [phone] with questions or to reschedule future appointments.

## 2020-08-14 NOTE — Progress Notes (Signed)
PCP: [name], MD  Cardiology: [name], MD HF Cardiology: Dr. [name]  HPI:  84 y.o. with history of chronic systolic CHF (nonischemic cardiomyopathy), chronic LBBB, and mitral regurgitation was referred by Dr. Burt Knack for CHF evaluation prior to Mitraclip placement.   CHF was first noted in 09/2019, she was admitted to the hospital in [facility] at that time.  LHC showed nonobstructive CAD.  She later followed up with Dr. Geraldo Pitter and had echo in 12/2019, showing EF 35-40% with concern for severe MR. TEE was then done in 01/2020  showing EF 30-35% with septal-lateral dyssynchrony, moderate-severe functional MR, normal RV, severe TR. She has had a LBBB on ECGs in 2021, she did not have LBBB in 2019.    Cardiac MRI in 03/2020 showed moderate LV dilation, EF 27%, moderately decreased RV function with EF 31%, no LGE, probably moderate MR.   She had St Jude CRT-P device implanted in 04/2020.  Echo was done today and reviewed, EF up to 40% with normal RV, mild-moderate MR.   She returned for follow up of CHF with Dr. [name] on 07/22/20. Weight was stable. No chest pain.  No orthopnea/PND.  Reported walking for 15 minutes in the evening without dyspnea. Generally, no trouble walking on flat ground. She reported dizziness only if she bent over and stood back up too quickly. She was off Jardiance but had not had symptoms consistent with yeast infection or UTI.   Today she returns to HF clinic for pharmacist medication titration. At last visit with Dr. [name], empagliflozin was restarted at 10 mg daily and  digoxin was decreased to 0.0625 mg QOD due to elevated level of 1.5 ng/mL. Digoxin level improved to <0.4 ng/mL after dose was decreased. Overall feeling good today. Has dizziness and lightheadedness quite frequently but attributed this to vertigo. No fatigue. No chest pain or palpitations. Breathing has been pretty good. No SOB/DOE. Weight at home has been stable at 137-140 pounds. Takes  furosemide 60 mg BID, has not needed any extra. No PND/Orthopnea. Appetite is good, adheres to low salt diet. Taking all medications as prescribed. She had not yet taken medications today. Tolerating all medications. No affordability issues.  HF Medications:  Metoprolol succinate 100 mg daily Spironolactone 25 mg nightly Empagliflozin (Jardiance) 10 mg daily Digoxin 0.0625 mg every other day  Furosemide 60 mg BID  Has the patient been experiencing any side effects to the medications prescribed?  NO  Does the patient have any problems obtaining medications due to transportation or finances? NO, has [employer]  Understanding of regimen: Good Understanding of indications: Good Potential of compliance: Good Patient understands to avoid NSAIDs. Patient understands to avoid decongestants.   Pertinent Lab Values 07/22/20: . Serum creatinine 1.24, BUN 27, Potassium 4.0, Sodium 139, Digoxin 1.5, <0.4 on 07/29/20   Vital Signs: . Weight: 142 lbs (last clinic weight: 140 lbs) . Blood pressure: 98/62  . Heart rate: 85   Assessment: 1. Chronic systolic CHF: Patient has a nonischemic cardiomyopathy of uncertain etiology.  She has significant mitral regurgitation.  This is functional, likely does not explain her cardiomyopathy (though mitral regurgitation likely worsens symptoms).  She has a LBBB that is new since the prior ECG in 2019, cannot rule out LBBB cardiomyopathy.  Prior myocarditis is also a consideration.  TEE in 01/2020 showed EF 30-35% with prominent septal-lateral dyssynchrony. Cardiac MRI in 03/2020 showed moderate LV dilation, EF 27%, moderately decreased RV function with EF 31%, no LGE, probably moderate MR.  St Jude CRT-P device implanted in 04/2020.  Echo 07/22/20 with EF up to 40%, improved MR (only mild-moderate).  - NYHA class II symptoms, euvolemic on exam - Continue furosemide 60 mg BID.  - Increase metoprolol to 100 mg QAM and 50 mg QPM - Continue spironolactone 25 mg nightly.  -  Continue digoxin 0.0625 mg every other day - Continue empagliflozin 10 mg daily.   - No BP room for Entresto. - Has been referred to cardiac rehab at [facility].  2. CAD: Nonobstructive on 2020 cath.  No chest pain.  - Continue ASA 81 and rosuvastatin.   3. Mitral regurgitation: She has functional mitral regurgitation, moderate-severe/3+ at least on TEE in 4/21.  However, after CRT, mitral regurgitation was mild-moderate on 07/22/20 echo.    Plan: 1) Medication changes: Based on clinical presentation, vital signs and recent labs will increase metoprolol succinate to 100 mg QAM and 50 mg QPM 2) Follow-up with Dr. [name] in 8 weeks    Audry Riles, PharmD, BCPS, BCCP, CPP Heart Failure Clinic Pharmacist [phone]

## 2020-08-19 DIAGNOSIS — Z23 Encounter for immunization: Secondary | ICD-10-CM | POA: Diagnosis not present

## 2020-08-27 ENCOUNTER — Other Ambulatory Visit: Payer: Self-pay | Admitting: Oncology

## 2020-08-27 DIAGNOSIS — D509 Iron deficiency anemia, unspecified: Secondary | ICD-10-CM

## 2020-08-27 NOTE — Progress Notes (Addendum)
[place]  [address] [place],  [place]  [address] [phone]  Clinic Day:  08/28/2020  Referring physician: [name], MD   HISTORY OF PRESENT ILLNESS:  The patient is a 84 y.o. female with  Iron deficiency anemia.  As she has problems tolerating oral iron, she recently received IV Feraheme to replenish her iron stores and normalize her hemoglobin.  She comes in today to reassess her iron and hemoglobin.  Since receiving her iron, the patient has felt much better.  She denies having any overt forms of blood loss to explain her recent iron deficiency anemia.  PHYSICAL EXAM:  Blood pressure 117/69, height 5\' 1"  (1.549 m), weight 141 lb 6.4 oz (64.1 kg), SpO2 98 %. Wt Readings from Last 3 Encounters:  08/28/20 141 lb 6.4 oz (64.1 kg)  08/14/20 142 lb (64.4 kg)  08/05/20 142 lb 6.4 oz (64.6 kg)   Body mass index is 26.72 kg/m. Performance status (ECOG): 0 - Asymptomatic Physical Exam Constitutional:      Appearance: Normal appearance.  HENT:     Mouth/Throat:     Pharynx: Oropharynx is clear. No oropharyngeal exudate.  Cardiovascular:     Rate and Rhythm: Normal rate and regular rhythm.     Heart sounds: No murmur heard.  No friction rub. No gallop.   Pulmonary:     Breath sounds: Normal breath sounds.  Abdominal:     General: Bowel sounds are normal. There is no distension.     Palpations: Abdomen is soft. There is no mass.     Tenderness: There is no abdominal tenderness.  Musculoskeletal:        General: No tenderness.     Cervical back: Normal range of motion and neck supple.     Right lower leg: No edema.     Left lower leg: No edema.  Lymphadenopathy:     Cervical: No cervical adenopathy.     Right cervical: No superficial, deep or posterior cervical adenopathy.    Left cervical: No superficial, deep or posterior cervical adenopathy.     Upper Body:     Right upper body: No supraclavicular or axillary adenopathy.      Left upper body: No supraclavicular or axillary adenopathy.     Lower Body: No right inguinal adenopathy. No left inguinal adenopathy.  Skin:    Coloration: Skin is not jaundiced.     Findings: No lesion or rash.  Neurological:     General: No focal deficit present.     Mental Status: She is alert and oriented to person, place, and time. Mental status is at baseline.  Psychiatric:        Mood and Affect: Mood normal.        Behavior: Behavior normal.        Thought Content: Thought content normal.        Judgment: Judgment normal.     LABS:       Ref. Range 08/28/2020 13:47  Iron Latest Ref Range: 28 - 170 ug/dL 61  UIBC Latest Units: ug/dL 497  TIBC Latest Ref Range: 250 - 450 ug/dL 558 (H)  Saturation Ratios Latest Ref Range: 10.4 - 31.8 % 11  Ferritin Latest Ref Range: 11 - 307 ng/mL 20   ASSESSMENT & PLAN:   Assessment/Plan:  A 84 y.o. female with iron deficiency anemia.  Her hemoglobin levels has significantly improved since receiving her IV Feraheme.  However her iron parameters remain low.  As  she cannot tolerate oral iron, I will arrange for her to receive another course of IV iron over these next few weeks.  I will see her back in 4 months for repeat clinical assessment. The patient understands all the plans discussed today and is in agreement with them.      Trinidi Toppins Macarthur Critchley, MD

## 2020-08-28 ENCOUNTER — Other Ambulatory Visit: Payer: Self-pay | Admitting: Oncology

## 2020-08-28 ENCOUNTER — Ambulatory Visit: Payer: Medicare Other | Admitting: Cardiology

## 2020-08-28 ENCOUNTER — Other Ambulatory Visit: Payer: Self-pay

## 2020-08-28 ENCOUNTER — Inpatient Hospital Stay: Payer: Medicare Other | Attending: Oncology

## 2020-08-28 ENCOUNTER — Encounter: Payer: Self-pay | Admitting: Oncology

## 2020-08-28 ENCOUNTER — Telehealth: Payer: Self-pay | Admitting: Oncology

## 2020-08-28 ENCOUNTER — Inpatient Hospital Stay (INDEPENDENT_AMBULATORY_CARE_PROVIDER_SITE_OTHER): Payer: Medicare Other | Admitting: Oncology

## 2020-08-28 VITALS — BP 117/69 | Ht 61.0 in | Wt 141.4 lb

## 2020-08-28 DIAGNOSIS — D509 Iron deficiency anemia, unspecified: Secondary | ICD-10-CM | POA: Diagnosis not present

## 2020-08-28 DIAGNOSIS — I2510 Atherosclerotic heart disease of native coronary artery without angina pectoris: Secondary | ICD-10-CM

## 2020-08-28 DIAGNOSIS — D649 Anemia, unspecified: Secondary | ICD-10-CM | POA: Diagnosis not present

## 2020-08-28 DIAGNOSIS — Z0001 Encounter for general adult medical examination with abnormal findings: Secondary | ICD-10-CM | POA: Diagnosis not present

## 2020-08-28 LAB — IRON AND TIBC
Iron: 61 ug/dL (ref 28–170)
Saturation Ratios: 11 % (ref 10.4–31.8)
TIBC: 558 ug/dL — ABNORMAL HIGH (ref 250–450)
UIBC: 497 ug/dL

## 2020-08-28 LAB — FERRITIN: Ferritin: 20 ng/mL (ref 11–307)

## 2020-08-28 NOTE — Telephone Encounter (Signed)
Per 11/4 LOS.Patient given next appt

## 2020-08-29 ENCOUNTER — Telehealth: Payer: Self-pay

## 2020-09-02 DIAGNOSIS — D509 Iron deficiency anemia, unspecified: Secondary | ICD-10-CM | POA: Insufficient documentation

## 2020-09-02 DIAGNOSIS — S90819A Abrasion, unspecified foot, initial encounter: Secondary | ICD-10-CM | POA: Diagnosis not present

## 2020-09-02 DIAGNOSIS — S99922A Unspecified injury of left foot, initial encounter: Secondary | ICD-10-CM | POA: Diagnosis not present

## 2020-09-02 HISTORY — DX: Iron deficiency anemia, unspecified: D50.9

## 2020-09-03 ENCOUNTER — Telehealth: Payer: Self-pay | Admitting: Oncology

## 2020-09-03 DIAGNOSIS — I10 Essential (primary) hypertension: Secondary | ICD-10-CM | POA: Diagnosis not present

## 2020-09-03 DIAGNOSIS — S92355A Nondisplaced fracture of fifth metatarsal bone, left foot, initial encounter for closed fracture: Secondary | ICD-10-CM | POA: Diagnosis not present

## 2020-09-03 DIAGNOSIS — E119 Type 2 diabetes mellitus without complications: Secondary | ICD-10-CM | POA: Diagnosis not present

## 2020-09-03 DIAGNOSIS — E785 Hyperlipidemia, unspecified: Secondary | ICD-10-CM | POA: Diagnosis not present

## 2020-09-03 DIAGNOSIS — Z950 Presence of cardiac pacemaker: Secondary | ICD-10-CM | POA: Diagnosis not present

## 2020-09-03 DIAGNOSIS — E559 Vitamin D deficiency, unspecified: Secondary | ICD-10-CM | POA: Diagnosis not present

## 2020-09-03 DIAGNOSIS — J45909 Unspecified asthma, uncomplicated: Secondary | ICD-10-CM | POA: Diagnosis not present

## 2020-09-03 DIAGNOSIS — R413 Other amnesia: Secondary | ICD-10-CM | POA: Diagnosis not present

## 2020-09-03 DIAGNOSIS — Z8679 Personal history of other diseases of the circulatory system: Secondary | ICD-10-CM | POA: Diagnosis not present

## 2020-09-03 DIAGNOSIS — E039 Hypothyroidism, unspecified: Secondary | ICD-10-CM | POA: Diagnosis not present

## 2020-09-03 DIAGNOSIS — D509 Iron deficiency anemia, unspecified: Secondary | ICD-10-CM | POA: Diagnosis not present

## 2020-09-03 DIAGNOSIS — Z6824 Body mass index (BMI) 24.0-24.9, adult: Secondary | ICD-10-CM | POA: Diagnosis not present

## 2020-09-03 NOTE — Telephone Encounter (Signed)
Per 11/9 sched msg appt sched.appt.Lft vm msg.

## 2020-09-04 MED FILL — Ferumoxytol Inj 510 MG/17ML (30 MG/ML) (Elemental Fe): INTRAVENOUS | Qty: 17 | Status: AC

## 2020-09-04 NOTE — Telephone Encounter (Signed)
Opened in error

## 2020-09-05 ENCOUNTER — Inpatient Hospital Stay: Payer: Medicare Other

## 2020-09-05 ENCOUNTER — Telehealth: Payer: Self-pay

## 2020-09-09 ENCOUNTER — Other Ambulatory Visit: Payer: Self-pay | Admitting: Pharmacist

## 2020-09-09 NOTE — Telephone Encounter (Signed)
At the request of Vanessa Johnson, spoke with the patient today to advise her that her ferritin remains low, so Dr. Bobby Rumpf did recommend that she receive IV Feraheme again.  Patient is agreeable to do this, but has other plans for this week, as her hemoglobin is normal she would like to delay until after Thanksgiving.  A message was sent to scheduling to schedule her IV Feraheme.

## 2020-09-10 ENCOUNTER — Telehealth: Payer: Self-pay | Admitting: Oncology

## 2020-09-10 NOTE — Telephone Encounter (Signed)
Per 11/16 Staff Message, patient requested to cancel 11/18 Iron Inf.  Requested to wait until after Thanksgiving.  11/18 CX

## 2020-09-10 NOTE — Telephone Encounter (Signed)
Left Message for patient to return call  

## 2020-09-10 NOTE — Telephone Encounter (Signed)
Paptient returned called - Gave her 11/29, 12/6 Feraheme Appt's

## 2020-09-11 ENCOUNTER — Ambulatory Visit: Payer: Medicare Other

## 2020-09-16 DIAGNOSIS — J45909 Unspecified asthma, uncomplicated: Secondary | ICD-10-CM | POA: Insufficient documentation

## 2020-09-16 DIAGNOSIS — E559 Vitamin D deficiency, unspecified: Secondary | ICD-10-CM | POA: Insufficient documentation

## 2020-09-16 DIAGNOSIS — E785 Hyperlipidemia, unspecified: Secondary | ICD-10-CM | POA: Insufficient documentation

## 2020-09-16 DIAGNOSIS — K219 Gastro-esophageal reflux disease without esophagitis: Secondary | ICD-10-CM | POA: Insufficient documentation

## 2020-09-16 DIAGNOSIS — M7072 Other bursitis of hip, left hip: Secondary | ICD-10-CM | POA: Insufficient documentation

## 2020-09-16 DIAGNOSIS — Z6834 Body mass index (BMI) 34.0-34.9, adult: Secondary | ICD-10-CM | POA: Insufficient documentation

## 2020-09-16 DIAGNOSIS — M549 Dorsalgia, unspecified: Secondary | ICD-10-CM | POA: Insufficient documentation

## 2020-09-16 DIAGNOSIS — E039 Hypothyroidism, unspecified: Secondary | ICD-10-CM | POA: Insufficient documentation

## 2020-09-16 DIAGNOSIS — J309 Allergic rhinitis, unspecified: Secondary | ICD-10-CM | POA: Insufficient documentation

## 2020-09-16 DIAGNOSIS — E119 Type 2 diabetes mellitus without complications: Secondary | ICD-10-CM | POA: Insufficient documentation

## 2020-09-16 DIAGNOSIS — G4730 Sleep apnea, unspecified: Secondary | ICD-10-CM | POA: Insufficient documentation

## 2020-09-16 DIAGNOSIS — K2970 Gastritis, unspecified, without bleeding: Secondary | ICD-10-CM | POA: Insufficient documentation

## 2020-09-16 DIAGNOSIS — L84 Corns and callosities: Secondary | ICD-10-CM | POA: Insufficient documentation

## 2020-09-16 DIAGNOSIS — I10 Essential (primary) hypertension: Secondary | ICD-10-CM | POA: Insufficient documentation

## 2020-09-17 ENCOUNTER — Ambulatory Visit (INDEPENDENT_AMBULATORY_CARE_PROVIDER_SITE_OTHER): Payer: Medicare Other | Admitting: Cardiology

## 2020-09-17 ENCOUNTER — Encounter: Payer: Self-pay | Admitting: Cardiology

## 2020-09-17 ENCOUNTER — Other Ambulatory Visit: Payer: Self-pay

## 2020-09-17 VITALS — BP 122/58 | HR 76 | Ht 61.0 in | Wt 146.0 lb

## 2020-09-17 DIAGNOSIS — I10 Essential (primary) hypertension: Secondary | ICD-10-CM | POA: Diagnosis not present

## 2020-09-17 DIAGNOSIS — E559 Vitamin D deficiency, unspecified: Secondary | ICD-10-CM

## 2020-09-17 DIAGNOSIS — I2510 Atherosclerotic heart disease of native coronary artery without angina pectoris: Secondary | ICD-10-CM

## 2020-09-17 DIAGNOSIS — E782 Mixed hyperlipidemia: Secondary | ICD-10-CM | POA: Diagnosis not present

## 2020-09-17 DIAGNOSIS — E039 Hypothyroidism, unspecified: Secondary | ICD-10-CM

## 2020-09-17 DIAGNOSIS — I447 Left bundle-branch block, unspecified: Secondary | ICD-10-CM

## 2020-09-17 DIAGNOSIS — E088 Diabetes mellitus due to underlying condition with unspecified complications: Secondary | ICD-10-CM | POA: Diagnosis not present

## 2020-09-17 DIAGNOSIS — I428 Other cardiomyopathies: Secondary | ICD-10-CM

## 2020-09-17 DIAGNOSIS — Z950 Presence of cardiac pacemaker: Secondary | ICD-10-CM

## 2020-09-17 NOTE — Patient Instructions (Signed)
Medication Instructions:  No medication changes. *If you need a refill on your cardiac medications before your next appointment, please call your pharmacy*   Lab Work: Your physician recommends that you have labs done in the office today. Your test included  basic metabolic panel, complete blood count, TSH, liver function and vitamin D.  If you have labs (blood work) drawn today and your tests are completely normal, you will receive your results only by: Marland Kitchen MyChart Message (if you have MyChart) OR . A paper copy in the mail If you have any lab test that is abnormal or we need to change your treatment, we will call you to review the results.   Testing/Procedures: None ordered   Follow-Up: At [facility], you and your health needs are our priority.  As part of our continuing mission to provide you with exceptional heart care, we have created designated Provider Care Teams.  These Care Teams include your primary Cardiologist (physician) and Advanced Practice Providers (APPs -  Physician Assistants and Nurse Practitioners) who all work together to provide you with the care you need, when you need it.  We recommend signing up for the patient portal called "MyChart".  Sign up information is provided on this After Visit Summary.  MyChart is used to connect with patients for Virtual Visits (Telemedicine).  Patients are able to view lab/test results, encounter notes, upcoming appointments, etc.  Non-urgent messages can be sent to your provider as well.   To learn more about what you can do with MyChart, go to [email or web address].    Your next appointment:   3 month(s)  The format for your next appointment:   In Person  Provider:   Jyl Heinz, MD   Other Instructions NA

## 2020-09-17 NOTE — Progress Notes (Signed)
Cardiology Office Note:    Date:  09/17/2020   ID:  Vanessa Johnson, DOB 1936/08/01, MRN [id number]  PCP:  Vanessa Johns, MD  Cardiologist:  Vanessa Lindau, MD   Referring MD: Vanessa Johns, MD    ASSESSMENT:    1. Coronary artery disease involving native coronary artery of native heart without angina pectoris   2. Essential hypertension   3. Nonischemic cardiomyopathy ([place])   4. Left bundle branch block   5. Mixed hyperlipidemia   6. Benign essential HTN   7. Biventricular cardiac pacemaker in situ   8. Diabetes mellitus due to underlying condition with complication, without long-term current use of insulin (HCC)    PLAN:    In order of problems listed above:  1. Coronary artery disease: Secondary prevention stressed with the patient.  Importance of compliance with diet medication stressed vocalized understanding. 2. Nonischemic cardiomyopathy: Post biventricular pacemaker.  Stable and good ejection fraction improvement.  Patient is very happy and feeling clinically better.  I reviewed the pacemaker evaluation notes and discussed with the patient is in question were answered to her satisfaction. 3. Essential hypertension: Blood pressure stable 4. Mixed dyslipidemia: Diet was emphasized.  I told her she is done an excellent job weight loss and not to lose that advantage now. 5. Diabetes mellitus: Stable and managed by primary care physician. 6. Patient will have blood work today as she is on multiple medications. 7. Patient will be seen in follow-up appointment in 6 months or earlier if the patient has any concerns.  Sleep health issues were discussed.   Medication Adjustments/Labs and Tests Ordered: Current medicines are reviewed at length with the patient today.  Concerns regarding medicines are outlined above.  No orders of the defined types were placed in this encounter.  No orders of the defined types were placed in this encounter.    Chief Complaint  Patient presents  with  . Follow-up     History of Present Illness:    Vanessa Johnson is a 84 y.o. female.  Patient has past medical history of asthma disease and nonischemic cardiomyopathy.  She has essential hypertension dyslipidemia and diabetes mellitus.  She has a biventricular cardiac pacemaker.  This has resulted in improved ejection fraction.  She has lost significant amount of weight.  She is doing very well she is getting iron infusions for anemia.  She denies any problems at this time and takes care of activities of daily living.  No chest pain orthopnea or PND.  At the time of my evaluation, the patient is alert awake oriented and in no distress.  She had a mechanical fall and fractured her left foot and is getting better.  Past Medical History:  Diagnosis Date  . Allergic rhinitis   . Back pain    left lower back  . Benign essential HTN   . Biventricular cardiac pacemaker in situ 08/05/2020  . BMI 34.0-34.9,adult   . Bronchial asthma   . Bronchitis 11/20/2018  . CAD (coronary artery disease)    Heart cath 01/2017 [facility]  . Cholelithiasis with cholecystitis without obstruction 12/04/2015  . Diabetes mellitus ([place])   . Diabetes mellitus due to underlying condition with complication, without long-term current use of insulin ([place]) 01/15/2016   Formatting of this note might be different from the original. Added automatically from request for surgery [id number]  . Diabetes mellitus without complication ([place]) 1/47/8295  . Dysfunction of right eustachian tube 11/20/2018  . Essential hypertension 05/17/2017  . Gastritis  Dr. Orlena Johnson- EGD 2018  . GERD (gastroesophageal reflux disease)   . Hyperlipidemia   . Hypothyroid   . Iron deficiency anemia 09/02/2020  . Ischiogluteal bursitis of left side   . Left bundle branch block 04/02/2020  . Mixed dyslipidemia 05/17/2017  . Nonischemic cardiomyopathy ([place]) 04/02/2020  . Obstructive sleep apnea of adult 12/04/2015  . Pre-ulcerative corn or callous   . Sleep  apnea    On CPAP  . Tinea pedis of right foot 01/15/2016  . Unsteadiness 08/16/2018   Last Assessment & Plan:  Emergency department follow-up for evaluation of vertigo. Acute onset of vertigo 08/12/2018.  CT head at that time was okay.  Vertiginous symptoms resolved that day.  She persists with some unsteadiness but it is generally improving.  She has chronic history of unsteady that generally is helped by meclizine.  Denies any hearing loss symptoms or ringing in the ears. EXAM sh  . Vitamin D deficiency     Past Surgical History:  Procedure Laterality Date  . BIV PACEMAKER INSERTION CRT-P N/A 04/24/2020   Procedure: BIV PACEMAKER INSERTION CRT-P;  Surgeon: Vanessa Lance, MD;  Location: [facility];  Service: Cardiovascular;  Laterality: N/A;  . [place], and 2000  . CATARACT EXTRACTION, BILATERAL  2016  . CHOLECYSTECTOMY  2017  . DILATION AND CURETTAGE OF UTERUS  1996  . [place]  . LEFT HEART CATH AND CORONARY ANGIOGRAPHY  01/2017   [facility]- No intervention  . TEE WITHOUT CARDIOVERSION N/A 02/21/2020   Procedure: TRANSESOPHAGEAL ECHOCARDIOGRAM (TEE);  Surgeon: Vanessa Spark, MD;  Location: [facility] ENDOSCOPY;  Service: Cardiovascular;  Laterality: N/A;  . TONSILLECTOMY  1965  . TOTAL HIP ARTHROPLASTY Bilateral    left- 2005 and right- 2010    Current Medications: Current Meds  Medication Sig  . albuterol (PROVENTIL) (2.5 MG/3ML) 0.083% nebulizer solution Take 2.5 mg by nebulization every 6 (six) hours as needed for wheezing or shortness of breath.   Marland Kitchen aspirin EC 81 MG tablet Take 1 tablet (81 mg total) by mouth daily.  Marland Kitchen b complex vitamins tablet Take 1 tablet by mouth daily.  . Calcium Carb-Cholecalciferol (CALCIUM 600 + D PO) Take 1 tablet by mouth daily.  . cetirizine (ZYRTEC) 10 MG tablet Take 10 mg by mouth daily.  . cyclobenzaprine (FLEXERIL) 10 MG tablet Take 10 mg by mouth at bedtime as needed  for muscle spasms.   . Digoxin 62.5 MCG TABS Take 0.0625 mg by mouth every other day.  . empagliflozin (JARDIANCE) 10 MG TABS tablet Take 1 tablet (10 mg total) by mouth daily before breakfast.  . fluticasone (FLONASE) 50 MCG/ACT nasal spray Place 2 sprays into both nostrils daily.   . Fluticasone-Salmeterol (ADVAIR) 250-50 MCG/DOSE AEPB Inhale 1 puff into the lungs 2 (two) times daily as needed (shortness of breath).   . furosemide (LASIX) 20 MG tablet Take 1 tablet (20 mg total) by mouth 2 (two) times daily. Take with 40 mg to equal 60 mg twice daily  . furosemide (LASIX) 40 MG tablet Take 1 tablet (40 mg total) by mouth 2 (two) times daily. Take with 20 mg to equal 60 mg twice daily  . levalbuterol (XOPENEX HFA) 45 MCG/ACT inhaler Inhale 2 puffs into the lungs every 4 (four) hours as needed for wheezing.  Marland Kitchen levothyroxine (SYNTHROID) 88 MCG tablet Take 88 mcg by mouth daily.  . meclizine (ANTIVERT) 25 MG tablet Take 25 mg  by mouth every 8 (eight) hours as needed for dizziness or nausea.   . metoprolol succinate (TOPROL-XL) 100 MG 24 hr tablet Take 100 mg (1 tablet) every morning and 50 mg (1/2 tablet) every evening.  . montelukast (SINGULAIR) 10 MG tablet Take 10 mg by mouth at bedtime.   . mupirocin ointment (BACTROBAN) 2 % Apply 1 application topically 2 (two) times daily.  . naproxen sodium (ALEVE) 220 MG tablet Take 440 mg by mouth daily as needed (pain).   . nitroGLYCERIN (NITROSTAT) 0.4 MG SL tablet Place 1 tablet (0.4 mg total) under the tongue every 5 (five) minutes as needed for chest pain.  . Omega-3 Fatty Acids (FISH OIL) 1000 MG CAPS Take 2,000 mg by mouth daily.  . pantoprazole (PROTONIX) 40 MG tablet Take 40 mg by mouth daily.  Vladimir Faster Glycol-Propyl Glycol (SYSTANE OP) Place 1 drop into both eyes 4 (four) times daily.  . polyethylene glycol (MIRALAX / GLYCOLAX) packet Take 17 g by mouth daily as needed for mild constipation.   . potassium chloride (KLOR-CON) 10 MEQ tablet Take  1 tablet (10 mEq total) by mouth daily.  . rosuvastatin (CRESTOR) 10 MG tablet TAKE ONE-HALF (1/2) TABLET DAILY  . spironolactone (ALDACTONE) 25 MG tablet Take 1 tablet (25 mg total) by mouth at bedtime.  . vitamin E 400 UNIT capsule Take 400 Units by mouth daily.   . Wheat Dextrin (BENEFIBER) POWD Take 1 Scoop by mouth daily as needed (constipation).      Allergies:   Tussionex pennkinetic er [hydrocod polst-cpm polst er], Cephalexin, Codeine, Sulfamethoxazole, Cefuroxime axetil, Cephalosporins, Iohexol, and Moxifloxacin   Social History   Socioeconomic History  . Marital status: Widowed    Spouse name: Not on file  . Number of children: Not on file  . Years of education: Not on file  . Highest education level: Not on file  Occupational History  . Not on file  Tobacco Use  . Smoking status: Former Research scientist (life sciences)  . Smokeless tobacco: Never Used  Vaping Use  . Vaping Use: Never used  Substance and Sexual Activity  . Alcohol use: No  . Drug use: No  . Sexual activity: Not on file  Other Topics Concern  . Not on file  Social History Narrative  . Not on file   Social Determinants of Health   Financial Resource Strain:   . Difficulty of Paying Living Expenses: Not on file  Food Insecurity:   . Worried About Charity fundraiser in the Last Year: Not on file  . Ran Out of Food in the Last Year: Not on file  Transportation Needs:   . Lack of Transportation (Medical): Not on file  . Lack of Transportation (Non-Medical): Not on file  Physical Activity:   . Days of Exercise per Week: Not on file  . Minutes of Exercise per Session: Not on file  Stress:   . Feeling of Stress : Not on file  Social Connections:   . Frequency of Communication with Friends and Family: Not on file  . Frequency of Social Gatherings with Friends and Family: Not on file  . Attends Religious Services: Not on file  . Active Member of Clubs or Organizations: Not on file  . Attends Archivist Meetings:  Not on file  . Marital Status: Not on file     Family History: The patient's family history includes Asthma in her brother and sister; Breast cancer in her sister; COPD in her brother and sister;  Diabetes in her brother, mother, and sister; Emphysema in her brother, father, and sister; Heart attack in her father and mother; Heart disease in her brother and mother; Hypertension in her father, mother, and sister; Lung cancer in her brother.  ROS:   Please see the history of present illness.    All other systems reviewed and are negative.  EKGs/Labs/Other Studies Reviewed:    The following studies were reviewed today: IMPRESSION: 1. Severely reduced left ventricular systolic function with moderately dilated left ventricular chamber size. Dyssynchronous septum. LVEF 27%.  2. Moderate-severely reduced right ventricular systolic function with normal right ventricular chamber size. RVEF 31%.  3. No post contrast delayed myocardial enhancement. No evidence of scar, fibrosis, inflammation, or infarction.  4.  Small pericardial effusion.  Small bilateral pleural effusions.  5. Quantitation of mitral regurgitation is approximately moderate, however this value may be underestimated due to image acquisition technique.   Electronically Signed   By: Cherlynn Kaiser   On: 04/14/2020 13:40   Recent Labs: 01/14/2020: ALT 15; TSH 2.420 03/13/2020: B Natriuretic Peptide 2,200.9 07/22/2020: BUN 27; Creatinine, Ser 1.24; Hemoglobin 14.5; Platelets 331; Potassium 4.0; Sodium 139  Recent Lipid Panel    Component Value Date/Time   CHOL 136 06/11/2019 1035   TRIG 175 (H) 06/11/2019 1035   HDL 43 06/11/2019 1035   CHOLHDL 3.2 06/11/2019 1035   LDLCALC 58 06/11/2019 1035    Physical Exam:    VS:  BP (!) 122/58 (BP Location: Right Arm, Patient Position: Sitting)   Pulse 76   Ht 5\' 1"  (1.549 m)   Wt 146 lb (66.2 kg)   SpO2 98%   BMI 27.59 kg/m     Wt Readings from Last 3  Encounters:  09/17/20 146 lb (66.2 kg)  08/28/20 141 lb 6.4 oz (64.1 kg)  08/14/20 142 lb (64.4 kg)     GEN: Patient is in no acute distress HEENT: Normal NECK: No JVD; No carotid bruits LYMPHATICS: No lymphadenopathy CARDIAC: Hear sounds regular, 2/6 systolic murmur at the apex. RESPIRATORY:  Clear to auscultation without rales, wheezing or rhonchi  ABDOMEN: Soft, non-tender, non-distended MUSCULOSKELETAL:  No edema; No deformity  SKIN: Warm and dry NEUROLOGIC:  Alert and oriented x 3 PSYCHIATRIC:  Normal affect   Signed, Vanessa Lindau, MD  09/17/2020 10:44 AM    [place]

## 2020-09-18 LAB — CBC WITH DIFFERENTIAL/PLATELET
Basophils Absolute: 0.1 10*3/uL (ref 0.0–0.2)
Basos: 1 %
EOS (ABSOLUTE): 0.4 10*3/uL (ref 0.0–0.4)
Eos: 6 %
Hematocrit: 39.8 % (ref 34.0–46.6)
Hemoglobin: 13.4 g/dL (ref 11.1–15.9)
Immature Grans (Abs): 0 10*3/uL (ref 0.0–0.1)
Immature Granulocytes: 0 %
Lymphocytes Absolute: 1.9 10*3/uL (ref 0.7–3.1)
Lymphs: 28 %
MCH: 28.8 pg (ref 26.6–33.0)
MCHC: 33.7 g/dL (ref 31.5–35.7)
MCV: 86 fL (ref 79–97)
Monocytes Absolute: 0.7 10*3/uL (ref 0.1–0.9)
Monocytes: 10 %
Neutrophils Absolute: 3.6 10*3/uL (ref 1.4–7.0)
Neutrophils: 55 %
Platelets: 341 10*3/uL (ref 150–450)
RBC: 4.65 x10E6/uL (ref 3.77–5.28)
RDW: 13.3 % (ref 11.7–15.4)
WBC: 6.6 10*3/uL (ref 3.4–10.8)

## 2020-09-18 LAB — TSH: TSH: 1.02 u[IU]/mL (ref 0.450–4.500)

## 2020-09-18 LAB — BASIC METABOLIC PANEL
BUN/Creatinine Ratio: 27 (ref 12–28)
BUN: 33 mg/dL — ABNORMAL HIGH (ref 8–27)
CO2: 26 mmol/L (ref 20–29)
Calcium: 9.9 mg/dL (ref 8.7–10.3)
Chloride: 98 mmol/L (ref 96–106)
Creatinine, Ser: 1.22 mg/dL — ABNORMAL HIGH (ref 0.57–1.00)
GFR calc Af Amer: 48 mL/min/{1.73_m2} — ABNORMAL LOW (ref >59)
GFR calc non Af Amer: 41 mL/min/{1.73_m2} — ABNORMAL LOW (ref >59)
Glucose: 92 mg/dL (ref 65–99)
Potassium: 4.8 mmol/L (ref 3.5–5.2)
Sodium: 138 mmol/L (ref 134–144)

## 2020-09-18 LAB — HEPATIC FUNCTION PANEL
ALT: 18 IU/L (ref 0–32)
AST: 17 IU/L (ref 0–40)
Albumin: 4.6 g/dL (ref 3.6–4.6)
Alkaline Phosphatase: 109 IU/L (ref 44–121)
Bilirubin Total: 0.4 mg/dL (ref 0.0–1.2)
Bilirubin, Direct: 0.12 mg/dL (ref 0.00–0.40)
Total Protein: 7.3 g/dL (ref 6.0–8.5)

## 2020-09-18 LAB — VITAMIN D 25 HYDROXY (VIT D DEFICIENCY, FRACTURES): Vit D, 25-Hydroxy: 72.9 ng/mL (ref 30.0–100.0)

## 2020-09-22 ENCOUNTER — Other Ambulatory Visit: Payer: Self-pay

## 2020-09-22 ENCOUNTER — Inpatient Hospital Stay: Payer: Medicare Other

## 2020-09-22 VITALS — BP 145/66 | HR 78 | Temp 97.7°F | Resp 16 | Ht 61.0 in | Wt 145.8 lb

## 2020-09-22 DIAGNOSIS — D509 Iron deficiency anemia, unspecified: Secondary | ICD-10-CM

## 2020-09-22 MED ORDER — SODIUM CHLORIDE 0.9 % IV SOLN
Freq: Once | INTRAVENOUS | Status: AC
  Filled 2020-09-22: qty 250

## 2020-09-22 MED ORDER — SODIUM CHLORIDE 0.9 % IV SOLN
510.0000 mg | Freq: Once | INTRAVENOUS | Status: AC
  Administered 2020-09-22: 510 mg via INTRAVENOUS
  Filled 2020-09-22: qty 17

## 2020-09-22 NOTE — Progress Notes (Signed)
Pt stable at time of discharge. 

## 2020-09-24 ENCOUNTER — Other Ambulatory Visit: Payer: Self-pay | Admitting: Pharmacist

## 2020-09-29 ENCOUNTER — Inpatient Hospital Stay: Payer: Medicare Other | Attending: Oncology

## 2020-09-29 ENCOUNTER — Other Ambulatory Visit: Payer: Self-pay

## 2020-09-29 VITALS — BP 84/62 | HR 82 | Temp 97.9°F | Resp 18 | Ht 61.0 in | Wt 147.8 lb

## 2020-09-29 DIAGNOSIS — D509 Iron deficiency anemia, unspecified: Secondary | ICD-10-CM | POA: Insufficient documentation

## 2020-09-29 MED ORDER — SODIUM CHLORIDE 0.9 % IV SOLN
Freq: Once | INTRAVENOUS | Status: AC
  Filled 2020-09-29: qty 250

## 2020-09-29 MED ORDER — SODIUM CHLORIDE 0.9 % IV SOLN
510.0000 mg | Freq: Once | INTRAVENOUS | Status: AC
  Administered 2020-09-29: 510 mg via INTRAVENOUS
  Filled 2020-09-29: qty 17

## 2020-09-29 NOTE — Progress Notes (Signed)
PT STABLE AT TIME OF DISCHARGE 

## 2020-09-29 NOTE — Patient Instructions (Signed)

## 2020-09-30 DIAGNOSIS — S92355S Nondisplaced fracture of fifth metatarsal bone, left foot, sequela: Secondary | ICD-10-CM | POA: Diagnosis not present

## 2020-10-02 IMAGING — CR DG CHEST 2V
2 series · 2 of 2 positions shown · non-contrast
Comparison: 10/18/2019

CLINICAL DATA: Postop pacemaker placement

EXAM:
CHEST - 2 VIEW

[w chest pa]
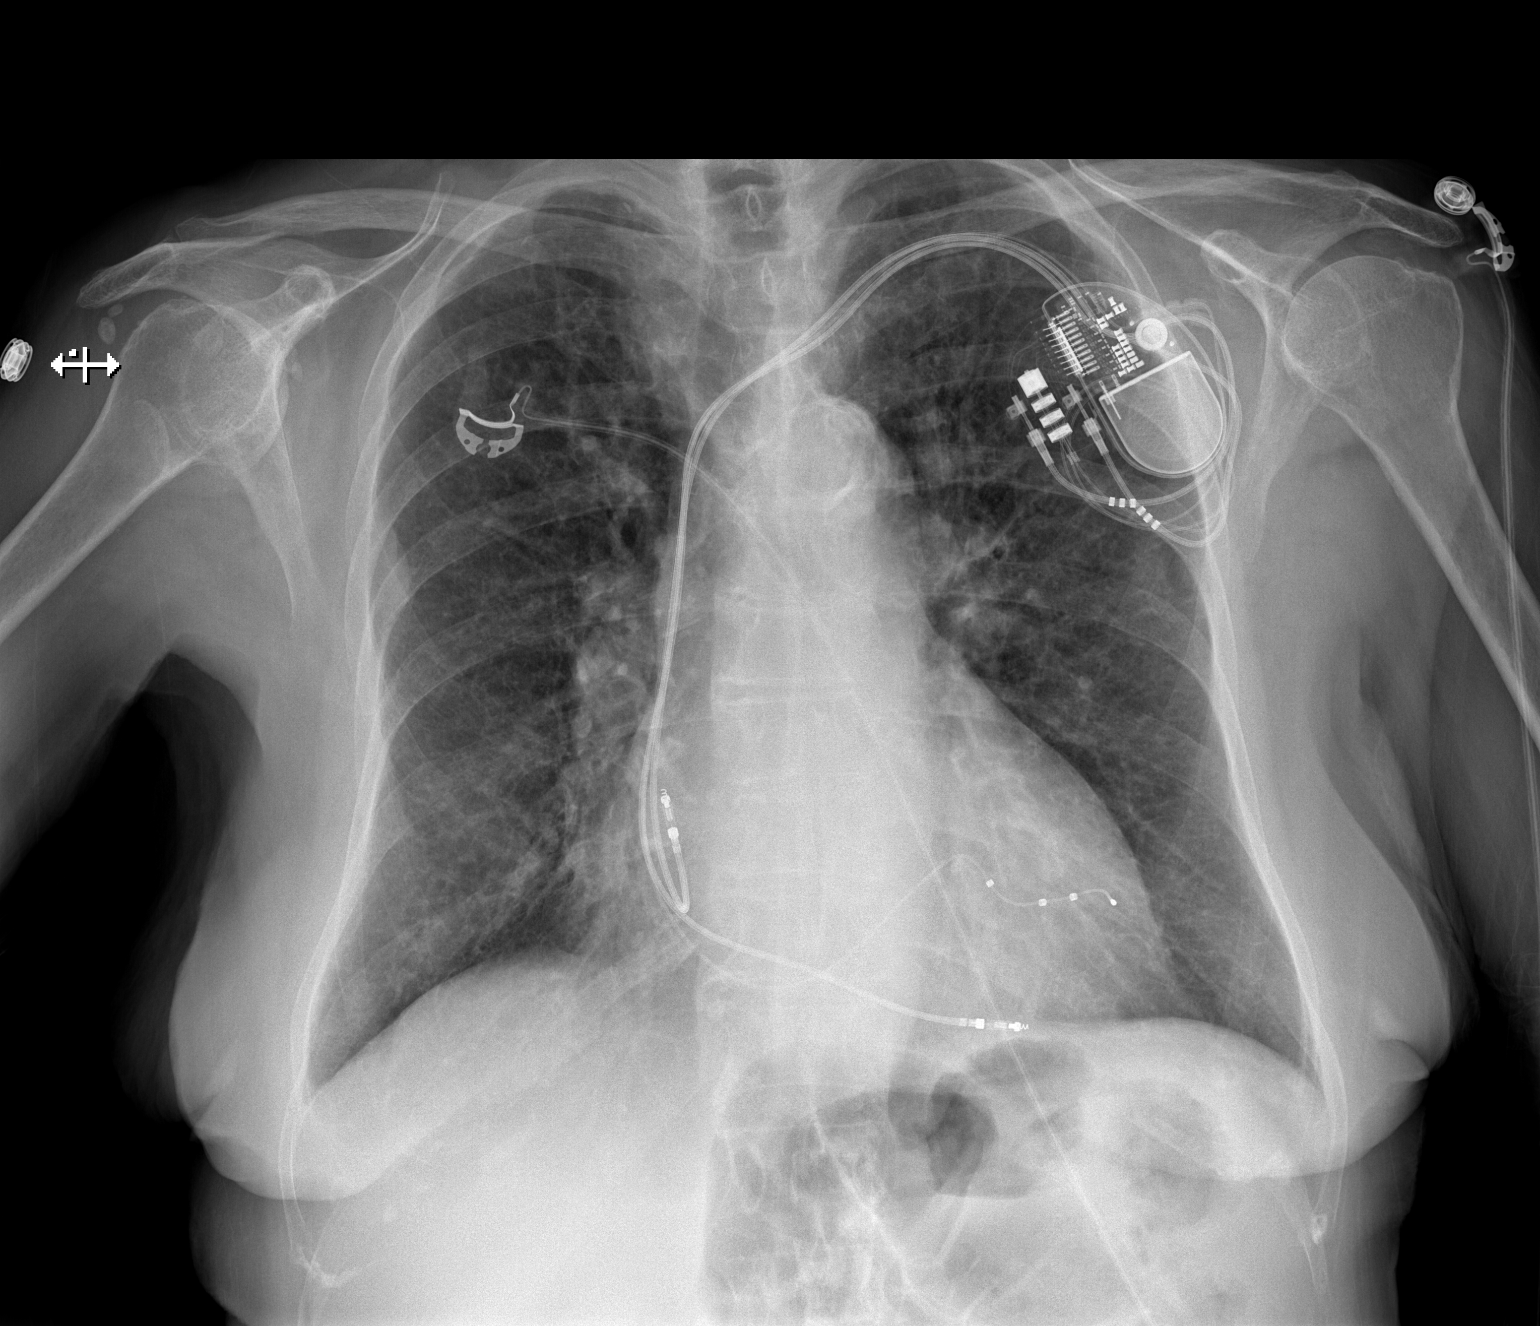

[w chest lat]
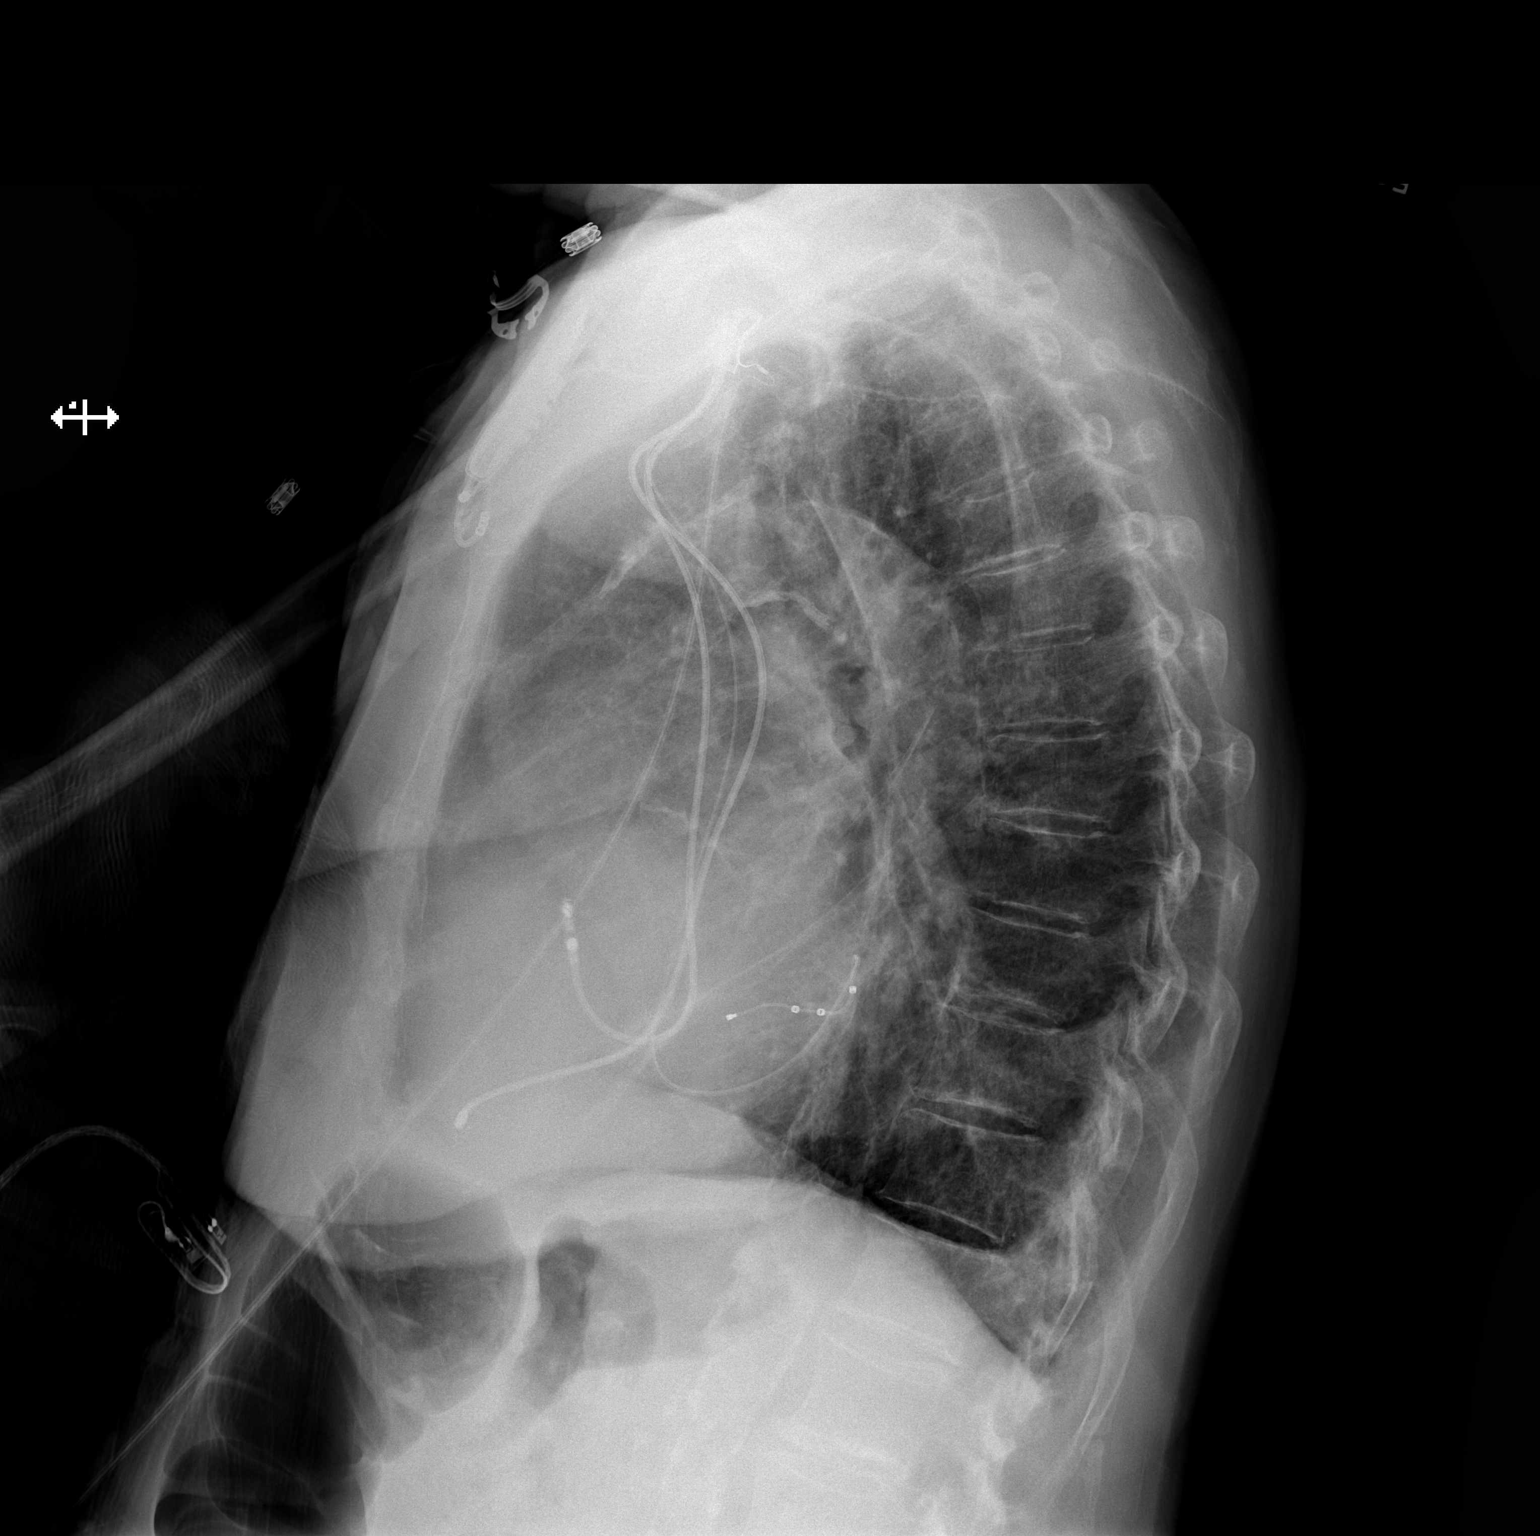

[2 of 2 positions shown; findings below may reference images not displayed]

FINDINGS: Pacemaker is been placed with leads in the right atrium, right
ventricle and coronary sinus. No pneumothorax. Mild cardiomegaly.
Lungs clear. No effusions or edema. No acute bony abnormality.
Aortic atherosclerosis.
IMPRESSION: Left pacemaker placement as above.  No pneumothorax.

Cardiomegaly, aortic atherosclerosis.

No acute findings.

## 2020-10-17 DIAGNOSIS — R0602 Shortness of breath: Secondary | ICD-10-CM | POA: Diagnosis not present

## 2020-10-17 DIAGNOSIS — Z20828 Contact with and (suspected) exposure to other viral communicable diseases: Secondary | ICD-10-CM | POA: Diagnosis not present

## 2020-10-17 DIAGNOSIS — J209 Acute bronchitis, unspecified: Secondary | ICD-10-CM | POA: Diagnosis not present

## 2020-10-17 DIAGNOSIS — R051 Acute cough: Secondary | ICD-10-CM | POA: Diagnosis not present

## 2020-10-20 DIAGNOSIS — E119 Type 2 diabetes mellitus without complications: Secondary | ICD-10-CM | POA: Diagnosis not present

## 2020-10-20 DIAGNOSIS — M79675 Pain in left toe(s): Secondary | ICD-10-CM | POA: Diagnosis not present

## 2020-10-20 DIAGNOSIS — L84 Corns and callosities: Secondary | ICD-10-CM | POA: Diagnosis not present

## 2020-10-21 ENCOUNTER — Other Ambulatory Visit: Payer: Self-pay

## 2020-10-21 ENCOUNTER — Ambulatory Visit (HOSPITAL_COMMUNITY)
Admission: RE | Admit: 2020-10-21 | Discharge: 2020-10-21 | Disposition: A | Discharge status: Home / Self Care | Payer: Medicare Other | Source: Ambulatory Visit | Attending: Cardiology | Admitting: Cardiology

## 2020-10-21 ENCOUNTER — Encounter (HOSPITAL_COMMUNITY): Payer: Self-pay | Admitting: Cardiology

## 2020-10-21 VITALS — BP 90/50 | HR 86 | Wt 148.0 lb

## 2020-10-21 DIAGNOSIS — I447 Left bundle-branch block, unspecified: Secondary | ICD-10-CM | POA: Diagnosis not present

## 2020-10-21 DIAGNOSIS — I428 Other cardiomyopathies: Secondary | ICD-10-CM | POA: Diagnosis not present

## 2020-10-21 DIAGNOSIS — I5022 Chronic systolic (congestive) heart failure: Secondary | ICD-10-CM | POA: Diagnosis not present

## 2020-10-21 DIAGNOSIS — I340 Nonrheumatic mitral (valve) insufficiency: Secondary | ICD-10-CM | POA: Diagnosis not present

## 2020-10-21 DIAGNOSIS — E785 Hyperlipidemia, unspecified: Secondary | ICD-10-CM | POA: Insufficient documentation

## 2020-10-21 DIAGNOSIS — Z79899 Other long term (current) drug therapy: Secondary | ICD-10-CM | POA: Insufficient documentation

## 2020-10-21 DIAGNOSIS — Z7951 Long term (current) use of inhaled steroids: Secondary | ICD-10-CM | POA: Insufficient documentation

## 2020-10-21 DIAGNOSIS — Z7982 Long term (current) use of aspirin: Secondary | ICD-10-CM | POA: Diagnosis not present

## 2020-10-21 DIAGNOSIS — I2510 Atherosclerotic heart disease of native coronary artery without angina pectoris: Secondary | ICD-10-CM

## 2020-10-21 DIAGNOSIS — Z8249 Family history of ischemic heart disease and other diseases of the circulatory system: Secondary | ICD-10-CM | POA: Diagnosis not present

## 2020-10-21 DIAGNOSIS — Z87891 Personal history of nicotine dependence: Secondary | ICD-10-CM | POA: Insufficient documentation

## 2020-10-21 LAB — BASIC METABOLIC PANEL
Anion gap: 11 (ref 5–15)
BUN: 35 mg/dL — ABNORMAL HIGH (ref 8–23)
CO2: 24 mmol/L (ref 22–32)
Calcium: 9.1 mg/dL (ref 8.9–10.3)
Chloride: 103 mmol/L (ref 98–111)
Creatinine, Ser: 1.22 mg/dL — ABNORMAL HIGH (ref 0.44–1.00)
GFR, Estimated: 44 mL/min — ABNORMAL LOW (ref >60)
Glucose, Bld: 121 mg/dL — ABNORMAL HIGH (ref 70–99)
Potassium: 3.7 mmol/L (ref 3.5–5.1)
Sodium: 138 mmol/L (ref 135–145)

## 2020-10-21 MED ORDER — METOPROLOL SUCCINATE ER 100 MG PO TB24
ORAL_TABLET | ORAL | 3 refills | Status: DC
Start: 2020-10-21 — End: 2022-01-19

## 2020-10-21 NOTE — Patient Instructions (Addendum)
Labs done today. We will contact you only if your labs are abnormal.  INCREASE Metoprolol XL to 100mg  (1 tablet) by mouth every morning and 50mg  (1/2 tablet) by mouth every evening.  No other medication changes were made. Please continue all other medications as prescribed.  Your physician recommends that you schedule a follow-up appointment in: 4 months  If you have any questions or concerns before your next appointment please send a message through [place] or call our office at [phone].    TO LEAVE A MESSAGE FOR THE NURSE SELECT OPTION 2, PLEASE LEAVE A MESSAGE INCLUDING: . YOUR NAME . DATE OF BIRTH . CALL BACK NUMBER . REASON FOR CALL**this is important as we prioritize the call backs  YOU WILL RECEIVE A CALL BACK THE SAME DAY AS LONG AS YOU CALL BEFORE 4:00 PM   At the [facility], you and your health needs are our priority. As part of our continuing mission to provide you with exceptional heart care, we have created designated Provider Care Teams. These Care Teams include your primary Cardiologist (physician) and Advanced Practice Providers (APPs- Physician Assistants and Nurse Practitioners) who all work together to provide you with the care you need, when you need it.   You may see any of the following providers on your designated Care Team at your next follow up: [address] [phone] . Dr Marland Kitchen . Arvilla Meres, NP . Marca Ancona, PA . Tonye Becket, PharmD   Please be sure to bring in all your medications bottles to every appointment.

## 2020-10-22 NOTE — Progress Notes (Signed)
PCP: Lucianne Lei, MD  Cardiology: Garwin Brothers, MD HF Cardiology: Dr. Shirlee Latch  84 y.o. with history of chronic systolic CHF (nonischemic cardiomyopathy), chronic LBBB, and mitral regurgitation was referred by Dr. Excell Seltzer for CHF evaluation prior to Mitraclip placement.   CHF was first noted in 12/20, she was admitted to the hospital in [facility] at that time.  LHC showed nonobstructive CAD.  She later followed up with Dr. Tomie China and had echo in 3/21, showing EF 35-40% with concern for severe MR.  TEE was then done in 4/21 and I reviewed it today, showing EF 30-35% with septal-lateral dyssynchrony, moderate-severe functional MR, normal RV, severe TR.  She has had a LBBB on ECGs in 2021, she did not have LBBB in 2019.    Cardiac MRI in 6/21 showed moderate LV dilation, EF 27%, moderately decreased RV function with EF 31%, no LGE, probably moderate MR.   She had St Jude CRT-P device implanted in 7/21.  Echo in 9/21 showed EF up to 40% with normal RV, mild-moderate MR.   She returns for followup of CHF.  Weight is stable at home though up on our scale.  She is feeling good in general.  No significant exertional dyspnea with ADLs.  She has been off digoxin for several weeks and does not note any symptomatic worsening after stopping it. No lightheadedness.  No chest pain.  BP low today (90/50) but no lightheadedness.   Labs (4/21): K 4.1, creatinine 1.16 Labs (5/21): K 3.8, creatinine 1.21 => 1.1, BNP 2424 => 2201, digoxin 0.4 Labs (6/21): K 4.6, creatinine 1.17, hgb 10.9 Labs (7/21): K 4.4, creatinine 1.11, digoxin 0.8 Labs (11/21): K 4.8, creatinine 1.22  PMH: 1. LBBB: Chronic.  2. Anemia: Prior GI workup in [place] was negative.  No history of overt GI bleeding. Fe deficiency.  3. Type 2 diabetes 4. Hyperlipidemia 5. CAD: LHC (2020) with 40% ostial left main, 50% mid RCA.  6. Chronic systolic CHF: Noted since 12/20.  Nonischemic cardiomyopathy.  - TEE (4/21): EF 30-35%, septal-lateral  dyssynchrony, moderate-severe MR appears functional, normal RV size and systolic function, severe biatrial enlargement, severe TR.  - Cardiac MRI (6/21): Moderate LV dilation, EF 27%; moderately decreased RV function with EF 31%, no LGE, probably moderate MR.  - St Jude CRT-P implanted 7/21.  - Echo (9/21): EF 40%, diffuse hypokinesis, mildly decreased RV systolic function, mild-moderate MR, normal IVC.  7. GERD 8. Hyperlipidemia 9. Hypothyroidism 10. Mitral regurgitation: Suspected to be functional.  Echo in 9/21 with improved MR, appears mild-moderate.   Social History   Socioeconomic History  . Marital status: Widowed    Spouse name: Not on file  . Number of children: Not on file  . Years of education: Not on file  . Highest education level: Not on file  Occupational History  . Not on file  Tobacco Use  . Smoking status: Former Games developer  . Smokeless tobacco: Never Used  Vaping Use  . Vaping Use: Never used  Substance and Sexual Activity  . Alcohol use: No  . Drug use: No  . Sexual activity: Not on file  Other Topics Concern  . Not on file  Social History Narrative  . Not on file   Social Determinants of Health   Financial Resource Strain: Not on file  Food Insecurity: Not on file  Transportation Needs: Not on file  Physical Activity: Not on file  Stress: Not on file  Social Connections: Not on file  Intimate Partner  Violence: Not on file   Family History  Problem Relation Age of Onset  . Diabetes Mother   . Heart disease Mother   . Hypertension Mother   . Heart attack Mother   . Emphysema Father   . Hypertension Father   . Heart attack Father   . Breast cancer Sister   . Diabetes Sister   . Emphysema Sister   . COPD Sister   . Asthma Sister   . Hypertension Sister   . Lung cancer Brother   . Diabetes Brother   . Emphysema Brother   . COPD Brother   . Asthma Brother   . Heart disease Brother    ROS: All systems reviewed and negative except as per HPI.    Current Outpatient Medications  Medication Sig Dispense Refill  . albuterol (PROVENTIL) (2.5 MG/3ML) 0.083% nebulizer solution Take 2.5 mg by nebulization every 6 (six) hours as needed for wheezing or shortness of breath.     Marland Kitchen aspirin EC 81 MG tablet Take 1 tablet (81 mg total) by mouth daily. 30 tablet 11  . b complex vitamins tablet Take 1 tablet by mouth daily.    . Calcium Carb-Cholecalciferol (CALCIUM 600 + D PO) Take 1 tablet by mouth daily.    . cetirizine (ZYRTEC) 10 MG tablet Take 10 mg by mouth daily.    . cyclobenzaprine (FLEXERIL) 10 MG tablet Take 10 mg by mouth at bedtime as needed for muscle spasms.     . empagliflozin (JARDIANCE) 10 MG TABS tablet Take 1 tablet (10 mg total) by mouth daily before breakfast. 90 tablet 3  . fexofenadine (ALLEGRA) 180 MG tablet Take 180 mg by mouth daily.    . fluticasone (FLONASE) 50 MCG/ACT nasal spray Place 2 sprays into both nostrils daily.     . Fluticasone-Salmeterol (ADVAIR) 250-50 MCG/DOSE AEPB Inhale 1 puff into the lungs 2 (two) times daily as needed (shortness of breath).     . furosemide (LASIX) 40 MG tablet Take 1 tablet (40 mg total) by mouth 2 (two) times daily. Take with 20 mg to equal 60 mg twice daily 180 tablet 3  . levalbuterol (XOPENEX HFA) 45 MCG/ACT inhaler Inhale 2 puffs into the lungs every 4 (four) hours as needed for wheezing.    Marland Kitchen levothyroxine (SYNTHROID) 88 MCG tablet Take 88 mcg by mouth daily.    . meclizine (ANTIVERT) 25 MG tablet Take 25 mg by mouth every 8 (eight) hours as needed for dizziness or nausea.     . montelukast (SINGULAIR) 10 MG tablet Take 10 mg by mouth at bedtime.     . mupirocin ointment (BACTROBAN) 2 % 1 application 2 (two) times daily as needed.    . naproxen sodium (ALEVE) 220 MG tablet Take 440 mg by mouth daily as needed (pain).     . nitroGLYCERIN (NITROSTAT) 0.4 MG SL tablet Place 1 tablet (0.4 mg total) under the tongue every 5 (five) minutes as needed for chest pain. 30 tablet 2  .  Omega-3 Fatty Acids (FISH OIL) 1000 MG CAPS Take 2,000 mg by mouth daily.    . pantoprazole (PROTONIX) 40 MG tablet Take 40 mg by mouth daily.    Bertram Gala Glycol-Propyl Glycol (SYSTANE OP) Place 1 drop into both eyes 4 (four) times daily.    . polyethylene glycol (MIRALAX / GLYCOLAX) packet Take 17 g by mouth daily as needed for mild constipation.     . potassium chloride (KLOR-CON) 10 MEQ tablet Take 1 tablet (10  mEq total) by mouth daily. 30 tablet 4  . rosuvastatin (CRESTOR) 10 MG tablet TAKE ONE-HALF (1/2) TABLET DAILY 45 tablet 3  . spironolactone (ALDACTONE) 25 MG tablet Take 1 tablet (25 mg total) by mouth at bedtime. 90 tablet 3  . vitamin E 400 UNIT capsule Take 400 Units by mouth daily.     . Wheat Dextrin (BENEFIBER) POWD Take 1 Scoop by mouth daily as needed (constipation).     . metoprolol succinate (TOPROL-XL) 100 MG 24 hr tablet Take 1 tablet (100 mg total) by mouth every morning AND 0.5 tablets (50 mg total) every evening. 135 tablet 3   No current facility-administered medications for this encounter.   BP (!) 90/50   Pulse 86   Wt 67.1 kg (148 lb)   SpO2 95%   BMI 27.96 kg/m  General: NAD Neck: No JVD, no thyromegaly or thyroid nodule.  Lungs: Clear to auscultation bilaterally with normal respiratory effort. CV: Nondisplaced PMI.  Heart regular S1/S2, no S3/S4, no murmur.  No peripheral edema.  No carotid bruit.  Normal pedal pulses.  Abdomen: Soft, nontender, no hepatosplenomegaly, no distention.  Skin: Intact without lesions or rashes.  Neurologic: Alert and oriented x 3.  Psych: Normal affect. Extremities: No clubbing or cyanosis.  HEENT: Normal.   Assessment/Plan: 1. Chronic systolic CHF: Patient has a nonischemic cardiomyopathy of uncertain etiology.  She has significant mitral regurgitation.  This is functional, and I do not think that it explains her cardiomyopathy (though mitral regurgitation likely worsens symptoms).  She has a LBBB that is new since the  prior ECG in 2019, cannot rule out LBBB cardiomyopathy.  Prior myocarditis is also a consideration.  TEE in 4/21 showed EF 30-35% with prominent septal-lateral dyssynchrony.  Cardiac MRI in 6/21 showed moderate LV dilation, EF 27%, moderately decreased RV function with EF 31%, no LGE, probably moderate MR.  St Jude CRT-P device implanted in 7/21.  Echo in 9/21 with EF up to 40%, improved MR (only mild-moderate). NYHA class II symptoms, not volume overloaded on exam.  BP remains soft, making medication titration difficult.  - Continue Lasix 60 mg bid.  BMET today.  - Continue spironolactone 25 mg daily.  - No BP room for Entresto. - She can stay off digoxin, not symptomatically worse after being off it for several weeks.   - Increase Toprol XL to 100 qam/50 qpm.   - Continue Jardiance 10 mg daily.   2. CAD: Nonobstructive on 2020 cath.  No chest pain.  - Continue ASA 81 and Crestor.   3. Mitral regurgitation: She has functional mitral regurgitation, moderate-severe/3+ at least on TEE in 4/21.  However, after CRT, mitral regurgitation was mild-moderate on 9/21 echo.   Followup in 4 months.   Loralie Champagne 10/22/2020

## 2020-10-23 ENCOUNTER — Ambulatory Visit (INDEPENDENT_AMBULATORY_CARE_PROVIDER_SITE_OTHER): Payer: Medicare Other

## 2020-10-23 DIAGNOSIS — I5022 Chronic systolic (congestive) heart failure: Secondary | ICD-10-CM | POA: Diagnosis not present

## 2020-10-23 DIAGNOSIS — I428 Other cardiomyopathies: Secondary | ICD-10-CM

## 2020-10-23 LAB — CUP PACEART REMOTE DEVICE CHECK
Battery Remaining Longevity: 88 mo
Battery Remaining Percentage: 95.5 %
Battery Voltage: 2.99 V
Brady Statistic AP VP Percent: 1 %
Brady Statistic AP VS Percent: 1 %
Brady Statistic AS VP Percent: 99 %
Brady Statistic AS VS Percent: 1 %
Brady Statistic RA Percent Paced: 1 %
Date Time Interrogation Session: 20211230020011
Implantable Lead Implant Date: 20210701
Implantable Lead Implant Date: 20210701
Implantable Lead Implant Date: 20210701
Implantable Lead Location: 753858
Implantable Lead Location: 753859
Implantable Lead Location: 753860
Implantable Pulse Generator Implant Date: 20210701
Lead Channel Impedance Value: 440 Ohm
Lead Channel Impedance Value: 580 Ohm
Lead Channel Impedance Value: 840 Ohm
Lead Channel Pacing Threshold Amplitude: 0.75 V
Lead Channel Pacing Threshold Amplitude: 0.75 V
Lead Channel Pacing Threshold Amplitude: 0.75 V
Lead Channel Pacing Threshold Pulse Width: 0.5 ms
Lead Channel Pacing Threshold Pulse Width: 0.5 ms
Lead Channel Pacing Threshold Pulse Width: 0.5 ms
Lead Channel Sensing Intrinsic Amplitude: 12 mV
Lead Channel Sensing Intrinsic Amplitude: 3.6 mV
Lead Channel Setting Pacing Amplitude: 2 V
Lead Channel Setting Pacing Amplitude: 2.5 V
Lead Channel Setting Pacing Amplitude: 2.5 V
Lead Channel Setting Pacing Pulse Width: 0.5 ms
Lead Channel Setting Pacing Pulse Width: 0.5 ms
Lead Channel Setting Sensing Sensitivity: 2 mV
Pulse Gen Model: 3562
Pulse Gen Serial Number: 3818336

## 2020-11-06 NOTE — Progress Notes (Signed)
Remote ICD transmission.   

## 2020-11-28 DIAGNOSIS — G4733 Obstructive sleep apnea (adult) (pediatric): Secondary | ICD-10-CM | POA: Diagnosis not present

## 2020-11-28 DIAGNOSIS — R5383 Other fatigue: Secondary | ICD-10-CM | POA: Diagnosis not present

## 2020-11-28 DIAGNOSIS — J4520 Mild intermittent asthma, uncomplicated: Secondary | ICD-10-CM | POA: Diagnosis not present

## 2020-11-28 DIAGNOSIS — J310 Chronic rhinitis: Secondary | ICD-10-CM | POA: Diagnosis not present

## 2020-12-05 DIAGNOSIS — D509 Iron deficiency anemia, unspecified: Secondary | ICD-10-CM | POA: Diagnosis not present

## 2020-12-05 DIAGNOSIS — Z6825 Body mass index (BMI) 25.0-25.9, adult: Secondary | ICD-10-CM | POA: Diagnosis not present

## 2020-12-05 DIAGNOSIS — E039 Hypothyroidism, unspecified: Secondary | ICD-10-CM | POA: Diagnosis not present

## 2020-12-05 DIAGNOSIS — E559 Vitamin D deficiency, unspecified: Secondary | ICD-10-CM | POA: Diagnosis not present

## 2020-12-05 DIAGNOSIS — Z79899 Other long term (current) drug therapy: Secondary | ICD-10-CM | POA: Diagnosis not present

## 2020-12-05 DIAGNOSIS — E119 Type 2 diabetes mellitus without complications: Secondary | ICD-10-CM | POA: Diagnosis not present

## 2020-12-05 DIAGNOSIS — J45909 Unspecified asthma, uncomplicated: Secondary | ICD-10-CM | POA: Diagnosis not present

## 2020-12-05 DIAGNOSIS — I10 Essential (primary) hypertension: Secondary | ICD-10-CM | POA: Diagnosis not present

## 2020-12-05 DIAGNOSIS — Z8679 Personal history of other diseases of the circulatory system: Secondary | ICD-10-CM | POA: Diagnosis not present

## 2020-12-05 DIAGNOSIS — S92355S Nondisplaced fracture of fifth metatarsal bone, left foot, sequela: Secondary | ICD-10-CM | POA: Diagnosis not present

## 2020-12-05 DIAGNOSIS — E785 Hyperlipidemia, unspecified: Secondary | ICD-10-CM | POA: Diagnosis not present

## 2020-12-05 DIAGNOSIS — Z950 Presence of cardiac pacemaker: Secondary | ICD-10-CM | POA: Diagnosis not present

## 2020-12-05 DIAGNOSIS — R413 Other amnesia: Secondary | ICD-10-CM | POA: Diagnosis not present

## 2020-12-12 DIAGNOSIS — Z79899 Other long term (current) drug therapy: Secondary | ICD-10-CM | POA: Diagnosis not present

## 2020-12-12 DIAGNOSIS — E785 Hyperlipidemia, unspecified: Secondary | ICD-10-CM | POA: Diagnosis not present

## 2020-12-12 DIAGNOSIS — E119 Type 2 diabetes mellitus without complications: Secondary | ICD-10-CM | POA: Diagnosis not present

## 2020-12-12 DIAGNOSIS — E559 Vitamin D deficiency, unspecified: Secondary | ICD-10-CM | POA: Diagnosis not present

## 2020-12-22 DIAGNOSIS — K7581 Nonalcoholic steatohepatitis (NASH): Secondary | ICD-10-CM | POA: Diagnosis not present

## 2020-12-22 DIAGNOSIS — D500 Iron deficiency anemia secondary to blood loss (chronic): Secondary | ICD-10-CM | POA: Diagnosis not present

## 2020-12-25 DIAGNOSIS — K317 Polyp of stomach and duodenum: Secondary | ICD-10-CM | POA: Diagnosis not present

## 2020-12-25 DIAGNOSIS — K7581 Nonalcoholic steatohepatitis (NASH): Secondary | ICD-10-CM | POA: Diagnosis not present

## 2020-12-25 DIAGNOSIS — R195 Other fecal abnormalities: Secondary | ICD-10-CM | POA: Diagnosis not present

## 2020-12-25 DIAGNOSIS — D500 Iron deficiency anemia secondary to blood loss (chronic): Secondary | ICD-10-CM | POA: Diagnosis not present

## 2020-12-25 DIAGNOSIS — K219 Gastro-esophageal reflux disease without esophagitis: Secondary | ICD-10-CM | POA: Diagnosis not present

## 2020-12-30 DIAGNOSIS — Z1231 Encounter for screening mammogram for malignant neoplasm of breast: Secondary | ICD-10-CM | POA: Diagnosis not present

## 2021-01-07 DIAGNOSIS — E119 Type 2 diabetes mellitus without complications: Secondary | ICD-10-CM | POA: Diagnosis not present

## 2021-01-09 DIAGNOSIS — Z0000 Encounter for general adult medical examination without abnormal findings: Secondary | ICD-10-CM | POA: Diagnosis not present

## 2021-01-09 DIAGNOSIS — E785 Hyperlipidemia, unspecified: Secondary | ICD-10-CM | POA: Diagnosis not present

## 2021-01-09 DIAGNOSIS — Z1331 Encounter for screening for depression: Secondary | ICD-10-CM | POA: Diagnosis not present

## 2021-01-09 DIAGNOSIS — Z9181 History of falling: Secondary | ICD-10-CM | POA: Diagnosis not present

## 2021-01-20 ENCOUNTER — Other Ambulatory Visit: Payer: Self-pay

## 2021-01-20 ENCOUNTER — Encounter: Payer: Self-pay | Admitting: Cardiology

## 2021-01-20 ENCOUNTER — Ambulatory Visit (INDEPENDENT_AMBULATORY_CARE_PROVIDER_SITE_OTHER): Payer: Medicare Other | Admitting: Cardiology

## 2021-01-20 VITALS — BP 98/58 | HR 88 | Ht 61.0 in | Wt 154.0 lb

## 2021-01-20 DIAGNOSIS — E559 Vitamin D deficiency, unspecified: Secondary | ICD-10-CM | POA: Diagnosis not present

## 2021-01-20 DIAGNOSIS — I2510 Atherosclerotic heart disease of native coronary artery without angina pectoris: Secondary | ICD-10-CM | POA: Diagnosis not present

## 2021-01-20 DIAGNOSIS — E039 Hypothyroidism, unspecified: Secondary | ICD-10-CM

## 2021-01-20 DIAGNOSIS — I10 Essential (primary) hypertension: Secondary | ICD-10-CM

## 2021-01-20 DIAGNOSIS — G4733 Obstructive sleep apnea (adult) (pediatric): Secondary | ICD-10-CM

## 2021-01-20 DIAGNOSIS — I447 Left bundle-branch block, unspecified: Secondary | ICD-10-CM | POA: Diagnosis not present

## 2021-01-20 DIAGNOSIS — E119 Type 2 diabetes mellitus without complications: Secondary | ICD-10-CM

## 2021-01-20 DIAGNOSIS — I428 Other cardiomyopathies: Secondary | ICD-10-CM | POA: Diagnosis not present

## 2021-01-20 DIAGNOSIS — E088 Diabetes mellitus due to underlying condition with unspecified complications: Secondary | ICD-10-CM | POA: Diagnosis not present

## 2021-01-20 MED ORDER — POTASSIUM CHLORIDE ER 10 MEQ PO TBCR: 10.0000 meq | EXTENDED_RELEASE_TABLET | Freq: Every day | ORAL | 3 refills | Status: DC

## 2021-01-20 NOTE — Progress Notes (Signed)
Cardiology Office Note:    Date:  01/20/2021   ID:  Vanessa Johnson, DOB 1936-02-18, MRN [id number]  PCP:  Vanessa Johns, MD  Cardiologist:  Vanessa Lindau, MD   Referring MD: Vanessa Johns, MD    ASSESSMENT:    1. Coronary artery disease involving native coronary artery of native heart without angina pectoris   2. Essential hypertension   3. Nonischemic cardiomyopathy ([place])   4. Left bundle branch block   5. Diabetes mellitus due to underlying condition with complication, without long-term current use of insulin ([place])   6. Obstructive sleep apnea of adult   7. Diabetes mellitus without complication ([place])    PLAN:    In order of problems listed above:  1. Coronary artery disease: Secondary prevention stressed with patient.  Importance of compliance with diet medication stressed and she vocalized understanding.  She walks on a regular basis age appropriately. 2. Nonischemic cardiomyopathy: Stable at this time.  Patient is on guideline directed medical therapy and follows our colleagues at the heart failure clinic.  We will do complete blood work today including fasting lipids and Chem-7.  She takes potassium supplementation and needs a refill and we will do this for her. 3. CRT resynchronization therapy and pacemaker: Followed by electrophysiology colleagues and stable. 4. Essential hypertension: On appropriate medications.  Blood pressure is borderline.  She is asymptomatic. 5. Mixed dyslipidemia: Lipids were reviewed and diet was expressed to her.  She vocalized understanding.  She promises to comply.  Regular exercise stressed. 6. Patient will be seen in follow-up appointment in 6 months or earlier if the patient has any concerns    Medication Adjustments/Labs and Tests Ordered: Current medicines are reviewed at length with the patient today.  Concerns regarding medicines are outlined above.  No orders of the defined types were placed in this encounter.  No orders of the defined  types were placed in this encounter.    No chief complaint on file.    History of Present Illness:    Vanessa Johnson is a 85 y.o. female.  Patient has past medical history of coronary artery disease, essential hypertension, dyslipidemia, diabetes mellitus nonischemic cardiomyopathy and CRT with defibrillator insertion.  She denies any problems at this time and takes care of activities of daily living.  No chest pain orthopnea or PND.  At the time of my evaluation, the patient is alert awake oriented and in no distress.  Past Medical History:  Diagnosis Date  . Allergic rhinitis   . Back pain    left lower back  . Benign essential HTN   . Biventricular cardiac pacemaker in situ 08/05/2020  . BMI 34.0-34.9,adult   . Bronchial asthma   . Bronchitis 11/20/2018  . CAD (coronary artery disease)    Heart cath 01/2017 [facility]  . Cardiology follow-up encounter 01/26/2017   Overview:  Added automatically from request for surgery [id number]  Formatting of this note might be different from the original. Added automatically from request for surgery [id number]  . Cholelithiasis with cholecystitis without obstruction 12/04/2015  . Diabetes mellitus ([place])   . Diabetes mellitus due to underlying condition with complication, without long-term current use of insulin ([place]) 01/15/2016   Formatting of this note might be different from the original. Added automatically from request for surgery [id number]  . Diabetes mellitus without complication ([place]) 3/87/5643  . Dysfunction of right eustachian tube 11/20/2018  . Essential hypertension 05/17/2017  . Gastritis    Dr. Orlena Johnson- EGD 2018  .  GERD (gastroesophageal reflux disease)   . Hyperlipidemia   . Hypothyroid   . Iron deficiency anemia 09/02/2020  . Ischiogluteal bursitis of left side   . Left bundle branch block 04/02/2020  . Mixed dyslipidemia 05/17/2017  . Nonischemic cardiomyopathy ([place]) 04/02/2020  . Obstructive sleep apnea of adult 12/04/2015  . Pre-ulcerative  corn or callous   . Sleep apnea    On CPAP  . Tinea pedis of right foot 01/15/2016  . Unsteadiness 08/16/2018   Last Assessment & Plan:  Emergency department follow-up for evaluation of vertigo. Acute onset of vertigo 08/12/2018.  CT head at that time was okay.  Vertiginous symptoms resolved that day.  She persists with some unsteadiness but it is generally improving.  She has chronic history of unsteady that generally is helped by meclizine.  Denies any hearing loss symptoms or ringing in the ears. EXAM sh  . Vitamin D deficiency     Past Surgical History:  Procedure Laterality Date  . BIV PACEMAKER INSERTION CRT-P N/A 04/24/2020   Procedure: BIV PACEMAKER INSERTION CRT-P;  Surgeon: Evans Lance, MD;  Location: [facility];  Service: Cardiovascular;  Laterality: N/A;  . [place], and 2000  . CATARACT EXTRACTION, BILATERAL  2016  . CHOLECYSTECTOMY  2017  . DILATION AND CURETTAGE OF UTERUS  1996  . [place]  . LEFT HEART CATH AND CORONARY ANGIOGRAPHY  01/2017   [facility]- No intervention  . TEE WITHOUT CARDIOVERSION N/A 02/21/2020   Procedure: TRANSESOPHAGEAL ECHOCARDIOGRAM (TEE);  Surgeon: Dorothy Spark, MD;  Location: [facility] ENDOSCOPY;  Service: Cardiovascular;  Laterality: N/A;  . TONSILLECTOMY  1965  . TOTAL HIP ARTHROPLASTY Bilateral    left- 2005 and right- 2010    Current Medications: Current Meds  Medication Sig  . albuterol (PROVENTIL) (2.5 MG/3ML) 0.083% nebulizer solution Take 2.5 mg by nebulization every 6 (six) hours as needed for wheezing or shortness of breath.   Marland Kitchen aspirin EC 81 MG tablet Take 1 tablet (81 mg total) by mouth daily.  Marland Kitchen b complex vitamins tablet Take 1 tablet by mouth daily.  . Calcium Carb-Cholecalciferol (CALCIUM 600 + D PO) Take 2 tablets by mouth daily.  . cetirizine (ZYRTEC) 10 MG tablet Take 10 mg by mouth daily.  . cyclobenzaprine (FLEXERIL) 10 MG tablet Take 10 mg  by mouth at bedtime as needed for muscle spasms.   . empagliflozin (JARDIANCE) 10 MG TABS tablet Take 1 tablet (10 mg total) by mouth daily before breakfast.  . fluticasone (FLONASE) 50 MCG/ACT nasal spray Place 2 sprays into both nostrils as needed for allergies or rhinitis.  . Fluticasone-Salmeterol (ADVAIR) 250-50 MCG/DOSE AEPB Inhale 1 puff into the lungs 2 (two) times daily as needed (shortness of breath).   . furosemide (LASIX) 20 MG tablet Take 20 mg by mouth 2 (two) times daily.  . furosemide (LASIX) 40 MG tablet Take 1 tablet (40 mg total) by mouth 2 (two) times daily. Take with 20 mg to equal 60 mg twice daily  . levalbuterol (XOPENEX HFA) 45 MCG/ACT inhaler Inhale 2 puffs into the lungs every 4 (four) hours as needed for wheezing.  Marland Kitchen levothyroxine (SYNTHROID) 88 MCG tablet Take 88 mcg by mouth daily.  . meclizine (ANTIVERT) 25 MG tablet Take 25 mg by mouth every 8 (eight) hours as needed for dizziness or nausea.   . metoprolol succinate (TOPROL-XL) 100 MG 24 hr tablet Take 1 tablet (100 mg total)  by mouth every morning AND 0.5 tablets (50 mg total) every evening.  . montelukast (SINGULAIR) 10 MG tablet Take 10 mg by mouth at bedtime.   . mupirocin ointment (BACTROBAN) 2 % 1 application 2 (two) times daily as needed (eczema).  . naproxen sodium (ALEVE) 220 MG tablet Take 440 mg by mouth daily as needed (pain).   . nitroGLYCERIN (NITROSTAT) 0.4 MG SL tablet Place 1 tablet (0.4 mg total) under the tongue every 5 (five) minutes as needed for chest pain.  . Omega-3 Fatty Acids (FISH OIL) 1000 MG CAPS Take 2,000 mg by mouth daily.  . pantoprazole (PROTONIX) 40 MG tablet Take 40 mg by mouth daily.  Vladimir Faster Glycol-Propyl Glycol (SYSTANE OP) Place 1 drop into both eyes 4 (four) times daily.  . polyethylene glycol (MIRALAX / GLYCOLAX) packet Take 17 g by mouth daily as needed for mild constipation.   . potassium chloride (KLOR-CON) 10 MEQ tablet Take 1 tablet (10 mEq total) by mouth daily.  .  rosuvastatin (CRESTOR) 10 MG tablet Take 5 mg by mouth daily.  Marland Kitchen spironolactone (ALDACTONE) 25 MG tablet Take 1 tablet (25 mg total) by mouth at bedtime.  . vitamin E 400 UNIT capsule Take 400 Units by mouth daily.   . Wheat Dextrin (BENEFIBER) POWD Take 1 Scoop by mouth daily as needed (constipation).      Allergies:   Tussionex pennkinetic er [hydrocod polst-cpm polst er], Cephalexin, Codeine, Sulfamethoxazole, Cefuroxime axetil, Cephalosporins, Iohexol, and Moxifloxacin   Social History   Socioeconomic History  . Marital status: Widowed    Spouse name: Not on file  . Number of children: Not on file  . Years of education: Not on file  . Highest education level: Not on file  Occupational History  . Not on file  Tobacco Use  . Smoking status: Former Research scientist (life sciences)  . Smokeless tobacco: Never Used  Vaping Use  . Vaping Use: Never used  Substance and Sexual Activity  . Alcohol use: No  . Drug use: No  . Sexual activity: Not on file  Other Topics Concern  . Not on file  Social History Narrative  . Not on file   Social Determinants of Health   Financial Resource Strain: Not on file  Food Insecurity: Not on file  Transportation Needs: Not on file  Physical Activity: Not on file  Stress: Not on file  Social Connections: Not on file     Family History: The patient's family history includes Asthma in her brother and sister; Breast cancer in her sister; COPD in her brother and sister; Diabetes in her brother, mother, and sister; Emphysema in her brother, father, and sister; Heart attack in her father and mother; Heart disease in her brother and mother; Hypertension in her father, mother, and sister; Lung cancer in her brother.  ROS:   Please see the history of present illness.    All other systems reviewed and are negative.  EKGs/Labs/Other Studies Reviewed:    The following studies were reviewed today: I discussed my findings with the patient at length.   Recent  Labs: 03/13/2020: B Natriuretic Peptide 2,200.9 09/17/2020: ALT 18; Hemoglobin 13.4; Platelets 341; TSH 1.020 10/21/2020: BUN 35; Creatinine, Ser 1.22; Potassium 3.7; Sodium 138  Recent Lipid Panel    Component Value Date/Time   CHOL 136 06/11/2019 1035   TRIG 175 (H) 06/11/2019 1035   HDL 43 06/11/2019 1035   CHOLHDL 3.2 06/11/2019 1035   LDLCALC 58 06/11/2019 1035    Physical Exam:  VS:  BP (!) 98/58   Pulse 88   Ht 5\' 1"  (1.549 m)   Wt 154 lb (69.9 kg)   SpO2 96%   BMI 29.10 kg/m     Wt Readings from Last 3 Encounters:  01/20/21 154 lb (69.9 kg)  10/21/20 148 lb (67.1 kg)  09/29/20 147 lb 12 oz (67 kg)     GEN: Patient is in no acute distress HEENT: Normal NECK: No JVD; No carotid bruits LYMPHATICS: No lymphadenopathy CARDIAC: Hear sounds regular, 2/6 systolic murmur at the apex. RESPIRATORY:  Clear to auscultation without rales, wheezing or rhonchi  ABDOMEN: Soft, non-tender, non-distended MUSCULOSKELETAL:  No edema; No deformity  SKIN: Warm and dry NEUROLOGIC:  Alert and oriented x 3 PSYCHIATRIC:  Normal affect   Signed, Vanessa Lindau, MD  01/20/2021 9:50 AM    [place]

## 2021-01-20 NOTE — Addendum Note (Signed)
Addended by: Truddie Hidden on: 01/20/2021 10:01 AM   Modules accepted: Orders

## 2021-01-20 NOTE — Patient Instructions (Signed)

## 2021-01-21 LAB — CBC WITH DIFFERENTIAL/PLATELET
Basophils Absolute: 0.1 10*3/uL (ref 0.0–0.2)
Basos: 1 %
EOS (ABSOLUTE): 0.3 10*3/uL (ref 0.0–0.4)
Eos: 5 %
Hematocrit: 44.8 % (ref 34.0–46.6)
Hemoglobin: 15.3 g/dL (ref 11.1–15.9)
Immature Grans (Abs): 0 10*3/uL (ref 0.0–0.1)
Immature Granulocytes: 0 %
Lymphocytes Absolute: 2 10*3/uL (ref 0.7–3.1)
Lymphs: 30 %
MCH: 29.1 pg (ref 26.6–33.0)
MCHC: 34.2 g/dL (ref 31.5–35.7)
MCV: 85 fL (ref 79–97)
Monocytes Absolute: 0.7 10*3/uL (ref 0.1–0.9)
Monocytes: 11 %
Neutrophils Absolute: 3.6 10*3/uL (ref 1.4–7.0)
Neutrophils: 53 %
Platelets: 294 10*3/uL (ref 150–450)
RBC: 5.25 x10E6/uL (ref 3.77–5.28)
RDW: 13.1 % (ref 11.7–15.4)
WBC: 6.7 10*3/uL (ref 3.4–10.8)

## 2021-01-21 LAB — BASIC METABOLIC PANEL
BUN/Creatinine Ratio: 23 (ref 12–28)
BUN: 29 mg/dL — ABNORMAL HIGH (ref 8–27)
CO2: 26 mmol/L (ref 20–29)
Calcium: 10 mg/dL (ref 8.7–10.3)
Chloride: 98 mmol/L (ref 96–106)
Creatinine, Ser: 1.25 mg/dL — ABNORMAL HIGH (ref 0.57–1.00)
Glucose: 96 mg/dL (ref 65–99)
Potassium: 4.5 mmol/L (ref 3.5–5.2)
Sodium: 141 mmol/L (ref 134–144)
eGFR: 43 mL/min/{1.73_m2} — ABNORMAL LOW (ref >59)

## 2021-01-21 LAB — LIPID PANEL
Chol/HDL Ratio: 4.7 ratio — ABNORMAL HIGH (ref 0.0–4.4)
Cholesterol, Total: 184 mg/dL (ref 100–199)
HDL: 39 mg/dL — ABNORMAL LOW (ref >39)
LDL Chol Calc (NIH): 96 mg/dL (ref 0–99)
Triglycerides: 291 mg/dL — ABNORMAL HIGH (ref 0–149)
VLDL Cholesterol Cal: 49 mg/dL — ABNORMAL HIGH (ref 5–40)

## 2021-01-21 LAB — HEPATIC FUNCTION PANEL
ALT: 20 IU/L (ref 0–32)
AST: 22 IU/L (ref 0–40)
Albumin: 4.8 g/dL — ABNORMAL HIGH (ref 3.6–4.6)
Alkaline Phosphatase: 119 IU/L (ref 44–121)
Bilirubin Total: 0.7 mg/dL (ref 0.0–1.2)
Bilirubin, Direct: 0.16 mg/dL (ref 0.00–0.40)
Total Protein: 7.3 g/dL (ref 6.0–8.5)

## 2021-01-21 LAB — VITAMIN D 25 HYDROXY (VIT D DEFICIENCY, FRACTURES): Vit D, 25-Hydroxy: 53 ng/mL (ref 30.0–100.0)

## 2021-01-21 LAB — TSH: TSH: 0.235 u[IU]/mL — ABNORMAL LOW (ref 0.450–4.500)

## 2021-01-22 ENCOUNTER — Ambulatory Visit (INDEPENDENT_AMBULATORY_CARE_PROVIDER_SITE_OTHER): Payer: Medicare Other

## 2021-01-22 DIAGNOSIS — I428 Other cardiomyopathies: Secondary | ICD-10-CM

## 2021-01-22 LAB — CUP PACEART REMOTE DEVICE CHECK
Battery Remaining Longevity: 94 mo
Battery Remaining Percentage: 95.5 %
Battery Voltage: 2.99 V
Brady Statistic AP VP Percent: 1 %
Brady Statistic AP VS Percent: 1 %
Brady Statistic AS VP Percent: 99 %
Brady Statistic AS VS Percent: 1 %
Brady Statistic RA Percent Paced: 1 %
Date Time Interrogation Session: 20220331020010
Implantable Lead Implant Date: 20210701
Implantable Lead Implant Date: 20210701
Implantable Lead Implant Date: 20210701
Implantable Lead Location: 753858
Implantable Lead Location: 753859
Implantable Lead Location: 753860
Implantable Pulse Generator Implant Date: 20210701
Lead Channel Impedance Value: 440 Ohm
Lead Channel Impedance Value: 580 Ohm
Lead Channel Impedance Value: 890 Ohm
Lead Channel Pacing Threshold Amplitude: 0.75 V
Lead Channel Pacing Threshold Amplitude: 0.75 V
Lead Channel Pacing Threshold Amplitude: 0.75 V
Lead Channel Pacing Threshold Pulse Width: 0.5 ms
Lead Channel Pacing Threshold Pulse Width: 0.5 ms
Lead Channel Pacing Threshold Pulse Width: 0.5 ms
Lead Channel Sensing Intrinsic Amplitude: 12 mV
Lead Channel Sensing Intrinsic Amplitude: 3.7 mV
Lead Channel Setting Pacing Amplitude: 2 V
Lead Channel Setting Pacing Amplitude: 2.5 V
Lead Channel Setting Pacing Amplitude: 2.5 V
Lead Channel Setting Pacing Pulse Width: 0.5 ms
Lead Channel Setting Pacing Pulse Width: 0.5 ms
Lead Channel Setting Sensing Sensitivity: 2 mV
Pulse Gen Model: 3562
Pulse Gen Serial Number: 3818336

## 2021-01-22 MED ORDER — ICOSAPENT ETHYL 1 G PO CAPS: 2.0000 g | ORAL_CAPSULE | Freq: Two times a day (BID) | ORAL | 12 refills | Status: DC

## 2021-01-22 NOTE — Addendum Note (Signed)
Addended by: Truddie Hidden on: 01/22/2021 01:37 PM   Modules accepted: Orders

## 2021-01-26 ENCOUNTER — Telehealth: Payer: Self-pay

## 2021-01-26 NOTE — Telephone Encounter (Signed)
PNT#[id number]  Prior Auth for Vascepa completed and faxed. Determination is pending.

## 2021-02-02 NOTE — Telephone Encounter (Signed)
PA approved for Vascepa through 01/26/98.

## 2021-02-04 NOTE — Progress Notes (Signed)
Remote pacemaker transmission.   

## 2021-02-13 DIAGNOSIS — R051 Acute cough: Secondary | ICD-10-CM | POA: Diagnosis not present

## 2021-02-13 DIAGNOSIS — J069 Acute upper respiratory infection, unspecified: Secondary | ICD-10-CM | POA: Diagnosis not present

## 2021-02-13 DIAGNOSIS — R0981 Nasal congestion: Secondary | ICD-10-CM | POA: Diagnosis not present

## 2021-02-20 ENCOUNTER — Encounter (HOSPITAL_COMMUNITY): Payer: Self-pay | Admitting: Cardiology

## 2021-02-20 ENCOUNTER — Ambulatory Visit (HOSPITAL_COMMUNITY)
Admission: RE | Admit: 2021-02-20 | Discharge: 2021-02-20 | Disposition: A | Discharge status: Home / Self Care | Payer: Medicare Other | Source: Ambulatory Visit | Attending: Cardiology | Admitting: Cardiology

## 2021-02-20 ENCOUNTER — Other Ambulatory Visit: Payer: Self-pay

## 2021-02-20 VITALS — BP 84/50 | HR 87 | Wt 154.4 lb

## 2021-02-20 DIAGNOSIS — I2510 Atherosclerotic heart disease of native coronary artery without angina pectoris: Secondary | ICD-10-CM | POA: Insufficient documentation

## 2021-02-20 DIAGNOSIS — Z79890 Hormone replacement therapy (postmenopausal): Secondary | ICD-10-CM | POA: Diagnosis not present

## 2021-02-20 DIAGNOSIS — I340 Nonrheumatic mitral (valve) insufficiency: Secondary | ICD-10-CM | POA: Insufficient documentation

## 2021-02-20 DIAGNOSIS — I428 Other cardiomyopathies: Secondary | ICD-10-CM | POA: Insufficient documentation

## 2021-02-20 DIAGNOSIS — Z87891 Personal history of nicotine dependence: Secondary | ICD-10-CM | POA: Diagnosis not present

## 2021-02-20 DIAGNOSIS — Z7951 Long term (current) use of inhaled steroids: Secondary | ICD-10-CM | POA: Insufficient documentation

## 2021-02-20 DIAGNOSIS — Z7984 Long term (current) use of oral hypoglycemic drugs: Secondary | ICD-10-CM | POA: Insufficient documentation

## 2021-02-20 DIAGNOSIS — I5022 Chronic systolic (congestive) heart failure: Secondary | ICD-10-CM | POA: Diagnosis not present

## 2021-02-20 DIAGNOSIS — Z79899 Other long term (current) drug therapy: Secondary | ICD-10-CM | POA: Insufficient documentation

## 2021-02-20 DIAGNOSIS — I447 Left bundle-branch block, unspecified: Secondary | ICD-10-CM | POA: Insufficient documentation

## 2021-02-20 DIAGNOSIS — Z7982 Long term (current) use of aspirin: Secondary | ICD-10-CM | POA: Insufficient documentation

## 2021-02-20 DIAGNOSIS — Z8249 Family history of ischemic heart disease and other diseases of the circulatory system: Secondary | ICD-10-CM | POA: Diagnosis not present

## 2021-02-20 DIAGNOSIS — I252 Old myocardial infarction: Secondary | ICD-10-CM | POA: Insufficient documentation

## 2021-02-20 HISTORY — DX: Heart failure, unspecified: I50.9

## 2021-02-20 LAB — BASIC METABOLIC PANEL
Anion gap: 10 (ref 5–15)
BUN: 40 mg/dL — ABNORMAL HIGH (ref 8–23)
CO2: 27 mmol/L (ref 22–32)
Calcium: 9.4 mg/dL (ref 8.9–10.3)
Chloride: 100 mmol/L (ref 98–111)
Creatinine, Ser: 1.24 mg/dL — ABNORMAL HIGH (ref 0.44–1.00)
GFR, Estimated: 43 mL/min — ABNORMAL LOW (ref >60)
Glucose, Bld: 87 mg/dL (ref 70–99)
Potassium: 4.6 mmol/L (ref 3.5–5.1)
Sodium: 137 mmol/L (ref 135–145)

## 2021-02-20 MED ORDER — ROSUVASTATIN CALCIUM 10 MG PO TABS: 10.0000 mg | ORAL_TABLET | Freq: Every day | ORAL | 3 refills | Status: DC

## 2021-02-20 NOTE — Patient Instructions (Signed)
Increase Crestor to 10 mg Daily  Labs done today, we will call you for abnormal results  Your physician recommends that you return for lab work in: 2 months, we have provided you a prescription to have this done locally  Your physician recommends that you schedule a follow-up appointment in: 4 months  If you have any questions or concerns before your next appointment please send [place] a message through [place] or call our office at [phone].    TO LEAVE A MESSAGE FOR THE NURSE SELECT OPTION 2, PLEASE LEAVE A MESSAGE INCLUDING: . YOUR NAME . DATE OF BIRTH . CALL BACK NUMBER . REASON FOR CALL**this is important as we prioritize the call backs  [place] AS LONG AS YOU CALL BEFORE 4:00 PM  At the [facility], you and your health needs are our priority. As part of our continuing mission to provide you with exceptional heart care, we have created designated Provider Care Teams. These Care Teams include your primary Cardiologist (physician) and Advanced Practice Providers (APPs- Physician Assistants and Nurse Practitioners) who all work together to provide you with the care you need, when you need it.   You may see any of the following providers on your designated Care Team at your next follow up: Marland Kitchen Dr Glori Bickers . Dr Loralie Champagne . Dr Vickki Muff . Darrick Grinder, NP . Lyda Jester, [place] . Audry Riles, PharmD   Please be sure to bring in all your medications bottles to every appointment.

## 2021-02-22 NOTE — Progress Notes (Signed)
PCP: [name], MD  Cardiology: [name], MD HF Cardiology: Dr. [name]  85 y.o. with history of chronic systolic CHF (nonischemic cardiomyopathy), chronic LBBB, and mitral regurgitation was referred by Dr. Burt Knack for CHF evaluation prior to Mitraclip placement.   CHF was first noted in 12/20, she was admitted to the hospital in [facility] at that time.  LHC showed nonobstructive CAD.  She later followed up with Dr. Geraldo Pitter and had echo in 3/21, showing EF 35-40% with concern for severe MR.  TEE was then done in 4/21 and I reviewed it today, showing EF 30-35% with septal-lateral dyssynchrony, moderate-severe functional MR, normal RV, severe TR.  She has had a LBBB on ECGs in 2021, she did not have LBBB in 2019.    Cardiac MRI in 6/21 showed moderate LV dilation, EF 27%, moderately decreased RV function with EF 31%, no LGE, probably moderate MR.   She had St Jude CRT-P device implanted in 7/21.  Echo in 9/21 showed EF up to 40% with normal RV, mild-moderate MR.   She returns for followup of CHF.  Weight is up about 6 lbs.  Mild dyspnea walking up hills, no dyspnea on flat ground.  No orthopnea/PND.  No chest pain.  No lightheadedness though BP is low today.  St Jude device interrogation: >99% BiV pacing, stable thoracic impedance  Labs (4/21): K 4.1, creatinine 1.16 Labs (5/21): K 3.8, creatinine 1.21 => 1.1, BNP 2424 => 2201, digoxin 0.4 Labs (6/21): K 4.6, creatinine 1.17, hgb 10.9 Labs (7/21): K 4.4, creatinine 1.11, digoxin 0.8 Labs (11/21): K 4.8, creatinine 1.22 Labs (3/22): K 4.5, creatinine 1.25, LDL 96, TGs 291  PMH: 1. LBBB: Chronic.  2. Anemia: Prior GI workup in [place] was negative.  No history of overt GI bleeding. Fe deficiency.  3. Type 2 diabetes 4. Hyperlipidemia 5. CAD: LHC (2020) with 40% ostial left main, 50% mid RCA.  6. Chronic systolic CHF: Noted since 12/20.  Nonischemic cardiomyopathy.  - TEE (4/21): EF 30-35%, septal-lateral dyssynchrony,  moderate-severe MR appears functional, normal RV size and systolic function, severe biatrial enlargement, severe TR.  - Cardiac MRI (6/21): Moderate LV dilation, EF 27%; moderately decreased RV function with EF 31%, no LGE, probably moderate MR.  - St Jude CRT-P implanted 7/21.  - Echo (9/21): EF 40%, diffuse hypokinesis, mildly decreased RV systolic function, mild-moderate MR, normal IVC.  7. GERD 8. Hyperlipidemia 9. Hypothyroidism 10. Mitral regurgitation: Suspected to be functional.  Echo in 9/21 with improved MR, appears mild-moderate.  11. Fe deficiency anemia  Social History   Socioeconomic History  . Marital status: Widowed    Spouse name: Not on file  . Number of children: Not on file  . Years of education: Not on file  . Highest education level: Not on file  Occupational History  . Not on file  Tobacco Use  . Smoking status: Former Research scientist (life sciences)  . Smokeless tobacco: Never Used  Vaping Use  . Vaping Use: Never used  Substance and Sexual Activity  . Alcohol use: No  . Drug use: No  . Sexual activity: Not on file  Other Topics Concern  . Not on file  Social History Narrative  . Not on file   Social Determinants of Health   Financial Resource Strain: Not on file  Food Insecurity: Not on file  Transportation Needs: Not on file  Physical Activity: Not on file  Stress: Not on file  Social Connections: Not on file  Intimate Partner Violence:  Not on file   Family History  Problem Relation Age of Onset  . Diabetes Mother   . Heart disease Mother   . Hypertension Mother   . Heart attack Mother   . Emphysema Father   . Hypertension Father   . Heart attack Father   . Breast cancer Sister   . Diabetes Sister   . Emphysema Sister   . COPD Sister   . Asthma Sister   . Hypertension Sister   . Lung cancer Brother   . Diabetes Brother   . Emphysema Brother   . COPD Brother   . Asthma Brother   . Heart disease Brother    ROS: All systems reviewed and negative except  as per HPI.   Current Outpatient Medications  Medication Sig Dispense Refill  . albuterol (PROVENTIL) (2.5 MG/3ML) 0.083% nebulizer solution Take 2.5 mg by nebulization every 6 (six) hours as needed for wheezing or shortness of breath.     Marland Kitchen aspirin EC 81 MG tablet Take 1 tablet (81 mg total) by mouth daily. 30 tablet 11  . b complex vitamins tablet Take 1 tablet by mouth daily.    . Calcium Carb-Cholecalciferol (CALCIUM 600 + D PO) Take 2 tablets by mouth daily.    . cetirizine (ZYRTEC) 10 MG tablet Take 10 mg by mouth daily.    . cyclobenzaprine (FLEXERIL) 10 MG tablet Take 10 mg by mouth at bedtime as needed for muscle spasms.     . empagliflozin (JARDIANCE) 10 MG TABS tablet Take 1 tablet (10 mg total) by mouth daily before breakfast. 90 tablet 3  . fluticasone (FLONASE) 50 MCG/ACT nasal spray Place 2 sprays into both nostrils as needed for allergies or rhinitis.    . Fluticasone-Salmeterol (ADVAIR) 250-50 MCG/DOSE AEPB Inhale 1 puff into the lungs 2 (two) times daily as needed (shortness of breath).     . furosemide (LASIX) 40 MG tablet Take 1 tablet (40 mg total) by mouth 2 (two) times daily. Take with 20 mg to equal 60 mg twice daily 180 tablet 3  . levalbuterol (XOPENEX HFA) 45 MCG/ACT inhaler Inhale 2 puffs into the lungs every 4 (four) hours as needed for wheezing.    Marland Kitchen levothyroxine (SYNTHROID) 88 MCG tablet Take 88 mcg by mouth daily.    . meclizine (ANTIVERT) 25 MG tablet Take 25 mg by mouth every 8 (eight) hours as needed for dizziness or nausea.     . metoprolol succinate (TOPROL-XL) 100 MG 24 hr tablet Take 1 tablet (100 mg total) by mouth every morning AND 0.5 tablets (50 mg total) every evening. 135 tablet 3  . montelukast (SINGULAIR) 10 MG tablet Take 10 mg by mouth at bedtime.     . mupirocin ointment (BACTROBAN) 2 % 1 application 2 (two) times daily as needed (eczema).    . naproxen sodium (ALEVE) 220 MG tablet Take 440 mg by mouth daily as needed (pain).     . nitroGLYCERIN  (NITROSTAT) 0.4 MG SL tablet Place 1 tablet (0.4 mg total) under the tongue every 5 (five) minutes as needed for chest pain. 30 tablet 2  . pantoprazole (PROTONIX) 40 MG tablet Take 40 mg by mouth daily.    Vladimir Faster Glycol-Propyl Glycol (SYSTANE OP) Place 1 drop into both eyes 4 (four) times daily.    . polyethylene glycol (MIRALAX / GLYCOLAX) packet Take 17 g by mouth daily as needed for mild constipation.     . potassium chloride (KLOR-CON) 10 MEQ tablet Take 1  tablet (10 mEq total) by mouth daily. 90 tablet 3  . spironolactone (ALDACTONE) 25 MG tablet Take 1 tablet (25 mg total) by mouth at bedtime. 90 tablet 3  . vitamin E 400 UNIT capsule Take 400 Units by mouth daily.     . Wheat Dextrin (BENEFIBER) POWD Take 1 Scoop by mouth daily as needed (constipation).     . rosuvastatin (CRESTOR) 10 MG tablet Take 1 tablet (10 mg total) by mouth daily. 90 tablet 3   No current facility-administered medications for this encounter.   BP (!) 84/50   Pulse 87   Wt 70 kg (154 lb 6.4 oz)   SpO2 98%   BMI 29.17 kg/m   General: NAD Neck: No JVD, no thyromegaly or thyroid nodule.  Lungs: Clear to auscultation bilaterally with normal respiratory effort. CV: Nondisplaced PMI.  Heart regular S1/S2, no S3/S4, no murmur.  No peripheral edema.  No carotid bruit.  Normal pedal pulses.  Abdomen: Soft, nontender, no hepatosplenomegaly, no distention.  Skin: Intact without lesions or rashes.  Neurologic: Alert and oriented x 3.  Psych: Normal affect. Extremities: No clubbing or cyanosis.  HEENT: Normal.   Assessment/Plan: 1. Chronic systolic CHF: Patient has a nonischemic cardiomyopathy of uncertain etiology.  She has significant mitral regurgitation.  This is functional, and I do not think that it explains her cardiomyopathy (though mitral regurgitation likely worsens symptoms).  She has a LBBB that is new since the prior ECG in 2019, cannot rule out LBBB cardiomyopathy.  Prior myocarditis is also a  consideration.  TEE in 4/21 showed EF 30-35% with prominent septal-lateral dyssynchrony.  Cardiac MRI in 6/21 showed moderate LV dilation, EF 27%, moderately decreased RV function with EF 31%, no LGE, probably moderate MR.  St Jude CRT-P device implanted in 7/21.  Echo in 9/21 with EF up to 40%, improved MR (only mild-moderate). NYHA class II symptoms, not volume overloaded on exam or by [place].  BP remains soft, making medication titration difficult.  - Continue Lasix 60 mg bid.  BMET today.  - Continue spironolactone 25 mg daily.  - No BP room for Entresto. - Continue Toprol XL 100 qam/50 qpm.   - Continue Jardiance 10 mg daily.   2. CAD: Nonobstructive on 2020 cath.  No chest pain.  - Continue ASA 81  - Goal LDL < 70, increase Crestor to 10 mg daily with lipids/LFTs in 2 months.  3. Mitral regurgitation: She has functional mitral regurgitation, moderate-severe/3+ at least on TEE in 4/21.  However, after CRT, mitral regurgitation was mild-moderate on 9/21 echo.   Followup in 4 months.   Loralie Champagne 02/22/2021

## 2021-02-25 ENCOUNTER — Other Ambulatory Visit: Payer: Self-pay | Admitting: Oncology

## 2021-02-25 DIAGNOSIS — D509 Iron deficiency anemia, unspecified: Secondary | ICD-10-CM

## 2021-02-25 NOTE — Progress Notes (Signed)
[facility]  [address] [place],  [place]  [address] [phone]  Clinic Day:  02/26/2021  Referring physician: [name], MD   HISTORY OF PRESENT ILLNESS:  The patient is a 85 y.o. female with iron deficiency anemia.  She received a 2nd course of IV Feraheme late last year to improve her iron stores and hemoglobin.  She comes in today to reassess her peripheral counts.  Since her last visit, the patient has been doing well.  She denies having any overt forms of blood loss to explain her recent iron deficiency anemia. She is being routinely followed by GI.    PHYSICAL EXAM:  Blood pressure (!) 175/77, pulse 74, temperature 98 F (36.7 C), resp. rate 16, height 5\' 1"  (1.549 m), weight 154 lb 8 oz (70.1 kg), SpO2 97 %. Wt Readings from Last 3 Encounters:  02/26/21 154 lb 8 oz (70.1 kg)  02/20/21 154 lb 6.4 oz (70 kg)  01/20/21 154 lb (69.9 kg)   Body mass index is 29.19 kg/m. Performance status (ECOG): 0 - Asymptomatic Physical Exam Constitutional:      Appearance: Normal appearance.  HENT:     Mouth/Throat:     Pharynx: Oropharynx is clear. No oropharyngeal exudate.  Cardiovascular:     Rate and Rhythm: Normal rate and regular rhythm.     Heart sounds: No murmur heard. No friction rub. No gallop.   Pulmonary:     Breath sounds: Normal breath sounds.  Chest:  Breasts:     Right: No axillary adenopathy or supraclavicular adenopathy.     Left: No axillary adenopathy or supraclavicular adenopathy.    Abdominal:     General: Bowel sounds are normal. There is no distension.     Palpations: Abdomen is soft. There is no mass.     Tenderness: There is no abdominal tenderness.  Musculoskeletal:        General: No tenderness.     Cervical back: Normal range of motion and neck supple.     Right lower leg: No edema.     Left lower leg: No edema.  Lymphadenopathy:     Cervical: No cervical adenopathy.     Right cervical: No superficial,  deep or posterior cervical adenopathy.    Left cervical: No superficial, deep or posterior cervical adenopathy.     Upper Body:     Right upper body: No supraclavicular or axillary adenopathy.     Left upper body: No supraclavicular or axillary adenopathy.     Lower Body: No right inguinal adenopathy. No left inguinal adenopathy.  Skin:    Coloration: Skin is not jaundiced.     Findings: No lesion or rash.  Neurological:     General: No focal deficit present.     Mental Status: She is alert and oriented to person, place, and time. Mental status is at baseline.  Psychiatric:        Mood and Affect: Mood normal.        Behavior: Behavior normal.        Thought Content: Thought content normal.        Judgment: Judgment normal.     LABS:    Ref. Range 02/26/2021 00:00  Sodium Latest Ref Range: 137 - 147  137  Potassium Latest Ref Range: 3.4 - 5.3  4.6  Chloride Latest Ref Range: 99 - 108  99  CO2 Latest Ref Range: 13 - 22  29 (A)  Glucose Unknown 98  BUN  Latest Ref Range: 4 - 21  33 (A)  Creatinine Latest Ref Range: 0.5 - 1.1  1.2 (A)  Calcium Latest Ref Range: 8.7 - 10.7  9.3  Alkaline Phosphatase Latest Ref Range: 25 - 125  113  Albumin Latest Ref Range: 3.5 - 5.0  4.8  AST Latest Ref Range: 13 - 35  25  ALT Latest Ref Range: 7 - 35  22  Bilirubin, Total Unknown 1.0  WBC Unknown 8.5  RBC Latest Ref Range: 3.87 - 5.11  5.2 (A)  Hemoglobin Latest Ref Range: 12.0 - 16.0  15.8  HCT Latest Ref Range: 36 - 46  47 (A)  MCV Latest Ref Range: 81 - 99  89  Platelets Latest Ref Range: 150 - 399  273    Ref. Range 02/26/2021 13:54  Iron Latest Ref Range: 28 - 170 ug/dL 121  UIBC Latest Units: ug/dL 339  TIBC Latest Ref Range: 250 - 450 ug/dL 460 (H)  Saturation Ratios Latest Ref Range: 10.4 - 31.8 % 26  Ferritin Latest Ref Range: 11 - 307 ng/mL 50    ASSESSMENT & PLAN:  Assessment/Plan:  An 85 y.o. female with iron deficiency anemia.  I am pleased her hemoglobin levels has  significantly improved after receiving her 2nd course of IV Feraheme.  Clinically, she looks great.  I will see her back in 6 months for repeat clinical assessment. The patient understands all the plans discussed today and is in agreement with them.      Vanessa Pennie Macarthur Critchley, MD

## 2021-02-26 ENCOUNTER — Inpatient Hospital Stay: Payer: Medicare Other | Attending: Oncology | Admitting: Oncology

## 2021-02-26 ENCOUNTER — Inpatient Hospital Stay: Payer: Medicare Other

## 2021-02-26 ENCOUNTER — Other Ambulatory Visit: Payer: Self-pay | Admitting: Hematology and Oncology

## 2021-02-26 ENCOUNTER — Telehealth: Payer: Self-pay | Admitting: Oncology

## 2021-02-26 ENCOUNTER — Other Ambulatory Visit: Payer: Self-pay

## 2021-02-26 VITALS — BP 175/77 | HR 74 | Temp 98.0°F | Resp 16 | Ht 61.0 in | Wt 154.5 lb

## 2021-02-26 DIAGNOSIS — D509 Iron deficiency anemia, unspecified: Secondary | ICD-10-CM | POA: Diagnosis not present

## 2021-02-26 DIAGNOSIS — D649 Anemia, unspecified: Secondary | ICD-10-CM | POA: Diagnosis not present

## 2021-02-26 LAB — IRON AND TIBC
Iron: 121 ug/dL (ref 28–170)
Saturation Ratios: 26 % (ref 10.4–31.8)
TIBC: 460 ug/dL — ABNORMAL HIGH (ref 250–450)
UIBC: 339 ug/dL

## 2021-02-26 LAB — BASIC METABOLIC PANEL
BUN: 33 — AB (ref 4–21)
CO2: 29 — AB (ref 13–22)
Chloride: 99 (ref 99–108)
Creatinine: 1.2 — AB (ref 0.5–1.1)
Glucose: 98
Potassium: 4.6 (ref 3.4–5.3)
Sodium: 137 (ref 137–147)

## 2021-02-26 LAB — CBC AND DIFFERENTIAL
HCT: 47 — AB (ref 36–46)
Hemoglobin: 15.8 (ref 12.0–16.0)
Neutrophils Absolute: 4.17
Platelets: 273 (ref 150–399)
WBC: 8.5

## 2021-02-26 LAB — HEPATIC FUNCTION PANEL
ALT: 22 (ref 7–35)
AST: 25 (ref 13–35)
Alkaline Phosphatase: 113 (ref 25–125)
Bilirubin, Total: 1

## 2021-02-26 LAB — COMPREHENSIVE METABOLIC PANEL
Albumin: 4.8 (ref 3.5–5.0)
Calcium: 9.3 (ref 8.7–10.7)

## 2021-02-26 LAB — CBC
MCV: 89 (ref 81–99)
RBC: 5.2 — AB (ref 3.87–5.11)

## 2021-02-26 LAB — FERRITIN: Ferritin: 50 ng/mL (ref 11–307)

## 2021-02-26 NOTE — Progress Notes (Unsigned)
t

## 2021-02-26 NOTE — Telephone Encounter (Signed)
Per 5/5 los next appt scheduled and given to patient 

## 2021-03-04 DIAGNOSIS — Z8679 Personal history of other diseases of the circulatory system: Secondary | ICD-10-CM | POA: Diagnosis not present

## 2021-03-04 DIAGNOSIS — J45909 Unspecified asthma, uncomplicated: Secondary | ICD-10-CM | POA: Diagnosis not present

## 2021-03-04 DIAGNOSIS — E1140 Type 2 diabetes mellitus with diabetic neuropathy, unspecified: Secondary | ICD-10-CM | POA: Diagnosis not present

## 2021-03-04 DIAGNOSIS — E559 Vitamin D deficiency, unspecified: Secondary | ICD-10-CM | POA: Diagnosis not present

## 2021-03-04 DIAGNOSIS — R413 Other amnesia: Secondary | ICD-10-CM | POA: Diagnosis not present

## 2021-03-04 DIAGNOSIS — Z950 Presence of cardiac pacemaker: Secondary | ICD-10-CM | POA: Diagnosis not present

## 2021-03-04 DIAGNOSIS — Z862 Personal history of diseases of the blood and blood-forming organs and certain disorders involving the immune mechanism: Secondary | ICD-10-CM | POA: Diagnosis not present

## 2021-03-04 DIAGNOSIS — E119 Type 2 diabetes mellitus without complications: Secondary | ICD-10-CM | POA: Diagnosis not present

## 2021-03-04 DIAGNOSIS — E785 Hyperlipidemia, unspecified: Secondary | ICD-10-CM | POA: Diagnosis not present

## 2021-03-04 DIAGNOSIS — E039 Hypothyroidism, unspecified: Secondary | ICD-10-CM | POA: Diagnosis not present

## 2021-03-04 DIAGNOSIS — J309 Allergic rhinitis, unspecified: Secondary | ICD-10-CM | POA: Diagnosis not present

## 2021-03-04 DIAGNOSIS — I10 Essential (primary) hypertension: Secondary | ICD-10-CM | POA: Diagnosis not present

## 2021-03-05 DIAGNOSIS — L570 Actinic keratosis: Secondary | ICD-10-CM | POA: Diagnosis not present

## 2021-03-05 DIAGNOSIS — L578 Other skin changes due to chronic exposure to nonionizing radiation: Secondary | ICD-10-CM | POA: Diagnosis not present

## 2021-03-05 DIAGNOSIS — L719 Rosacea, unspecified: Secondary | ICD-10-CM | POA: Diagnosis not present

## 2021-03-19 ENCOUNTER — Encounter: Payer: Self-pay | Admitting: Oncology

## 2021-03-24 DIAGNOSIS — Z79899 Other long term (current) drug therapy: Secondary | ICD-10-CM | POA: Diagnosis not present

## 2021-03-24 DIAGNOSIS — J45909 Unspecified asthma, uncomplicated: Secondary | ICD-10-CM | POA: Diagnosis not present

## 2021-03-24 DIAGNOSIS — Z6825 Body mass index (BMI) 25.0-25.9, adult: Secondary | ICD-10-CM | POA: Diagnosis not present

## 2021-03-24 DIAGNOSIS — J209 Acute bronchitis, unspecified: Secondary | ICD-10-CM | POA: Diagnosis not present

## 2021-03-27 DIAGNOSIS — R21 Rash and other nonspecific skin eruption: Secondary | ICD-10-CM | POA: Diagnosis not present

## 2021-04-15 ENCOUNTER — Other Ambulatory Visit (HOSPITAL_COMMUNITY): Payer: Self-pay | Admitting: Cardiology

## 2021-04-20 ENCOUNTER — Other Ambulatory Visit (HOSPITAL_COMMUNITY): Payer: Self-pay

## 2021-04-20 DIAGNOSIS — E782 Mixed hyperlipidemia: Secondary | ICD-10-CM

## 2021-04-22 DIAGNOSIS — E782 Mixed hyperlipidemia: Secondary | ICD-10-CM | POA: Diagnosis not present

## 2021-04-23 ENCOUNTER — Ambulatory Visit (INDEPENDENT_AMBULATORY_CARE_PROVIDER_SITE_OTHER): Payer: Medicare Other

## 2021-04-23 DIAGNOSIS — I428 Other cardiomyopathies: Secondary | ICD-10-CM | POA: Diagnosis not present

## 2021-04-23 LAB — CUP PACEART REMOTE DEVICE CHECK
Battery Remaining Longevity: 79 mo
Battery Remaining Percentage: 87 %
Battery Voltage: 2.99 V
Brady Statistic AP VP Percent: 1 %
Brady Statistic AP VS Percent: 1 %
Brady Statistic AS VP Percent: 99 %
Brady Statistic AS VS Percent: 1 %
Brady Statistic RA Percent Paced: 1 %
Date Time Interrogation Session: 20220630020011
Implantable Lead Implant Date: 20210701
Implantable Lead Implant Date: 20210701
Implantable Lead Implant Date: 20210701
Implantable Lead Location: 753858
Implantable Lead Location: 753859
Implantable Lead Location: 753860
Implantable Pulse Generator Implant Date: 20210701
Lead Channel Impedance Value: 440 Ohm
Lead Channel Impedance Value: 560 Ohm
Lead Channel Impedance Value: 840 Ohm
Lead Channel Pacing Threshold Amplitude: 0.75 V
Lead Channel Pacing Threshold Amplitude: 0.75 V
Lead Channel Pacing Threshold Amplitude: 0.75 V
Lead Channel Pacing Threshold Pulse Width: 0.5 ms
Lead Channel Pacing Threshold Pulse Width: 0.5 ms
Lead Channel Pacing Threshold Pulse Width: 0.5 ms
Lead Channel Sensing Intrinsic Amplitude: 12 mV
Lead Channel Sensing Intrinsic Amplitude: 3.4 mV
Lead Channel Setting Pacing Amplitude: 2 V
Lead Channel Setting Pacing Amplitude: 2.5 V
Lead Channel Setting Pacing Amplitude: 2.5 V
Lead Channel Setting Pacing Pulse Width: 0.5 ms
Lead Channel Setting Pacing Pulse Width: 0.5 ms
Lead Channel Setting Sensing Sensitivity: 2 mV
Pulse Gen Model: 3562
Pulse Gen Serial Number: 3818336

## 2021-04-23 LAB — LIPID PANEL
Chol/HDL Ratio: 4.4 ratio (ref 0.0–4.4)
Cholesterol, Total: 203 mg/dL — ABNORMAL HIGH (ref 100–199)
HDL: 46 mg/dL (ref >39)
LDL Chol Calc (NIH): 104 mg/dL — ABNORMAL HIGH (ref 0–99)
Triglycerides: 310 mg/dL — ABNORMAL HIGH (ref 0–149)
VLDL Cholesterol Cal: 53 mg/dL — ABNORMAL HIGH (ref 5–40)

## 2021-04-23 LAB — HEPATIC FUNCTION PANEL
ALT: 18 IU/L (ref 0–32)
AST: 18 IU/L (ref 0–40)
Albumin: 4.5 g/dL (ref 3.6–4.6)
Alkaline Phosphatase: 130 IU/L — ABNORMAL HIGH (ref 44–121)
Bilirubin Total: 0.4 mg/dL (ref 0.0–1.2)
Bilirubin, Direct: 0.12 mg/dL (ref 0.00–0.40)
Total Protein: 6.7 g/dL (ref 6.0–8.5)

## 2021-05-11 ENCOUNTER — Encounter (HOSPITAL_COMMUNITY): Payer: Self-pay | Admitting: *Deleted

## 2021-05-11 DIAGNOSIS — G4733 Obstructive sleep apnea (adult) (pediatric): Secondary | ICD-10-CM | POA: Diagnosis not present

## 2021-05-11 DIAGNOSIS — J4520 Mild intermittent asthma, uncomplicated: Secondary | ICD-10-CM | POA: Diagnosis not present

## 2021-05-11 DIAGNOSIS — R5383 Other fatigue: Secondary | ICD-10-CM | POA: Diagnosis not present

## 2021-05-11 DIAGNOSIS — J310 Chronic rhinitis: Secondary | ICD-10-CM | POA: Diagnosis not present

## 2021-05-13 NOTE — Progress Notes (Signed)
Remote ICD transmission.   

## 2021-06-04 ENCOUNTER — Telehealth: Payer: Self-pay | Admitting: Cardiology

## 2021-06-04 NOTE — Telephone Encounter (Signed)
Pt is returning call from this morning. Please advise pt further

## 2021-06-04 NOTE — Telephone Encounter (Signed)
Called pt but was unable to advise why or who called.

## 2021-06-07 DIAGNOSIS — Z20828 Contact with and (suspected) exposure to other viral communicable diseases: Secondary | ICD-10-CM | POA: Diagnosis not present

## 2021-06-07 DIAGNOSIS — R062 Wheezing: Secondary | ICD-10-CM | POA: Diagnosis not present

## 2021-06-07 DIAGNOSIS — J45901 Unspecified asthma with (acute) exacerbation: Secondary | ICD-10-CM | POA: Diagnosis not present

## 2021-06-07 DIAGNOSIS — R051 Acute cough: Secondary | ICD-10-CM | POA: Diagnosis not present

## 2021-06-07 DIAGNOSIS — J440 Chronic obstructive pulmonary disease with acute lower respiratory infection: Secondary | ICD-10-CM | POA: Diagnosis not present

## 2021-06-16 DIAGNOSIS — R413 Other amnesia: Secondary | ICD-10-CM | POA: Diagnosis not present

## 2021-06-16 DIAGNOSIS — E119 Type 2 diabetes mellitus without complications: Secondary | ICD-10-CM | POA: Diagnosis not present

## 2021-06-16 DIAGNOSIS — E785 Hyperlipidemia, unspecified: Secondary | ICD-10-CM | POA: Diagnosis not present

## 2021-06-16 DIAGNOSIS — J45909 Unspecified asthma, uncomplicated: Secondary | ICD-10-CM | POA: Diagnosis not present

## 2021-06-16 DIAGNOSIS — D509 Iron deficiency anemia, unspecified: Secondary | ICD-10-CM | POA: Diagnosis not present

## 2021-06-16 DIAGNOSIS — E559 Vitamin D deficiency, unspecified: Secondary | ICD-10-CM | POA: Diagnosis not present

## 2021-06-16 DIAGNOSIS — Z8679 Personal history of other diseases of the circulatory system: Secondary | ICD-10-CM | POA: Diagnosis not present

## 2021-06-16 DIAGNOSIS — E039 Hypothyroidism, unspecified: Secondary | ICD-10-CM | POA: Diagnosis not present

## 2021-06-16 DIAGNOSIS — Z950 Presence of cardiac pacemaker: Secondary | ICD-10-CM | POA: Diagnosis not present

## 2021-06-16 DIAGNOSIS — Z6825 Body mass index (BMI) 25.0-25.9, adult: Secondary | ICD-10-CM | POA: Diagnosis not present

## 2021-06-16 DIAGNOSIS — I10 Essential (primary) hypertension: Secondary | ICD-10-CM | POA: Diagnosis not present

## 2021-06-16 DIAGNOSIS — Z79899 Other long term (current) drug therapy: Secondary | ICD-10-CM | POA: Diagnosis not present

## 2021-06-23 ENCOUNTER — Ambulatory Visit (HOSPITAL_COMMUNITY)
Admission: RE | Admit: 2021-06-23 | Discharge: 2021-06-23 | Disposition: A | Discharge status: Home / Self Care | Payer: Medicare Other | Source: Ambulatory Visit | Attending: Cardiology | Admitting: Cardiology

## 2021-06-23 ENCOUNTER — Other Ambulatory Visit: Payer: Self-pay

## 2021-06-23 ENCOUNTER — Encounter (HOSPITAL_COMMUNITY): Payer: Self-pay | Admitting: Cardiology

## 2021-06-23 VITALS — BP 88/50 | HR 86 | Wt 162.0 lb

## 2021-06-23 DIAGNOSIS — I428 Other cardiomyopathies: Secondary | ICD-10-CM | POA: Diagnosis not present

## 2021-06-23 DIAGNOSIS — I081 Rheumatic disorders of both mitral and tricuspid valves: Secondary | ICD-10-CM | POA: Insufficient documentation

## 2021-06-23 DIAGNOSIS — Z8249 Family history of ischemic heart disease and other diseases of the circulatory system: Secondary | ICD-10-CM | POA: Insufficient documentation

## 2021-06-23 DIAGNOSIS — I5022 Chronic systolic (congestive) heart failure: Secondary | ICD-10-CM | POA: Diagnosis not present

## 2021-06-23 DIAGNOSIS — Z79899 Other long term (current) drug therapy: Secondary | ICD-10-CM | POA: Diagnosis not present

## 2021-06-23 DIAGNOSIS — Z87891 Personal history of nicotine dependence: Secondary | ICD-10-CM | POA: Diagnosis not present

## 2021-06-23 DIAGNOSIS — Z7982 Long term (current) use of aspirin: Secondary | ICD-10-CM | POA: Insufficient documentation

## 2021-06-23 DIAGNOSIS — Z7984 Long term (current) use of oral hypoglycemic drugs: Secondary | ICD-10-CM | POA: Insufficient documentation

## 2021-06-23 DIAGNOSIS — K760 Fatty (change of) liver, not elsewhere classified: Secondary | ICD-10-CM | POA: Insufficient documentation

## 2021-06-23 DIAGNOSIS — R5383 Other fatigue: Secondary | ICD-10-CM | POA: Insufficient documentation

## 2021-06-23 DIAGNOSIS — I447 Left bundle-branch block, unspecified: Secondary | ICD-10-CM | POA: Insufficient documentation

## 2021-06-23 DIAGNOSIS — E782 Mixed hyperlipidemia: Secondary | ICD-10-CM | POA: Diagnosis not present

## 2021-06-23 DIAGNOSIS — I2510 Atherosclerotic heart disease of native coronary artery without angina pectoris: Secondary | ICD-10-CM | POA: Diagnosis not present

## 2021-06-23 LAB — BASIC METABOLIC PANEL
Anion gap: 10 (ref 5–15)
BUN: 34 mg/dL — ABNORMAL HIGH (ref 8–23)
CO2: 27 mmol/L (ref 22–32)
Calcium: 9.5 mg/dL (ref 8.9–10.3)
Chloride: 100 mmol/L (ref 98–111)
Creatinine, Ser: 1.29 mg/dL — ABNORMAL HIGH (ref 0.44–1.00)
GFR, Estimated: 41 mL/min — ABNORMAL LOW (ref >60)
Glucose, Bld: 119 mg/dL — ABNORMAL HIGH (ref 70–99)
Potassium: 4.1 mmol/L (ref 3.5–5.1)
Sodium: 137 mmol/L (ref 135–145)

## 2021-06-23 MED ORDER — FUROSEMIDE 40 MG PO TABS: ORAL_TABLET | ORAL | 3 refills | Status: DC

## 2021-06-23 MED ORDER — ROSUVASTATIN CALCIUM 20 MG PO TABS: 20.0000 mg | ORAL_TABLET | Freq: Every day | ORAL | 3 refills | Status: DC

## 2021-06-23 NOTE — Progress Notes (Signed)
ReDS Vest / Clip - 06/23/21 1200       ReDS Vest / Clip   Station Marker A    Ruler Value 27    ReDS Value Range Moderate volume overload    ReDS Actual Value 37

## 2021-06-23 NOTE — Progress Notes (Signed)
PCP: Vanessa Johns, MD  Cardiology: [name], MD HF Cardiology: Dr. [name]  85 y.o. with history of chronic systolic CHF (nonischemic cardiomyopathy), chronic LBBB, and mitral regurgitation was referred by Dr. Burt Knack for CHF evaluation prior to Mitraclip placement.   CHF was first noted in 12/20, she was admitted to the hospital in [facility] at that time.  LHC showed nonobstructive CAD.  She later followed up with Dr. Geraldo Pitter and had echo in 3/21, showing EF 35-40% with concern for severe MR.  TEE was then done in 4/21 and I reviewed it today, showing EF 30-35% with septal-lateral dyssynchrony, moderate-severe functional MR, normal RV, severe TR.  She has had a LBBB on ECGs in 2021, she did not have LBBB in 2019.    Cardiac MRI in 6/21 showed moderate LV dilation, EF 27%, moderately decreased RV function with EF 31%, no LGE, probably moderate MR.   She had St Jude CRT-P device implanted in 7/21.  Echo in 9/21 showed EF up to 40% with normal RV, mild-moderate MR.   She returns for followup of CHF.  Weight is up about 8 lbs.  She gets tired after walking about 1/2 block though no problems walking around her house. She gets fatigued cleaning her house.  Overall, she feels like her CRT device has helped.  No orthopnea/PND.  No chest pain.  No lightheadedness with standing.  REDS clip 37%  Labs (4/21): K 4.1, creatinine 1.16 Labs (5/21): K 3.8, creatinine 1.21 => 1.1, BNP 2424 => 2201, digoxin 0.4 Labs (6/21): K 4.6, creatinine 1.17, hgb 10.9 Labs (7/21): K 4.4, creatinine 1.11, digoxin 0.8 Labs (11/21): K 4.8, creatinine 1.22 Labs (3/22): K 4.5, creatinine 1.25, LDL 96, TGs 291 Labs (5/22): K 4.6, creatinine 1.2 Labs (6/22): LDL 104, LFTs normal  PMH: 1. LBBB: Chronic.  2. Anemia: Prior GI workup in [place] was negative.  No history of overt GI bleeding. Fe deficiency.  3. Type 2 diabetes 4. Hyperlipidemia 5. CAD: LHC (2020) with 40% ostial left main, 50% mid RCA.  6. Chronic  systolic CHF: Noted since 12/20.  Nonischemic cardiomyopathy.  - TEE (4/21): EF 30-35%, septal-lateral dyssynchrony, moderate-severe MR appears functional, normal RV size and systolic function, severe biatrial enlargement, severe TR.  - Cardiac MRI (6/21): Moderate LV dilation, EF 27%; moderately decreased RV function with EF 31%, no LGE, probably moderate MR.  - St Jude CRT-P implanted 7/21.  - Echo (9/21): EF 40%, diffuse hypokinesis, mildly decreased RV systolic function, mild-moderate MR, normal IVC.  7. GERD 8. Hyperlipidemia 9. Hypothyroidism 10. Mitral regurgitation: Suspected to be functional.  Echo in 9/21 with improved MR, appears mild-moderate.  11. Fe deficiency anemia 12. Fatty liver  Social History   Socioeconomic History   Marital status: Widowed    Spouse name: Not on file   Number of children: Not on file   Years of education: Not on file   Highest education level: Not on file  Occupational History   Not on file  Tobacco Use   Smoking status: Former   Smokeless tobacco: Never  Vaping Use   Vaping Use: Never used  Substance and Sexual Activity   Alcohol use: No   Drug use: No   Sexual activity: Not on file  Other Topics Concern   Not on file  Social History Narrative   Not on file   Social Determinants of Health   Financial Resource Strain: Not on file  Food Insecurity: Not on file  Transportation Needs:  Not on file  Physical Activity: Not on file  Stress: Not on file  Social Connections: Not on file  Intimate Partner Violence: Not on file   Family History  Problem Relation Age of Onset   Diabetes Mother    Heart disease Mother    Hypertension Mother    Heart attack Mother    Emphysema Father    Hypertension Father    Heart attack Father    Breast cancer Sister    Diabetes Sister    Emphysema Sister    COPD Sister    Asthma Sister    Hypertension Sister    Lung cancer Brother    Diabetes Brother    Emphysema Brother    COPD Brother     Asthma Brother    Heart disease Brother    ROS: All systems reviewed and negative except as per HPI.   Current Outpatient Medications  Medication Sig Dispense Refill   albuterol (PROVENTIL) (2.5 MG/3ML) 0.083% nebulizer solution Take 2.5 mg by nebulization every 6 (six) hours as needed for wheezing or shortness of breath.      aspirin EC 81 MG tablet Take 1 tablet (81 mg total) by mouth daily. 30 tablet 11   b complex vitamins tablet Take 1 tablet by mouth daily.     Calcium Carb-Cholecalciferol (CALCIUM 600 + D PO) Take 2 tablets by mouth daily.     cetirizine (ZYRTEC) 10 MG tablet Take 10 mg by mouth daily.     cyclobenzaprine (FLEXERIL) 10 MG tablet Take 10 mg by mouth at bedtime as needed for muscle spasms.      empagliflozin (JARDIANCE) 10 MG TABS tablet Take 1 tablet (10 mg total) by mouth daily before breakfast. 90 tablet 3   fluticasone (FLONASE) 50 MCG/ACT nasal spray Place 2 sprays into both nostrils as needed for allergies or rhinitis.     Fluticasone-Salmeterol (ADVAIR) 250-50 MCG/DOSE AEPB Inhale 1 puff into the lungs 2 (two) times daily as needed (shortness of breath).      levalbuterol (XOPENEX HFA) 45 MCG/ACT inhaler Inhale 2 puffs into the lungs every 4 (four) hours as needed for wheezing.     levothyroxine (SYNTHROID) 88 MCG tablet Take 88 mcg by mouth daily before breakfast.     meclizine (ANTIVERT) 25 MG tablet Take 25 mg by mouth every 8 (eight) hours as needed for dizziness or nausea.      metoprolol succinate (TOPROL-XL) 100 MG 24 hr tablet Take 1 tablet (100 mg total) by mouth every morning AND 0.5 tablets (50 mg total) every evening. 135 tablet 3   montelukast (SINGULAIR) 10 MG tablet Take 10 mg by mouth at bedtime.      mupirocin ointment (BACTROBAN) 2 % 1 application 2 (two) times daily as needed (eczema).     naproxen sodium (ALEVE) 220 MG tablet Take 440 mg by mouth daily as needed (pain).      nitroGLYCERIN (NITROSTAT) 0.4 MG SL tablet Place 1 tablet (0.4 mg  total) under the tongue every 5 (five) minutes as needed for chest pain. 30 tablet 2   pantoprazole (PROTONIX) 40 MG tablet Take 40 mg by mouth daily.     Polyethyl Glycol-Propyl Glycol (SYSTANE OP) Place 1 drop into both eyes 4 (four) times daily.     polyethylene glycol (MIRALAX / GLYCOLAX) packet Take 17 g by mouth daily as needed for mild constipation.      potassium chloride (KLOR-CON) 10 MEQ tablet Take 1 tablet (10 mEq total) by mouth  daily. 90 tablet 3   spironolactone (ALDACTONE) 25 MG tablet TAKE 1 TABLET AT BEDTIME (DOSE CHANGE) 90 tablet 1   vitamin E 400 UNIT capsule Take 400 Units by mouth daily.      Wheat Dextrin (BENEFIBER) POWD Take 1 Scoop by mouth daily as needed (constipation).      furosemide (LASIX) 40 MG tablet Take 2 tablets (80 mg total) by mouth every morning AND 1.5 tablets (60 mg total) every evening. 315 tablet 3   rosuvastatin (CRESTOR) 20 MG tablet Take 1 tablet (20 mg total) by mouth daily. 90 tablet 3   No current facility-administered medications for this encounter.   BP (!) 88/50 (BP Location: Left Arm, Patient Position: Sitting)   Pulse 86   Wt 73.5 kg (162 lb)   SpO2 97%   BMI 30.61 kg/m   General: NAD Neck: JVP 8 cm, no thyromegaly or thyroid nodule.  Lungs: Clear to auscultation bilaterally with normal respiratory effort. CV: Nondisplaced PMI.  Heart regular S1/S2, no S3/S4, no murmur.  Trace ankle edema.  No carotid bruit.  Normal pedal pulses.  Abdomen: Soft, nontender, no hepatosplenomegaly, no distention.  Skin: Intact without lesions or rashes.  Neurologic: Alert and oriented x 3.  Psych: Normal affect. Extremities: No clubbing or cyanosis.  HEENT: Normal.   Assessment/Plan: 1. Chronic systolic CHF: Patient has a nonischemic cardiomyopathy of uncertain etiology.  She has significant mitral regurgitation.  This is functional, and I do not think that it explains her cardiomyopathy (though mitral regurgitation likely worsens symptoms).  She has  a LBBB that is new since the prior ECG in 2019, cannot rule out LBBB cardiomyopathy.  Prior myocarditis is also a consideration.  TEE in 4/21 showed EF 30-35% with prominent septal-lateral dyssynchrony.  Cardiac MRI in 6/21 showed moderate LV dilation, EF 27%, moderately decreased RV function with EF 31%, no LGE, probably moderate MR.  St Jude CRT-P device implanted in 7/21.  Echo in 9/21 with EF up to 40%, improved MR (only mild-moderate). NYHA class II-III symptoms, mild volume overload by exam and REDS clip. BP remains on the low side though she denies lightheadedness.   - Increase Lasix to 80 qam/60 qpm.  BMET today and in 10 days.  - Continue spironolactone 25 mg daily.  - No BP room for Entresto. - Continue Toprol XL 100 qam/50 qpm.   - Continue Jardiance 10 mg daily.   2. CAD: Nonobstructive on 2020 cath.  No chest pain.  - Continue ASA 81  - Goal LDL < 70, increase Crestor to 20 mg daily with lipids/LFTs in 2 months.  3. Mitral regurgitation: She has functional mitral regurgitation, moderate-severe/3+ at least on TEE in 4/21.  However, after CRT, mitral regurgitation was mild-moderate on 9/21 echo.  4. Fatty liver: Increasing Crestor to treat elevated LDL could help with this.   Followup in 4 months.   Vanessa Johnson 06/23/2021

## 2021-06-23 NOTE — Patient Instructions (Addendum)
RedsClip done today.   Labs done today. We will contact you only if your labs are abnormal.  INCREASE Crestor to '20mg'$  (1 tablet) by mouth daily.   INCREASE Lasix to '80mg'$  (2 tablets) by mouth every morning and '60mg'$  (1 & 1/2 tablets) by mouth every evening.   No other medication changes were made. Please continue all current medications as prescribed.  Your physician recommends that you schedule a follow-up appointment in: 2 months for a lab only appointment(prescription was provided to you today in office) and in 4 months.   If you have any questions or concerns before your next appointment please send [place] a message through [place] or call our office at [phone].    TO LEAVE A MESSAGE FOR THE NURSE SELECT OPTION 2, PLEASE LEAVE A MESSAGE INCLUDING: YOUR NAME DATE OF BIRTH CALL BACK NUMBER REASON FOR CALL**this is important as we prioritize the call backs  YOU WILL RECEIVE A CALL BACK THE SAME DAY AS LONG AS YOU CALL BEFORE 4:00 PM   Do the following things EVERYDAY: Weigh yourself in the morning before breakfast. Write it down and keep it in a log. Take your medicines as prescribed Eat low salt foods--Limit salt (sodium) to 2000 mg per day.  Stay as active as you can everyday Limit all fluids for the day to less than 2 liters   At the [facility], you and your health needs are our priority. As part of our continuing mission to provide you with exceptional heart care, we have created designated Provider Care Teams. These Care Teams include your primary Cardiologist (physician) and Advanced Practice Providers (APPs- Physician Assistants and Nurse Practitioners) who all work together to provide you with the care you need, when you need it.   You may see any of the following providers on your designated Care Team at your next follow up: Dr Glori Bickers Dr Haynes Kerns, NP Lyda Jester, [place] Audry Riles, PharmD   Please be sure to bring in all  your medications bottles to every appointment.

## 2021-06-29 ENCOUNTER — Other Ambulatory Visit (HOSPITAL_COMMUNITY): Payer: Self-pay | Admitting: Cardiology

## 2021-07-23 ENCOUNTER — Ambulatory Visit (INDEPENDENT_AMBULATORY_CARE_PROVIDER_SITE_OTHER): Payer: Medicare Other

## 2021-07-23 ENCOUNTER — Other Ambulatory Visit: Payer: Self-pay

## 2021-07-23 DIAGNOSIS — I428 Other cardiomyopathies: Secondary | ICD-10-CM

## 2021-07-23 DIAGNOSIS — I509 Heart failure, unspecified: Secondary | ICD-10-CM | POA: Insufficient documentation

## 2021-07-23 LAB — CUP PACEART REMOTE DEVICE CHECK
Battery Remaining Longevity: 78 mo
Battery Remaining Percentage: 84 %
Battery Voltage: 2.99 V
Brady Statistic AP VP Percent: 1 %
Brady Statistic AP VS Percent: 1 %
Brady Statistic AS VP Percent: 99 %
Brady Statistic AS VS Percent: 1 %
Brady Statistic RA Percent Paced: 1 %
Date Time Interrogation Session: 20220929020008
Implantable Lead Implant Date: 20210701
Implantable Lead Implant Date: 20210701
Implantable Lead Implant Date: 20210701
Implantable Lead Location: 753858
Implantable Lead Location: 753859
Implantable Lead Location: 753860
Implantable Pulse Generator Implant Date: 20210701
Lead Channel Impedance Value: 490 Ohm
Lead Channel Impedance Value: 580 Ohm
Lead Channel Impedance Value: 950 Ohm
Lead Channel Pacing Threshold Amplitude: 0.75 V
Lead Channel Pacing Threshold Amplitude: 0.75 V
Lead Channel Pacing Threshold Amplitude: 0.75 V
Lead Channel Pacing Threshold Pulse Width: 0.5 ms
Lead Channel Pacing Threshold Pulse Width: 0.5 ms
Lead Channel Pacing Threshold Pulse Width: 0.5 ms
Lead Channel Sensing Intrinsic Amplitude: 12 mV
Lead Channel Sensing Intrinsic Amplitude: 4.3 mV
Lead Channel Setting Pacing Amplitude: 2 V
Lead Channel Setting Pacing Amplitude: 2.5 V
Lead Channel Setting Pacing Amplitude: 2.5 V
Lead Channel Setting Pacing Pulse Width: 0.5 ms
Lead Channel Setting Pacing Pulse Width: 0.5 ms
Lead Channel Setting Sensing Sensitivity: 2 mV
Pulse Gen Model: 3562
Pulse Gen Serial Number: 3818336

## 2021-07-28 ENCOUNTER — Ambulatory Visit: Payer: Medicare Other | Admitting: Cardiology

## 2021-07-31 NOTE — Progress Notes (Addendum)
Remote pacemaker transmission.   

## 2021-08-03 ENCOUNTER — Encounter: Payer: Self-pay | Admitting: Oncology

## 2021-08-04 DIAGNOSIS — K7581 Nonalcoholic steatohepatitis (NASH): Secondary | ICD-10-CM | POA: Diagnosis not present

## 2021-08-04 DIAGNOSIS — K760 Fatty (change of) liver, not elsewhere classified: Secondary | ICD-10-CM | POA: Diagnosis not present

## 2021-08-06 DIAGNOSIS — D500 Iron deficiency anemia secondary to blood loss (chronic): Secondary | ICD-10-CM | POA: Diagnosis not present

## 2021-08-06 DIAGNOSIS — K7581 Nonalcoholic steatohepatitis (NASH): Secondary | ICD-10-CM | POA: Diagnosis not present

## 2021-08-31 NOTE — Progress Notes (Signed)
[facility]  [address] [place],  [place]  [address] [phone]  Clinic Day:  09/01/2021  Referring physician: [name], MD  This document serves as a record of services personally performed by Marice Potter, MD. It was created on their behalf by [facility], a trained medical scribe. The creation of this record is based on the scribe's personal observations and the provider's statements to them.  HISTORY OF PRESENT ILLNESS:  The patient is a 85 y.o. female with iron deficiency anemia.  She received a 2nd course of IV Feraheme in December 2021 to improve her iron stores and hemoglobin.  She comes in today to reassess her peripheral counts.  Since her last visit, the patient has been doing well.  She denies having increased fatigue or any overt forms of blood loss which concern her for recurrent iron deficiency anemia.    PHYSICAL EXAM:  Blood pressure (!) 178/77, pulse 78, temperature 98.8 F (37.1 C), resp. rate 16, height 5\' 1"  (1.549 m), weight 159 lb 14.4 oz (72.5 kg), SpO2 96 %. Wt Readings from Last 3 Encounters:  09/01/21 159 lb 14.4 oz (72.5 kg)  06/23/21 162 lb (73.5 kg)  02/26/21 154 lb 8 oz (70.1 kg)   Body mass index is 30.21 kg/m. Performance status (ECOG): 0 - Asymptomatic Physical Exam Constitutional:      Appearance: Normal appearance.  HENT:     Mouth/Throat:     Pharynx: Oropharynx is clear. No oropharyngeal exudate.  Cardiovascular:     Rate and Rhythm: Normal rate and regular rhythm.     Heart sounds: No murmur heard.   No friction rub. No gallop.  Pulmonary:     Breath sounds: Normal breath sounds.  Abdominal:     General: Bowel sounds are normal. There is no distension.     Palpations: Abdomen is soft. There is no mass.     Tenderness: There is no abdominal tenderness.  Musculoskeletal:        General: No tenderness.     Cervical back: Normal range of motion and neck supple.     Right lower leg: No  edema.     Left lower leg: No edema.  Lymphadenopathy:     Cervical: No cervical adenopathy.     Right cervical: No superficial, deep or posterior cervical adenopathy.    Left cervical: No superficial, deep or posterior cervical adenopathy.     Upper Body:     Right upper body: No supraclavicular or axillary adenopathy.     Left upper body: No supraclavicular or axillary adenopathy.     Lower Body: No right inguinal adenopathy. No left inguinal adenopathy.  Skin:    Coloration: Skin is not jaundiced.     Findings: No lesion or rash.  Neurological:     General: No focal deficit present.     Mental Status: She is alert and oriented to person, place, and time. Mental status is at baseline.  Psychiatric:        Mood and Affect: Mood normal.        Behavior: Behavior normal.        Thought Content: Thought content normal.        Judgment: Judgment normal.    LABS:     Ref. Range 09/01/2021 00:00  WBC Unknown 7.5  RBC Latest Ref Range: 3.87 - 5.11  5.18 (A)  Hemoglobin Latest Ref Range: 12.0 - 16.0  15.3  HCT Latest Ref Range: 36 -  46  45  MCV Latest Ref Range: 81-99 87  Platelets Latest Ref Range: 150 - 399  307  NEUT# Unknown 4.43    ASSESSMENT & PLAN:  Assessment/Plan:  An 85 y.o. female with a history of iron deficiency anemia.  I am pleased her hemoglobin remains ideal.  In fact, all of her hematologic parameters are normal.  Clinically, she is doing very well.  I do feel comfortable turning her care back over to her other physicians with the recommendation that her hemoglobin and iron levels be checked 2-3 times per year.  I would not have a problem seeing her back if new hematologic issues arise that require repeat clinical assessment.  The patient understands all the plans discussed today and is in agreement with them.     I, Rita Ohara, am acting as scribe for Marice Potter, MD    I have reviewed this report as typed by the medical scribe, and it is complete and  accurate.  Tanysha Quant Macarthur Critchley, MD

## 2021-09-01 ENCOUNTER — Encounter: Payer: Self-pay | Admitting: Oncology

## 2021-09-01 ENCOUNTER — Inpatient Hospital Stay: Payer: Medicare Other

## 2021-09-01 ENCOUNTER — Inpatient Hospital Stay: Payer: Medicare Other | Attending: Oncology | Admitting: Oncology

## 2021-09-01 VITALS — BP 178/77 | HR 78 | Temp 98.8°F | Resp 16 | Ht 61.0 in | Wt 159.9 lb

## 2021-09-01 DIAGNOSIS — D508 Other iron deficiency anemias: Secondary | ICD-10-CM

## 2021-09-01 DIAGNOSIS — D509 Iron deficiency anemia, unspecified: Secondary | ICD-10-CM | POA: Insufficient documentation

## 2021-09-01 DIAGNOSIS — D649 Anemia, unspecified: Secondary | ICD-10-CM | POA: Diagnosis not present

## 2021-09-01 LAB — IRON AND TIBC
Iron: 71 ug/dL (ref 28–170)
Saturation Ratios: 14 % (ref 10.4–31.8)
TIBC: 500 ug/dL — ABNORMAL HIGH (ref 250–450)
UIBC: 429 ug/dL

## 2021-09-01 LAB — CBC AND DIFFERENTIAL
HCT: 45 (ref 36–46)
Hemoglobin: 15.3 (ref 12.0–16.0)
Neutrophils Absolute: 4.43
Platelets: 307 (ref 150–399)
WBC: 7.5

## 2021-09-01 LAB — FERRITIN: Ferritin: 19 ng/mL (ref 11–307)

## 2021-09-01 LAB — CBC: RBC: 5.18 — AB (ref 3.87–5.11)

## 2021-09-01 NOTE — Addendum Note (Signed)
Addended by: Juanetta Beets on: 09/01/2021 09:41 AM   Modules accepted: Orders

## 2021-09-07 ENCOUNTER — Telehealth: Payer: Self-pay | Admitting: Oncology

## 2021-09-07 NOTE — Telephone Encounter (Signed)
No LOS Entered 

## 2021-09-23 DIAGNOSIS — E039 Hypothyroidism, unspecified: Secondary | ICD-10-CM | POA: Diagnosis not present

## 2021-09-23 DIAGNOSIS — R413 Other amnesia: Secondary | ICD-10-CM | POA: Diagnosis not present

## 2021-09-23 DIAGNOSIS — E119 Type 2 diabetes mellitus without complications: Secondary | ICD-10-CM | POA: Diagnosis not present

## 2021-09-23 DIAGNOSIS — Z950 Presence of cardiac pacemaker: Secondary | ICD-10-CM | POA: Diagnosis not present

## 2021-09-23 DIAGNOSIS — E559 Vitamin D deficiency, unspecified: Secondary | ICD-10-CM | POA: Diagnosis not present

## 2021-09-23 DIAGNOSIS — J45909 Unspecified asthma, uncomplicated: Secondary | ICD-10-CM | POA: Diagnosis not present

## 2021-09-23 DIAGNOSIS — Z8679 Personal history of other diseases of the circulatory system: Secondary | ICD-10-CM | POA: Diagnosis not present

## 2021-09-23 DIAGNOSIS — E1140 Type 2 diabetes mellitus with diabetic neuropathy, unspecified: Secondary | ICD-10-CM | POA: Diagnosis not present

## 2021-09-23 DIAGNOSIS — I10 Essential (primary) hypertension: Secondary | ICD-10-CM | POA: Diagnosis not present

## 2021-09-23 DIAGNOSIS — E785 Hyperlipidemia, unspecified: Secondary | ICD-10-CM | POA: Diagnosis not present

## 2021-09-23 DIAGNOSIS — J309 Allergic rhinitis, unspecified: Secondary | ICD-10-CM | POA: Diagnosis not present

## 2021-09-23 DIAGNOSIS — Z862 Personal history of diseases of the blood and blood-forming organs and certain disorders involving the immune mechanism: Secondary | ICD-10-CM | POA: Diagnosis not present

## 2021-09-24 ENCOUNTER — Other Ambulatory Visit: Payer: Self-pay

## 2021-10-01 ENCOUNTER — Encounter: Payer: Self-pay | Admitting: Cardiology

## 2021-10-01 ENCOUNTER — Ambulatory Visit (INDEPENDENT_AMBULATORY_CARE_PROVIDER_SITE_OTHER): Payer: Medicare Other | Admitting: Cardiology

## 2021-10-01 ENCOUNTER — Other Ambulatory Visit: Payer: Self-pay

## 2021-10-01 VITALS — BP 90/60 | HR 90 | Ht 61.0 in | Wt 160.6 lb

## 2021-10-01 DIAGNOSIS — I2510 Atherosclerotic heart disease of native coronary artery without angina pectoris: Secondary | ICD-10-CM

## 2021-10-01 DIAGNOSIS — E782 Mixed hyperlipidemia: Secondary | ICD-10-CM | POA: Diagnosis not present

## 2021-10-01 DIAGNOSIS — I10 Essential (primary) hypertension: Secondary | ICD-10-CM

## 2021-10-01 DIAGNOSIS — G4733 Obstructive sleep apnea (adult) (pediatric): Secondary | ICD-10-CM

## 2021-10-01 DIAGNOSIS — I5020 Unspecified systolic (congestive) heart failure: Secondary | ICD-10-CM | POA: Diagnosis not present

## 2021-10-01 DIAGNOSIS — E088 Diabetes mellitus due to underlying condition with unspecified complications: Secondary | ICD-10-CM

## 2021-10-01 NOTE — Progress Notes (Signed)
Cardiology Office Note:    Date:  10/01/2021   ID:  Vanessa Johnson, DOB 06-25-1936, MRN [id number]  PCP:  [name], MD  Cardiologist:  [name], MD   Referring MD: [name], MD    ASSESSMENT:    1. Coronary artery disease involving native coronary artery of native heart without angina pectoris   2. Systolic congestive heart failure, unspecified HF chronicity ([place])   3. Essential hypertension   4. Mixed hyperlipidemia   5. Diabetes mellitus due to underlying condition with complication, without long-term current use of insulin ([place])   6. Mixed dyslipidemia   7. Obstructive sleep apnea of adult    PLAN:    In order of problems listed above:  Coronary artery disease: Secondary prevention stressed with the patient.  Importance of compliance with diet medication stressed and she vocalized understanding. Essential hypertension: Blood pressure stable and diet was emphasized.  Her blood pressure is optimized.  It is borderline.  She is completely asymptomatic with this blood pressure. Biventricular cardiac pacemaker in situ: Stable followed by electrophysiology colleagues and congestive heart failure clinic records reviewed and discussed with patient and questions were answered to her satisfaction. Diabetes mellitus mixed dyslipidemia and obesity: Lifestyle modification urged and she promises to do better.  She cannot exercise much because of hip issues. Patient will be seen in follow-up appointment in 6 months or earlier if the patient has any concerns    Medication Adjustments/Labs and Tests Ordered: Current medicines are reviewed at length with the patient today.  Concerns regarding medicines are outlined above.  No orders of the defined types were placed in this encounter.  No orders of the defined types were placed in this encounter.    No chief complaint on file.    History of Present Illness:    Vanessa Johnson is a 85 y.o. female.  Patient has past medical  history of coronary artery disease, essential hypertension, mixed dyslipidemia and diabetes mellitus.  She has had history of anemia.  Appears to be stable at this time.  She is followed by her gastroenterologist for this.  No chest pain orthopnea or PND.  At the time of my evaluation, the patient is alert awake oriented and in no distress.  Past Medical History:  Diagnosis Date   Allergic rhinitis    Back pain    left lower back   Benign essential HTN    Biventricular cardiac pacemaker in situ 08/05/2020   BMI 34.0-34.9,adult    Bronchial asthma    Bronchitis 11/20/2018   CAD (coronary artery disease)    Heart cath 01/2017 [facility]   Cardiology follow-up encounter 01/26/2017   Overview:  Added automatically from request for surgery [id number]  Formatting of this note might be different from the original. Added automatically from request for surgery [id number]   CHF (congestive heart failure) ([place])    Cholelithiasis with cholecystitis without obstruction 12/04/2015   Diabetes mellitus ([place])    Diabetes mellitus due to underlying condition with complication, without long-term current use of insulin ([place]) 01/15/2016   Formatting of this note might be different from the original. Added automatically from request for surgery [id number]   Diabetes mellitus without complication ([place]) 0/86/7619   Dysfunction of right eustachian tube 11/20/2018   Essential hypertension 05/17/2017   Gastritis    Dr. Orlena Sheldon- EGD 2018   GERD (gastroesophageal reflux disease)    Hyperlipidemia    Hypothyroid    Iron deficiency anemia 09/02/2020   Ischiogluteal bursitis of  left side    Left bundle branch block 04/02/2020   Mixed dyslipidemia 05/17/2017   Nonischemic cardiomyopathy ([place]) 04/02/2020   Obstructive sleep apnea of adult 12/04/2015   Pre-ulcerative corn or callous    Sleep apnea    On CPAP   Tinea pedis of right foot 01/15/2016   Unsteadiness 08/16/2018   Last Assessment & Plan:  Emergency department follow-up for  evaluation of vertigo. Acute onset of vertigo 08/12/2018.  CT head at that time was okay.  Vertiginous symptoms resolved that day.  She persists with some unsteadiness but it is generally improving.  She has chronic history of unsteady that generally is helped by meclizine.  Denies any hearing loss symptoms or ringing in the ears. EXAM sh   Vitamin D deficiency     Past Surgical History:  Procedure Laterality Date   BIV PACEMAKER INSERTION CRT-P N/A 04/24/2020   Procedure: BIV PACEMAKER INSERTION CRT-P;  Surgeon: Evans Lance, MD;  Location: [facility];  Service: Cardiovascular;  Laterality: N/A;   BREAST BIOPSY  1970, 1980, and 2000   CATARACT EXTRACTION, BILATERAL  2016   CHOLECYSTECTOMY  2017   DILATION AND CURETTAGE OF UTERUS  1996   HEMORRHOIDECTOMY WITH HEMORRHOID BANDING  1970   LEFT HEART CATH AND CORONARY ANGIOGRAPHY  01/2017   [facility]- No intervention   TEE WITHOUT CARDIOVERSION N/A 02/21/2020   Procedure: TRANSESOPHAGEAL ECHOCARDIOGRAM (TEE);  Surgeon: Dorothy Spark, MD;  Location: [facility];  Service: Cardiovascular;  Laterality: N/A;   [place] Bilateral    left- 2005 and right- 2010    Current Medications: Current Meds  Medication Sig   albuterol (PROVENTIL) (2.5 MG/3ML) 0.083% nebulizer solution Take 2.5 mg by nebulization every 6 (six) hours as needed for wheezing or shortness of breath.    aspirin EC 81 MG tablet Take 1 tablet (81 mg total) by mouth daily.   b complex vitamins tablet Take 1 tablet by mouth daily.   Calcium Carb-Cholecalciferol (CALCIUM 600 + D PO) Take 2 tablets by mouth daily.   cetirizine (ZYRTEC) 10 MG tablet Take 10 mg by mouth daily.   cyclobenzaprine (FLEXERIL) 10 MG tablet Take 10 mg by mouth at bedtime as needed for muscle spasms.    fluticasone (FLONASE) 50 MCG/ACT nasal spray Place 2 sprays into both nostrils as needed for allergies or rhinitis.   furosemide (LASIX) 40  MG tablet Take 2 tablets (80 mg total) by mouth every morning AND 1.5 tablets (60 mg total) every evening.   JARDIANCE 10 MG TABS tablet TAKE 1 TABLET DAILY BEFORE BREAKFAST   levalbuterol (XOPENEX HFA) 45 MCG/ACT inhaler Inhale 2 puffs into the lungs every 4 (four) hours as needed for wheezing.   meclizine (ANTIVERT) 25 MG tablet Take 25 mg by mouth every 8 (eight) hours as needed for dizziness or nausea.    metFORMIN (GLUCOPHAGE) 500 MG tablet Take 500 mg by mouth daily.   metoprolol succinate (TOPROL-XL) 100 MG 24 hr tablet Take 1 tablet (100 mg total) by mouth every morning AND 0.5 tablets (50 mg total) every evening.   montelukast (SINGULAIR) 10 MG tablet Take 10 mg by mouth at bedtime.    mupirocin ointment (BACTROBAN) 2 % Apply 1 application topically 2 (two) times daily as needed for rash (eczema).   naproxen sodium (ALEVE) 220 MG tablet Take 440 mg by mouth daily as needed for pain (pain).   nitroGLYCERIN (NITROSTAT) 0.4 MG SL  tablet Place 1 tablet (0.4 mg total) under the tongue every 5 (five) minutes as needed for chest pain.   pantoprazole (PROTONIX) 40 MG tablet Take 40 mg by mouth daily.   Polyethyl Glycol-Propyl Glycol (SYSTANE OP) Place 1 drop into both eyes 4 (four) times daily.   polyethylene glycol (MIRALAX / GLYCOLAX) packet Take 17 g by mouth daily as needed for mild constipation.    potassium chloride (KLOR-CON) 10 MEQ tablet Take 1 tablet (10 mEq total) by mouth daily.   rosuvastatin (CRESTOR) 20 MG tablet Take 1 tablet (20 mg total) by mouth daily.   spironolactone (ALDACTONE) 25 MG tablet Take 25 mg by mouth daily.   TIROSINT 75 MCG CAPS Take 1 capsule by mouth daily.   vitamin E 400 UNIT capsule Take 400 Units by mouth daily.    Wheat Dextrin (BENEFIBER) POWD Take 1 Scoop by mouth daily as needed for constipation (constipation).     Allergies:   Tussionex pennkinetic er [hydrocod polst-cpm polst er], Cephalexin, Codeine, Sulfamethoxazole, Cefuroxime axetil,  Cephalosporins, Iohexol, and Moxifloxacin   Social History   Socioeconomic History   Marital status: Widowed    Spouse name: Not on file   Number of children: Not on file   Years of education: Not on file   Highest education level: Not on file  Occupational History   Not on file  Tobacco Use   Smoking status: Former   Smokeless tobacco: Never  Vaping Use   Vaping Use: Never used  Substance and Sexual Activity   Alcohol use: No   Drug use: No   Sexual activity: Not on file  Other Topics Concern   Not on file  Social History Narrative   Not on file   Social Determinants of Health   Financial Resource Strain: Not on file  Food Insecurity: Not on file  Transportation Needs: Not on file  Physical Activity: Not on file  Stress: Not on file  Social Connections: Not on file     Family History: The patient's family history includes Asthma in her brother and sister; Breast cancer in her sister; COPD in her brother and sister; Diabetes in her brother, mother, and sister; Emphysema in her brother, father, and sister; Heart attack in her father and mother; Heart disease in her brother and mother; Hypertension in her father, mother, and sister; Lung cancer in her brother.  ROS:   Please see the history of present illness.    All other systems reviewed and are negative.  EKGs/Labs/Other Studies Reviewed:    The following studies were reviewed today: I discussed my findings with the patient at length.   Recent Labs: 01/20/2021: TSH 0.235 04/22/2021: ALT 18 06/23/2021: BUN 34; Creatinine, Ser 1.29; Potassium 4.1; Sodium 137 09/01/2021: Hemoglobin 15.3; Platelets 307  Recent Lipid Panel    Component Value Date/Time   CHOL 203 (H) 04/22/2021 1028   TRIG 310 (H) 04/22/2021 1028   HDL 46 04/22/2021 1028   CHOLHDL 4.4 04/22/2021 1028   LDLCALC 104 (H) 04/22/2021 1028    Physical Exam:    VS:  BP 90/60   Pulse 90   Ht 5\' 1"  (1.549 m)   Wt 160 lb 9.6 oz (72.8 kg)   SpO2 94%    BMI 30.35 kg/m     Wt Readings from Last 3 Encounters:  10/01/21 160 lb 9.6 oz (72.8 kg)  09/01/21 159 lb 14.4 oz (72.5 kg)  06/23/21 162 lb (73.5 kg)     GEN: Patient is  in no acute distress HEENT: Normal NECK: No JVD; No carotid bruits LYMPHATICS: No lymphadenopathy CARDIAC: Hear sounds regular, 2/6 systolic murmur at the apex. RESPIRATORY:  Clear to auscultation without rales, wheezing or rhonchi  ABDOMEN: Soft, non-tender, non-distended MUSCULOSKELETAL:  No edema; No deformity  SKIN: Warm and dry NEUROLOGIC:  Alert and oriented x 3 PSYCHIATRIC:  Normal affect   Signed, [name], MD  10/01/2021 3:50 PM    [facility]

## 2021-10-01 NOTE — Patient Instructions (Signed)

## 2021-10-12 ENCOUNTER — Other Ambulatory Visit (HOSPITAL_COMMUNITY): Payer: Self-pay | Admitting: Cardiology

## 2021-10-13 ENCOUNTER — Other Ambulatory Visit: Payer: Self-pay | Admitting: Cardiology

## 2021-10-13 ENCOUNTER — Other Ambulatory Visit: Payer: Self-pay | Admitting: *Deleted

## 2021-10-13 DIAGNOSIS — I5020 Unspecified systolic (congestive) heart failure: Secondary | ICD-10-CM

## 2021-10-14 LAB — BASIC METABOLIC PANEL
BUN/Creatinine Ratio: 36 — ABNORMAL HIGH (ref 12–28)
BUN: 47 mg/dL — ABNORMAL HIGH (ref 8–27)
CO2: 27 mmol/L (ref 20–29)
Calcium: 10 mg/dL (ref 8.7–10.3)
Chloride: 95 mmol/L — ABNORMAL LOW (ref 96–106)
Creatinine, Ser: 1.31 mg/dL — ABNORMAL HIGH (ref 0.57–1.00)
Glucose: 181 mg/dL — ABNORMAL HIGH (ref 70–99)
Potassium: 5.2 mmol/L (ref 3.5–5.2)
Sodium: 138 mmol/L (ref 134–144)
eGFR: 40 mL/min/{1.73_m2} — ABNORMAL LOW (ref >59)

## 2021-10-16 ENCOUNTER — Telehealth (HOSPITAL_COMMUNITY): Payer: Self-pay | Admitting: Surgery

## 2021-10-16 ENCOUNTER — Telehealth (HOSPITAL_COMMUNITY): Payer: Self-pay

## 2021-10-16 NOTE — Telephone Encounter (Addendum)
Pt aware, agreeable, and verbalized understanding   ----- Message from Larey Dresser, MD sent at 10/16/2021  2:14 PM EST ----- Stop KCl.  Repeat BMET in 10 days.

## 2021-10-16 NOTE — Telephone Encounter (Signed)
-----   Message from Larey Dresser, MD sent at 10/16/2021  2:14 PM EST ----- Stop KCl.  Repeat BMET in 10 days.

## 2021-10-16 NOTE — Telephone Encounter (Signed)
I attempted to reach patient regarding labwork and recommendations per provider.  I was unable to reach her however I left a message for a return call.

## 2021-10-22 ENCOUNTER — Ambulatory Visit (INDEPENDENT_AMBULATORY_CARE_PROVIDER_SITE_OTHER): Payer: Medicare Other

## 2021-10-22 DIAGNOSIS — I428 Other cardiomyopathies: Secondary | ICD-10-CM | POA: Diagnosis not present

## 2021-10-22 LAB — CUP PACEART REMOTE DEVICE CHECK
Battery Remaining Longevity: 74 mo
Battery Remaining Percentage: 81 %
Battery Voltage: 2.99 V
Brady Statistic AP VP Percent: 1 %
Brady Statistic AP VS Percent: 1 %
Brady Statistic AS VP Percent: 99 %
Brady Statistic AS VS Percent: 1 %
Brady Statistic RA Percent Paced: 1 %
Date Time Interrogation Session: 20221229020008
Implantable Lead Implant Date: 20210701
Implantable Lead Implant Date: 20210701
Implantable Lead Implant Date: 20210701
Implantable Lead Location: 753858
Implantable Lead Location: 753859
Implantable Lead Location: 753860
Implantable Pulse Generator Implant Date: 20210701
Lead Channel Impedance Value: 490 Ohm
Lead Channel Impedance Value: 540 Ohm
Lead Channel Impedance Value: 840 Ohm
Lead Channel Pacing Threshold Amplitude: 0.75 V
Lead Channel Pacing Threshold Amplitude: 0.75 V
Lead Channel Pacing Threshold Amplitude: 0.75 V
Lead Channel Pacing Threshold Pulse Width: 0.5 ms
Lead Channel Pacing Threshold Pulse Width: 0.5 ms
Lead Channel Pacing Threshold Pulse Width: 0.5 ms
Lead Channel Sensing Intrinsic Amplitude: 12 mV
Lead Channel Sensing Intrinsic Amplitude: 4.8 mV
Lead Channel Setting Pacing Amplitude: 2 V
Lead Channel Setting Pacing Amplitude: 2.5 V
Lead Channel Setting Pacing Amplitude: 2.5 V
Lead Channel Setting Pacing Pulse Width: 0.5 ms
Lead Channel Setting Pacing Pulse Width: 0.5 ms
Lead Channel Setting Sensing Sensitivity: 2 mV
Pulse Gen Model: 3562
Pulse Gen Serial Number: 3818336

## 2021-10-23 ENCOUNTER — Encounter (HOSPITAL_COMMUNITY): Payer: Self-pay | Admitting: Cardiology

## 2021-10-23 ENCOUNTER — Ambulatory Visit (HOSPITAL_COMMUNITY)
Admission: RE | Admit: 2021-10-23 | Discharge: 2021-10-23 | Disposition: A | Discharge status: Home / Self Care | Payer: Medicare Other | Source: Ambulatory Visit | Attending: Cardiology | Admitting: Cardiology

## 2021-10-23 ENCOUNTER — Other Ambulatory Visit (HOSPITAL_COMMUNITY): Payer: Self-pay

## 2021-10-23 ENCOUNTER — Other Ambulatory Visit: Payer: Self-pay

## 2021-10-23 VITALS — BP 162/78 | HR 87 | Wt 158.6 lb

## 2021-10-23 DIAGNOSIS — I5022 Chronic systolic (congestive) heart failure: Secondary | ICD-10-CM | POA: Insufficient documentation

## 2021-10-23 DIAGNOSIS — Z950 Presence of cardiac pacemaker: Secondary | ICD-10-CM | POA: Insufficient documentation

## 2021-10-23 DIAGNOSIS — I771 Stricture of artery: Secondary | ICD-10-CM | POA: Diagnosis not present

## 2021-10-23 DIAGNOSIS — E875 Hyperkalemia: Secondary | ICD-10-CM | POA: Diagnosis not present

## 2021-10-23 DIAGNOSIS — I081 Rheumatic disorders of both mitral and tricuspid valves: Secondary | ICD-10-CM | POA: Insufficient documentation

## 2021-10-23 DIAGNOSIS — I447 Left bundle-branch block, unspecified: Secondary | ICD-10-CM | POA: Diagnosis not present

## 2021-10-23 DIAGNOSIS — I5020 Unspecified systolic (congestive) heart failure: Secondary | ICD-10-CM

## 2021-10-23 DIAGNOSIS — K760 Fatty (change of) liver, not elsewhere classified: Secondary | ICD-10-CM | POA: Diagnosis not present

## 2021-10-23 DIAGNOSIS — Z79899 Other long term (current) drug therapy: Secondary | ICD-10-CM | POA: Diagnosis not present

## 2021-10-23 DIAGNOSIS — R0789 Other chest pain: Secondary | ICD-10-CM | POA: Diagnosis not present

## 2021-10-23 DIAGNOSIS — E1122 Type 2 diabetes mellitus with diabetic chronic kidney disease: Secondary | ICD-10-CM | POA: Diagnosis not present

## 2021-10-23 DIAGNOSIS — N183 Chronic kidney disease, stage 3 unspecified: Secondary | ICD-10-CM | POA: Insufficient documentation

## 2021-10-23 DIAGNOSIS — Z7982 Long term (current) use of aspirin: Secondary | ICD-10-CM | POA: Insufficient documentation

## 2021-10-23 DIAGNOSIS — I428 Other cardiomyopathies: Secondary | ICD-10-CM | POA: Insufficient documentation

## 2021-10-23 DIAGNOSIS — I2510 Atherosclerotic heart disease of native coronary artery without angina pectoris: Secondary | ICD-10-CM | POA: Diagnosis not present

## 2021-10-23 DIAGNOSIS — Z7984 Long term (current) use of oral hypoglycemic drugs: Secondary | ICD-10-CM | POA: Diagnosis not present

## 2021-10-23 LAB — BASIC METABOLIC PANEL
Anion gap: 12 (ref 5–15)
BUN: 24 mg/dL — ABNORMAL HIGH (ref 8–23)
CO2: 28 mmol/L (ref 22–32)
Calcium: 9.6 mg/dL (ref 8.9–10.3)
Chloride: 99 mmol/L (ref 98–111)
Creatinine, Ser: 1.26 mg/dL — ABNORMAL HIGH (ref 0.44–1.00)
GFR, Estimated: 42 mL/min — ABNORMAL LOW (ref >60)
Glucose, Bld: 157 mg/dL — ABNORMAL HIGH (ref 70–99)
Potassium: 4.1 mmol/L (ref 3.5–5.1)
Sodium: 139 mmol/L (ref 135–145)

## 2021-10-23 MED ORDER — NITROGLYCERIN 0.4 MG SL SUBL
0.4000 mg | SUBLINGUAL_TABLET | SUBLINGUAL | 2 refills | Status: AC | PRN
Start: 2021-10-23 — End: ?

## 2021-10-23 MED ORDER — FUROSEMIDE 40 MG PO TABS: 80.0000 mg | ORAL_TABLET | Freq: Every morning | ORAL | 3 refills | Status: DC

## 2021-10-23 MED ORDER — ENTRESTO 24-26 MG PO TABS: 1.0000 | ORAL_TABLET | Freq: Two times a day (BID) | ORAL | 3 refills | Status: DC

## 2021-10-23 NOTE — Patient Instructions (Signed)
Medication Changes:  Start Entresto 24/26 Twice daily  Decrease lasix to 80mg  daily  Stop taking Aleve, Naproxen, You can [place] Tylenol   Lab Work:  Labs done today, your results will be available in MyChart, we will contact you for abnormal readings.   Testing/Procedures:  Your physician has requested that you have an echocardiogram. Echocardiography is a painless test that uses sound waves to create images of your heart. It provides your doctor with information about the size and shape of your heart and how well your hearts chambers and valves are working. This procedure takes approximately one hour. There are no restrictions for this procedure.  Your physician has requested that you have a carotid duplex. This test is an ultrasound of the carotid arteries in your neck. It looks at blood flow through these arteries that supply the brain with blood. Allow one hour for this exam. There are no restrictions or special instructions.   Repeat Labs in 10 days  Referrals:  none  Special Instructions // Education:  none  Follow-Up in: 3 months  At the [facility], you and your health needs are our priority. We have a designated team specialized in the treatment of Heart Failure. This Care Team includes your primary Heart Failure Specialized Cardiologist (physician), Advanced Practice Providers (APPs- Physician Assistants and Nurse Practitioners), and Pharmacist who all work together to provide you with the care you need, when you need it.   You may see any of the following providers on your designated Care Team at your next follow up:  Dr Glori Bickers Dr Haynes Kerns, NP Lyda Jester, [place] [facility] [place], [place] Audry Riles, PharmD   Please be sure to bring in all your medications bottles to every appointment.   Need to Contact us:  If you have any questions or concerns before your next appointment please send [place] a message  through [place] or call our office at [phone].    TO LEAVE A MESSAGE FOR THE NURSE SELECT OPTION 2, PLEASE LEAVE A MESSAGE INCLUDING: YOUR NAME DATE OF BIRTH CALL BACK NUMBER REASON FOR CALL**this is important as we prioritize the call backs  YOU WILL RECEIVE A CALL BACK THE SAME DAY AS LONG AS YOU CALL BEFORE 4:00 PM

## 2021-10-25 NOTE — Progress Notes (Signed)
PCP: [name], MD  Cardiology: [name], MD HF Cardiology: Dr. [name]  86 y.o. with history of chronic systolic CHF (nonischemic cardiomyopathy), chronic LBBB, and mitral regurgitation was referred by Dr. Burt Knack for CHF evaluation prior to Mitraclip placement.   CHF was first noted in 12/20, she was admitted to the hospital in [facility] at that time.  LHC showed nonobstructive CAD.  She later followed up with Dr. Geraldo Pitter and had echo in 3/21, showing EF 35-40% with concern for severe MR.  TEE was then done in 4/21 and I reviewed it today, showing EF 30-35% with septal-lateral dyssynchrony, moderate-severe functional MR, normal RV, severe TR.  She has had a LBBB on ECGs in 2021, she did not have LBBB in 2019.    Cardiac MRI in 6/21 showed moderate LV dilation, EF 27%, moderately decreased RV function with EF 31%, no LGE, probably moderate MR.   She had St Jude CRT-P device implanted in 7/21.  Echo in 9/21 showed EF up to 40% with normal RV, mild-moderate MR.   She returns for followup of CHF.  Weight is down 4 lbs.  Large BP differential between right and left (higher on right). No pain with use of left arm.  She takes NSAIDs occasionally for aches/pains.  No significant exertional dyspnea walking on flat ground.  Mild dyspnea with stairs.  Rare atypical chest pain.  Recent K was elevated, KCl supplement was stopped.   Labs (4/21): K 4.1, creatinine 1.16 Labs (5/21): K 3.8, creatinine 1.21 => 1.1, BNP 2424 => 2201, digoxin 0.4 Labs (6/21): K 4.6, creatinine 1.17, hgb 10.9 Labs (7/21): K 4.4, creatinine 1.11, digoxin 0.8 Labs (11/21): K 4.8, creatinine 1.22 Labs (3/22): K 4.5, creatinine 1.25, LDL 96, TGs 291 Labs (5/22): K 4.6, creatinine 1.2 Labs (6/22): LDL 104, LFTs normal Labs (12/22): K 5.2, creatinine 1.31  St Jude device interrogation: >99% BiV pacing, no AF, stable impedance.   PMH: 1. LBBB: Chronic.  2. Anemia: Prior GI workup in [place] was negative.  No history of  overt GI bleeding. Fe deficiency.  3. Type 2 diabetes 4. Hyperlipidemia 5. CAD: LHC (2020) with 40% ostial left main, 50% mid RCA.  6. Chronic systolic CHF: Noted since 12/20.  Nonischemic cardiomyopathy.  - TEE (4/21): EF 30-35%, septal-lateral dyssynchrony, moderate-severe MR appears functional, normal RV size and systolic function, severe biatrial enlargement, severe TR.  - Cardiac MRI (6/21): Moderate LV dilation, EF 27%; moderately decreased RV function with EF 31%, no LGE, probably moderate MR.  - St Jude CRT-P implanted 7/21.  - Echo (9/21): EF 40%, diffuse hypokinesis, mildly decreased RV systolic function, mild-moderate MR, normal IVC.  7. GERD 8. Hyperlipidemia 9. Hypothyroidism 10. Mitral regurgitation: Suspected to be functional.  Echo in 9/21 with improved MR, appears mild-moderate.  11. Fe deficiency anemia 12. Fatty liver 13. CKD stage 3  Social History   Socioeconomic History   Marital status: Widowed    Spouse name: Not on file   Number of children: Not on file   Years of education: Not on file   Highest education level: Not on file  Occupational History   Not on file  Tobacco Use   Smoking status: Former   Smokeless tobacco: Never  Vaping Use   Vaping Use: Never used  Substance and Sexual Activity   Alcohol use: No   Drug use: No   Sexual activity: Not on file  Other Topics Concern   Not on file  Social History Narrative  Not on file   Social Determinants of Health   Financial Resource Strain: Not on file  Food Insecurity: Not on file  Transportation Needs: Not on file  Physical Activity: Not on file  Stress: Not on file  Social Connections: Not on file  Intimate Partner Violence: Not on file   Family History  Problem Relation Age of Onset   Diabetes Mother    Heart disease Mother    Hypertension Mother    Heart attack Mother    Emphysema Father    Hypertension Father    Heart attack Father    Breast cancer Sister    Diabetes Sister     Emphysema Sister    COPD Sister    Asthma Sister    Hypertension Sister    Lung cancer Brother    Diabetes Brother    Emphysema Brother    COPD Brother    Asthma Brother    Heart disease Brother    ROS: All systems reviewed and negative except as per HPI.   Current Outpatient Medications  Medication Sig Dispense Refill   albuterol (PROVENTIL) (2.5 MG/3ML) 0.083% nebulizer solution Take 2.5 mg by nebulization every 6 (six) hours as needed for wheezing or shortness of breath.      aspirin EC 81 MG tablet Take 1 tablet (81 mg total) by mouth daily. 30 tablet 11   b complex vitamins tablet Take 1 tablet by mouth daily.     Calcium Carb-Cholecalciferol (CALCIUM 600 + D PO) Take 2 tablets by mouth daily.     cetirizine (ZYRTEC) 10 MG tablet Take 10 mg by mouth daily.     cyclobenzaprine (FLEXERIL) 10 MG tablet Take 10 mg by mouth at bedtime as needed for muscle spasms.      fluticasone (FLONASE) 50 MCG/ACT nasal spray Place 2 sprays into both nostrils as needed for allergies or rhinitis.     JARDIANCE 10 MG TABS tablet TAKE 1 TABLET DAILY BEFORE BREAKFAST 90 tablet 3   levalbuterol (XOPENEX HFA) 45 MCG/ACT inhaler Inhale 2 puffs into the lungs every 4 (four) hours as needed for wheezing.     meclizine (ANTIVERT) 25 MG tablet Take 25 mg by mouth every 8 (eight) hours as needed for dizziness or nausea.      metFORMIN (GLUCOPHAGE) 500 MG tablet Take 500 mg by mouth daily.     metoprolol succinate (TOPROL-XL) 100 MG 24 hr tablet Take 1 tablet (100 mg total) by mouth every morning AND 0.5 tablets (50 mg total) every evening. 135 tablet 3   montelukast (SINGULAIR) 10 MG tablet Take 10 mg by mouth at bedtime.      mupirocin ointment (BACTROBAN) 2 % Apply 1 application topically 2 (two) times daily as needed for rash (eczema).     naproxen sodium (ALEVE) 220 MG tablet Take 440 mg by mouth daily as needed for pain (pain).     pantoprazole (PROTONIX) 40 MG tablet Take 40 mg by mouth daily.      Polyethyl Glycol-Propyl Glycol (SYSTANE OP) Place 1 drop into both eyes 4 (four) times daily.     polyethylene glycol (MIRALAX / GLYCOLAX) packet Take 17 g by mouth daily as needed for mild constipation.      rosuvastatin (CRESTOR) 20 MG tablet Take 1 tablet (20 mg total) by mouth daily. 90 tablet 3   sacubitril-valsartan (ENTRESTO) 24-26 MG Take 1 tablet by mouth 2 (two) times daily. 180 tablet 3   spironolactone (ALDACTONE) 25 MG tablet TAKE 1 TABLET  AT BEDTIME (DOSE CHANGE) 90 tablet 3   TIROSINT 75 MCG CAPS Take 1 capsule by mouth daily.     vitamin E 400 UNIT capsule Take 400 Units by mouth daily.      Wheat Dextrin (BENEFIBER) POWD Take 1 Scoop by mouth daily as needed for constipation (constipation).     furosemide (LASIX) 40 MG tablet Take 2 tablets (80 mg total) by mouth every morning. 315 tablet 3   nitroGLYCERIN (NITROSTAT) 0.4 MG SL tablet Place 1 tablet (0.4 mg total) under the tongue every 5 (five) minutes as needed for chest pain. 30 tablet 2   No current facility-administered medications for this encounter.   BP (!) 162/78    Pulse 87    Wt 71.9 kg (158 lb 9.6 oz)    SpO2 95%    BMI 29.97 kg/m   General: NAD Neck: No JVD, no thyromegaly or thyroid nodule.  Lungs: Clear to auscultation bilaterally with normal respiratory effort. CV: Nondisplaced PMI.  Heart regular S1/S2, no S3/S4, no murmur.  No peripheral edema.  No carotid bruit.  Normal pedal pulses.  Abdomen: Soft, nontender, no hepatosplenomegaly, no distention.  Skin: Intact without lesions or rashes.  Neurologic: Alert and oriented x 3.  Psych: Normal affect. Extremities: No clubbing or cyanosis. No pulse left radial, normal on right. HEENT: Normal.   Assessment/Plan: 1. Chronic systolic CHF: Patient has a nonischemic cardiomyopathy of uncertain etiology.  She has significant mitral regurgitation.  This is functional, and I do not think that it explains her cardiomyopathy (though mitral regurgitation likely worsens  symptoms).  She has a LBBB that is new since the prior ECG in 2019, cannot rule out LBBB cardiomyopathy.  Prior myocarditis is also a consideration.  TEE in 4/21 showed EF 30-35% with prominent septal-lateral dyssynchrony.  Cardiac MRI in 6/21 showed moderate LV dilation, EF 27%, moderately decreased RV function with EF 31%, no LGE, probably moderate MR.  St Jude CRT-P device implanted in 7/21.  Echo in 9/21 with EF up to 40%, improved MR (only mild-moderate). NYHA class II symptoms, she is not volume overloaded by exam or [place].  - Decrease Lasix to 80 mg daily and start Entresto 24/26 bid. BMET today and in 10 days.  - Continue spironolactone 25 mg daily.  - Continue Toprol XL 100 qam/50 qpm.   - Continue Jardiance 10 mg daily.   - Echo at followup appt.  2. CAD: Nonobstructive on 2020 cath.  No chest pain.  - Continue ASA 81  - Goal LDL < 70, Continue Crestor 20 daily.   3. Mitral regurgitation: She has functional mitral regurgitation, moderate-severe/3+ at least on TEE in 4/21.  However, after CRT, mitral regurgitation was mild-moderate on 9/21 echo.  - Repeat echo at followup.  4. Fatty liver: On Crestor.  5. CKD: Stage 3.   - Avoid NSAIDs.   6. Subclavian stenosis: Suspected on left with significant pressure differential between the arms.  - I will arrange for carotid dopplers to also check the subclavians.   Followup in 3 months with echo.   Loralie Champagne 10/25/2021

## 2021-10-30 DIAGNOSIS — Z79899 Other long term (current) drug therapy: Secondary | ICD-10-CM | POA: Diagnosis not present

## 2021-10-30 DIAGNOSIS — Z6825 Body mass index (BMI) 25.0-25.9, adult: Secondary | ICD-10-CM | POA: Diagnosis not present

## 2021-10-30 DIAGNOSIS — U071 COVID-19: Secondary | ICD-10-CM | POA: Diagnosis not present

## 2021-11-04 ENCOUNTER — Telehealth (HOSPITAL_COMMUNITY): Payer: Self-pay | Admitting: *Deleted

## 2021-11-04 MED ORDER — VALSARTAN 40 MG PO TABS: 20.0000 mg | ORAL_TABLET | Freq: Two times a day (BID) | ORAL | 3 refills | Status: DC

## 2021-11-04 MED ORDER — FUROSEMIDE 40 MG PO TABS: ORAL_TABLET | ORAL | 3 refills | Status: DC

## 2021-11-04 NOTE — Telephone Encounter (Signed)
If she cannot take entresto, stop it and start valsartan 20 mg bid instead.  She can resume prior Lasix dose, 80 qam/40 qpm.  BMET in 1 week.

## 2021-11-04 NOTE — Telephone Encounter (Signed)
Pt left vm stating she cant take entresto b/c it caused a bad rash and terrible itching.    Routed to Dr.Dalton [facility]

## 2021-11-04 NOTE — Telephone Encounter (Signed)
Pt aware and agreeable with plan.  

## 2021-11-04 NOTE — Progress Notes (Signed)
Remote pacemaker transmission.   

## 2021-11-10 DIAGNOSIS — J310 Chronic rhinitis: Secondary | ICD-10-CM | POA: Diagnosis not present

## 2021-11-10 DIAGNOSIS — Z8616 Personal history of COVID-19: Secondary | ICD-10-CM | POA: Diagnosis not present

## 2021-11-10 DIAGNOSIS — R5383 Other fatigue: Secondary | ICD-10-CM | POA: Diagnosis not present

## 2021-11-10 DIAGNOSIS — J4520 Mild intermittent asthma, uncomplicated: Secondary | ICD-10-CM | POA: Diagnosis not present

## 2021-11-10 DIAGNOSIS — R042 Hemoptysis: Secondary | ICD-10-CM | POA: Diagnosis not present

## 2021-11-10 DIAGNOSIS — G4733 Obstructive sleep apnea (adult) (pediatric): Secondary | ICD-10-CM | POA: Diagnosis not present

## 2021-11-24 ENCOUNTER — Other Ambulatory Visit: Payer: Self-pay

## 2021-11-24 ENCOUNTER — Ambulatory Visit (INDEPENDENT_AMBULATORY_CARE_PROVIDER_SITE_OTHER): Payer: Medicare Other

## 2021-11-24 DIAGNOSIS — I771 Stricture of artery: Secondary | ICD-10-CM

## 2021-11-24 DIAGNOSIS — I5020 Unspecified systolic (congestive) heart failure: Secondary | ICD-10-CM | POA: Diagnosis not present

## 2021-11-25 LAB — BASIC METABOLIC PANEL
BUN/Creatinine Ratio: 30 — ABNORMAL HIGH (ref 12–28)
BUN: 32 mg/dL — ABNORMAL HIGH (ref 8–27)
CO2: 24 mmol/L (ref 20–29)
Calcium: 9.2 mg/dL (ref 8.7–10.3)
Chloride: 97 mmol/L (ref 96–106)
Creatinine, Ser: 1.08 mg/dL — ABNORMAL HIGH (ref 0.57–1.00)
Glucose: 97 mg/dL (ref 70–99)
Potassium: 4.1 mmol/L (ref 3.5–5.2)
Sodium: 135 mmol/L (ref 134–144)
eGFR: 51 mL/min/{1.73_m2} — ABNORMAL LOW (ref >59)

## 2021-12-03 ENCOUNTER — Other Ambulatory Visit: Payer: Self-pay

## 2021-12-18 DIAGNOSIS — R042 Hemoptysis: Secondary | ICD-10-CM | POA: Diagnosis not present

## 2021-12-18 DIAGNOSIS — R5383 Other fatigue: Secondary | ICD-10-CM | POA: Diagnosis not present

## 2021-12-18 DIAGNOSIS — J310 Chronic rhinitis: Secondary | ICD-10-CM | POA: Diagnosis not present

## 2021-12-18 DIAGNOSIS — J4520 Mild intermittent asthma, uncomplicated: Secondary | ICD-10-CM | POA: Diagnosis not present

## 2021-12-18 DIAGNOSIS — Z8616 Personal history of COVID-19: Secondary | ICD-10-CM | POA: Diagnosis not present

## 2021-12-18 DIAGNOSIS — G4733 Obstructive sleep apnea (adult) (pediatric): Secondary | ICD-10-CM | POA: Diagnosis not present

## 2021-12-29 ENCOUNTER — Telehealth (HOSPITAL_COMMUNITY): Payer: Self-pay

## 2021-12-29 NOTE — Telephone Encounter (Signed)
Stop valsartan immediately.  Agree with Benadryl, to ER if she has any trouble breathing.  Would get appointment with APP to readdress medications.  ?

## 2021-12-29 NOTE — Telephone Encounter (Signed)
Spoke with pt she is aware and agreeable with plan. Pt already has an appt scheduled.  ?

## 2021-12-29 NOTE — Telephone Encounter (Signed)
Patient called to report that she  has been experiencing an allergic reaction to the Valsartan, that has gradually gotten worse over time. She reports that lips are swollen, mouth feels rough and is causing a burning sensation in her mouth. She reports that her last dose was Sunday 12/27/21. I advised patient to not take any today and to start benadryl and to call EMS if it starts to feel like her throat is calling. Please advise on what other steps need to be taken.  ?

## 2022-01-12 DIAGNOSIS — E119 Type 2 diabetes mellitus without complications: Secondary | ICD-10-CM | POA: Diagnosis not present

## 2022-01-12 DIAGNOSIS — J45909 Unspecified asthma, uncomplicated: Secondary | ICD-10-CM | POA: Diagnosis not present

## 2022-01-12 DIAGNOSIS — Z8679 Personal history of other diseases of the circulatory system: Secondary | ICD-10-CM | POA: Diagnosis not present

## 2022-01-12 DIAGNOSIS — R413 Other amnesia: Secondary | ICD-10-CM | POA: Diagnosis not present

## 2022-01-12 DIAGNOSIS — Z79899 Other long term (current) drug therapy: Secondary | ICD-10-CM | POA: Diagnosis not present

## 2022-01-12 DIAGNOSIS — E785 Hyperlipidemia, unspecified: Secondary | ICD-10-CM | POA: Diagnosis not present

## 2022-01-12 DIAGNOSIS — D509 Iron deficiency anemia, unspecified: Secondary | ICD-10-CM | POA: Diagnosis not present

## 2022-01-12 DIAGNOSIS — J309 Allergic rhinitis, unspecified: Secondary | ICD-10-CM | POA: Diagnosis not present

## 2022-01-12 DIAGNOSIS — Z6829 Body mass index (BMI) 29.0-29.9, adult: Secondary | ICD-10-CM | POA: Diagnosis not present

## 2022-01-12 DIAGNOSIS — I10 Essential (primary) hypertension: Secondary | ICD-10-CM | POA: Diagnosis not present

## 2022-01-12 DIAGNOSIS — E559 Vitamin D deficiency, unspecified: Secondary | ICD-10-CM | POA: Diagnosis not present

## 2022-01-12 DIAGNOSIS — E039 Hypothyroidism, unspecified: Secondary | ICD-10-CM | POA: Diagnosis not present

## 2022-01-12 DIAGNOSIS — E1140 Type 2 diabetes mellitus with diabetic neuropathy, unspecified: Secondary | ICD-10-CM | POA: Diagnosis not present

## 2022-01-13 DIAGNOSIS — E785 Hyperlipidemia, unspecified: Secondary | ICD-10-CM | POA: Diagnosis not present

## 2022-01-13 DIAGNOSIS — Z0000 Encounter for general adult medical examination without abnormal findings: Secondary | ICD-10-CM | POA: Diagnosis not present

## 2022-01-13 DIAGNOSIS — Z9181 History of falling: Secondary | ICD-10-CM | POA: Diagnosis not present

## 2022-01-13 DIAGNOSIS — Z1331 Encounter for screening for depression: Secondary | ICD-10-CM | POA: Diagnosis not present

## 2022-01-13 DIAGNOSIS — Z139 Encounter for screening, unspecified: Secondary | ICD-10-CM | POA: Diagnosis not present

## 2022-01-19 ENCOUNTER — Encounter (HOSPITAL_COMMUNITY): Payer: Self-pay | Admitting: Cardiology

## 2022-01-19 ENCOUNTER — Ambulatory Visit (HOSPITAL_BASED_OUTPATIENT_CLINIC_OR_DEPARTMENT_OTHER)
Admission: RE | Admit: 2022-01-19 | Discharge: 2022-01-19 | Disposition: A | Discharge status: Home / Self Care | Payer: Medicare Other | Source: Ambulatory Visit | Attending: Cardiology | Admitting: Cardiology

## 2022-01-19 ENCOUNTER — Other Ambulatory Visit: Payer: Self-pay

## 2022-01-19 ENCOUNTER — Ambulatory Visit (HOSPITAL_COMMUNITY)
Admission: RE | Admit: 2022-01-19 | Discharge: 2022-01-19 | Disposition: A | Discharge status: Home / Self Care | Payer: Medicare Other | Source: Ambulatory Visit | Attending: Cardiology | Admitting: Cardiology

## 2022-01-19 VITALS — BP 150/80 | HR 80 | Wt 157.0 lb

## 2022-01-19 DIAGNOSIS — I2510 Atherosclerotic heart disease of native coronary artery without angina pectoris: Secondary | ICD-10-CM | POA: Diagnosis not present

## 2022-01-19 DIAGNOSIS — E782 Mixed hyperlipidemia: Secondary | ICD-10-CM

## 2022-01-19 DIAGNOSIS — I5020 Unspecified systolic (congestive) heart failure: Secondary | ICD-10-CM

## 2022-01-19 DIAGNOSIS — N183 Chronic kidney disease, stage 3 unspecified: Secondary | ICD-10-CM | POA: Diagnosis not present

## 2022-01-19 DIAGNOSIS — K219 Gastro-esophageal reflux disease without esophagitis: Secondary | ICD-10-CM | POA: Diagnosis not present

## 2022-01-19 DIAGNOSIS — I5022 Chronic systolic (congestive) heart failure: Secondary | ICD-10-CM | POA: Diagnosis not present

## 2022-01-19 DIAGNOSIS — K760 Fatty (change of) liver, not elsewhere classified: Secondary | ICD-10-CM | POA: Insufficient documentation

## 2022-01-19 DIAGNOSIS — I340 Nonrheumatic mitral (valve) insufficiency: Secondary | ICD-10-CM | POA: Insufficient documentation

## 2022-01-19 DIAGNOSIS — Z7982 Long term (current) use of aspirin: Secondary | ICD-10-CM | POA: Insufficient documentation

## 2022-01-19 DIAGNOSIS — T783XXA Angioneurotic edema, initial encounter: Secondary | ICD-10-CM | POA: Insufficient documentation

## 2022-01-19 DIAGNOSIS — E1122 Type 2 diabetes mellitus with diabetic chronic kidney disease: Secondary | ICD-10-CM | POA: Insufficient documentation

## 2022-01-19 DIAGNOSIS — I700 Atherosclerosis of aorta: Secondary | ICD-10-CM | POA: Insufficient documentation

## 2022-01-19 DIAGNOSIS — I428 Other cardiomyopathies: Secondary | ICD-10-CM | POA: Diagnosis not present

## 2022-01-19 DIAGNOSIS — I447 Left bundle-branch block, unspecified: Secondary | ICD-10-CM | POA: Diagnosis not present

## 2022-01-19 DIAGNOSIS — I130 Hypertensive heart and chronic kidney disease with heart failure and stage 1 through stage 4 chronic kidney disease, or unspecified chronic kidney disease: Secondary | ICD-10-CM | POA: Diagnosis not present

## 2022-01-19 DIAGNOSIS — G4730 Sleep apnea, unspecified: Secondary | ICD-10-CM | POA: Insufficient documentation

## 2022-01-19 DIAGNOSIS — Z7984 Long term (current) use of oral hypoglycemic drugs: Secondary | ICD-10-CM | POA: Insufficient documentation

## 2022-01-19 DIAGNOSIS — Z8249 Family history of ischemic heart disease and other diseases of the circulatory system: Secondary | ICD-10-CM | POA: Diagnosis not present

## 2022-01-19 DIAGNOSIS — Z833 Family history of diabetes mellitus: Secondary | ICD-10-CM | POA: Insufficient documentation

## 2022-01-19 DIAGNOSIS — X58XXXA Exposure to other specified factors, initial encounter: Secondary | ICD-10-CM | POA: Diagnosis not present

## 2022-01-19 DIAGNOSIS — Z79899 Other long term (current) drug therapy: Secondary | ICD-10-CM | POA: Insufficient documentation

## 2022-01-19 LAB — LIPID PANEL
Cholesterol: 126 mg/dL (ref 0–200)
HDL: 36 mg/dL — ABNORMAL LOW (ref >40)
LDL Cholesterol: 35 mg/dL (ref 0–99)
Total CHOL/HDL Ratio: 3.5 RATIO
Triglycerides: 276 mg/dL — ABNORMAL HIGH (ref <150)
VLDL: 55 mg/dL — ABNORMAL HIGH (ref 0–40)

## 2022-01-19 LAB — BASIC METABOLIC PANEL
Anion gap: 12 (ref 5–15)
BUN: 23 mg/dL (ref 8–23)
CO2: 25 mmol/L (ref 22–32)
Calcium: 9.6 mg/dL (ref 8.9–10.3)
Chloride: 100 mmol/L (ref 98–111)
Creatinine, Ser: 1.31 mg/dL — ABNORMAL HIGH (ref 0.44–1.00)
GFR, Estimated: 40 mL/min — ABNORMAL LOW (ref >60)
Glucose, Bld: 123 mg/dL — ABNORMAL HIGH (ref 70–99)
Potassium: 3.5 mmol/L (ref 3.5–5.1)
Sodium: 137 mmol/L (ref 135–145)

## 2022-01-19 LAB — ECHOCARDIOGRAM COMPLETE
Calc EF: 56.3 %
S' Lateral: 4.4 cm
Single Plane A2C EF: 63 %
Single Plane A4C EF: 48.9 %

## 2022-01-19 LAB — BRAIN NATRIURETIC PEPTIDE: B Natriuretic Peptide: 54.2 pg/mL (ref 0.0–100.0)

## 2022-01-19 MED ORDER — METOPROLOL SUCCINATE ER 100 MG PO TB24: 100.0000 mg | ORAL_TABLET | Freq: Two times a day (BID) | ORAL | 3 refills | Status: DC

## 2022-01-19 NOTE — Patient Instructions (Signed)
Medication Changes: ? ?Increase Toprol XL to '100mg'$  Twice daily ? ? ?Lab Work: ? ?Labs done today, your results will be available in MyChart, we will contact you for abnormal readings. ? ? ?Testing/Procedures: ? ?none ? ?Referrals: ? ?none ? ?Special Instructions // Education: ? ?none ? ?Follow-Up in: 4 months  ? ?At the [facility], you and your health needs are our priority. We have a designated team specialized in the treatment of Heart Failure. This Care Team includes your primary Heart Failure Specialized Cardiologist (physician), Advanced Practice Providers (APPs- Physician Assistants and Nurse Practitioners), and Pharmacist who all work together to provide you with the care you need, when you need it.  ? ?You may see any of the following providers on your designated Care Team at your next follow up: ? ?Dr Glori Bickers ?Dr Loralie Champagne ?Darrick Grinder, NP ?Lyda Jester, PA ?Jessica Milford,NP ?Marlyce Huge, PA ?Audry Riles, PharmD ? ? ?Please be sure to bring in all your medications bottles to every appointment.  ? ?Need to Contact us: ? ?If you have any questions or concerns before your next appointment please send [place] a message through [employer] or call our office at [phone].   ? ?TO LEAVE A MESSAGE FOR THE NURSE SELECT OPTION 2, PLEASE LEAVE A MESSAGE INCLUDING: ?YOUR NAME ?DATE OF BIRTH ?CALL BACK NUMBER ?REASON FOR CALL**this is important as we prioritize the call backs ? ?YOU WILL RECEIVE A CALL BACK THE SAME DAY AS LONG AS YOU CALL BEFORE 4:00 PM ? ? ?

## 2022-01-20 LAB — CUP PACEART REMOTE DEVICE CHECK
Battery Remaining Longevity: 70 mo
Battery Remaining Percentage: 78 %
Battery Voltage: 2.99 V
Brady Statistic AP VP Percent: 1 %
Brady Statistic AP VS Percent: 1 %
Brady Statistic AS VP Percent: 99 %
Brady Statistic AS VS Percent: 1 %
Brady Statistic RA Percent Paced: 1 %
Date Time Interrogation Session: 20230328151951
Implantable Lead Implant Date: 20210701
Implantable Lead Implant Date: 20210701
Implantable Lead Implant Date: 20210701
Implantable Lead Location: 753858
Implantable Lead Location: 753859
Implantable Lead Location: 753860
Implantable Pulse Generator Implant Date: 20210701
Lead Channel Impedance Value: 440 Ohm
Lead Channel Impedance Value: 540 Ohm
Lead Channel Impedance Value: 910 Ohm
Lead Channel Pacing Threshold Amplitude: 0.75 V
Lead Channel Pacing Threshold Amplitude: 0.75 V
Lead Channel Pacing Threshold Amplitude: 0.75 V
Lead Channel Pacing Threshold Pulse Width: 0.5 ms
Lead Channel Pacing Threshold Pulse Width: 0.5 ms
Lead Channel Pacing Threshold Pulse Width: 0.5 ms
Lead Channel Sensing Intrinsic Amplitude: 12 mV
Lead Channel Sensing Intrinsic Amplitude: 3.9 mV
Lead Channel Setting Pacing Amplitude: 2 V
Lead Channel Setting Pacing Amplitude: 2.5 V
Lead Channel Setting Pacing Amplitude: 2.5 V
Lead Channel Setting Pacing Pulse Width: 0.5 ms
Lead Channel Setting Pacing Pulse Width: 0.5 ms
Lead Channel Setting Sensing Sensitivity: 2 mV
Pulse Gen Model: 3562
Pulse Gen Serial Number: 3818336

## 2022-01-20 NOTE — Progress Notes (Signed)
PCP: [name], MD  ?Cardiology: [name], MD ?HF Cardiology: Dr. [name] ? ?86 y.o. with history of chronic systolic CHF (nonischemic cardiomyopathy), chronic LBBB, and mitral regurgitation was referred by Dr. Burt Knack for CHF evaluation prior to Mitraclip placement.  ? ?CHF was first noted in 12/20, she was admitted to the hospital in [facility] at that time.  LHC showed nonobstructive CAD.  She later followed up with Dr. Geraldo Pitter and had echo in 3/21, showing EF 35-40% with concern for severe MR.  TEE was then done in 4/21 and I reviewed it today, showing EF 30-35% with septal-lateral dyssynchrony, moderate-severe functional MR, normal RV, severe TR.  She has had a LBBB on ECGs in 2021, she did not have LBBB in 2019.   ? ?Cardiac MRI in 6/21 showed moderate LV dilation, EF 27%, moderately decreased RV function with EF 31%, no LGE, probably moderate MR.  ? ?She had St Jude CRT-P device implanted in 7/21.  Echo in 9/21 showed EF up to 40% with normal RV, mild-moderate MR. Echo was done today and reviewed, EF 40-45%, mild LVH, mildly decreased RV systolic function, moderate MR.  ? ?She returns for followup of CHF.  Weight down 1 lb. She was unable to tolerate Entresto or valsartan due to angioedema.  She has mild dyspnea carrying groceries but generally no dyspnea walking on flat ground.  No chest pain. No orthopnea/PND.  No lightheadedness.  ? ?Labs (4/21): K 4.1, creatinine 1.16 ?Labs (5/21): K 3.8, creatinine 1.21 => 1.1, BNP 2424 => 2201, digoxin 0.4 ?Labs (6/21): K 4.6, creatinine 1.17, hgb 10.9 ?Labs (7/21): K 4.4, creatinine 1.11, digoxin 0.8 ?Labs (11/21): K 4.8, creatinine 1.22 ?Labs (3/22): K 4.5, creatinine 1.25, LDL 96, TGs 291 ?Labs (5/22): K 4.6, creatinine 1.2 ?Labs (6/22): LDL 104, LFTs normal ?Labs (12/22): K 5.2, creatinine 1.31 ?Labs (1/23): K 4.1, creatinine 1.08 ? ?St Jude device interrogation: >99% BiV pacing, no AF, stable thoracic impedance.  ? ?PMH: ?1. LBBB: Chronic.  ?2. Anemia:  Prior GI workup in [place] was negative.  No history of overt GI bleeding. Fe deficiency.  ?3. Type 2 diabetes ?4. Hyperlipidemia ?5. CAD: LHC (2020) with 40% ostial left main, 50% mid RCA.  ?6. Chronic systolic CHF: Noted since 12/20.  Nonischemic cardiomyopathy.  ?- TEE (4/21): EF 30-35%, septal-lateral dyssynchrony, moderate-severe MR appears functional, normal RV size and systolic function, severe biatrial enlargement, severe TR.  ?- Cardiac MRI (6/21): Moderate LV dilation, EF 27%; moderately decreased RV function with EF 31%, no LGE, probably moderate MR.  ?- St Jude CRT-P implanted 7/21.  ?- Echo (9/21): EF 40%, diffuse hypokinesis, mildly decreased RV systolic function, mild-moderate MR, normal IVC.  ?- Echo (3/23): EF 40-45%, mild LVH, mildly decreased RV systolic function, moderate MR. ?7. GERD ?8. Hyperlipidemia ?9. Hypothyroidism ?10. Mitral regurgitation: Suspected to be functional.  Echo in 9/21 with improved MR, appears mild-moderate.  ?11. Fe deficiency anemia ?12. Fatty liver ?13. CKD stage 3 ? ?Social History  ? ?Socioeconomic History  ? Marital status: Widowed  ?  Spouse name: Not on file  ? Number of children: Not on file  ? Years of education: Not on file  ? Highest education level: Not on file  ?Occupational History  ? Not on file  ?Tobacco Use  ? Smoking status: Former  ? Smokeless tobacco: Never  ?Vaping Use  ? Vaping Use: Never used  ?Substance and Sexual Activity  ? Alcohol use: No  ? Drug use: No  ?  Sexual activity: Not on file  ?Other Topics Concern  ? Not on file  ?Social History Narrative  ? Not on file  ? ?Social Determinants of Health  ? ?Financial Resource Strain: Not on file  ?Food Insecurity: Not on file  ?Transportation Needs: Not on file  ?Physical Activity: Not on file  ?Stress: Not on file  ?Social Connections: Not on file  ?Intimate Partner Violence: Not on file  ? ?Family History  ?Problem Relation Age of Onset  ? Diabetes Mother   ? Heart disease Mother   ? Hypertension  Mother   ? Heart attack Mother   ? Emphysema Father   ? Hypertension Father   ? Heart attack Father   ? Breast cancer Sister   ? Diabetes Sister   ? Emphysema Sister   ? COPD Sister   ? Asthma Sister   ? Hypertension Sister   ? Lung cancer Brother   ? Diabetes Brother   ? Emphysema Brother   ? COPD Brother   ? Asthma Brother   ? Heart disease Brother   ? ?ROS: All systems reviewed and negative except as per HPI.  ? ?Current Outpatient Medications  ?Medication Sig Dispense Refill  ? albuterol (PROVENTIL) (2.5 MG/3ML) 0.083% nebulizer solution Take 2.5 mg by nebulization every 6 (six) hours as needed for wheezing or shortness of breath.     ? aspirin EC 81 MG tablet Take 1 tablet (81 mg total) by mouth daily. 30 tablet 11  ? b complex vitamins tablet Take 1 tablet by mouth daily.    ? Calcium Carb-Cholecalciferol (CALCIUM 600 + D PO) Take 2 tablets by mouth daily.    ? cetirizine (ZYRTEC) 10 MG tablet Take 10 mg by mouth daily.    ? cyclobenzaprine (FLEXERIL) 10 MG tablet Take 10 mg by mouth at bedtime as needed for muscle spasms.     ? fluticasone (FLONASE) 50 MCG/ACT nasal spray Place 2 sprays into both nostrils as needed for allergies or rhinitis.    ? furosemide (LASIX) 40 MG tablet Take 2 tablets (80 mg total) by mouth every morning AND 1 tablet (40 mg total) every evening. 315 tablet 3  ? JARDIANCE 10 MG TABS tablet TAKE 1 TABLET DAILY BEFORE BREAKFAST 90 tablet 3  ? levalbuterol (XOPENEX HFA) 45 MCG/ACT inhaler Inhale 2 puffs into the lungs every 4 (four) hours as needed for wheezing.    ? meclizine (ANTIVERT) 25 MG tablet Take 25 mg by mouth every 8 (eight) hours as needed for dizziness or nausea.     ? metFORMIN (GLUCOPHAGE) 500 MG tablet Take 250 mg by mouth 2 (two) times daily. 1/2 tablet bid    ? montelukast (SINGULAIR) 10 MG tablet Take 10 mg by mouth at bedtime.     ? mupirocin ointment (BACTROBAN) 2 % Apply 1 application topically 2 (two) times daily as needed for rash (eczema).    ? naproxen sodium  (ALEVE) 220 MG tablet Take 440 mg by mouth daily as needed for pain (pain).    ? nitroGLYCERIN (NITROSTAT) 0.4 MG SL tablet Place 1 tablet (0.4 mg total) under the tongue every 5 (five) minutes as needed for chest pain. 30 tablet 2  ? pantoprazole (PROTONIX) 40 MG tablet Take 40 mg by mouth daily.    ? Polyethyl Glycol-Propyl Glycol (SYSTANE OP) Place 1 drop into both eyes 4 (four) times daily.    ? polyethylene glycol (MIRALAX / GLYCOLAX) packet Take 17 g by mouth daily as needed for  mild constipation.     ? rosuvastatin (CRESTOR) 20 MG tablet Take 1 tablet (20 mg total) by mouth daily. 90 tablet 3  ? spironolactone (ALDACTONE) 25 MG tablet TAKE 1 TABLET AT BEDTIME (DOSE CHANGE) 90 tablet 3  ? TIROSINT 75 MCG CAPS Take 1 capsule by mouth daily.    ? vitamin E 400 UNIT capsule Take 400 Units by mouth daily.     ? Wheat Dextrin (BENEFIBER) POWD Take 1 Scoop by mouth daily as needed for constipation (constipation).    ? metoprolol succinate (TOPROL-XL) 100 MG 24 hr tablet Take 1 tablet (100 mg total) by mouth 2 (two) times daily. 135 tablet 3  ? ?No current facility-administered medications for this encounter.  ? ?BP (!) 150/80   Pulse 80   Wt 71.2 kg (157 lb)   SpO2 95%   BMI 29.66 kg/m?   ?General: NAD ?Neck: No JVD, no thyromegaly or thyroid nodule.  ?Lungs: Clear to auscultation bilaterally with normal respiratory effort. ?CV: Nondisplaced PMI.  Heart regular S1/S2, no S3/S4, no murmur.  No peripheral edema.  No carotid bruit.  Normal pedal pulses.  ?Abdomen: Soft, nontender, no hepatosplenomegaly, no distention.  ?Skin: Intact without lesions or rashes.  ?Neurologic: Alert and oriented x 3.  ?Psych: Normal affect. ?Extremities: No clubbing or cyanosis.  ?HEENT: Normal.  ? ?Assessment/Plan: ?1. Chronic systolic CHF: Patient has a nonischemic cardiomyopathy of uncertain etiology.  She has significant mitral regurgitation.  This is functional, and I do not think that it explains her cardiomyopathy (though mitral  regurgitation likely worsens symptoms).  She has a LBBB that is new since the prior ECG in 2019, cannot rule out LBBB cardiomyopathy.  Prior myocarditis is also a consideration.  TEE in 4/21 showed EF 30-35% with

## 2022-01-21 ENCOUNTER — Ambulatory Visit (INDEPENDENT_AMBULATORY_CARE_PROVIDER_SITE_OTHER): Payer: Medicare Other

## 2022-01-21 DIAGNOSIS — I428 Other cardiomyopathies: Secondary | ICD-10-CM | POA: Diagnosis not present

## 2022-01-25 DIAGNOSIS — H524 Presbyopia: Secondary | ICD-10-CM | POA: Diagnosis not present

## 2022-01-25 DIAGNOSIS — H35371 Puckering of macula, right eye: Secondary | ICD-10-CM | POA: Diagnosis not present

## 2022-01-25 DIAGNOSIS — E119 Type 2 diabetes mellitus without complications: Secondary | ICD-10-CM | POA: Diagnosis not present

## 2022-02-02 NOTE — Progress Notes (Signed)
Remote pacemaker transmission.   

## 2022-03-08 DIAGNOSIS — L578 Other skin changes due to chronic exposure to nonionizing radiation: Secondary | ICD-10-CM | POA: Diagnosis not present

## 2022-03-08 DIAGNOSIS — L570 Actinic keratosis: Secondary | ICD-10-CM | POA: Diagnosis not present

## 2022-03-08 DIAGNOSIS — L821 Other seborrheic keratosis: Secondary | ICD-10-CM | POA: Diagnosis not present

## 2022-03-12 DIAGNOSIS — Z1231 Encounter for screening mammogram for malignant neoplasm of breast: Secondary | ICD-10-CM | POA: Diagnosis not present

## 2022-03-15 DIAGNOSIS — E119 Type 2 diabetes mellitus without complications: Secondary | ICD-10-CM | POA: Diagnosis not present

## 2022-03-15 DIAGNOSIS — R1310 Dysphagia, unspecified: Secondary | ICD-10-CM | POA: Diagnosis not present

## 2022-03-15 DIAGNOSIS — E039 Hypothyroidism, unspecified: Secondary | ICD-10-CM | POA: Diagnosis not present

## 2022-03-15 DIAGNOSIS — H9190 Unspecified hearing loss, unspecified ear: Secondary | ICD-10-CM | POA: Diagnosis not present

## 2022-03-15 DIAGNOSIS — Z8673 Personal history of transient ischemic attack (TIA), and cerebral infarction without residual deficits: Secondary | ICD-10-CM | POA: Diagnosis not present

## 2022-03-15 DIAGNOSIS — E1140 Type 2 diabetes mellitus with diabetic neuropathy, unspecified: Secondary | ICD-10-CM | POA: Diagnosis not present

## 2022-03-15 DIAGNOSIS — K219 Gastro-esophageal reflux disease without esophagitis: Secondary | ICD-10-CM | POA: Diagnosis not present

## 2022-03-15 DIAGNOSIS — I10 Essential (primary) hypertension: Secondary | ICD-10-CM | POA: Diagnosis not present

## 2022-03-16 DIAGNOSIS — G4733 Obstructive sleep apnea (adult) (pediatric): Secondary | ICD-10-CM | POA: Diagnosis not present

## 2022-03-16 DIAGNOSIS — J4520 Mild intermittent asthma, uncomplicated: Secondary | ICD-10-CM | POA: Diagnosis not present

## 2022-03-16 DIAGNOSIS — R042 Hemoptysis: Secondary | ICD-10-CM | POA: Diagnosis not present

## 2022-03-16 DIAGNOSIS — R5383 Other fatigue: Secondary | ICD-10-CM | POA: Diagnosis not present

## 2022-03-16 DIAGNOSIS — J310 Chronic rhinitis: Secondary | ICD-10-CM | POA: Diagnosis not present

## 2022-03-16 DIAGNOSIS — Z8616 Personal history of COVID-19: Secondary | ICD-10-CM | POA: Diagnosis not present

## 2022-04-01 ENCOUNTER — Encounter: Payer: Self-pay | Admitting: Cardiology

## 2022-04-01 ENCOUNTER — Ambulatory Visit (INDEPENDENT_AMBULATORY_CARE_PROVIDER_SITE_OTHER): Payer: Medicare Other | Admitting: Cardiology

## 2022-04-01 VITALS — BP 150/60 | HR 80 | Ht 61.0 in | Wt 153.0 lb

## 2022-04-01 DIAGNOSIS — E088 Diabetes mellitus due to underlying condition with unspecified complications: Secondary | ICD-10-CM | POA: Diagnosis not present

## 2022-04-01 DIAGNOSIS — I10 Essential (primary) hypertension: Secondary | ICD-10-CM | POA: Diagnosis not present

## 2022-04-01 DIAGNOSIS — I447 Left bundle-branch block, unspecified: Secondary | ICD-10-CM | POA: Diagnosis not present

## 2022-04-01 DIAGNOSIS — I5020 Unspecified systolic (congestive) heart failure: Secondary | ICD-10-CM | POA: Diagnosis not present

## 2022-04-01 DIAGNOSIS — I2510 Atherosclerotic heart disease of native coronary artery without angina pectoris: Secondary | ICD-10-CM | POA: Diagnosis not present

## 2022-04-01 NOTE — Progress Notes (Signed)
Cardiology Office Note:    Date:  04/01/2022   ID:  Vanessa Johnson, DOB 03-29-1936, MRN [id number]  PCP:  Vanessa Johns, MD  Cardiologist:  Vanessa Lindau, MD   Referring MD: Vanessa Johns, MD    ASSESSMENT:    1. Coronary artery disease involving native coronary artery of native heart without angina pectoris   2. Essential hypertension   3. Left bundle branch block   4. Diabetes mellitus due to underlying condition with complication, without long-term current use of insulin ([place])   5. Systolic congestive heart failure, unspecified HF chronicity (HCC)    PLAN:    In order of problems listed above:  Coronary artery disease: Secondary prevention stressed with the patient.  Importance of compliance with diet and medication stressed and vocalized understanding.  She is very active and uses a bicycle like pedaling to keep her self active. Essential hypertension: Blood pressure stable and diet was emphasized.  Lifestyle modification urged.  She has an element of whitecoat hypertension.  Blood pressures at home are fine. Mixed dyslipidemia: I reviewed all blood work from primary care and discussed with her at length.  Diet emphasized.  Triglycerides are elevated.  Reduction in carbs and fried fatty foods encouraged. Diabetes mellitus: Managed by primary care. Patient will be seen in follow-up appointment in 9 months or earlier if the patient has any concerns    Medication Adjustments/Labs and Tests Ordered: Current medicines are reviewed at length with the patient today.  Concerns regarding medicines are outlined above.  No orders of the defined types were placed in this encounter.  No orders of the defined types were placed in this encounter.    Chief Complaint  Patient presents with   Follow-up     History of Present Illness:    Vanessa Johnson is a 86 y.o. female.  Patient has past medical history of coronary artery disease, essential hypertension, dyslipidemia, diabetes  mellitus.  She denies any problems at this time and takes care of activities of daily living.  No chest pain orthopnea or PND.  She has history of congestive heart failure but is doing well enough and well compensated.  She is very watchful about her diet and daily weights.  At the time of my evaluation, the patient is alert awake oriented and in no distress.  Past Medical History:  Diagnosis Date   Allergic rhinitis    Back pain    left lower back   Benign essential HTN    Biventricular cardiac pacemaker in situ 08/05/2020   BMI 34.0-34.9,adult    Bronchial asthma    Bronchitis 11/20/2018   CAD (coronary artery disease)    Heart cath 01/2017 [facility]   Cardiology follow-up encounter 01/26/2017   Overview:  Added automatically from request for surgery [id number]  Formatting of this note might be different from the original. Added automatically from request for surgery [id number]   CHF (congestive heart failure) ([place])    Cholelithiasis with cholecystitis without obstruction 12/04/2015   Diabetes mellitus ([place])    Diabetes mellitus due to underlying condition with complication, without long-term current use of insulin ([place]) 01/15/2016   Formatting of this note might be different from the original. Added automatically from request for surgery [id number]   Diabetes mellitus without complication ([place]) 0/07/9322   Dysfunction of right eustachian tube 11/20/2018   Essential hypertension 05/17/2017   Gastritis    Dr. Orlena Sheldon- EGD 2018   GERD (gastroesophageal reflux disease)    Hyperlipidemia  Hypothyroid    Iron deficiency anemia 09/02/2020   Ischiogluteal bursitis of left side    Left bundle branch block 04/02/2020   Mixed dyslipidemia 05/17/2017   Nonischemic cardiomyopathy ([place]) 04/02/2020   Obstructive sleep apnea of adult 12/04/2015   Pre-ulcerative corn or callous    Sleep apnea    On CPAP   Tinea pedis of right foot 01/15/2016   Unsteadiness 08/16/2018   Last Assessment & Plan:  Emergency department  follow-up for evaluation of vertigo. Acute onset of vertigo 08/12/2018.  CT head at that time was okay.  Vertiginous symptoms resolved that day.  She persists with some unsteadiness but it is generally improving.  She has chronic history of unsteady that generally is helped by meclizine.  Denies any hearing loss symptoms or ringing in the ears. EXAM sh   Vitamin D deficiency     Past Surgical History:  Procedure Laterality Date   BIV PACEMAKER INSERTION CRT-P N/A 04/24/2020   Procedure: BIV PACEMAKER INSERTION CRT-P;  Surgeon: Evans Lance, MD;  Location: [facility];  Service: Cardiovascular;  Laterality: N/A;   BREAST BIOPSY  1970, 1980, and 2000   CATARACT EXTRACTION, BILATERAL  2016   CHOLECYSTECTOMY  2017   DILATION AND CURETTAGE OF UTERUS  1996   HEMORRHOIDECTOMY WITH HEMORRHOID BANDING  1970   LEFT HEART CATH AND CORONARY ANGIOGRAPHY  01/2017   [facility]- No intervention   TEE WITHOUT CARDIOVERSION N/A 02/21/2020   Procedure: TRANSESOPHAGEAL ECHOCARDIOGRAM (TEE);  Surgeon: Dorothy Spark, MD;  Location: [facility];  Service: Cardiovascular;  Laterality: N/A;   [place] Bilateral    left- 2005 and right- 2010    Current Medications: Current Meds  Medication Sig   albuterol (PROVENTIL) (2.5 MG/3ML) 0.083% nebulizer solution Take 2.5 mg by nebulization every 6 (six) hours as needed for wheezing or shortness of breath.    aspirin EC 81 MG tablet Take 1 tablet (81 mg total) by mouth daily.   b complex vitamins tablet Take 1 tablet by mouth daily.   Calcium Carb-Cholecalciferol (CALCIUM 600 + D PO) Take 2 tablets by mouth daily.   cetirizine (ZYRTEC) 10 MG tablet Take 10 mg by mouth daily.   cyclobenzaprine (FLEXERIL) 10 MG tablet Take 10 mg by mouth at bedtime as needed for muscle spasms.    fluticasone (FLONASE) 50 MCG/ACT nasal spray Place 2 sprays into both nostrils as needed for allergies or rhinitis.    furosemide (LASIX) 40 MG tablet Take 2 tablets (80 mg total) by mouth every morning AND 1 tablet (40 mg total) every evening.   JARDIANCE 10 MG TABS tablet TAKE 1 TABLET DAILY BEFORE BREAKFAST   levalbuterol (XOPENEX HFA) 45 MCG/ACT inhaler Inhale 2 puffs into the lungs every 4 (four) hours as needed for wheezing.   meclizine (ANTIVERT) 25 MG tablet Take 25 mg by mouth every 8 (eight) hours as needed for dizziness or nausea.    metFORMIN (GLUCOPHAGE) 500 MG tablet Take 250 mg by mouth 2 (two) times daily. 1/2 tablet bid   metoprolol succinate (TOPROL-XL) 100 MG 24 hr tablet Take 1 tablet (100 mg total) by mouth 2 (two) times daily.   montelukast (SINGULAIR) 10 MG tablet Take 10 mg by mouth at bedtime.    mupirocin ointment (BACTROBAN) 2 % Apply 1 application topically 2 (two) times daily as needed for rash (eczema).   naproxen sodium (ALEVE) 220 MG tablet Take 440 mg by mouth  daily as needed for pain (pain).   nitroGLYCERIN (NITROSTAT) 0.4 MG SL tablet Place 1 tablet (0.4 mg total) under the tongue every 5 (five) minutes as needed for chest pain.   pantoprazole (PROTONIX) 40 MG tablet Take 40 mg by mouth daily.   Polyethyl Glycol-Propyl Glycol (SYSTANE OP) Place 1 drop into both eyes 4 (four) times daily.   polyethylene glycol (MIRALAX / GLYCOLAX) packet Take 17 g by mouth daily as needed for mild constipation.    rosuvastatin (CRESTOR) 20 MG tablet Take 1 tablet (20 mg total) by mouth daily.   spironolactone (ALDACTONE) 25 MG tablet TAKE 1 TABLET AT BEDTIME (DOSE CHANGE)   TIROSINT 75 MCG CAPS Take 1 capsule by mouth daily.   vitamin E 400 UNIT capsule Take 400 Units by mouth daily.    Wheat Dextrin (BENEFIBER) POWD Take 1 Scoop by mouth daily as needed for constipation (constipation).     Allergies:   Ace inhibitors, Angiotensin receptor blockers, Entresto [sacubitril-valsartan], Tussionex pennkinetic er [employer] poli-chlorphe poli er], Valsartan, Cephalexin, Codeine, Sulfamethoxazole,  Cefuroxime axetil, Cephalosporins, Iohexol, and Moxifloxacin   Social History   Socioeconomic History   Marital status: Widowed    Spouse name: Not on file   Number of children: Not on file   Years of education: Not on file   Highest education level: Not on file  Occupational History   Not on file  Tobacco Use   Smoking status: Former   Smokeless tobacco: Never  Vaping Use   Vaping Use: Never used  Substance and Sexual Activity   Alcohol use: No   Drug use: No   Sexual activity: Not on file  Other Topics Concern   Not on file  Social History Narrative   Not on file   Social Determinants of Health   Financial Resource Strain: Not on file  Food Insecurity: Not on file  Transportation Needs: Not on file  Physical Activity: Not on file  Stress: Not on file  Social Connections: Not on file     Family History: The patient's family history includes Asthma in her brother and sister; Breast cancer in her sister; COPD in her brother and sister; Diabetes in her brother, mother, and sister; Emphysema in her brother, father, and sister; Heart attack in her father and mother; Heart disease in her brother and mother; Hypertension in her father, mother, and sister; Lung cancer in her brother.  ROS:   Please see the history of present illness.    All other systems reviewed and are negative.  EKGs/Labs/Other Studies Reviewed:    The following studies were reviewed today: I discussed my findings with the patient at length.   Recent Labs: 04/22/2021: ALT 18 09/01/2021: Hemoglobin 15.3; Platelets 307 01/19/2022: B Natriuretic Peptide 54.2; BUN 23; Creatinine, Ser 1.31; Potassium 3.5; Sodium 137  Recent Lipid Panel    Component Value Date/Time   CHOL 126 01/19/2022 1601   CHOL 203 (H) 04/22/2021 1028   TRIG 276 (H) 01/19/2022 1601   HDL 36 (L) 01/19/2022 1601   HDL 46 04/22/2021 1028   CHOLHDL 3.5 01/19/2022 1601   VLDL 55 (H) 01/19/2022 1601   LDLCALC 35 01/19/2022 1601    LDLCALC 104 (H) 04/22/2021 1028    Physical Exam:    VS:  BP (!) 150/60 (BP Location: Right Arm, Patient Position: Sitting, Cuff Size: Normal)   Pulse 80   Ht '5\' 1"'$  (1.549 m)   Wt 153 lb (69.4 kg)   SpO2 91%   BMI  28.91 kg/m     Wt Readings from Last 3 Encounters:  04/01/22 153 lb (69.4 kg)  01/19/22 157 lb (71.2 kg)  10/23/21 158 lb 9.6 oz (71.9 kg)     GEN: Patient is in no acute distress HEENT: Normal NECK: No JVD; No carotid bruits LYMPHATICS: No lymphadenopathy CARDIAC: Hear sounds regular, 2/6 systolic murmur at the apex. RESPIRATORY:  Clear to auscultation without rales, wheezing or rhonchi  ABDOMEN: Soft, non-tender, non-distended MUSCULOSKELETAL:  No edema; No deformity  SKIN: Warm and dry NEUROLOGIC:  Alert and oriented x 3 PSYCHIATRIC:  Normal affect   Signed, Vanessa Lindau, MD  04/01/2022 10:12 AM    [place]

## 2022-04-01 NOTE — Patient Instructions (Signed)
Medication Instructions:  Your physician recommends that you continue on your current medications as directed. Please refer to the Current Medication list given to you today.  *If you need a refill on your cardiac medications before your next appointment, please call your pharmacy*   Lab Work: None If you have labs (blood work) drawn today and your tests are completely normal, you will receive your results only by: MyChart Message (if you have MyChart) OR A paper copy in the mail If you have any lab test that is abnormal or we need to change your treatment, we will call you to review the results.   Testing/Procedures: None   Follow-Up: At [facility], you and your health needs are our priority.  As part of our continuing mission to provide you with exceptional heart care, we have created designated Provider Care Teams.  These Care Teams include your primary Cardiologist (physician) and Advanced Practice Providers (APPs -  Physician Assistants and Nurse Practitioners) who all work together to provide you with the care you need, when you need it.  We recommend signing up for the patient portal called "MyChart".  Sign up information is provided on this After Visit Summary.  MyChart is used to connect with patients for Virtual Visits (Telemedicine).  Patients are able to view lab/test results, encounter notes, upcoming appointments, etc.  Non-urgent messages can be sent to your provider as well.   To learn more about what you can do with MyChart, go to [email or web address].    Your next appointment:   9 month(s)  The format for your next appointment:   In Person  Provider:   Rajan Revankar, MD.   Other Instructions None  Important Information About Sugar       

## 2022-04-12 DIAGNOSIS — E1140 Type 2 diabetes mellitus with diabetic neuropathy, unspecified: Secondary | ICD-10-CM | POA: Diagnosis not present

## 2022-04-12 DIAGNOSIS — E785 Hyperlipidemia, unspecified: Secondary | ICD-10-CM | POA: Diagnosis not present

## 2022-04-12 DIAGNOSIS — J45909 Unspecified asthma, uncomplicated: Secondary | ICD-10-CM | POA: Diagnosis not present

## 2022-04-12 DIAGNOSIS — D509 Iron deficiency anemia, unspecified: Secondary | ICD-10-CM | POA: Diagnosis not present

## 2022-04-12 DIAGNOSIS — E559 Vitamin D deficiency, unspecified: Secondary | ICD-10-CM | POA: Diagnosis not present

## 2022-04-12 DIAGNOSIS — I10 Essential (primary) hypertension: Secondary | ICD-10-CM | POA: Diagnosis not present

## 2022-04-12 DIAGNOSIS — Z950 Presence of cardiac pacemaker: Secondary | ICD-10-CM | POA: Diagnosis not present

## 2022-04-12 DIAGNOSIS — E039 Hypothyroidism, unspecified: Secondary | ICD-10-CM | POA: Diagnosis not present

## 2022-04-12 DIAGNOSIS — E119 Type 2 diabetes mellitus without complications: Secondary | ICD-10-CM | POA: Diagnosis not present

## 2022-04-12 DIAGNOSIS — J309 Allergic rhinitis, unspecified: Secondary | ICD-10-CM | POA: Diagnosis not present

## 2022-04-12 DIAGNOSIS — Z8679 Personal history of other diseases of the circulatory system: Secondary | ICD-10-CM | POA: Diagnosis not present

## 2022-04-12 DIAGNOSIS — R413 Other amnesia: Secondary | ICD-10-CM | POA: Diagnosis not present

## 2022-04-22 ENCOUNTER — Ambulatory Visit (INDEPENDENT_AMBULATORY_CARE_PROVIDER_SITE_OTHER): Payer: Medicare Other

## 2022-04-22 DIAGNOSIS — I428 Other cardiomyopathies: Secondary | ICD-10-CM

## 2022-04-22 DIAGNOSIS — I5022 Chronic systolic (congestive) heart failure: Secondary | ICD-10-CM

## 2022-04-22 LAB — CUP PACEART REMOTE DEVICE CHECK
Battery Remaining Longevity: 67 mo
Battery Remaining Percentage: 75 %
Battery Voltage: 2.98 V
Brady Statistic AP VP Percent: 1 %
Brady Statistic AP VS Percent: 1 %
Brady Statistic AS VP Percent: 99 %
Brady Statistic AS VS Percent: 1 %
Brady Statistic RA Percent Paced: 1 %
Date Time Interrogation Session: 20230629020009
Implantable Lead Implant Date: 20210701
Implantable Lead Implant Date: 20210701
Implantable Lead Implant Date: 20210701
Implantable Lead Location: 753858
Implantable Lead Location: 753859
Implantable Lead Location: 753860
Implantable Pulse Generator Implant Date: 20210701
Lead Channel Impedance Value: 410 Ohm
Lead Channel Impedance Value: 540 Ohm
Lead Channel Impedance Value: 940 Ohm
Lead Channel Pacing Threshold Amplitude: 0.75 V
Lead Channel Pacing Threshold Amplitude: 0.75 V
Lead Channel Pacing Threshold Amplitude: 0.75 V
Lead Channel Pacing Threshold Pulse Width: 0.5 ms
Lead Channel Pacing Threshold Pulse Width: 0.5 ms
Lead Channel Pacing Threshold Pulse Width: 0.5 ms
Lead Channel Sensing Intrinsic Amplitude: 12 mV
Lead Channel Sensing Intrinsic Amplitude: 3 mV
Lead Channel Setting Pacing Amplitude: 2 V
Lead Channel Setting Pacing Amplitude: 2.5 V
Lead Channel Setting Pacing Amplitude: 2.5 V
Lead Channel Setting Pacing Pulse Width: 0.5 ms
Lead Channel Setting Pacing Pulse Width: 0.5 ms
Lead Channel Setting Sensing Sensitivity: 2 mV
Pulse Gen Model: 3562
Pulse Gen Serial Number: 3818336

## 2022-05-03 ENCOUNTER — Telehealth (HOSPITAL_COMMUNITY): Payer: Self-pay | Admitting: *Deleted

## 2022-05-03 NOTE — Telephone Encounter (Signed)
Pt left vm to confirm her appt on 7/12. I called pt back no answer/left vm.

## 2022-05-04 NOTE — Progress Notes (Signed)
PCP: [name], MD  Cardiology: [name], MD HF Cardiology: Dr. [name]  86 y.o. with history of chronic systolic CHF (nonischemic cardiomyopathy), chronic LBBB, and mitral regurgitation was referred by Dr. Burt Knack for CHF evaluation prior to Mitraclip placement.   CHF was first noted in 12/20, she was admitted to the hospital in [facility] at that time.  LHC showed nonobstructive CAD.  She later followed up with Dr. Geraldo Pitter and had echo in 3/21, showing EF 35-40% with concern for severe MR.  TEE was then done in 4/21 and I reviewed it today, showing EF 30-35% with septal-lateral dyssynchrony, moderate-severe functional MR, normal RV, severe TR.  She has had a LBBB on ECGs in 2021, she did not have LBBB in 2019.    Cardiac MRI in 6/21 showed moderate LV dilation, EF 27%, moderately decreased RV function with EF 31%, no LGE, probably moderate MR.   She had St Jude CRT-P device implanted in 7/21.  Echo in 9/21 showed EF up to 40% with normal RV, mild-moderate MR. Echo was done today and reviewed, EF 40-45%, mild LVH, mildly decreased RV systolic function, moderate MR.   She returns for followup of CHF.  Weight down 1 lb. She was unable to tolerate Entresto or valsartan due to angioedema.  She has mild dyspnea carrying groceries but generally no dyspnea walking on flat ground.  No chest pain. No orthopnea/PND.  No lightheadedness.   Labs (4/21): K 4.1, creatinine 1.16 Labs (5/21): K 3.8, creatinine 1.21 => 1.1, BNP 2424 => 2201, digoxin 0.4 Labs (6/21): K 4.6, creatinine 1.17, hgb 10.9 Labs (7/21): K 4.4, creatinine 1.11, digoxin 0.8 Labs (11/21): K 4.8, creatinine 1.22 Labs (3/22): K 4.5, creatinine 1.25, LDL 96, TGs 291 Labs (5/22): K 4.6, creatinine 1.2 Labs (6/22): LDL 104, LFTs normal Labs (12/22): K 5.2, creatinine 1.31 Labs (1/23): K 4.1, creatinine 1.08  St Jude device interrogation: >99% BiV pacing, no AF, stable thoracic impedance.   PMH: 1. LBBB: Chronic.  2. Anemia:  Prior GI workup in [place] was negative.  No history of overt GI bleeding. Fe deficiency.  3. Type 2 diabetes 4. Hyperlipidemia 5. CAD: LHC (2020) with 40% ostial left main, 50% mid RCA.  6. Chronic systolic CHF: Noted since 12/20.  Nonischemic cardiomyopathy.  - TEE (4/21): EF 30-35%, septal-lateral dyssynchrony, moderate-severe MR appears functional, normal RV size and systolic function, severe biatrial enlargement, severe TR.  - Cardiac MRI (6/21): Moderate LV dilation, EF 27%; moderately decreased RV function with EF 31%, no LGE, probably moderate MR.  - St Jude CRT-P implanted 7/21.  - Echo (9/21): EF 40%, diffuse hypokinesis, mildly decreased RV systolic function, mild-moderate MR, normal IVC.  - Echo (3/23): EF 40-45%, mild LVH, mildly decreased RV systolic function, moderate MR. 7. GERD 8. Hyperlipidemia 9. Hypothyroidism 10. Mitral regurgitation: Suspected to be functional.  Echo in 9/21 with improved MR, appears mild-moderate.  11. Fe deficiency anemia 12. Fatty liver 13. CKD stage 3  Social History   Socioeconomic History   Marital status: Widowed    Spouse name: Not on file   Number of children: Not on file   Years of education: Not on file   Highest education level: Not on file  Occupational History   Not on file  Tobacco Use   Smoking status: Former   Smokeless tobacco: Never  Vaping Use   Vaping Use: Never used  Substance and Sexual Activity   Alcohol use: No   Drug use: No  Sexual activity: Not on file  Other Topics Concern   Not on file  Social History Narrative   Not on file   Social Determinants of Health   Financial Resource Strain: Not on file  Food Insecurity: Not on file  Transportation Needs: Not on file  Physical Activity: Not on file  Stress: Not on file  Social Connections: Not on file  Intimate Partner Violence: Not on file   Family History  Problem Relation Age of Onset   Diabetes Mother    Heart disease Mother    Hypertension  Mother    Heart attack Mother    Emphysema Father    Hypertension Father    Heart attack Father    Breast cancer Sister    Diabetes Sister    Emphysema Sister    COPD Sister    Asthma Sister    Hypertension Sister    Lung cancer Brother    Diabetes Brother    Emphysema Brother    COPD Brother    Asthma Brother    Heart disease Brother    ROS: All systems reviewed and negative except as per HPI.   Current Outpatient Medications  Medication Sig Dispense Refill   albuterol (PROVENTIL) (2.5 MG/3ML) 0.083% nebulizer solution Take 2.5 mg by nebulization every 6 (six) hours as needed for wheezing or shortness of breath.      aspirin EC 81 MG tablet Take 1 tablet (81 mg total) by mouth daily. 30 tablet 11   b complex vitamins tablet Take 1 tablet by mouth daily.     Calcium Carb-Cholecalciferol (CALCIUM 600 + D PO) Take 2 tablets by mouth daily.     cetirizine (ZYRTEC) 10 MG tablet Take 10 mg by mouth daily.     cyclobenzaprine (FLEXERIL) 10 MG tablet Take 10 mg by mouth at bedtime as needed for muscle spasms.      fluticasone (FLONASE) 50 MCG/ACT nasal spray Place 2 sprays into both nostrils as needed for allergies or rhinitis.     furosemide (LASIX) 40 MG tablet Take 2 tablets (80 mg total) by mouth every morning AND 1 tablet (40 mg total) every evening. 315 tablet 3   JARDIANCE 10 MG TABS tablet TAKE 1 TABLET DAILY BEFORE BREAKFAST 90 tablet 3   levalbuterol (XOPENEX HFA) 45 MCG/ACT inhaler Inhale 2 puffs into the lungs every 4 (four) hours as needed for wheezing.     meclizine (ANTIVERT) 25 MG tablet Take 25 mg by mouth every 8 (eight) hours as needed for dizziness or nausea.      metFORMIN (GLUCOPHAGE) 500 MG tablet Take 250 mg by mouth 2 (two) times daily. 1/2 tablet bid     metoprolol succinate (TOPROL-XL) 100 MG 24 hr tablet Take 1 tablet (100 mg total) by mouth 2 (two) times daily. 135 tablet 3   montelukast (SINGULAIR) 10 MG tablet Take 10 mg by mouth at bedtime.      mupirocin  ointment (BACTROBAN) 2 % Apply 1 application topically 2 (two) times daily as needed for rash (eczema).     naproxen sodium (ALEVE) 220 MG tablet Take 440 mg by mouth daily as needed for pain (pain).     nitroGLYCERIN (NITROSTAT) 0.4 MG SL tablet Place 1 tablet (0.4 mg total) under the tongue every 5 (five) minutes as needed for chest pain. 30 tablet 2   pantoprazole (PROTONIX) 40 MG tablet Take 40 mg by mouth daily.     Polyethyl Glycol-Propyl Glycol (SYSTANE OP) Place 1 drop into  both eyes 4 (four) times daily.     polyethylene glycol (MIRALAX / GLYCOLAX) packet Take 17 g by mouth daily as needed for mild constipation.      rosuvastatin (CRESTOR) 20 MG tablet Take 1 tablet (20 mg total) by mouth daily. 90 tablet 3   spironolactone (ALDACTONE) 25 MG tablet TAKE 1 TABLET AT BEDTIME (DOSE CHANGE) 90 tablet 3   TIROSINT 75 MCG CAPS Take 1 capsule by mouth daily.     vitamin E 400 UNIT capsule Take 400 Units by mouth daily.      Wheat Dextrin (BENEFIBER) POWD Take 1 Scoop by mouth daily as needed for constipation (constipation).     No current facility-administered medications for this visit.   There were no vitals taken for this visit.  General: NAD Neck: No JVD, no thyromegaly or thyroid nodule.  Lungs: Clear to auscultation bilaterally with normal respiratory effort. CV: Nondisplaced PMI.  Heart regular S1/S2, no S3/S4, no murmur.  No peripheral edema.  No carotid bruit.  Normal pedal pulses.  Abdomen: Soft, nontender, no hepatosplenomegaly, no distention.  Skin: Intact without lesions or rashes.  Neurologic: Alert and oriented x 3.  Psych: Normal affect. Extremities: No clubbing or cyanosis.  HEENT: Normal.   Assessment/Plan: 1. Chronic systolic CHF: Patient has a nonischemic cardiomyopathy of uncertain etiology.  She has significant mitral regurgitation.  This is functional, and I do not think that it explains her cardiomyopathy (though mitral regurgitation likely worsens symptoms).  She  has a LBBB that is new since the prior ECG in 2019, cannot rule out LBBB cardiomyopathy.  Prior myocarditis is also a consideration.  TEE in 4/21 showed EF 30-35% with prominent septal-lateral dyssynchrony.  Cardiac MRI in 6/21 showed moderate LV dilation, EF 27%, moderately decreased RV function with EF 31%, no LGE, probably moderate MR.  St Jude CRT-P device implanted in 7/21.  Echo in 9/21 with EF up to 40%, improved MR (only mild-moderate). Echo today showed EF 40-45%, mild LVH, mildly decreased RV systolic function, moderate MR.  NYHA class II symptoms, she is not volume overloaded by exam or [place]. - Continue Lasix 80 qam/40 qpm.  BMET/BNP today.  - Unable to take Entresto or valsartan due to angioedema. Would not, therefore, try ACEI.  - Continue spironolactone 25 mg daily.  - Increase Toprol XL to 100 mg bid.    - Continue Jardiance 10 mg daily.   2. CAD: Nonobstructive on 2020 cath.  No chest pain.  - Continue ASA 81  - Continue Crestor 20 daily, check lipids today.   3. Mitral regurgitation: She has functional mitral regurgitation, moderate-severe/3+ at least on TEE in 4/21.  However, after CRT, mitral regurgitation was mild-moderate on 9/21 echo and moderate on 3/23 echo.  4. Fatty liver: On Crestor.  5. CKD: Stage 3.   - Avoid NSAIDs.    Followup in 4 months with APP.  [place] 05/04/2022

## 2022-05-05 ENCOUNTER — Ambulatory Visit (HOSPITAL_COMMUNITY)
Admission: RE | Admit: 2022-05-05 | Discharge: 2022-05-05 | Disposition: A | Discharge status: Home / Self Care | Payer: Medicare Other | Source: Ambulatory Visit | Attending: Family Medicine | Admitting: Family Medicine

## 2022-05-05 ENCOUNTER — Encounter (HOSPITAL_COMMUNITY): Payer: Self-pay

## 2022-05-05 VITALS — BP 142/70 | HR 74 | Wt 152.2 lb

## 2022-05-05 DIAGNOSIS — I2510 Atherosclerotic heart disease of native coronary artery without angina pectoris: Secondary | ICD-10-CM | POA: Insufficient documentation

## 2022-05-05 DIAGNOSIS — E1122 Type 2 diabetes mellitus with diabetic chronic kidney disease: Secondary | ICD-10-CM | POA: Insufficient documentation

## 2022-05-05 DIAGNOSIS — I130 Hypertensive heart and chronic kidney disease with heart failure and stage 1 through stage 4 chronic kidney disease, or unspecified chronic kidney disease: Secondary | ICD-10-CM | POA: Insufficient documentation

## 2022-05-05 DIAGNOSIS — K760 Fatty (change of) liver, not elsewhere classified: Secondary | ICD-10-CM | POA: Diagnosis not present

## 2022-05-05 DIAGNOSIS — E039 Hypothyroidism, unspecified: Secondary | ICD-10-CM | POA: Insufficient documentation

## 2022-05-05 DIAGNOSIS — D509 Iron deficiency anemia, unspecified: Secondary | ICD-10-CM | POA: Insufficient documentation

## 2022-05-05 DIAGNOSIS — K219 Gastro-esophageal reflux disease without esophagitis: Secondary | ICD-10-CM | POA: Diagnosis not present

## 2022-05-05 DIAGNOSIS — I428 Other cardiomyopathies: Secondary | ICD-10-CM | POA: Diagnosis not present

## 2022-05-05 DIAGNOSIS — I5022 Chronic systolic (congestive) heart failure: Secondary | ICD-10-CM | POA: Diagnosis not present

## 2022-05-05 DIAGNOSIS — Z7984 Long term (current) use of oral hypoglycemic drugs: Secondary | ICD-10-CM | POA: Diagnosis not present

## 2022-05-05 DIAGNOSIS — N183 Chronic kidney disease, stage 3 unspecified: Secondary | ICD-10-CM | POA: Insufficient documentation

## 2022-05-05 DIAGNOSIS — I340 Nonrheumatic mitral (valve) insufficiency: Secondary | ICD-10-CM | POA: Diagnosis not present

## 2022-05-05 DIAGNOSIS — R0600 Dyspnea, unspecified: Secondary | ICD-10-CM | POA: Diagnosis not present

## 2022-05-05 DIAGNOSIS — E785 Hyperlipidemia, unspecified: Secondary | ICD-10-CM | POA: Diagnosis not present

## 2022-05-05 DIAGNOSIS — I447 Left bundle-branch block, unspecified: Secondary | ICD-10-CM | POA: Diagnosis not present

## 2022-05-05 LAB — BASIC METABOLIC PANEL
Anion gap: 11 (ref 5–15)
BUN: 25 mg/dL — ABNORMAL HIGH (ref 8–23)
CO2: 28 mmol/L (ref 22–32)
Calcium: 9.6 mg/dL (ref 8.9–10.3)
Chloride: 99 mmol/L (ref 98–111)
Creatinine, Ser: 1.12 mg/dL — ABNORMAL HIGH (ref 0.44–1.00)
GFR, Estimated: 48 mL/min — ABNORMAL LOW (ref >60)
Glucose, Bld: 119 mg/dL — ABNORMAL HIGH (ref 70–99)
Potassium: 4.4 mmol/L (ref 3.5–5.1)
Sodium: 138 mmol/L (ref 135–145)

## 2022-05-05 NOTE — Addendum Note (Signed)
Encounter addended by: Payton Mccallum, RN on: 05/05/2022 1:58 PM  Actions taken: Clinical Note Signed

## 2022-05-05 NOTE — Patient Instructions (Addendum)
Thank you for coming in today  Labs were done today, if any labs are abnormal the clinic will call you No news is good news  Your physician recommends that you schedule a follow-up appointment in:  5-6 months with Dr. [name]   At the [facility], you and your health needs are our priority. As part of our continuing mission to provide you with exceptional heart care, we have created designated Provider Care Teams. These Care Teams include your primary Cardiologist (physician) and Advanced Practice Providers (APPs- Physician Assistants and Nurse Practitioners) who all work together to provide you with the care you need, when you need it.   You may see any of the following providers on your designated Care Team at your next follow up: Dr Glori Bickers Dr Haynes Kerns, NP Lyda Jester, [place] [facility] [place], [place] Audry Riles, PharmD   Please be sure to bring in all your medications bottles to every appointment.   If you have any questions or concerns before your next appointment please send [place] a message through [place] or call our office at [phone].    TO LEAVE A MESSAGE FOR THE NURSE SELECT OPTION 2, PLEASE LEAVE A MESSAGE INCLUDING: YOUR NAME DATE OF BIRTH CALL BACK NUMBER REASON FOR CALL**this is important as we prioritize the call backs  YOU WILL RECEIVE A CALL BACK THE SAME DAY AS LONG AS YOU CALL BEFORE 4:00 PM

## 2022-05-10 NOTE — Progress Notes (Signed)
Remote pacemaker transmission.   

## 2022-05-31 ENCOUNTER — Other Ambulatory Visit (HOSPITAL_COMMUNITY): Payer: Self-pay | Admitting: Cardiology

## 2022-06-15 DIAGNOSIS — Z8616 Personal history of COVID-19: Secondary | ICD-10-CM | POA: Diagnosis not present

## 2022-06-15 DIAGNOSIS — R042 Hemoptysis: Secondary | ICD-10-CM | POA: Diagnosis not present

## 2022-06-15 DIAGNOSIS — R0600 Dyspnea, unspecified: Secondary | ICD-10-CM | POA: Diagnosis not present

## 2022-06-15 DIAGNOSIS — E538 Deficiency of other specified B group vitamins: Secondary | ICD-10-CM | POA: Diagnosis not present

## 2022-06-15 DIAGNOSIS — R5383 Other fatigue: Secondary | ICD-10-CM | POA: Diagnosis not present

## 2022-06-15 DIAGNOSIS — G4733 Obstructive sleep apnea (adult) (pediatric): Secondary | ICD-10-CM | POA: Diagnosis not present

## 2022-06-15 DIAGNOSIS — E559 Vitamin D deficiency, unspecified: Secondary | ICD-10-CM | POA: Diagnosis not present

## 2022-06-15 DIAGNOSIS — J310 Chronic rhinitis: Secondary | ICD-10-CM | POA: Diagnosis not present

## 2022-06-15 DIAGNOSIS — J4520 Mild intermittent asthma, uncomplicated: Secondary | ICD-10-CM | POA: Diagnosis not present

## 2022-06-22 DIAGNOSIS — R911 Solitary pulmonary nodule: Secondary | ICD-10-CM | POA: Diagnosis not present

## 2022-06-22 DIAGNOSIS — J9811 Atelectasis: Secondary | ICD-10-CM | POA: Diagnosis not present

## 2022-06-22 DIAGNOSIS — I2510 Atherosclerotic heart disease of native coronary artery without angina pectoris: Secondary | ICD-10-CM | POA: Diagnosis not present

## 2022-06-22 DIAGNOSIS — I700 Atherosclerosis of aorta: Secondary | ICD-10-CM | POA: Diagnosis not present

## 2022-06-22 DIAGNOSIS — R918 Other nonspecific abnormal finding of lung field: Secondary | ICD-10-CM | POA: Diagnosis not present

## 2022-07-05 DIAGNOSIS — R5383 Other fatigue: Secondary | ICD-10-CM | POA: Diagnosis not present

## 2022-07-05 DIAGNOSIS — J310 Chronic rhinitis: Secondary | ICD-10-CM | POA: Diagnosis not present

## 2022-07-05 DIAGNOSIS — G4733 Obstructive sleep apnea (adult) (pediatric): Secondary | ICD-10-CM | POA: Diagnosis not present

## 2022-07-05 DIAGNOSIS — J4520 Mild intermittent asthma, uncomplicated: Secondary | ICD-10-CM | POA: Diagnosis not present

## 2022-07-05 DIAGNOSIS — Z8616 Personal history of COVID-19: Secondary | ICD-10-CM | POA: Diagnosis not present

## 2022-07-05 DIAGNOSIS — R0600 Dyspnea, unspecified: Secondary | ICD-10-CM | POA: Diagnosis not present

## 2022-07-13 ENCOUNTER — Other Ambulatory Visit (HOSPITAL_COMMUNITY): Payer: Self-pay | Admitting: *Deleted

## 2022-07-13 MED ORDER — FUROSEMIDE 40 MG PO TABS: ORAL_TABLET | ORAL | 3 refills | Status: DC

## 2022-07-22 ENCOUNTER — Ambulatory Visit (INDEPENDENT_AMBULATORY_CARE_PROVIDER_SITE_OTHER): Payer: Medicare Other

## 2022-07-22 DIAGNOSIS — I428 Other cardiomyopathies: Secondary | ICD-10-CM

## 2022-07-23 LAB — CUP PACEART REMOTE DEVICE CHECK
Battery Remaining Longevity: 64 mo
Battery Remaining Percentage: 72 %
Battery Voltage: 2.98 V
Brady Statistic AP VP Percent: 1 %
Brady Statistic AP VS Percent: 1 %
Brady Statistic AS VP Percent: 99 %
Brady Statistic AS VS Percent: 1 %
Brady Statistic RA Percent Paced: 1 %
Date Time Interrogation Session: 20230928020010
Implantable Lead Implant Date: 20210701
Implantable Lead Implant Date: 20210701
Implantable Lead Implant Date: 20210701
Implantable Lead Location: 753858
Implantable Lead Location: 753859
Implantable Lead Location: 753860
Implantable Pulse Generator Implant Date: 20210701
Lead Channel Impedance Value: 440 Ohm
Lead Channel Impedance Value: 530 Ohm
Lead Channel Impedance Value: 840 Ohm
Lead Channel Pacing Threshold Amplitude: 0.75 V
Lead Channel Pacing Threshold Amplitude: 0.75 V
Lead Channel Pacing Threshold Amplitude: 0.75 V
Lead Channel Pacing Threshold Pulse Width: 0.5 ms
Lead Channel Pacing Threshold Pulse Width: 0.5 ms
Lead Channel Pacing Threshold Pulse Width: 0.5 ms
Lead Channel Sensing Intrinsic Amplitude: 12 mV
Lead Channel Sensing Intrinsic Amplitude: 3.1 mV
Lead Channel Setting Pacing Amplitude: 2 V
Lead Channel Setting Pacing Amplitude: 2.5 V
Lead Channel Setting Pacing Amplitude: 2.5 V
Lead Channel Setting Pacing Pulse Width: 0.5 ms
Lead Channel Setting Pacing Pulse Width: 0.5 ms
Lead Channel Setting Sensing Sensitivity: 2 mV
Pulse Gen Model: 3562
Pulse Gen Serial Number: 3818336

## 2022-07-28 NOTE — Progress Notes (Signed)
Remote pacemaker transmission.   

## 2022-08-03 DIAGNOSIS — K219 Gastro-esophageal reflux disease without esophagitis: Secondary | ICD-10-CM | POA: Diagnosis not present

## 2022-08-03 DIAGNOSIS — J45909 Unspecified asthma, uncomplicated: Secondary | ICD-10-CM | POA: Diagnosis not present

## 2022-08-03 DIAGNOSIS — J309 Allergic rhinitis, unspecified: Secondary | ICD-10-CM | POA: Diagnosis not present

## 2022-08-03 DIAGNOSIS — E1140 Type 2 diabetes mellitus with diabetic neuropathy, unspecified: Secondary | ICD-10-CM | POA: Diagnosis not present

## 2022-08-03 DIAGNOSIS — E559 Vitamin D deficiency, unspecified: Secondary | ICD-10-CM | POA: Diagnosis not present

## 2022-08-03 DIAGNOSIS — E785 Hyperlipidemia, unspecified: Secondary | ICD-10-CM | POA: Diagnosis not present

## 2022-08-03 DIAGNOSIS — Z950 Presence of cardiac pacemaker: Secondary | ICD-10-CM | POA: Diagnosis not present

## 2022-08-03 DIAGNOSIS — D509 Iron deficiency anemia, unspecified: Secondary | ICD-10-CM | POA: Diagnosis not present

## 2022-08-03 DIAGNOSIS — Z8679 Personal history of other diseases of the circulatory system: Secondary | ICD-10-CM | POA: Diagnosis not present

## 2022-08-03 DIAGNOSIS — R413 Other amnesia: Secondary | ICD-10-CM | POA: Diagnosis not present

## 2022-08-03 DIAGNOSIS — E039 Hypothyroidism, unspecified: Secondary | ICD-10-CM | POA: Diagnosis not present

## 2022-08-03 DIAGNOSIS — I10 Essential (primary) hypertension: Secondary | ICD-10-CM | POA: Diagnosis not present

## 2022-08-05 DIAGNOSIS — K7581 Nonalcoholic steatohepatitis (NASH): Secondary | ICD-10-CM | POA: Diagnosis not present

## 2022-08-09 DIAGNOSIS — K219 Gastro-esophageal reflux disease without esophagitis: Secondary | ICD-10-CM | POA: Diagnosis not present

## 2022-08-09 DIAGNOSIS — K7581 Nonalcoholic steatohepatitis (NASH): Secondary | ICD-10-CM | POA: Diagnosis not present

## 2022-08-09 DIAGNOSIS — D500 Iron deficiency anemia secondary to blood loss (chronic): Secondary | ICD-10-CM | POA: Diagnosis not present

## 2022-08-09 DIAGNOSIS — R195 Other fecal abnormalities: Secondary | ICD-10-CM | POA: Diagnosis not present

## 2022-08-18 DIAGNOSIS — J019 Acute sinusitis, unspecified: Secondary | ICD-10-CM | POA: Diagnosis not present

## 2022-08-18 DIAGNOSIS — R0982 Postnasal drip: Secondary | ICD-10-CM | POA: Diagnosis not present

## 2022-08-18 DIAGNOSIS — R051 Acute cough: Secondary | ICD-10-CM | POA: Diagnosis not present

## 2022-08-18 DIAGNOSIS — R070 Pain in throat: Secondary | ICD-10-CM | POA: Diagnosis not present

## 2022-08-25 ENCOUNTER — Other Ambulatory Visit (HOSPITAL_COMMUNITY): Payer: Self-pay | Admitting: Cardiology

## 2022-09-30 ENCOUNTER — Other Ambulatory Visit (HOSPITAL_COMMUNITY): Payer: Self-pay | Admitting: Cardiology

## 2022-10-11 DIAGNOSIS — R0600 Dyspnea, unspecified: Secondary | ICD-10-CM | POA: Diagnosis not present

## 2022-10-11 DIAGNOSIS — G4733 Obstructive sleep apnea (adult) (pediatric): Secondary | ICD-10-CM | POA: Diagnosis not present

## 2022-10-11 DIAGNOSIS — Z8616 Personal history of COVID-19: Secondary | ICD-10-CM | POA: Diagnosis not present

## 2022-10-11 DIAGNOSIS — J4520 Mild intermittent asthma, uncomplicated: Secondary | ICD-10-CM | POA: Diagnosis not present

## 2022-10-11 DIAGNOSIS — R5383 Other fatigue: Secondary | ICD-10-CM | POA: Diagnosis not present

## 2022-10-11 DIAGNOSIS — J310 Chronic rhinitis: Secondary | ICD-10-CM | POA: Diagnosis not present

## 2022-10-21 ENCOUNTER — Ambulatory Visit (INDEPENDENT_AMBULATORY_CARE_PROVIDER_SITE_OTHER): Payer: Medicare Other

## 2022-10-21 DIAGNOSIS — I428 Other cardiomyopathies: Secondary | ICD-10-CM | POA: Diagnosis not present

## 2022-10-21 DIAGNOSIS — R0600 Dyspnea, unspecified: Secondary | ICD-10-CM | POA: Diagnosis not present

## 2022-10-21 DIAGNOSIS — D508 Other iron deficiency anemias: Secondary | ICD-10-CM

## 2022-10-21 DIAGNOSIS — D649 Anemia, unspecified: Secondary | ICD-10-CM | POA: Diagnosis not present

## 2022-10-21 DIAGNOSIS — D75839 Thrombocytosis, unspecified: Secondary | ICD-10-CM | POA: Diagnosis not present

## 2022-10-21 LAB — CUP PACEART REMOTE DEVICE CHECK
Battery Remaining Longevity: 61 mo
Battery Remaining Percentage: 69 %
Battery Voltage: 2.98 V
Brady Statistic AP VP Percent: 1 %
Brady Statistic AP VS Percent: 1 %
Brady Statistic AS VP Percent: 99 %
Brady Statistic AS VS Percent: 1 %
Brady Statistic RA Percent Paced: 1 %
Date Time Interrogation Session: 20231228020008
Implantable Lead Connection Status: 753985
Implantable Lead Connection Status: 753985
Implantable Lead Connection Status: 753985
Implantable Lead Implant Date: 20210701
Implantable Lead Implant Date: 20210701
Implantable Lead Implant Date: 20210701
Implantable Lead Location: 753858
Implantable Lead Location: 753859
Implantable Lead Location: 753860
Implantable Pulse Generator Implant Date: 20210701
Lead Channel Impedance Value: 400 Ohm
Lead Channel Impedance Value: 510 Ohm
Lead Channel Impedance Value: 800 Ohm
Lead Channel Pacing Threshold Amplitude: 0.75 V
Lead Channel Pacing Threshold Amplitude: 0.75 V
Lead Channel Pacing Threshold Amplitude: 0.75 V
Lead Channel Pacing Threshold Pulse Width: 0.5 ms
Lead Channel Pacing Threshold Pulse Width: 0.5 ms
Lead Channel Pacing Threshold Pulse Width: 0.5 ms
Lead Channel Sensing Intrinsic Amplitude: 12 mV
Lead Channel Sensing Intrinsic Amplitude: 3.6 mV
Lead Channel Setting Pacing Amplitude: 2 V
Lead Channel Setting Pacing Amplitude: 2.5 V
Lead Channel Setting Pacing Amplitude: 2.5 V
Lead Channel Setting Pacing Pulse Width: 0.5 ms
Lead Channel Setting Pacing Pulse Width: 0.5 ms
Lead Channel Setting Sensing Sensitivity: 2 mV
Pulse Gen Model: 3562
Pulse Gen Serial Number: 3818336

## 2022-10-25 DIAGNOSIS — D649 Anemia, unspecified: Secondary | ICD-10-CM | POA: Insufficient documentation

## 2022-10-25 HISTORY — DX: Anemia, unspecified: D64.9

## 2022-10-26 ENCOUNTER — Other Ambulatory Visit: Payer: Self-pay | Admitting: Oncology

## 2022-10-26 ENCOUNTER — Inpatient Hospital Stay: Payer: Medicare Other | Attending: Oncology | Admitting: Oncology

## 2022-10-26 ENCOUNTER — Inpatient Hospital Stay: Payer: Medicare Other

## 2022-10-26 VITALS — BP 173/72 | HR 76 | Temp 98.0°F | Resp 16 | Ht 61.0 in | Wt 154.0 lb

## 2022-10-26 DIAGNOSIS — D508 Other iron deficiency anemias: Secondary | ICD-10-CM | POA: Diagnosis not present

## 2022-10-26 DIAGNOSIS — D649 Anemia, unspecified: Secondary | ICD-10-CM | POA: Diagnosis not present

## 2022-10-26 DIAGNOSIS — D509 Iron deficiency anemia, unspecified: Secondary | ICD-10-CM | POA: Diagnosis not present

## 2022-10-26 LAB — CBC AND DIFFERENTIAL
HCT: 24 — AB (ref 36–46)
Hemoglobin: 7.7 — AB (ref 12.0–16.0)
Neutrophils Absolute: 4.84
Platelets: 535 10*3/uL — AB (ref 150–400)
WBC: 8.2

## 2022-10-26 LAB — IRON AND TIBC
Iron: 31 ug/dL (ref 28–170)
Saturation Ratios: 5 % — ABNORMAL LOW (ref 10.4–31.8)
TIBC: 595 ug/dL — ABNORMAL HIGH (ref 250–450)
UIBC: 564 ug/dL

## 2022-10-26 LAB — CBC: RBC: 3.45 — AB (ref 3.87–5.11)

## 2022-10-26 LAB — VITAMIN B12: Vitamin B-12: 1050 pg/mL — ABNORMAL HIGH (ref 180–914)

## 2022-10-26 LAB — FOLATE: Folate: >40 ng/mL (ref >5.9)

## 2022-10-26 LAB — FERRITIN: Ferritin: 4 ng/mL — ABNORMAL LOW (ref 11–307)

## 2022-10-26 NOTE — Progress Notes (Signed)
[facility]  [address] [place],  [place]  [address] [phone]  Clinic Day:  10/26/2022  Referring physician: [name], MD  HISTORY OF PRESENT ILLNESS:  The patient is a 87 y.o. female with a history of iron deficiency anemia.  She last received IV iron in December 2021 to improve her iron stores and hemoglobin.  She comes in today off-schedule as recent labs at urgent care showed her to be very anemic.  Furthermore, her labs were suggestive of her having recurrent iron deficiency anemia.   She definitely admits to having increased fatigue, but denies having any overt forms of blood loss.  PHYSICAL EXAM:  Blood pressure (!) 173/72, pulse 76, temperature 98 F (36.7 C), resp. rate 16, height '5\' 1"'$  (1.549 m), weight 154 lb (69.9 kg), SpO2 100 %. Wt Readings from Last 3 Encounters:  10/26/22 154 lb (69.9 kg)  05/05/22 152 lb 3.2 oz (69 kg)  04/01/22 153 lb (69.4 kg)   Body mass index is 29.1 kg/m. Performance status (ECOG): 0 - Asymptomatic Physical Exam Constitutional:      Appearance: Normal appearance.  HENT:     Mouth/Throat:     Pharynx: Oropharynx is clear. No oropharyngeal exudate.  Cardiovascular:     Rate and Rhythm: Normal rate and regular rhythm.     Heart sounds: No murmur heard.    No friction rub. No gallop.  Pulmonary:     Breath sounds: Normal breath sounds.  Abdominal:     General: Bowel sounds are normal. There is no distension.     Palpations: Abdomen is soft. There is no mass.     Tenderness: There is no abdominal tenderness.  Musculoskeletal:        General: No tenderness.     Cervical back: Normal range of motion and neck supple.     Right lower leg: No edema.     Left lower leg: No edema.  Lymphadenopathy:     Cervical: No cervical adenopathy.     Right cervical: No superficial, deep or posterior cervical adenopathy.    Left cervical: No superficial, deep or posterior cervical adenopathy.     Upper Body:      Right upper body: No supraclavicular or axillary adenopathy.     Left upper body: No supraclavicular or axillary adenopathy.     Lower Body: No right inguinal adenopathy. No left inguinal adenopathy.  Skin:    Coloration: Skin is not jaundiced.     Findings: No lesion or rash.  Neurological:     General: No focal deficit present.     Mental Status: She is alert and oriented to person, place, and time. Mental status is at baseline.  Psychiatric:        Mood and Affect: Mood normal.        Behavior: Behavior normal.        Thought Content: Thought content normal.        Judgment: Judgment normal.     LABS:    Latest Reference Range & Units 10/26/22 15:59  Iron 28 - 170 ug/dL 31  UIBC ug/dL 564  TIBC 250 - 450 ug/dL 595 (H)  Saturation Ratios 10.4 - 31.8 % 5 (L)  Ferritin 11 - 307 ng/mL 4 (L)  Folate >5.9 ng/mL >40.0  Vitamin B12 180 - 914 pg/mL 1,050 (H)  (H): Data is abnormally high (L): Data is abnormally low  ASSESSMENT & PLAN:  Assessment/Plan:  An 87 y.o. female whose  labs today clearly show recurrent iron deficiency anemia.  I will arrange for her to receive IV iron immediately.  I will also arrange for her to be seen by GI in the forthcoming weeks to determine what may be the source behind her recurrent iron deficiency.  Otherwise, I will see her back in 3 months to reassess her iron deficiency anemia.  The patient understands all the plans discussed today and is in agreement with them.    [name], MD

## 2022-10-27 ENCOUNTER — Telehealth: Payer: Self-pay | Admitting: Oncology

## 2022-10-27 NOTE — Telephone Encounter (Signed)
Patient has been scheduled. Aware of appt date and time   Scheduling Message Entered by Juanetta Beets on 10/26/2022 at  4:39 PM Priority: High <No visit type provided>  Department: [facility]  Provider:  Appointment Notes:  Please schedule pt for 2 doses of feraheme.  1st dose 1/4 or 1/5.  Scheduling Notes:

## 2022-10-28 MED FILL — Ferumoxytol Inj 510 MG/17ML (30 MG/ML) (Elemental Fe): INTRAVENOUS | Qty: 17 | Status: AC

## 2022-10-29 ENCOUNTER — Inpatient Hospital Stay: Payer: Medicare Other

## 2022-10-29 VITALS — BP 82/57 | HR 64 | Temp 97.7°F | Resp 18 | Ht 61.0 in | Wt 152.0 lb

## 2022-10-29 DIAGNOSIS — D509 Iron deficiency anemia, unspecified: Secondary | ICD-10-CM | POA: Diagnosis not present

## 2022-10-29 MED ORDER — SODIUM CHLORIDE 0.9 % IV SOLN
510.0000 mg | Freq: Once | INTRAVENOUS | Status: AC
  Administered 2022-10-29: 510 mg via INTRAVENOUS
  Filled 2022-10-29: qty 510

## 2022-10-29 MED ORDER — SODIUM CHLORIDE 0.9 % IV SOLN: Freq: Once | INTRAVENOUS | Status: AC

## 2022-10-29 NOTE — Patient Instructions (Signed)

## 2022-11-02 ENCOUNTER — Telehealth: Payer: Self-pay

## 2022-11-02 ENCOUNTER — Inpatient Hospital Stay: Payer: Medicare Other

## 2022-11-02 ENCOUNTER — Encounter: Payer: Self-pay | Admitting: Oncology

## 2022-11-02 ENCOUNTER — Other Ambulatory Visit: Payer: Self-pay | Admitting: Hematology and Oncology

## 2022-11-02 DIAGNOSIS — D509 Iron deficiency anemia, unspecified: Secondary | ICD-10-CM

## 2022-11-02 LAB — CBC WITH DIFFERENTIAL (CANCER CENTER ONLY)
Abs Immature Granulocytes: 0.1 10*3/uL — ABNORMAL HIGH (ref 0.00–0.07)
Basophils Absolute: 0.1 10*3/uL (ref 0.0–0.1)
Basophils Relative: 1 %
Eosinophils Absolute: 0.2 10*3/uL (ref 0.0–0.5)
Eosinophils Relative: 2 %
HCT: 31.9 % — ABNORMAL LOW (ref 36.0–46.0)
Hemoglobin: 8.9 g/dL — ABNORMAL LOW (ref 12.0–15.0)
Immature Granulocytes: 1 %
Lymphocytes Relative: 17 %
Lymphs Abs: 1.5 10*3/uL (ref 0.7–4.0)
MCH: 22.4 pg — ABNORMAL LOW (ref 26.0–34.0)
MCHC: 27.9 g/dL — ABNORMAL LOW (ref 30.0–36.0)
MCV: 80.2 fL (ref 80.0–100.0)
Monocytes Absolute: 0.8 10*3/uL (ref 0.1–1.0)
Monocytes Relative: 10 %
Neutro Abs: 6 10*3/uL (ref 1.7–7.7)
Neutrophils Relative %: 69 %
Platelet Count: 525 10*3/uL — ABNORMAL HIGH (ref 150–400)
RBC: 3.98 MIL/uL (ref 3.87–5.11)
RDW: 19.3 % — ABNORMAL HIGH (ref 11.5–15.5)
WBC Count: 8.6 10*3/uL (ref 4.0–10.5)
nRBC: 1.7 % — ABNORMAL HIGH (ref 0.0–0.2)

## 2022-11-02 LAB — SAMPLE TO BLOOD BANK

## 2022-11-02 LAB — PREPARE RBC (CROSSMATCH)

## 2022-11-02 MED FILL — Ferumoxytol Inj 510 MG/17ML (30 MG/ML) (Elemental Fe): INTRAVENOUS | Qty: 17 | Status: AC

## 2022-11-02 NOTE — Telephone Encounter (Signed)
TAMMY,CMA CALLED TO SET UP AN APPT FOR THIS PATIENT FOR A BLOOD TRANSFUSION ON 11/03/2022 AT 0845. TAMMY WILL CALL THE PATIENT REGARDING THE DATE AND TIME.

## 2022-11-03 ENCOUNTER — Inpatient Hospital Stay: Payer: Medicare Other

## 2022-11-03 VITALS — BP 144/54 | HR 63 | Temp 97.7°F | Resp 16 | Wt 152.0 lb

## 2022-11-03 DIAGNOSIS — D509 Iron deficiency anemia, unspecified: Secondary | ICD-10-CM

## 2022-11-03 MED ORDER — SODIUM CHLORIDE 0.9 % IV SOLN: Freq: Once | INTRAVENOUS | Status: AC

## 2022-11-03 MED ORDER — HEPARIN SOD (PORK) LOCK FLUSH 100 UNIT/ML IV SOLN: 500.0000 [IU] | Freq: Every day | INTRAVENOUS | Status: DC | PRN

## 2022-11-03 MED ORDER — ACETAMINOPHEN 325 MG PO TABS: 650.0000 mg | ORAL_TABLET | Freq: Once | ORAL | Status: DC

## 2022-11-03 MED ORDER — SODIUM CHLORIDE 0.9 % IV SOLN
510.0000 mg | Freq: Once | INTRAVENOUS | Status: AC
  Administered 2022-11-03: 510 mg via INTRAVENOUS
  Filled 2022-11-03: qty 510

## 2022-11-03 MED ORDER — SODIUM CHLORIDE 0.9% FLUSH: 10.0000 mL | INTRAVENOUS | Status: DC | PRN

## 2022-11-03 MED ORDER — SODIUM CHLORIDE 0.9% IV SOLUTION
250.0000 mL | Freq: Once | INTRAVENOUS | Status: AC
  Administered 2022-11-03: 250 mL via INTRAVENOUS

## 2022-11-03 MED ORDER — DIPHENHYDRAMINE HCL 25 MG PO CAPS: 25.0000 mg | ORAL_CAPSULE | Freq: Once | ORAL | Status: DC

## 2022-11-03 MED ORDER — SODIUM CHLORIDE 0.9% FLUSH: 3.0000 mL | INTRAVENOUS | Status: DC | PRN

## 2022-11-03 NOTE — Progress Notes (Signed)
Remote pacemaker transmission.   

## 2022-11-03 NOTE — Patient Instructions (Signed)
Blood Transfusion, Adult, Care After After a blood transfusion, it is common to have: Bruising and soreness at the IV site. A headache. Follow these instructions at home: Your doctor may give you more instructions. If you have problems, contact your doctor. Insertion site care     Follow instructions from your doctor about how to take care of your insertion site. This is where an IV tube was put into your vein. Make sure you: Wash your hands with soap and water for at least 20 seconds before and after you change your bandage. If you cannot use soap and water, use hand sanitizer. Change your bandage as told by your doctor. Check your insertion site every day for signs of infection. Check for: Redness, swelling, or pain. Bleeding from the site. Warmth. Pus or a bad smell. General instructions Take over-the-counter and prescription medicines only as told by your doctor. Rest as told by your doctor. Go back to your normal activities as told by your doctor. Keep all follow-up visits. You may need to have tests at certain times to check your blood. Contact a doctor if: You have itching or red, swollen areas of skin (hives). You have a fever or chills. You have pain in the head, back, or chest. You feel worried or nervous (anxious). You feel weak after doing your normal activities. You have any of these problems at the insertion site: Redness, swelling, warmth, or pain. Bleeding that does not stop with pressure. Pus or a bad smell. If you received your blood transfusion in an outpatient setting, you will be told whom to contact to report any reactions. Get help right away if: You have signs of a serious reaction. This may be coming from an allergy or the body's defense system (immune system). Signs include: Trouble breathing or shortness of breath. Swelling of the face or feeling warm (flushed). A widespread rash. Dark pee (urine) or blood in the pee. Fast heartbeat. These symptoms  may be an emergency. Get help right away. Call 911. Do not wait to see if the symptoms will go away. Do not drive yourself to the hospital. Summary Bruising and soreness at the IV site are common. Check your insertion site every day for signs of infection. Rest as told by your doctor. Go back to your normal activities as told by your doctor. Get help right away if you have signs of a serious reaction. This information is not intended to replace advice given to you by your health care provider. Make sure you discuss any questions you have with your health care provider. Document Revised: 01/08/2022 Document Reviewed: 01/08/2022 Elsevier Patient Education  2023 Elsevier Inc.  Ferumoxytol Injection What is this medication? FERUMOXYTOL (FER ue MOX i tol) treats low levels of iron in your body (iron deficiency anemia). Iron is a mineral that plays an important role in making red blood cells, which carry oxygen from your lungs to the rest of your body. This medicine may be used for other purposes; ask your health care provider or pharmacist if you have questions. COMMON BRAND NAME(S): Feraheme What should I tell my care team before I take this medication? They need to know if you have any of these conditions: Anemia not caused by low iron levels High levels of iron in the blood Magnetic resonance imaging (MRI) test scheduled An unusual or allergic reaction to iron, other medications, foods, dyes, or preservatives Pregnant or trying to get pregnant Breastfeeding How should I use this medication? This medication is   injected into a vein. It is given by your care team in a hospital or clinic setting. Talk to your care team the use of this medication in children. Special care may be needed. Overdosage: If you think you have taken too much of this medicine contact a poison control center or emergency room at once. NOTE: This medicine is only for you. Do not share this medicine with others. What if  I miss a dose? It is important not to miss your dose. Call your care team if you are unable to keep an appointment. What may interact with this medication? Other iron products This list may not describe all possible interactions. Give your health care provider a list of all the medicines, herbs, non-prescription drugs, or dietary supplements you use. Also tell them if you smoke, drink alcohol, or use illegal drugs. Some items may interact with your medicine. What should I watch for while using this medication? Visit your care team regularly. Tell your care team if your symptoms do not start to get better or if they get worse. You may need blood work done while you are taking this medication. You may need to follow a special diet. Talk to your care team. Foods that contain iron include: whole grains/cereals, dried fruits, beans, or peas, leafy green vegetables, and organ meats (liver, kidney). What side effects may I notice from receiving this medication? Side effects that you should report to your care team as soon as possible: Allergic reactions--skin rash, itching, hives, swelling of the face, lips, tongue, or throat Low blood pressure--dizziness, feeling faint or lightheaded, blurry vision Shortness of breath Side effects that usually do not require medical attention (report to your care team if they continue or are bothersome): Flushing Headache Joint pain Muscle pain Nausea Pain, redness, or irritation at injection site This list may not describe all possible side effects. Call your doctor for medical advice about side effects. You may report side effects to FDA at 1-800-FDA-1088. Where should I keep my medication? This medication is given in a hospital or clinic and will not be stored at home. NOTE: This sheet is a summary. It may not cover all possible information. If you have questions about this medicine, talk to your doctor, pharmacist, or health care provider.  2023 Elsevier/Gold  Standard (2021-03-04 00:00:00)   

## 2022-11-04 LAB — TYPE AND SCREEN
ABO/RH(D): O POS
Antibody Screen: NEGATIVE
Unit division: 0
Unit division: 0

## 2022-11-04 LAB — BPAM RBC
Blood Product Expiration Date: 202402042359
Blood Product Expiration Date: 202402042359
ISSUE DATE / TIME: 202401100804
ISSUE DATE / TIME: 202401100804
Unit Type and Rh: 5100
Unit Type and Rh: 5100

## 2022-11-11 ENCOUNTER — Ambulatory Visit (HOSPITAL_COMMUNITY)
Admission: RE | Admit: 2022-11-11 | Discharge: 2022-11-11 | Disposition: A | Discharge status: Home / Self Care | Payer: Medicare Other | Source: Ambulatory Visit | Attending: Cardiology | Admitting: Cardiology

## 2022-11-11 ENCOUNTER — Encounter (HOSPITAL_COMMUNITY): Payer: Self-pay | Admitting: Cardiology

## 2022-11-11 ENCOUNTER — Other Ambulatory Visit: Payer: Self-pay

## 2022-11-11 VITALS — BP 138/70 | HR 74 | Wt 154.8 lb

## 2022-11-11 DIAGNOSIS — I428 Other cardiomyopathies: Secondary | ICD-10-CM | POA: Insufficient documentation

## 2022-11-11 DIAGNOSIS — I340 Nonrheumatic mitral (valve) insufficiency: Secondary | ICD-10-CM | POA: Insufficient documentation

## 2022-11-11 DIAGNOSIS — K760 Fatty (change of) liver, not elsewhere classified: Secondary | ICD-10-CM | POA: Insufficient documentation

## 2022-11-11 DIAGNOSIS — I447 Left bundle-branch block, unspecified: Secondary | ICD-10-CM | POA: Diagnosis not present

## 2022-11-11 DIAGNOSIS — Z8249 Family history of ischemic heart disease and other diseases of the circulatory system: Secondary | ICD-10-CM | POA: Diagnosis not present

## 2022-11-11 DIAGNOSIS — Z87891 Personal history of nicotine dependence: Secondary | ICD-10-CM | POA: Diagnosis not present

## 2022-11-11 DIAGNOSIS — D509 Iron deficiency anemia, unspecified: Secondary | ICD-10-CM | POA: Diagnosis not present

## 2022-11-11 DIAGNOSIS — Z7982 Long term (current) use of aspirin: Secondary | ICD-10-CM | POA: Insufficient documentation

## 2022-11-11 DIAGNOSIS — I2510 Atherosclerotic heart disease of native coronary artery without angina pectoris: Secondary | ICD-10-CM | POA: Diagnosis not present

## 2022-11-11 DIAGNOSIS — Z950 Presence of cardiac pacemaker: Secondary | ICD-10-CM | POA: Insufficient documentation

## 2022-11-11 DIAGNOSIS — Z79899 Other long term (current) drug therapy: Secondary | ICD-10-CM | POA: Insufficient documentation

## 2022-11-11 DIAGNOSIS — N183 Chronic kidney disease, stage 3 unspecified: Secondary | ICD-10-CM | POA: Insufficient documentation

## 2022-11-11 DIAGNOSIS — I5022 Chronic systolic (congestive) heart failure: Secondary | ICD-10-CM | POA: Diagnosis not present

## 2022-11-11 LAB — CBC
HCT: 42 % (ref 36.0–46.0)
Hemoglobin: 13.3 g/dL (ref 12.0–15.0)
MCH: 26.6 pg (ref 26.0–34.0)
MCHC: 31.7 g/dL (ref 30.0–36.0)
MCV: 84 fL (ref 80.0–100.0)
Platelets: 274 10*3/uL (ref 150–400)
RBC: 5 MIL/uL (ref 3.87–5.11)
RDW: 24.5 % — ABNORMAL HIGH (ref 11.5–15.5)
WBC: 6.7 10*3/uL (ref 4.0–10.5)
nRBC: 0 % (ref 0.0–0.2)

## 2022-11-11 LAB — BASIC METABOLIC PANEL
Anion gap: 10 (ref 5–15)
BUN: 24 mg/dL — ABNORMAL HIGH (ref 8–23)
CO2: 27 mmol/L (ref 22–32)
Calcium: 9.3 mg/dL (ref 8.9–10.3)
Chloride: 99 mmol/L (ref 98–111)
Creatinine, Ser: 1.12 mg/dL — ABNORMAL HIGH (ref 0.44–1.00)
GFR, Estimated: 48 mL/min — ABNORMAL LOW (ref >60)
Glucose, Bld: 100 mg/dL — ABNORMAL HIGH (ref 70–99)
Potassium: 3.6 mmol/L (ref 3.5–5.1)
Sodium: 136 mmol/L (ref 135–145)

## 2022-11-11 LAB — LIPID PANEL
Cholesterol: 128 mg/dL (ref 0–200)
HDL: 37 mg/dL — ABNORMAL LOW (ref >40)
LDL Cholesterol: 32 mg/dL (ref 0–99)
Total CHOL/HDL Ratio: 3.5 RATIO
Triglycerides: 295 mg/dL — ABNORMAL HIGH (ref <150)
VLDL: 59 mg/dL — ABNORMAL HIGH (ref 0–40)

## 2022-11-11 LAB — BRAIN NATRIURETIC PEPTIDE: B Natriuretic Peptide: 106.1 pg/mL — ABNORMAL HIGH (ref 0.0–100.0)

## 2022-11-11 NOTE — Progress Notes (Addendum)
PCP: [name], MD  Cardiology: [name], MD HF Cardiology: Dr. [name]  87 y.o. with history of chronic systolic CHF (nonischemic cardiomyopathy), chronic LBBB, and mitral regurgitation was referred by Dr. Burt Knack for CHF evaluation prior to Mitraclip placement.   CHF was first noted in 12/20, she was admitted to the hospital in [facility] at that time.  LHC showed nonobstructive CAD.  She later followed up with Dr. Geraldo Pitter and had echo in 3/21, showing EF 35-40% with concern for severe MR.  TEE was then done in 4/21 and I reviewed it today, showing EF 30-35% with septal-lateral dyssynchrony, moderate-severe functional MR, normal RV, severe TR.  She has had a LBBB on ECGs in 2021, she did not have LBBB in 2019.    Cardiac MRI in 6/21 showed moderate LV dilation, EF 27%, moderately decreased RV function with EF 31%, no LGE, probably moderate MR.   She had St Jude CRT-P device implanted in 7/21.  Echo in 9/21 showed EF up to 40% with normal RV, mild-moderate MR. Echo 3/23 showed EF 40-45%, mild LVH, mildly decreased RV systolic function, moderate MR.   Today she returns for HF follow up.  Hgb was low a few weeks ago and she had 2 units of PRBCs + Fe infusions.  Her breathing has improved considerably since then.  She has mild dyspnea with a long walk.  No chest pain.  No lightheadedness.  No orthopnea/PND.  Weight up 2 lbs.   ECG (personally reviewed): NSR, BiV-paced.   Labs (4/21): K 4.1, creatinine 1.16 Labs (5/21): K 3.8, creatinine 1.21 => 1.1, BNP 2424 => 2201, digoxin 0.4 Labs (6/21): K 4.6, creatinine 1.17, hgb 10.9 Labs (7/21): K 4.4, creatinine 1.11, digoxin 0.8 Labs (11/21): K 4.8, creatinine 1.22 Labs (3/22): K 4.5, creatinine 1.25, LDL 96, TGs 291 Labs (5/22): K 4.6, creatinine 1.2 Labs (6/22): LDL 104, LFTs normal Labs (12/22): K 5.2, creatinine 1.31 Labs (1/23): K 4.1, creatinine 1.08 Labs (3/23): K 3.5, creatinine 1.31, LDL 35, HDL 36 Labs (7/23): K 4.4, creatinine  1.12 Labs (1/24): hgb 8.9  PMH: 1. LBBB: Chronic.  2. Anemia: Prior GI workup in [place] was negative.  No history of overt GI bleeding. Fe deficiency.  3. Type 2 diabetes 4. Hyperlipidemia 5. CAD: LHC (2020) with 40% ostial left main, 50% mid RCA.  6. Chronic systolic CHF: Noted since 12/20.  Nonischemic cardiomyopathy.  - TEE (4/21): EF 30-35%, septal-lateral dyssynchrony, moderate-severe MR appears functional, normal RV size and systolic function, severe biatrial enlargement, severe TR.  - Cardiac MRI (6/21): Moderate LV dilation, EF 27%; moderately decreased RV function with EF 31%, no LGE, probably moderate MR.  - St Jude CRT-P implanted 7/21.  - Echo (9/21): EF 40%, diffuse hypokinesis, mildly decreased RV systolic function, mild-moderate MR, normal IVC.  - Echo (3/23): EF 40-45%, mild LVH, mildly decreased RV systolic function, moderate MR. 7. GERD 8. Hyperlipidemia 9. Hypothyroidism 10. Mitral regurgitation: Suspected to be functional.  Echo in 9/21 with improved MR, appears mild-moderate.  11. Fe deficiency anemia 12. Fatty liver 13. CKD stage 3  Social History   Socioeconomic History   Marital status: Widowed    Spouse name: Not on file   Number of children: Not on file   Years of education: Not on file   Highest education level: Not on file  Occupational History   Not on file  Tobacco Use   Smoking status: Former   Smokeless tobacco: Never  Vaping Use  Vaping Use: Never used  Substance and Sexual Activity   Alcohol use: No   Drug use: No   Sexual activity: Not on file  Other Topics Concern   Not on file  Social History Narrative   Not on file   Social Determinants of Health   Financial Resource Strain: Not on file  Food Insecurity: Not on file  Transportation Needs: Not on file  Physical Activity: Not on file  Stress: Not on file  Social Connections: Not on file  Intimate Partner Violence: Not on file   Family History  Problem Relation Age of  Onset   Diabetes Mother    Heart disease Mother    Hypertension Mother    Heart attack Mother    Emphysema Father    Hypertension Father    Heart attack Father    Breast cancer Sister    Diabetes Sister    Emphysema Sister    COPD Sister    Asthma Sister    Hypertension Sister    Lung cancer Brother    Diabetes Brother    Emphysema Brother    COPD Brother    Asthma Brother    Heart disease Brother    ROS: All systems reviewed and negative except as per HPI.   Current Outpatient Medications  Medication Sig Dispense Refill   albuterol (PROVENTIL) (2.5 MG/3ML) 0.083% nebulizer solution Take 2.5 mg by nebulization every 6 (six) hours as needed for wheezing or shortness of breath.      aspirin EC 81 MG tablet Take 1 tablet (81 mg total) by mouth daily. 30 tablet 11   b complex vitamins tablet Take 1 tablet by mouth daily.     Calcium Carb-Cholecalciferol (CALCIUM 600 + D PO) Take 2 tablets by mouth daily.     cetirizine (ZYRTEC) 10 MG tablet Take 10 mg by mouth daily.     cyclobenzaprine (FLEXERIL) 10 MG tablet Take 10 mg by mouth at bedtime as needed for muscle spasms.      famotidine (PEPCID) 40 MG tablet Take 40 mg by mouth daily.     fluticasone (FLONASE) 50 MCG/ACT nasal spray Place 2 sprays into both nostrils as needed for allergies or rhinitis.     furosemide (LASIX) 40 MG tablet Take 2 tablets (80 mg total) by mouth every morning AND 1 tablet (40 mg total) every evening. 90 tablet 3   JARDIANCE 10 MG TABS tablet TAKE 1 TABLET DAILY BEFORE BREAKFAST 90 tablet 3   levalbuterol (XOPENEX HFA) 45 MCG/ACT inhaler Inhale 2 puffs into the lungs every 4 (four) hours as needed for wheezing.     meclizine (ANTIVERT) 25 MG tablet Take 25 mg by mouth every 8 (eight) hours as needed for dizziness or nausea.      metFORMIN (GLUCOPHAGE) 500 MG tablet Take 250 mg by mouth 2 (two) times daily. 1/2 tablet bid     metoprolol succinate (TOPROL-XL) 100 MG 24 hr tablet TAKE 1 TABLET TWICE A DAY  (NOTE CHANGE IN DOSAGE) 135 tablet 4   montelukast (SINGULAIR) 10 MG tablet Take 10 mg by mouth at bedtime.      mupirocin ointment (BACTROBAN) 2 % Apply 1 application topically 2 (two) times daily as needed for rash (eczema).     naproxen sodium (ALEVE) 220 MG tablet Take 440 mg by mouth daily as needed for pain (pain).     nitroGLYCERIN (NITROSTAT) 0.4 MG SL tablet Place 1 tablet (0.4 mg total) under the tongue every 5 (five) minutes  as needed for chest pain. 30 tablet 2   pantoprazole (PROTONIX) 40 MG tablet Take 40 mg by mouth daily.     Polyethyl Glycol-Propyl Glycol (SYSTANE OP) Place 1 drop into both eyes 4 (four) times daily.     polyethylene glycol (MIRALAX / GLYCOLAX) packet Take 17 g by mouth daily as needed for mild constipation.      rosuvastatin (CRESTOR) 20 MG tablet TAKE 1 TABLET DAILY ( CHANGE IN DOSAGE ) 90 tablet 3   spironolactone (ALDACTONE) 25 MG tablet TAKE 1 TABLET AT BEDTIME (DOSE CHANGE) 90 tablet 3   vitamin E 400 UNIT capsule Take 400 Units by mouth daily.      Wheat Dextrin (BENEFIBER) POWD Take 1 Scoop by mouth daily as needed for constipation (constipation).     TIROSINT 75 MCG CAPS Take 1 capsule by mouth daily. (Patient not taking: Reported on 11/11/2022)     No current facility-administered medications for this encounter.   Wt Readings from Last 3 Encounters:  11/11/22 70.2 kg (154 lb 12.8 oz)  11/03/22 68.9 kg (152 lb)  10/29/22 68.9 kg (152 lb)   BP 138/70   Pulse 74   Wt 70.2 kg (154 lb 12.8 oz)   SpO2 98%   BMI 29.25 kg/m   General: NAD Neck: No JVD, no thyromegaly or thyroid nodule.  Lungs: Clear to auscultation bilaterally with normal respiratory effort. CV: Nondisplaced PMI.  Heart regular S1/S2, no S3/S4, no murmur.  No peripheral edema.  No carotid bruit.  Normal pedal pulses.  Abdomen: Soft, nontender, no hepatosplenomegaly, no distention.  Skin: Intact without lesions or rashes.  Neurologic: Alert and oriented x 3.  Psych: Normal  affect. Extremities: No clubbing or cyanosis.  HEENT: Normal.    Assessment/Plan: 1. Chronic systolic CHF: Patient has a nonischemic cardiomyopathy of uncertain etiology.  She has significant mitral regurgitation.  This is functional, and I do not think that it explains her cardiomyopathy (though mitral regurgitation likely worsens symptoms).  She has a LBBB that is new since the prior ECG in 2019, cannot rule out LBBB cardiomyopathy.  Prior myocarditis is also a consideration.  TEE in 4/21 showed EF 30-35% with prominent septal-lateral dyssynchrony.  Cardiac MRI in 6/21 showed moderate LV dilation, EF 27%, moderately decreased RV function with EF 31%, no LGE, probably moderate MR.  St Jude CRT-P device implanted in 7/21.  Echo in 9/21 with EF up to 40%, improved MR (only mild-moderate). Echo 3/23 showed EF 40-45%, mild LVH, mildly decreased RV systolic function, moderate MR.  NYHA class II symptoms, she is not volume overloaded on exam.  - Continue Lasix 80 qam/40 qpm.  BMET/BNP today. - Unable to take Entresto or valsartan due to angioedema. Would not, therefore, try ACEI.  - Continue spironolactone 25 mg daily.  - Continue Toprol XL 100 mg bid.    - Continue Jardiance 10 mg daily.   - I will arrange for repeat echo at followup in 4 months.  2. CAD: Nonobstructive on 2020 cath.  No chest pain.  - Continue ASA 81  - Continue Crestor 20 daily, check lipids today. 3. Mitral regurgitation: She has functional mitral regurgitation, moderate-severe/3+ at least on TEE in 4/21.  However, after CRT, mitral regurgitation was mild-moderate on 9/21 echo and moderate on 3/23 echo.  - Repeat echo in 4 months.  4. Fatty liver: On Crestor.  5. CKD: Stage 3.  BMET today. - Avoid NSAIDs.   6. Fe deficiency anemia: Check CBC today.  She has had IV Fe recently.   Followup 4 months with echo  Loralie Champagne  11/11/2022

## 2022-11-11 NOTE — Patient Instructions (Signed)
There has been no changes to your medications.  Labs done today, your results will be available in MyChart, we will contact you for abnormal readings.  Your physician has requested that you have an echocardiogram. Echocardiography is a painless test that uses sound waves to create images of your heart. It provides your doctor with information about the size and shape of your heart and how well your heart's chambers and valves are working. This procedure takes approximately one hour. There are no restrictions for this procedure. Please do NOT wear cologne, perfume, aftershave, or lotions (deodorant is allowed). Please arrive 15 minutes prior to your appointment time.  Your physician recommends that you schedule a follow-up appointment in: 4 months (May 2024) with an echocardiogram. ** please call the office in March to arrange your follow up appointment **  If you have any questions or concerns before your next appointment please send [place] a message through [place] or call our office at [phone].    TO LEAVE A MESSAGE FOR THE NURSE SELECT OPTION 2, PLEASE LEAVE A MESSAGE INCLUDING: YOUR NAME DATE OF BIRTH CALL BACK NUMBER REASON FOR CALL**this is important as we prioritize the call backs  YOU WILL RECEIVE A CALL BACK THE SAME DAY AS LONG AS YOU CALL BEFORE 4:00 PM  At the [facility], you and your health needs are our priority. As part of our continuing mission to provide you with exceptional heart care, we have created designated Provider Care Teams. These Care Teams include your primary Cardiologist (physician) and Advanced Practice Providers (APPs- Physician Assistants and Nurse Practitioners) who all work together to provide you with the care you need, when you need it.   You may see any of the following providers on your designated Care Team at your next follow up: Dr Glori Bickers Dr Loralie Champagne Dr. Roxana Hires, NP Lyda Jester, [place] [facility], [place] Forestine Na, NP Audry Riles, PharmD   Please be sure to bring in all your medications bottles to every appointment.

## 2022-11-15 DIAGNOSIS — R5383 Other fatigue: Secondary | ICD-10-CM | POA: Diagnosis not present

## 2022-11-15 DIAGNOSIS — G4733 Obstructive sleep apnea (adult) (pediatric): Secondary | ICD-10-CM | POA: Diagnosis not present

## 2022-11-15 DIAGNOSIS — J4520 Mild intermittent asthma, uncomplicated: Secondary | ICD-10-CM | POA: Diagnosis not present

## 2022-11-15 DIAGNOSIS — Z8616 Personal history of COVID-19: Secondary | ICD-10-CM | POA: Diagnosis not present

## 2022-11-15 DIAGNOSIS — J310 Chronic rhinitis: Secondary | ICD-10-CM | POA: Diagnosis not present

## 2022-11-15 DIAGNOSIS — R0600 Dyspnea, unspecified: Secondary | ICD-10-CM | POA: Diagnosis not present

## 2022-11-17 ENCOUNTER — Telehealth: Payer: Self-pay | Admitting: Gastroenterology

## 2022-11-17 NOTE — Telephone Encounter (Signed)
Hi Dr. Lyndel Safe,   Patient called requesting a transfer of care over to you specifically, said her sister use to be a patient of yours. She has Gi history with Dr. Melina Copa and all her records were obtained for yo to review and advise on scheduling.   Thanks

## 2022-11-18 ENCOUNTER — Telehealth (HOSPITAL_COMMUNITY): Payer: Self-pay

## 2022-11-18 ENCOUNTER — Other Ambulatory Visit (HOSPITAL_COMMUNITY): Payer: Self-pay

## 2022-11-18 DIAGNOSIS — E559 Vitamin D deficiency, unspecified: Secondary | ICD-10-CM | POA: Diagnosis not present

## 2022-11-18 DIAGNOSIS — E039 Hypothyroidism, unspecified: Secondary | ICD-10-CM | POA: Diagnosis not present

## 2022-11-18 DIAGNOSIS — Z6829 Body mass index (BMI) 29.0-29.9, adult: Secondary | ICD-10-CM | POA: Diagnosis not present

## 2022-11-18 DIAGNOSIS — J45909 Unspecified asthma, uncomplicated: Secondary | ICD-10-CM | POA: Diagnosis not present

## 2022-11-18 DIAGNOSIS — R413 Other amnesia: Secondary | ICD-10-CM | POA: Diagnosis not present

## 2022-11-18 DIAGNOSIS — E1140 Type 2 diabetes mellitus with diabetic neuropathy, unspecified: Secondary | ICD-10-CM | POA: Diagnosis not present

## 2022-11-18 DIAGNOSIS — I10 Essential (primary) hypertension: Secondary | ICD-10-CM | POA: Diagnosis not present

## 2022-11-18 DIAGNOSIS — E785 Hyperlipidemia, unspecified: Secondary | ICD-10-CM | POA: Diagnosis not present

## 2022-11-18 MED ORDER — ICOSAPENT ETHYL 1 G PO CAPS: 2.0000 g | ORAL_CAPSULE | Freq: Two times a day (BID) | ORAL | 1 refills | Status: DC

## 2022-11-18 NOTE — Telephone Encounter (Signed)
Patient aware and agreeable. Prescription sent into pharmacy.

## 2022-11-24 ENCOUNTER — Telehealth (HOSPITAL_COMMUNITY): Payer: Self-pay

## 2022-11-24 NOTE — Telephone Encounter (Signed)
Recent office note and lab results faxed per request to PCP office .

## 2022-11-26 ENCOUNTER — Telehealth (HOSPITAL_COMMUNITY): Payer: Self-pay | Admitting: Cardiology

## 2022-11-26 NOTE — Telephone Encounter (Signed)
Pt reviewed medication insert for vascepa -insert reports to contact provider before starting if you have a history of liver dz, thyroid dz, or diabetes  Pt reports she has all three and wants to be sure this is the correct medication for her

## 2022-11-26 NOTE — Telephone Encounter (Signed)
Pt aware.

## 2022-11-29 ENCOUNTER — Other Ambulatory Visit (HOSPITAL_COMMUNITY): Payer: Self-pay | Admitting: Cardiology

## 2022-12-03 ENCOUNTER — Other Ambulatory Visit (HOSPITAL_COMMUNITY): Payer: Self-pay | Admitting: Cardiology

## 2022-12-18 DIAGNOSIS — R112 Nausea with vomiting, unspecified: Secondary | ICD-10-CM | POA: Diagnosis not present

## 2022-12-18 DIAGNOSIS — R197 Diarrhea, unspecified: Secondary | ICD-10-CM | POA: Diagnosis not present

## 2022-12-18 DIAGNOSIS — K591 Functional diarrhea: Secondary | ICD-10-CM | POA: Diagnosis not present

## 2022-12-19 DIAGNOSIS — R197 Diarrhea, unspecified: Secondary | ICD-10-CM | POA: Diagnosis not present

## 2022-12-24 ENCOUNTER — Encounter: Payer: Self-pay | Admitting: Nurse Practitioner

## 2022-12-24 ENCOUNTER — Ambulatory Visit (INDEPENDENT_AMBULATORY_CARE_PROVIDER_SITE_OTHER): Payer: Medicare Other | Admitting: Nurse Practitioner

## 2022-12-24 VITALS — BP 140/80 | HR 88 | Ht 61.0 in | Wt 155.0 lb

## 2022-12-24 DIAGNOSIS — K317 Polyp of stomach and duodenum: Secondary | ICD-10-CM | POA: Diagnosis not present

## 2022-12-24 DIAGNOSIS — D649 Anemia, unspecified: Secondary | ICD-10-CM | POA: Diagnosis not present

## 2022-12-24 DIAGNOSIS — I5022 Chronic systolic (congestive) heart failure: Secondary | ICD-10-CM | POA: Diagnosis not present

## 2022-12-24 NOTE — Progress Notes (Signed)
Assessment:   # 87 year old female with recurrent iron deficiency anemia.  This has been a recurrent problem for years.   Previously followed by GI Dr. Melina Copa.  Has also been followed by hematology and received iron infusions.  Recent decline in hemoglobin into the 7 range. Dark stools but otherwise no evidence for overt GI bleeding. She has recently gotten iron infusions and hemoglobin back up around 13 .  Hematology has referred her to [place] as Dr. Melina Copa has retired .  Dr. Lyndel Safe reviewed her records and accepted her as a patient.  She has a history of large friable gastric polyps, probably the source of iron deficiency anemia.  It appears that her last colonoscopy was back in 2016 at which time a small adenoma was removed  # Steatohepatitis.   # She did have a liver biopsy ordered by Dr. Melina Copa.  His note mentions that the results were that of steatohepatitis but I do not have the actual report hepatic serologic workup was negative for hepatitis B antigen and hepatitis B core antibody.  Hepatitis C antibody was negative.  ANA negative.  ASMA negative.  AMA negative.  # History of chronic CHF, results of echo 11/11/22 cannot be seen, "not released".  In 2021 her EF was 30 to 35%.    # History of severe MR  Plan:  -I suspect she is going to need an EGD given her history of large friable gastric polyps.  Hopefully further workup such as colonoscopy and small bowel video capsule will not be needed, especially given her comorbidities and age.  -I will need to talk with Dr. Lyndel Safe to see if he does not fact want to proceed with an EGD.  This may have to be done at the hospital depending on current status of heart failure. -For now, she has been given iron infusions and hemoglobin is in the 13 range.  -Continue PPI  HPI:   Chief Complaint: multiple issues.   Vanessa Johnson is an 87 y.o. year old female with multiple medical problems not limited to GERD , fatty liver disease, iron deficiency anemia,  chronic systolic heart failure with EF of 30 to 35%, pacemaker placement , severe MR , nonobstructive CAD, CKD stage 3,  DM2, hypothyroidism.  See PMH / [place] for additional history.   Patient is new to the practice.  Initially was unclear who referred her and the reason for the referral.  I had records from [facility] where patient was seen for diarrhea in late February.  Her stool study was positive for Campylobacter and she was treated with azithromycin.  C. difficile was negative.  During the course of our visit it later became clear that patient was actually referred by her Hematologist for evaluation of iron deficiency anemia.  Dr. Lyndel Safe had reviewed her records back in January and accepted her as a patient but I did not have those records in my hand until later in the course of our visit.  There is a packet of about 50 pages of records from patient's prior gastroenterologist Dr. Melina Copa.  Unfortunately I was unable to review all the records during this visit.    From what I can tell patient has a history of iron deficiency anemia in 2021.  She was followed by Dr. Melina Copa, GI in [place] as well as hematology.  I did see that she has had an upper endoscopy with removal  of some large gastric polyps which could have been the source  of anemia. She got 4 iron infusions in 2021. Anemia must have resolved as she did not require any further transfusions nor oral iron and her hemoglobin apparently remained stable until just recently when it was found to be in the 7 range with an,MCV of 70. She has since been back to hematology and is status post 2 iron infusions Hgb is now back into the normal range at 13.3 as of 85/18/2024  She has not had any overt GI bleeding except for minor rectal bleedng after several bouts of diarrhea during her Campylobacter infection. However, stools have been dark for several weeks. No bismuth or oral iron use. She doesn't take NSAIDs except for a baby asa.  She takes  pantoprazole 40 mg daily and pepcid as needed. She takes Stage manager everyday but still struggles with alternating constipation/ diarrhea.   Recent ultrasound (December 2023) showed mild hepatic steatosis.  No gallstones or gallbladder wall thickening.  No CBD duct dilation.  No focal liver lesions.  Previous GI history: Several records to review.  Will addend this note once everything has been reviewed from Dr. Carmie End office  April 2013 colonoscopy for evaluation of constipation -Good prep.  Exam was complete.  1 diminutive polyp was removed from the ascending colon.  Small external hemorrhoids Path-colon polyp was benign.  No adenomatous or malignant change.  July 2016 colonoscopy for iron deficiency. -Complete exam.  Bowel prep was good..  2 polyps ranging from 3 to 4 mm in size were removed.  No source of occult GI bleeding found.  Follow-up colonoscopy was not recommended. Path-tubular adenoma and hyperplastic polyp.  July 2016 EGD for iron deficiency and occult GI bleeding -Large gastric polyp, pedunculated measuring about 2 cm.  The surface was friable and there was oozing.  Multiple biopsies taken.  Antral gastritis.  Multiple antral polyps ranging 8 to 10 mm.  4 biopsies were taken in the antrum and body to evaluate for H. pylori.  Biopsies taken from the duodenum to evaluate for celiac. Path-duodenal biopsies negative.  Gastric biopsy showing chronic gastritis.  No H. pylori.  No intestinal metaplasia.  Large gastric polyp biopsy showed a hyperplastic gastric polyp with chronic inflammation and foci of acute ulceration.  No H. pylori.  April 2018 EGD -4 gastric polyps in the body of the stomach, 2 were about 1.5 cm and pedunculated.  Focal erythema with thickening of gastric folds in the antrum, biopsies taken. Path-gastric biopsies compatible with mild reactive gastropathy.  No H. pylori.  No intestinal metaplasia.  Gastric polyps were hyperplastic with ulceration and exudate.  June  2020 EGD for iron deficiency anemia. -Multiple large gastric polyps which were friable and possibly the source of chronic GI blood loss.  A 2 cm gastric polyp was removed.  A 1.5 cm polyp was removed.  A third polyp which was 1 cm was removed.  The descending duodenum was normal.  Biopsies taken to rule out celiac disease.  Gastric biopsies taken to rule out H. Pylori Path-polyps were hyperplastic.  Duodenal biopsies were negative.  Gastric biopsy showed chronic gastritis with erosions and focal intestinal metaplasia.  No H. pylori   Previous Labs / Imaging::    Latest Ref Rng & Units 11/11/2022   12:26 PM 11/02/2022    1:41 PM 10/26/2022   12:00 AM  CBC  WBC 4.0 - 10.5 K/uL 6.7  8.6  8.2      Hemoglobin 12.0 - 15.0 g/dL 13.3  8.9  7.7  Hematocrit 36.0 - 46.0 % 42.0  31.9  24      Platelets 150 - 400 K/uL 274  525  535         This result is from an external source.    No results found for: "LIPASE"    Latest Ref Rng & Units 11/11/2022   12:26 PM 05/05/2022    1:54 PM 01/19/2022    4:01 PM  CMP  Glucose 70 - 99 mg/dL 100  119  123   BUN 8 - 23 mg/dL '24  25  23   '$ Creatinine 0.44 - 1.00 mg/dL 1.12  1.12  1.31   Sodium 135 - 145 mmol/L 136  138  137   Potassium 3.5 - 5.1 mmol/L 3.6  4.4  3.5   Chloride 98 - 111 mmol/L 99  99  100   CO2 22 - 32 mmol/L '27  28  25   '$ Calcium 8.9 - 10.3 mg/dL 9.3  9.6  9.6      Past Medical History:  Diagnosis Date   Allergic rhinitis    Anemia 10/2022   Back pain    left lower back   Benign essential HTN    Biventricular cardiac pacemaker in situ 08/05/2020   BMI 34.0-34.9,adult    Bronchial asthma    Bronchitis 11/20/2018   CAD (coronary artery disease)    Heart cath 01/2017 [facility]   Cardiology follow-up encounter 01/26/2017   Overview:  Added automatically from request for surgery [id number]  Formatting of this note might be different from the original. Added automatically from request for surgery [id number]   CHF (congestive heart failure)  ([place])    Cholelithiasis with cholecystitis without obstruction 12/04/2015   Diabetes mellitus ([place])    Diabetes mellitus due to underlying condition with complication, without long-term current use of insulin ([place]) 01/15/2016   Formatting of this note might be different from the original. Added automatically from request for surgery [id number]   Diabetes mellitus without complication ([place]) [id number]   Dysfunction of right eustachian tube 11/20/2018   Essential hypertension 05/17/2017   Gastritis    Dr. Orlena Sheldon- EGD 2018   GERD (gastroesophageal reflux disease)    Hyperlipidemia    Hypothyroid    Iron deficiency anemia 09/02/2020   Ischiogluteal bursitis of left side    Left bundle branch block 04/02/2020   Mixed dyslipidemia 05/17/2017   Nonischemic cardiomyopathy ([place]) 04/02/2020   Obstructive sleep apnea of adult 12/04/2015   Pre-ulcerative corn or callous    Sleep apnea    On CPAP   Tinea pedis of right foot 01/15/2016   Unsteadiness 08/16/2018   Last Assessment & Plan:  Emergency department follow-up for evaluation of vertigo. Acute onset of vertigo 08/12/2018.  CT head at that time was okay.  Vertiginous symptoms resolved that day.  She persists with some unsteadiness but it is generally improving.  She has chronic history of unsteady that generally is helped by meclizine.  Denies any hearing loss symptoms or ringing in the ears. EXAM sh   Vitamin D deficiency    Past Surgical History:  Procedure Laterality Date   BIV PACEMAKER INSERTION CRT-P N/A 04/24/2020   Procedure: BIV PACEMAKER INSERTION CRT-P;  Surgeon: Evans Lance, MD;  Location: [facility];  Service: Cardiovascular;  Laterality: N/A;   BREAST BIOPSY  1970, 1980, and 2000   CATARACT EXTRACTION, BILATERAL  2016   CHOLECYSTECTOMY  2017   [place]  [place]   LEFT HEART CATH AND CORONARY ANGIOGRAPHY  01/2017   [facility]- No  intervention   TEE WITHOUT CARDIOVERSION N/A 02/21/2020   Procedure: TRANSESOPHAGEAL ECHOCARDIOGRAM (TEE);  Surgeon: Dorothy Spark, MD;  Location: [facility];  Service: Cardiovascular;  Laterality: N/A;   [place] Bilateral    left- 2005 and right- 2010   Family History  Problem Relation Age of Onset   Diabetes Mother    Heart disease Mother    Hypertension Mother    Heart attack Mother    Emphysema Father    Hypertension Father    Heart attack Father    Breast cancer Sister    Diabetes Sister    Emphysema Sister    COPD Sister    Asthma Sister    Hypertension Sister    Lung cancer Brother    Diabetes Brother    Emphysema Brother    COPD Brother    Asthma Brother    Heart disease Brother    Liver disease Neg Hx    Social History   Tobacco Use   Smoking status: Former   Smokeless tobacco: Never  Scientific laboratory technician Use: Never used  Substance Use Topics   Alcohol use: No   Drug use: No   Current Outpatient Medications  Medication Sig Dispense Refill   albuterol (PROVENTIL) (2.5 MG/3ML) 0.083% nebulizer solution Take 2.5 mg by nebulization every 6 (six) hours as needed for wheezing or shortness of breath.      aspirin EC 81 MG tablet Take 1 tablet (81 mg total) by mouth daily. 30 tablet 11   b complex vitamins tablet Take 1 tablet by mouth daily.     Calcium Carb-Cholecalciferol (CALCIUM 600 + D PO) Take 2 tablets by mouth daily.     cetirizine (ZYRTEC) 10 MG tablet Take 10 mg by mouth daily.     cyclobenzaprine (FLEXERIL) 10 MG tablet Take 10 mg by mouth at bedtime as needed for muscle spasms.      famotidine (PEPCID) 40 MG tablet Take 40 mg by mouth daily.     fluticasone (FLONASE) 50 MCG/ACT nasal spray Place 2 sprays into both nostrils as needed for allergies or rhinitis.     furosemide (LASIX) 40 MG tablet TAKE 2 TABLETS EVERY MORNING (CHANGE IN DOSAGE OR PILL SIZE) 315 tablet 3   icosapent Ethyl (VASCEPA) 1 g capsule  Take 2 capsules (2 g total) by mouth 2 (two) times daily. 120 capsule 1   JARDIANCE 10 MG TABS tablet TAKE 1 TABLET DAILY BEFORE BREAKFAST 90 tablet 3   levalbuterol (XOPENEX HFA) 45 MCG/ACT inhaler Inhale 2 puffs into the lungs every 4 (four) hours as needed for wheezing.     meclizine (ANTIVERT) 25 MG tablet Take 25 mg by mouth every 8 (eight) hours as needed for dizziness or nausea.      metFORMIN (GLUCOPHAGE) 500 MG tablet Take 250 mg by mouth 2 (two) times daily. 1/2 tablet bid     metoprolol succinate (TOPROL-XL) 100 MG 24 hr tablet TAKE 1 TABLET TWICE A DAY (NOTE CHANGE IN DOSAGE) 135 tablet 4   montelukast (SINGULAIR) 10 MG tablet Take 10 mg by mouth at bedtime.      mupirocin ointment (BACTROBAN) 2 % Apply 1 application topically 2 (two) times daily as needed for rash (eczema).     naproxen sodium (ALEVE) 220 MG tablet Take 440 mg by  mouth daily as needed for pain (pain).     nitroGLYCERIN (NITROSTAT) 0.4 MG SL tablet Place 1 tablet (0.4 mg total) under the tongue every 5 (five) minutes as needed for chest pain. 30 tablet 2   pantoprazole (PROTONIX) 40 MG tablet Take 40 mg by mouth daily.     Polyethyl Glycol-Propyl Glycol (SYSTANE OP) Place 1 drop into both eyes 4 (four) times daily.     polyethylene glycol (MIRALAX / GLYCOLAX) packet Take 17 g by mouth daily as needed for mild constipation.      rosuvastatin (CRESTOR) 20 MG tablet TAKE 1 TABLET DAILY ( CHANGE IN DOSAGE ) 90 tablet 3   spironolactone (ALDACTONE) 25 MG tablet TAKE 1 TABLET AT BEDTIME (DOSE CHANGE) 90 tablet 3   TIROSINT 75 MCG CAPS Take 1 capsule by mouth daily.     vitamin E 400 UNIT capsule Take 400 Units by mouth daily.      Wheat Dextrin (BENEFIBER) POWD Take 1 Scoop by mouth daily as needed for constipation (constipation).     No current facility-administered medications for this visit.   Allergies  Allergen Reactions   Ace Inhibitors Anaphylaxis   Angiotensin Receptor Blockers Anaphylaxis   Entresto  [Sacubitril-Valsartan] Anaphylaxis   Tussionex Pennkinetic Er [Hydrocod Poli-Chlorphe Poli Er] Swelling and Rash    Throat swelling   Valsartan Swelling   Cephalexin Swelling    Throat swelling    Codeine Other (See Comments)    Unknown    Sulfamethoxazole Other (See Comments)    Unknown   Cefuroxime Axetil Rash   Cephalosporins Rash   Iohexol Rash   Moxifloxacin Rash   Review of Systems: All systems reviewed and negative except where noted in HPI.   Wt Readings from Last 3 Encounters:  12/24/22 155 lb (70.3 kg)  11/11/22 154 lb 12.8 oz (70.2 kg)  11/03/22 152 lb (68.9 kg)    Physical Exam:  BP (!) 140/80   Pulse 88   Ht '5\' 1"'$  (1.549 m)   Wt 155 lb (70.3 kg)   SpO2 98%   BMI 29.29 kg/m  Constitutional:  Pleasant, generally well appearing female in no acute distress. Psychiatric:  Normal mood and affect. Behavior is normal. EENT: Pupils normal.  Conjunctivae are normal. No scleral icterus. Neck supple.  Cardiovascular: Normal rate, regular rhythm.  Pulmonary/chest: Effort normal and breath sounds normal. No wheezing, rales or rhonchi. Abdominal: Soft, nondistended, nontender. Bowel sounds active throughout. There are no masses palpable. No hepatomegaly. Neurological: Alert and oriented to person place and time. Skin: Skin is warm and dry. No rashes noted.  Tye Savoy, NP  12/24/2022, 11:21 AM  I spent 55 minutes total reviewing extensive records, obtaining history, performing exam, counseling patient and documenting visit / findings.   Cc:  Referring Provider Lavera Guise, MD

## 2022-12-24 NOTE — Patient Instructions (Signed)
We will contact you once we reviewed all records.  If your blood pressure at your visit was 140/90 or greater, please contact your primary care physician to follow up on this.  _______________________________________________________  If you are age 85 or older, your body mass index should be between 23-30. Your Body mass index is 29.29 kg/m. If this is out of the aforementioned range listed, please consider follow up with your Primary Care Provider.  If you are age 44 or younger, your body mass index should be between 19-25. Your Body mass index is 29.29 kg/m. If this is out of the aformentioned range listed, please consider follow up with your Primary Care Provider.   ________________________________________________________  The [facility] providers would like to encourage you to use [facility] to communicate with providers for non-urgent requests or questions.  Due to long hold times on the telephone, sending your provider a message by [facility] may be a faster and more efficient way to get a response.  Please allow 48 business hours for a response.  Please remember that this is for non-urgent requests.  _______________________________________________________   Thank you for entrusting me with your care and choosing [facility].  Tye Savoy NP

## 2023-01-01 NOTE — Progress Notes (Signed)
Agree with assessment/plan.  Raj Kyler Germer, MD [facility] [phone]  

## 2023-01-15 DIAGNOSIS — L259 Unspecified contact dermatitis, unspecified cause: Secondary | ICD-10-CM | POA: Diagnosis not present

## 2023-01-17 ENCOUNTER — Other Ambulatory Visit: Payer: Self-pay

## 2023-01-17 ENCOUNTER — Telehealth: Payer: Self-pay

## 2023-01-17 DIAGNOSIS — K317 Polyp of stomach and duodenum: Secondary | ICD-10-CM

## 2023-01-17 DIAGNOSIS — D649 Anemia, unspecified: Secondary | ICD-10-CM

## 2023-01-17 NOTE — Telephone Encounter (Signed)
Patient is returning your call.  

## 2023-01-17 NOTE — Telephone Encounter (Signed)
-----   Message from Willia Craze, NP sent at 01/17/2023 12:30 PM EDT ----- Regarding: FW: EGD with polypectomy at [facility],  Would you please arrange for EGD at [facility] with Lyndel Safe if patient is willing? Diagnosis is recurrent anemia, history of gastric polyps. Also he would like the below labs the day prior to EGD. Thanks   ----- Message ----- From: Jackquline Denmark, MD Sent: 01/16/2023  11:26 AM EDT To: Willia Craze, NP; Gillermina Hu, RN Subject: EGD with polypectomy at [facility]. Somehow missed this staff message  Agreed-Ms. Zanardi would need EGD at [facility] with possible gastric polypectomy If you could please set her up with me. Also check CBC, CMP, PT/INR same day, prior to EGD.  RG   ----- Message ----- From: Willia Craze, NP Sent: 12/24/2022   5:29 PM EDT To: Jackquline Denmark, MD  Hi, please review my office note and let me know what you want to do for this patient.  Thanks

## 2023-01-17 NOTE — Telephone Encounter (Signed)
Called to discuss with the patient. No answer. Left a message to call me back to discuss.

## 2023-01-17 NOTE — Telephone Encounter (Signed)
Spoke with the patient. She agrees to the have the EGD. She states she has had them before and she is comfortable with moving forward. Scheduled her EGD 04/18/23. Information will be mailed to her.

## 2023-01-20 ENCOUNTER — Ambulatory Visit (INDEPENDENT_AMBULATORY_CARE_PROVIDER_SITE_OTHER): Payer: Medicare Other

## 2023-01-20 DIAGNOSIS — I428 Other cardiomyopathies: Secondary | ICD-10-CM | POA: Diagnosis not present

## 2023-01-20 LAB — CUP PACEART REMOTE DEVICE CHECK
Battery Remaining Longevity: 59 mo
Battery Remaining Percentage: 66 %
Battery Voltage: 2.98 V
Brady Statistic AP VP Percent: 1 %
Brady Statistic AP VS Percent: 1 %
Brady Statistic AS VP Percent: 99 %
Brady Statistic AS VS Percent: 1 %
Brady Statistic RA Percent Paced: 1 %
Date Time Interrogation Session: 20240328020009
Implantable Lead Connection Status: 753985
Implantable Lead Connection Status: 753985
Implantable Lead Connection Status: 753985
Implantable Lead Implant Date: 20210701
Implantable Lead Implant Date: 20210701
Implantable Lead Implant Date: 20210701
Implantable Lead Location: 753858
Implantable Lead Location: 753859
Implantable Lead Location: 753860
Implantable Pulse Generator Implant Date: 20210701
Lead Channel Impedance Value: 430 Ohm
Lead Channel Impedance Value: 510 Ohm
Lead Channel Impedance Value: 800 Ohm
Lead Channel Pacing Threshold Amplitude: 0.75 V
Lead Channel Pacing Threshold Amplitude: 0.75 V
Lead Channel Pacing Threshold Amplitude: 0.75 V
Lead Channel Pacing Threshold Pulse Width: 0.5 ms
Lead Channel Pacing Threshold Pulse Width: 0.5 ms
Lead Channel Pacing Threshold Pulse Width: 0.5 ms
Lead Channel Sensing Intrinsic Amplitude: 12 mV
Lead Channel Sensing Intrinsic Amplitude: 3.2 mV
Lead Channel Setting Pacing Amplitude: 2 V
Lead Channel Setting Pacing Amplitude: 2.5 V
Lead Channel Setting Pacing Amplitude: 2.5 V
Lead Channel Setting Pacing Pulse Width: 0.5 ms
Lead Channel Setting Pacing Pulse Width: 0.5 ms
Lead Channel Setting Sensing Sensitivity: 2 mV
Pulse Gen Model: 3562
Pulse Gen Serial Number: 3818336

## 2023-01-25 ENCOUNTER — Other Ambulatory Visit: Payer: Self-pay | Admitting: Oncology

## 2023-01-25 ENCOUNTER — Inpatient Hospital Stay: Payer: Medicare Other | Attending: Oncology | Admitting: Oncology

## 2023-01-25 ENCOUNTER — Inpatient Hospital Stay: Payer: Medicare Other

## 2023-01-25 VITALS — BP 175/74 | HR 75 | Temp 98.6°F | Resp 16 | Ht 61.0 in | Wt 154.8 lb

## 2023-01-25 DIAGNOSIS — D508 Other iron deficiency anemias: Secondary | ICD-10-CM

## 2023-01-25 DIAGNOSIS — D509 Iron deficiency anemia, unspecified: Secondary | ICD-10-CM

## 2023-01-25 DIAGNOSIS — D649 Anemia, unspecified: Secondary | ICD-10-CM | POA: Diagnosis not present

## 2023-01-25 LAB — CBC AND DIFFERENTIAL
HCT: 41 (ref 36–46)
Hemoglobin: 13.6 (ref 12.0–16.0)
Neutrophils Absolute: 5.37
Platelets: 374 10*3/uL (ref 150–400)
WBC: 8.8

## 2023-01-25 LAB — CBC: RBC: 4.8 (ref 3.87–5.11)

## 2023-01-25 NOTE — Progress Notes (Signed)
Vanessa Johnson  [address] [place],  [place]  [address] [phone]  Clinic Day:  01/25/2023  Referring physician: [name], MD  HISTORY OF PRESENT ILLNESS:  The patient is a 87 y.o. female with a history of iron deficiency anemia.  She comes in today to reassess her iron and hemoglobin levels after receiving another course of IV iron in January 2024.  She definitely feels much better since receiving her IV iron.  The patient denies having any overt forms of blood loss since her last visit.  Of note, she is scheduled for a repeat GI workup in the forthcoming months.    PHYSICAL EXAM:  Blood pressure (!) 175/74, pulse 75, temperature 98.6 F (37 C), resp. rate 16, height 5\' 1"  (1.549 m), weight 154 lb 12.8 oz (70.2 kg), SpO2 97 %. Wt Readings from Last 3 Encounters:  01/25/23 154 lb 12.8 oz (70.2 kg)  12/24/22 155 lb (70.3 kg)  11/11/22 154 lb 12.8 oz (70.2 kg)   Body mass index is 29.25 kg/m. Performance status (ECOG): 0 - Asymptomatic Physical Exam Constitutional:      Appearance: Normal appearance.  HENT:     Mouth/Throat:     Pharynx: Oropharynx is clear. No oropharyngeal exudate.  Cardiovascular:     Rate and Rhythm: Normal rate and regular rhythm.     Heart sounds: No murmur heard.    No friction rub. No gallop.  Pulmonary:     Breath sounds: Normal breath sounds.  Abdominal:     General: Bowel sounds are normal. There is no distension.     Palpations: Abdomen is soft. There is no mass.     Tenderness: There is no abdominal tenderness.  Musculoskeletal:        General: No tenderness.     Cervical back: Normal range of motion and neck supple.     Right lower leg: No edema.     Left lower leg: No edema.  Lymphadenopathy:     Cervical: No cervical adenopathy.     Right cervical: No superficial, deep or posterior cervical adenopathy.    Left cervical: No superficial, deep or posterior cervical adenopathy.     Upper Body:      Right upper body: No supraclavicular or axillary adenopathy.     Left upper body: No supraclavicular or axillary adenopathy.     Lower Body: No right inguinal adenopathy. No left inguinal adenopathy.  Skin:    Coloration: Skin is not jaundiced.     Findings: No lesion or rash.  Neurological:     General: No focal deficit present.     Mental Status: She is alert and oriented to person, place, and time. Mental status is at baseline.  Psychiatric:        Mood and Affect: Mood normal.        Behavior: Behavior normal.        Thought Content: Thought content normal.        Judgment: Judgment normal.    LABS:    ASSESSMENT & PLAN:  Assessment/Plan:  An 87 y.o. female with iron deficiency anemia.  Overall, I am very pleased as her recent IV iron has improved her hemoglobin by nearly 6 g.  Understandably, the patient was pleased with her labs today.  She knows to keep her follow-up with GI in the forthcoming months to get her necessary workup.  Otherwise, I will see her back in 6 months for repeat clinical assessment.  The  patient understands all the plans discussed today and is in agreement with them.    Bristyn Kulesza Macarthur Critchley, MD

## 2023-02-11 DIAGNOSIS — H35371 Puckering of macula, right eye: Secondary | ICD-10-CM | POA: Diagnosis not present

## 2023-02-14 DIAGNOSIS — R0600 Dyspnea, unspecified: Secondary | ICD-10-CM | POA: Diagnosis not present

## 2023-02-14 DIAGNOSIS — R911 Solitary pulmonary nodule: Secondary | ICD-10-CM | POA: Diagnosis not present

## 2023-02-14 DIAGNOSIS — J310 Chronic rhinitis: Secondary | ICD-10-CM | POA: Diagnosis not present

## 2023-02-14 DIAGNOSIS — J4520 Mild intermittent asthma, uncomplicated: Secondary | ICD-10-CM | POA: Diagnosis not present

## 2023-02-14 DIAGNOSIS — G4733 Obstructive sleep apnea (adult) (pediatric): Secondary | ICD-10-CM | POA: Diagnosis not present

## 2023-02-22 DIAGNOSIS — R413 Other amnesia: Secondary | ICD-10-CM | POA: Diagnosis not present

## 2023-02-22 DIAGNOSIS — E1140 Type 2 diabetes mellitus with diabetic neuropathy, unspecified: Secondary | ICD-10-CM | POA: Diagnosis not present

## 2023-02-22 DIAGNOSIS — Z9181 History of falling: Secondary | ICD-10-CM | POA: Diagnosis not present

## 2023-02-22 DIAGNOSIS — Z6828 Body mass index (BMI) 28.0-28.9, adult: Secondary | ICD-10-CM | POA: Diagnosis not present

## 2023-02-22 DIAGNOSIS — Z139 Encounter for screening, unspecified: Secondary | ICD-10-CM | POA: Diagnosis not present

## 2023-02-22 DIAGNOSIS — I10 Essential (primary) hypertension: Secondary | ICD-10-CM | POA: Diagnosis not present

## 2023-02-22 DIAGNOSIS — E559 Vitamin D deficiency, unspecified: Secondary | ICD-10-CM | POA: Diagnosis not present

## 2023-02-23 NOTE — Progress Notes (Signed)
Remote pacemaker transmission.   

## 2023-03-03 DIAGNOSIS — Z9181 History of falling: Secondary | ICD-10-CM | POA: Diagnosis not present

## 2023-03-03 DIAGNOSIS — Z0000 Encounter for general adult medical examination without abnormal findings: Secondary | ICD-10-CM | POA: Diagnosis not present

## 2023-04-07 ENCOUNTER — Telehealth: Payer: Self-pay | Admitting: Oncology

## 2023-04-07 NOTE — Telephone Encounter (Signed)
Patient has been scheduled. Aware of appt date and time  LABS / FU Received: Today Vanessa Johnson, CMA sent to [employer] Scheduling PLEASE CALL PATIENT AND MOVE UP APPT FROM OCT TO FIRST AVAILABLE.  NOT EMERGENT.  SHE HAS BEEN WEAK AND DIZZY.  WILL NEED LABS ONE DAY AND F/U THE NEXT.  THANKS

## 2023-04-08 ENCOUNTER — Encounter: Payer: Self-pay | Admitting: Oncology

## 2023-04-08 ENCOUNTER — Inpatient Hospital Stay: Payer: Medicare Other | Attending: Oncology

## 2023-04-08 DIAGNOSIS — D509 Iron deficiency anemia, unspecified: Secondary | ICD-10-CM | POA: Diagnosis not present

## 2023-04-08 LAB — FERRITIN: Ferritin: 4 ng/mL — ABNORMAL LOW (ref 11–307)

## 2023-04-08 LAB — CBC WITH DIFFERENTIAL (CANCER CENTER ONLY)
Abs Immature Granulocytes: 0.01 10*3/uL (ref 0.00–0.07)
Basophils Absolute: 0.1 10*3/uL (ref 0.0–0.1)
Basophils Relative: 1 %
Eosinophils Absolute: 0.2 10*3/uL (ref 0.0–0.5)
Eosinophils Relative: 4 %
HCT: 35.4 % — ABNORMAL LOW (ref 36.0–46.0)
Hemoglobin: 10.9 g/dL — ABNORMAL LOW (ref 12.0–15.0)
Immature Granulocytes: 0 %
Lymphocytes Relative: 28 %
Lymphs Abs: 1.5 10*3/uL (ref 0.7–4.0)
MCH: 24.5 pg — ABNORMAL LOW (ref 26.0–34.0)
MCHC: 30.8 g/dL (ref 30.0–36.0)
MCV: 79.6 fL — ABNORMAL LOW (ref 80.0–100.0)
Monocytes Absolute: 0.5 10*3/uL (ref 0.1–1.0)
Monocytes Relative: 9 %
Neutro Abs: 3.1 10*3/uL (ref 1.7–7.7)
Neutrophils Relative %: 58 %
Platelet Count: 428 10*3/uL — ABNORMAL HIGH (ref 150–400)
RBC: 4.45 MIL/uL (ref 3.87–5.11)
RDW: 14.3 % (ref 11.5–15.5)
WBC Count: 5.4 10*3/uL (ref 4.0–10.5)
nRBC: 0 % (ref 0.0–0.2)

## 2023-04-08 LAB — IRON AND TIBC
Iron: 34 ug/dL (ref 28–170)
Saturation Ratios: 6 % — ABNORMAL LOW (ref 10.4–31.8)
TIBC: 528 ug/dL — ABNORMAL HIGH (ref 250–450)
UIBC: 494 ug/dL

## 2023-04-11 ENCOUNTER — Encounter: Payer: Self-pay | Admitting: Oncology

## 2023-04-11 ENCOUNTER — Telehealth: Payer: Self-pay | Admitting: Gastroenterology

## 2023-04-11 ENCOUNTER — Inpatient Hospital Stay (INDEPENDENT_AMBULATORY_CARE_PROVIDER_SITE_OTHER): Payer: Medicare Other | Admitting: Oncology

## 2023-04-11 VITALS — BP 185/83 | HR 91 | Temp 98.4°F | Resp 16 | Ht 61.0 in | Wt 155.9 lb

## 2023-04-11 DIAGNOSIS — D500 Iron deficiency anemia secondary to blood loss (chronic): Secondary | ICD-10-CM | POA: Diagnosis not present

## 2023-04-11 NOTE — Progress Notes (Signed)
[facility]  [address] [place],  [place]  [address] [phone]  Clinic Day:  04/11/2023  Referring physician: Lucianne Lei, MD  HISTORY OF PRESENT ILLNESS:  The patient is a 87 y.o. female with a history of iron deficiency anemia.  She requested a visit today as she has gotten much weaker over the past few weeks.  The patient denies having any overt forms of blood loss since her last visit.  Of note, she is scheduled for an EGD next week  PHYSICAL EXAM:  Blood pressure (!) 185/83, pulse 91, temperature 98.4 F (36.9 C), resp. rate 16, height 5\' 1"  (1.549 m), weight 155 lb 14.4 oz (70.7 kg), SpO2 97 %. Wt Readings from Last 3 Encounters:  04/11/23 155 lb 14.4 oz (70.7 kg)  01/25/23 154 lb 12.8 oz (70.2 kg)  12/24/22 155 lb (70.3 kg)   Body mass index is 29.46 kg/m. Performance status (ECOG): 0 - Asymptomatic Physical Exam Constitutional:      Appearance: Normal appearance.  HENT:     Mouth/Throat:     Pharynx: Oropharynx is clear. No oropharyngeal exudate.  Cardiovascular:     Rate and Rhythm: Normal rate and regular rhythm.     Heart sounds: No murmur heard.    No friction rub. No gallop.  Pulmonary:     Breath sounds: Normal breath sounds.  Abdominal:     General: Bowel sounds are normal. There is no distension.     Palpations: Abdomen is soft. There is no mass.     Tenderness: There is no abdominal tenderness.  Musculoskeletal:        General: No tenderness.     Cervical back: Normal range of motion and neck supple.     Right lower leg: No edema.     Left lower leg: No edema.  Lymphadenopathy:     Cervical: No cervical adenopathy.     Right cervical: No superficial, deep or posterior cervical adenopathy.    Left cervical: No superficial, deep or posterior cervical adenopathy.     Upper Body:     Right upper body: No supraclavicular or axillary adenopathy.     Left upper body: No supraclavicular or axillary adenopathy.      Lower Body: No right inguinal adenopathy. No left inguinal adenopathy.  Skin:    Coloration: Skin is not jaundiced.     Findings: No lesion or rash.  Neurological:     General: No focal deficit present.     Mental Status: She is alert and oriented to person, place, and time. Mental status is at baseline.  Psychiatric:        Mood and Affect: Mood normal.        Behavior: Behavior normal.        Thought Content: Thought content normal.        Judgment: Judgment normal.    LABS:    Latest Reference Range & Units 04/08/23 11:01  WBC 4.0 - 10.5 K/uL 5.4  RBC 3.87 - 5.11 MIL/uL 4.45  Hemoglobin 12.0 - 15.0 g/dL 91.4 (L)  HCT 78.2 - 95.6 % 35.4 (L)  MCV 80.0 - 100.0 fL 79.6 (L)  MCH 26.0 - 34.0 pg 24.5 (L)  MCHC 30.0 - 36.0 g/dL 21.3  RDW 08.6 - 57.8 % 14.3  Platelets 150 - 400 K/uL 428 (H)  (L): Data is abnormally low (H): Data is abnormally high  Latest Reference Range & Units 04/08/23 11:01  Iron 28 - 170  ug/dL 34  UIBC ug/dL 829  TIBC 562 - 130 ug/dL 865 (H)  Saturation Ratios 10.4 - 31.8 % 6 (L)  Ferritin 11 - 307 ng/mL 4 (L)  (H): Data is abnormally high (L): Data is abnormally low  ASSESSMENT & PLAN:  Assessment/Plan:  An 87 y.o. female whose labs definitely show recurrent iron deficiency anemia.  Based upon this, I will arrange for her to receive another course of IV iron this week to rapidly replenish her iron stores and improve her hemoglobin.  As mentioned previously, she is scheduled for an EGD next week.  We will call her GI office to see if a colonoscopy can also be added to her GI workup next week.  Otherwise, I will see her back in 3 months to reassess her iron and hemoglobin levels to see how well she responded to her upcoming course of IV iron.  The patient understands all the plans discussed today and is in agreement with them.    Klayten Jolliff Kirby Funk, MD

## 2023-04-11 NOTE — Telephone Encounter (Signed)
Vanessa Cluster, NP indicated in her 12/24/22 office note:  "Plan:  -I suspect she is going to need an EGD given her history of large friable gastric polyps.  Hopefully further workup such as colonoscopy and small bowel video capsule will not be needed, especially given her comorbidities and age.  -I will need to talk with Dr. Chales Abrahams to see if he does not fact want to proceed with an EGD.  This may have to be done at the hospital depending on current status of heart failure."  However, I have spoken to Vanessa Johnson about the request for colonoscopy in addition to endoscopy. Vanessa Johnson and Dr Chales Abrahams to discuss and decide if colonoscopy would be beneficial on this patient.

## 2023-04-11 NOTE — Telephone Encounter (Signed)
PT is having an EGD on 6/24 and the [facility] in [place] wants to know if she can have a colonoscopy as well for her anemia. Please call Tammy @ [facility] [place], Dr. Lissa Hoard [phone]

## 2023-04-11 NOTE — Progress Notes (Addendum)
Anesthesia Review:  PCP NIna Uppin  Cardiologist :  Marca Ancona LOV 11/11/22  Pacemaker- Last check 01/20/23 - BiV   Device orders requested on 04/11/2023  Chest x-ray : EKG : 11/11/22  Echo : 03/21/22  Stress test: Cardiac Cath :  Activity level:  Sleep Study/ CPAP : has cpap  Fasting Blood Sugar :      / Checks Blood Sugar -- times a day:   Blood Thinner/ Instructions /Last Dose: ASA / Instructions/ Last Dose :      DM- type 2 - checks glucose once daily at home  Jardiance- is for heart per pt - hold 3 days prior to procedure - last dose on 04/14/23.  Metformin- none day of procedure    Reviewed med hx with pt and preprocedure instsructions  PT voiced iunderstanding.   PT has hearing aids and glasses.

## 2023-04-12 ENCOUNTER — Telehealth: Payer: Self-pay | Admitting: Oncology

## 2023-04-12 MED FILL — Ferumoxytol Inj 510 MG/17ML (30 MG/ML) (Elemental Fe): INTRAVENOUS | Qty: 17 | Status: AC

## 2023-04-12 NOTE — Telephone Encounter (Signed)
===  View-only below this line=== ----- Message ----- From: Meredith Pel, NP Sent: 04/11/2023   5:21 PM EDT To: Richardson Chiquito, CMA; Lynann Bologna, MD  Hi RG  Please see my office note on this patient.   This is a lady who transferred her care to you after Dr. Charm Barges retired.  I saw her in March.  She has a very long history of recurrent iron deficiency anemia which Dr. Charm Barges felt was related to  multiple large friable gastric polyps.  For recurrent anemia I had put her on for an EGD at the hospital which she is already scheduled for  Hematology is now asking about a colonoscopy for her.  Her last colonoscopy was in 2016 with removal of a small adenoma.   Given her age / comorbidities and known history of the friable gastric polyps I scheduled her only for an EGD.    Her last EGD with Dr. Charm Barges: June 2020 EGD for iron deficiency anemia. -Multiple large gastric polyps which were friable and possibly the source of chronic GI blood loss.  A 2 cm gastric polyp was removed.  A 1.5 cm polyp was removed.  A third polyp which was 1 cm was removed.  The descending duodenum was normal.  Biopsies taken to rule out celiac disease.  Gastric biopsies taken to rule out H. Pylori Path-polyps were hyperplastic.  Duodenal biopsies were negative.  Gastric biopsy showed chronic gastritis with erosions and focal intestinal metaplasia.    If you think colonoscopy is necessary I am happy to put her on for double but it will change the date.   Thanks

## 2023-04-12 NOTE — Telephone Encounter (Signed)
04/12/23 Spoke with patient and scheduled IV IRON and lab/DV.

## 2023-04-13 ENCOUNTER — Inpatient Hospital Stay: Payer: TRICARE For Life (TFL)

## 2023-04-13 VITALS — BP 153/55 | HR 74 | Temp 98.4°F | Resp 16 | Ht 61.0 in | Wt 156.1 lb

## 2023-04-13 DIAGNOSIS — D509 Iron deficiency anemia, unspecified: Secondary | ICD-10-CM

## 2023-04-13 MED ORDER — SODIUM CHLORIDE 0.9 % IV SOLN: Freq: Once | INTRAVENOUS | Status: AC

## 2023-04-13 MED ORDER — SODIUM CHLORIDE 0.9 % IV SOLN
510.0000 mg | Freq: Once | INTRAVENOUS | Status: AC
  Administered 2023-04-13: 510 mg via INTRAVENOUS
  Filled 2023-04-13: qty 510

## 2023-04-13 NOTE — Patient Instructions (Signed)

## 2023-04-14 ENCOUNTER — Encounter: Payer: Self-pay | Admitting: Oncology

## 2023-04-14 DIAGNOSIS — L578 Other skin changes due to chronic exposure to nonionizing radiation: Secondary | ICD-10-CM | POA: Diagnosis not present

## 2023-04-14 DIAGNOSIS — L570 Actinic keratosis: Secondary | ICD-10-CM | POA: Diagnosis not present

## 2023-04-14 DIAGNOSIS — L821 Other seborrheic keratosis: Secondary | ICD-10-CM | POA: Diagnosis not present

## 2023-04-15 NOTE — Telephone Encounter (Signed)
Dr Chales Abrahams-  Please see conversation below. Cardiology has sent a note that patient does not have clearance for EGD on Monday as she has not been checked in the office since 2021. Would you like me to reschedule at this time to a later date?

## 2023-04-15 NOTE — Telephone Encounter (Signed)
I have spoken to patient to advise that unfortunately, cardiology needs to clear her prior to endoscopy procedure. They are unable to see her until 04/21/23. Dr Urban Gibson next hospital availability is on 07/04/23 at 1055 am, 925 am arrival. Patient is advised of this change in appointment time/date.  Waynetta Sandy- Would you please place her on your hospital list in case we have a sooner availability than 07/04/23? She needs EGD for recurrent IDA; hx large gastric polyps.

## 2023-04-18 ENCOUNTER — Encounter: Payer: Self-pay | Admitting: Oncology

## 2023-04-18 MED FILL — Ferumoxytol Inj 510 MG/17ML (30 MG/ML) (Elemental Fe): INTRAVENOUS | Qty: 17 | Status: AC

## 2023-04-19 ENCOUNTER — Inpatient Hospital Stay: Payer: Medicare Other

## 2023-04-19 VITALS — BP 160/59 | HR 67 | Temp 97.9°F | Resp 18 | Wt 156.0 lb

## 2023-04-19 DIAGNOSIS — D509 Iron deficiency anemia, unspecified: Secondary | ICD-10-CM | POA: Diagnosis not present

## 2023-04-19 MED ORDER — SODIUM CHLORIDE 0.9 % IV SOLN: Freq: Once | INTRAVENOUS | Status: AC

## 2023-04-19 MED ORDER — SODIUM CHLORIDE 0.9 % IV SOLN
510.0000 mg | Freq: Once | INTRAVENOUS | Status: AC
  Administered 2023-04-19: 510 mg via INTRAVENOUS
  Filled 2023-04-19: qty 510

## 2023-04-19 NOTE — Telephone Encounter (Signed)
Pt added to Dr. Chales Abrahams hospital list in hopes of a sooner appointment.

## 2023-04-19 NOTE — Patient Instructions (Signed)

## 2023-04-20 DIAGNOSIS — Z1231 Encounter for screening mammogram for malignant neoplasm of breast: Secondary | ICD-10-CM | POA: Diagnosis not present

## 2023-04-21 ENCOUNTER — Ambulatory Visit: Payer: Medicare Other

## 2023-04-21 ENCOUNTER — Ambulatory Visit: Payer: Medicare Other | Attending: Internal Medicine | Admitting: Internal Medicine

## 2023-04-21 ENCOUNTER — Encounter: Payer: Self-pay | Admitting: Internal Medicine

## 2023-04-21 VITALS — BP 160/66 | HR 74 | Ht 61.0 in | Wt 157.0 lb

## 2023-04-21 DIAGNOSIS — I5022 Chronic systolic (congestive) heart failure: Secondary | ICD-10-CM | POA: Diagnosis not present

## 2023-04-21 DIAGNOSIS — I428 Other cardiomyopathies: Secondary | ICD-10-CM

## 2023-04-21 LAB — CUP PACEART REMOTE DEVICE CHECK
Battery Remaining Longevity: 58 mo
Battery Remaining Percentage: 63 %
Battery Voltage: 2.98 V
Brady Statistic AP VP Percent: 1 %
Brady Statistic AP VS Percent: 1 %
Brady Statistic AS VP Percent: 99 %
Brady Statistic AS VS Percent: 1 %
Brady Statistic RA Percent Paced: 1 %
Date Time Interrogation Session: 20240627020009
Implantable Lead Connection Status: 753985
Implantable Lead Connection Status: 753985
Implantable Lead Connection Status: 753985
Implantable Lead Implant Date: 20210701
Implantable Lead Implant Date: 20210701
Implantable Lead Implant Date: 20210701
Implantable Lead Location: 753858
Implantable Lead Location: 753859
Implantable Lead Location: 753860
Implantable Pulse Generator Implant Date: 20210701
Lead Channel Impedance Value: 410 Ohm
Lead Channel Impedance Value: 530 Ohm
Lead Channel Impedance Value: 940 Ohm
Lead Channel Pacing Threshold Amplitude: 0.75 V
Lead Channel Pacing Threshold Amplitude: 0.75 V
Lead Channel Pacing Threshold Amplitude: 0.75 V
Lead Channel Pacing Threshold Pulse Width: 0.5 ms
Lead Channel Pacing Threshold Pulse Width: 0.5 ms
Lead Channel Pacing Threshold Pulse Width: 0.5 ms
Lead Channel Sensing Intrinsic Amplitude: 12 mV
Lead Channel Sensing Intrinsic Amplitude: 4.3 mV
Lead Channel Setting Pacing Amplitude: 2 V
Lead Channel Setting Pacing Amplitude: 2.5 V
Lead Channel Setting Pacing Amplitude: 2.5 V
Lead Channel Setting Pacing Pulse Width: 0.5 ms
Lead Channel Setting Pacing Pulse Width: 0.5 ms
Lead Channel Setting Sensing Sensitivity: 2 mV
Pulse Gen Model: 3562
Pulse Gen Serial Number: 3818336

## 2023-04-21 NOTE — Progress Notes (Signed)
HPI Vanessa Johnson returns today for followup. She is a pleasant 87 yo woman with a non-ischemic CM, chronic systolic heart failure and LBBB who underwent biv PPM insertion 3 years ago. She has done well. Repeat echo showed that her EF increased from 25-30% to 40%. She has class 2A symptoms and no syncope or peripheral edema. No limitation to activity.  She is pending EGD/colonoscopy and presents in part for preop eval. She denies dyspnea with exertion or chest pain.  Allergies  Allergen Reactions   Ace Inhibitors Anaphylaxis   Angiotensin Receptor Blockers Anaphylaxis   Diovan [Valsartan] Swelling   Entresto [Sacubitril-Valsartan] Anaphylaxis   Tussionex Pennkinetic Er [Hydrocod Poli-Chlorphe Poli Er] Swelling and Rash    Throat swelling   Cephalexin Swelling    Throat swelling    Codeine Other (See Comments)    Unknown    Sulfamethoxazole Other (See Comments)    Unknown   Avelox [Moxifloxacin] Rash   Cefuroxime Axetil Rash   Cephalosporins Rash   Iohexol Rash     Current Outpatient Medications  Medication Sig Dispense Refill   albuterol (PROVENTIL) (2.5 MG/3ML) 0.083% nebulizer solution Take 2.5 mg by nebulization every 6 (six) hours as needed for wheezing or shortness of breath.      Apoaequorin (PREVAGEN) 10 MG CAPS Take 10 mg by mouth in the morning.     aspirin EC 81 MG tablet Take 1 tablet (81 mg total) by mouth daily. (Patient taking differently: Take 81 mg by mouth daily after supper.) 30 tablet 11   b complex vitamins tablet Take 1 tablet by mouth daily after lunch.     cetirizine (ZYRTEC) 10 MG tablet Take 10 mg by mouth in the morning.     Cholecalciferol (VITAMIN D-3 PO) Take 1 tablet by mouth daily after lunch.     cyclobenzaprine (FLEXERIL) 10 MG tablet Take 10 mg by mouth at bedtime as needed for muscle spasms.      famotidine (PEPCID) 40 MG tablet Take 40 mg by mouth daily as needed for heartburn.     fluticasone (FLONASE) 50 MCG/ACT nasal spray Place 2  sprays into both nostrils as needed for allergies or rhinitis.     furosemide (LASIX) 40 MG tablet TAKE 2 TABLETS EVERY MORNING (CHANGE IN DOSAGE OR PILL SIZE) (Patient taking differently: Take 40-80 mg by mouth See admin instructions. Take 2 tablets (80 mg) by mouth every morning & take 1 tablet (40 mg) by mouth in the evening.) 315 tablet 3   JARDIANCE 10 MG TABS tablet TAKE 1 TABLET DAILY BEFORE BREAKFAST 90 tablet 3   levalbuterol (XOPENEX HFA) 45 MCG/ACT inhaler Inhale 2 puffs into the lungs every 4 (four) hours as needed for wheezing.     meclizine (ANTIVERT) 25 MG tablet Take 25 mg by mouth every 8 (eight) hours as needed for dizziness or nausea.      metFORMIN (GLUCOPHAGE) 500 MG tablet Take 250 mg by mouth 2 (two) times daily.     metoprolol succinate (TOPROL-XL) 100 MG 24 hr tablet TAKE 1 TABLET TWICE A DAY (NOTE CHANGE IN DOSAGE) 135 tablet 4   montelukast (SINGULAIR) 10 MG tablet Take 10 mg by mouth at bedtime.      naproxen sodium (ALEVE) 220 MG tablet Take 440 mg by mouth daily as needed for pain (pain).     nitroGLYCERIN (NITROSTAT) 0.4 MG SL tablet Place 1 tablet (0.4 mg total) under the tongue every 5 (five) minutes as needed for chest  pain. 30 tablet 2   Omega-3 Fatty Acids (FISH OIL) 1200 MG CAPS Take 1,200 mg by mouth in the morning and at bedtime.     pantoprazole (PROTONIX) 40 MG tablet Take 40 mg by mouth daily before breakfast.     Polyethyl Glycol-Propyl Glycol (SYSTANE OP) Place 1 drop into both eyes 4 (four) times daily.     polyethylene glycol (MIRALAX / GLYCOLAX) packet Take 17 g by mouth in the morning.     rosuvastatin (CRESTOR) 20 MG tablet TAKE 1 TABLET DAILY ( CHANGE IN DOSAGE ) (Patient taking differently: Take 20 mg by mouth at bedtime.) 90 tablet 3   spironolactone (ALDACTONE) 25 MG tablet TAKE 1 TABLET AT BEDTIME (DOSE CHANGE) 90 tablet 3   TIROSINT 75 MCG CAPS Take 75 mcg by mouth daily before breakfast.     triamcinolone cream (KENALOG) 0.1 % Apply 1  Application topically daily as needed (skin irritation/eczema).     vitamin E 400 UNIT capsule Take 400 Units by mouth daily after lunch.     Wheat Dextrin (BENEFIBER) POWD Take 1 Scoop by mouth daily as needed for constipation (constipation/regularity).     No current facility-administered medications for this visit.     Past Medical History:  Diagnosis Date   Allergic rhinitis    Anemia 10/2022   Back pain    left lower back   Benign essential HTN    Biventricular cardiac pacemaker in situ 08/05/2020   BMI 34.0-34.9,adult    Bronchial asthma    Bronchitis 11/20/2018   CAD (coronary artery disease)    Heart [facility] 01/2017 [facility]   Cardiology follow-up encounter 01/26/2017   Overview:  Added automatically from request for surgery [id number]  Formatting of this note might be different from the original. Added automatically from request for surgery [id number]   CHF (congestive heart failure) (HCC)    Cholelithiasis with cholecystitis without obstruction 12/04/2015   Diabetes mellitus (HCC)    Diabetes mellitus due to underlying condition with complication, without long-term current use of insulin (HCC) 01/15/2016   Formatting of this note might be different from the original. Added automatically from request for surgery [id number]   Diabetes mellitus without complication (HCC) 01/15/2016   Dysfunction of right eustachian tube 11/20/2018   Essential hypertension 05/17/2017   Gastritis    Dr. Rayfield Citizen- EGD 2018   GERD (gastroesophageal reflux disease)    Hyperlipidemia    Hypothyroid    Iron deficiency anemia 09/02/2020   Ischiogluteal bursitis of left side    Left bundle branch block 04/02/2020   Mixed dyslipidemia 05/17/2017   Nonischemic cardiomyopathy (HCC) 04/02/2020   Obstructive sleep apnea of adult 12/04/2015   Pre-ulcerative corn or callous    Sleep apnea    On CPAP   Tinea pedis of right foot 01/15/2016   Unsteadiness 08/16/2018   Last Assessment & Plan:  Emergency  department follow-up for evaluation of vertigo. Acute onset of vertigo 08/12/2018.  CT head at that time was okay.  Vertiginous symptoms resolved that day.  She persists with some unsteadiness but it is generally improving.  She has chronic history of unsteady that generally is helped by meclizine.  Denies any hearing loss symptoms or ringing in the ears. EXAM sh   Vitamin D deficiency     ROS:   All systems reviewed and negative except as noted in the HPI.   Past Surgical History:  Procedure Laterality Date   BIV PACEMAKER INSERTION CRT-P N/A 04/24/2020   Procedure:  BIV PACEMAKER INSERTION CRT-P;  Surgeon: Marinus Maw, MD;  Location: [facility] INVASIVE CV LAB;  Service: Cardiovascular;  Laterality: N/A;   BREAST BIOPSY  1970, 1980, and 2000   CATARACT EXTRACTION, BILATERAL  2016   CHOLECYSTECTOMY  2017   DILATION AND CURETTAGE OF UTERUS  1996   HEMORRHOIDECTOMY WITH HEMORRHOID BANDING  1970   LEFT HEART CATH AND CORONARY ANGIOGRAPHY  01/2017   High [facility]- No intervention   TEE WITHOUT CARDIOVERSION N/A 02/21/2020   Procedure: TRANSESOPHAGEAL ECHOCARDIOGRAM (TEE);  Surgeon: Lars Masson, MD;  Location: [facility] ENDOSCOPY;  Service: Cardiovascular;  Laterality: N/A;   TONSILLECTOMY  1965   TOTAL HIP ARTHROPLASTY Bilateral    left- 2005 and right- 2010     Family History  Problem Relation Age of Onset   Diabetes Mother    Heart disease Mother    Hypertension Mother    Heart attack Mother    Emphysema Father    Hypertension Father    Heart attack Father    Breast cancer Sister    Diabetes Sister    Emphysema Sister    COPD Sister    Asthma Sister    Hypertension Sister    Lung cancer Brother    Diabetes Brother    Emphysema Brother    COPD Brother    Asthma Brother    Heart disease Brother    Liver disease Neg Hx      Social History   Socioeconomic History   Marital status: Widowed    Spouse name: Not on file   Number of children: 2   Years of  education: Not on file   Highest education level: Not on file  Occupational History   Occupation: retired  Tobacco Use   Smoking status: Former   Smokeless tobacco: Never  Building services engineer Use: Never used  Substance and Sexual Activity   Alcohol use: No   Drug use: No   Sexual activity: Not on file  Other Topics Concern   Not on file  Social History Narrative   Not on file   Social Determinants of Health   Financial Resource Strain: Not on file  Food Insecurity: Not on file  Transportation Needs: Not on file  Physical Activity: Not on file  Stress: Not on file  Social Connections: Not on file  Intimate Partner Violence: Not on file     BP (!) 160/66   Pulse 74   Ht 5\' 1"  (1.549 m)   Wt 157 lb (71.2 kg)   SpO2 97%   BMI 29.66 kg/m   Physical Exam:  Well appearing NAD HEENT: Unremarkable Neck:  No JVD, no thyromegally Lymphatics:  No adenopathy Back:  No CVA tenderness Lungs:  Clear HEART:  Regular rate rhythm, no murmurs, no rubs, no clicks Abd:  soft, positive bowel sounds, no organomegally, no rebound, no guarding Ext:  2 plus pulses, no edema, no cyanosis, no clubbing Skin:  No rashes no nodules Neuro:  CN II through XII intact, motor grossly intact  DEVICE  Normal device function.  See PaceArt for details.   Assess/Plan:  1.Chronic systolic heart failure - her EF is improved and her symptoms are class 2. Continue current meds. 2. PPM - her St. Jude biv PPM is working normally. We will recheck in several months. 3. Dyslipidemia - she will continue her statin therapy. 4. MR - she has had an improvement in her MR since her biv placement. I encouraged her  to maintain a low sodium diet. 5. Preop - she is an acceptable risk for upcoming EGD/colonoscopy.    Vanessa Gowda Andrey Mccaskill,MD

## 2023-04-21 NOTE — Progress Notes (Deleted)
PERIOPERATIVE PRESCRIPTION FOR IMPLANTED CARDIAC DEVICE PROGRAMMING  Patient Information: Name:  Vanessa Johnson  DOB:  1935/11/17  MRN:  [id number]  {TIP - You do not have to delete this tip  -  Copy the info from the staff message sent by the PAT staff  then press F2 here and paste the information using CTL - V on the next line :[id number]}  *** Device Information:  Clinic EP Physician:  {Press F2 for EP Physician:[id number]}   Device Type:  {Press F2 and Choose 1 (or both)  :[id number]} Manufacturer and Phone #:  {Press F2 to select Rep Info  :[id number]} Pacemaker Dependent?:  {yes no :314532} Date of Last Device Check:  *** Normal Device Function?:  {yes no   :314532}  Electrophysiologist's Recommendations:  Have magnet available. Provide continuous ECG monitoring when magnet is used or reprogramming is to be performed.  {Press F2, choose one   :[id number]}  Per Device Clinic Standing Orders, Skip Mayer, RN  3:59 PM 04/21/2023    After Your ICD (Implantable Cardiac Defibrillator)    Monitor your defibrillator site for redness, swelling, and drainage. Call the device clinic at [phone] if you experience these symptoms or fever/chills.  {Incision care:25794}  You may use a hot tub or a pool after your wound check appointment if the incision is completely closed.  Do not lift, push or pull greater than 10 pounds with the affected arm until 6 weeks after your procedure. There are no other restrictions in arm movement after your wound check appointment.  Your ICD {Is/is not:9024} MRI compatible.  Your ICD is designed to protect you from life threatening heart rhythms. Because of this, you may receive a shock.   1 shock with no symptoms:  Call the office during business hours. 1 shock with symptoms (chest pain, chest pressure, dizziness, lightheadedness, shortness of breath, overall feeling unwell):  Call 911. If you experience 2 or more shocks in 24 hours:  Call 911. If  you receive a shock, you should not drive.  [place] DMV - no driving for 6 months if you receive appropriate therapy from your ICD.   ICD Alerts:  Some alerts are vibratory and others beep. These are NOT emergencies. Please call our office to let us know. If this occurs at night or on weekends, it can wait until the next business day. Send a remote transmission.  If your device is capable of reading fluid status (for heart failure), you will be offered monthly monitoring to review this with you.   Remote monitoring is used to monitor your ICD from home. This monitoring is scheduled every 91 days by our office. It allows [place] to keep an eye on the functioning of your device to ensure it is working properly. You will routinely see your Electrophysiologist annually (more often if necessary).

## 2023-04-21 NOTE — Patient Instructions (Signed)
Medication Instructions:  Your physician recommends that you continue on your current medications as directed. Please refer to the Current Medication list given to you today.  *If you need a refill on your cardiac medications before your next appointment, please call your pharmacy*   Lab Work: None ordered   Testing/Procedures: None ordered   Follow-Up: At [facility], you and your health needs are our priority.  As part of our continuing mission to provide you with exceptional heart care, we have created designated Provider Care Teams.  These Care Teams include your primary Cardiologist (physician) and Advanced Practice Providers (APPs -  Physician Assistants and Nurse Practitioners) who all work together to provide you with the care you need, when you need it.  Remote monitoring is used to monitor your Pacemaker or ICD from home. This monitoring reduces the number of office visits required to check your device to one time per year. It allows [place] to keep an eye on the functioning of your device to ensure it is working properly. You are scheduled for a device check from home on 07/21/2023. You may send your transmission at any time that day. If you have a wireless device, the transmission will be sent automatically. After your physician reviews your transmission, you will receive a postcard with your next transmission date.  Your next appointment:   1 year(s)  The format for your next appointment:   In Person  Provider:   Lewayne Bunting, MD    Thank you for choosing [facility]!!   [phone]

## 2023-04-21 NOTE — Progress Notes (Signed)
PERIOPERATIVE PRESCRIPTION FOR IMPLANTED CARDIAC DEVICE PROGRAMMING  Patient Information: Name:  Vanessa Johnson  DOB:  1936-02-28  MRN:  [id number]    Planned Procedure:   EGD with  Propofol  Surgeon:  Dr  Chales Abrahams  Date of Procedure:  04/18/23  Cautery will be used.  Position during surgery:   Please send documentation back to:  [facility] (Fax # [phone])  Device Information:  Clinic EP Physician:  Lewayne Bunting, MD   Device Type:  Pacemaker Manufacturer and Phone #:  St. Jude/Abbott: [phone] Pacemaker Dependent?:  Unknown Date of Last Device Check:  04/21/2023 Normal Device Function?:  Yes.    Electrophysiologist's Recommendations:  Have magnet available. Provide continuous ECG monitoring when magnet is used or reprogramming is to be performed.  Procedure may interfere with device function.  Magnet should be placed over device during procedure.  Per Device Clinic Standing Orders, Skip Mayer, RN  4:04 PM 04/21/2023

## 2023-04-22 NOTE — Telephone Encounter (Signed)
Per Dr Olga Millers office note excerpt from 04/21/23:  "Preop - she is an acceptable risk for upcoming EGD/colonoscopy. "  Electrophysiology note from 04/21/23 states:  PERIOPERATIVE PRESCRIPTION FOR IMPLANTED CARDIAC DEVICE PROGRAMMING   Patient Information: Name:  Vanessa Johnson  DOB:  1936/05/12  MRN:  [id number]    Planned Procedure:   EGD with  Propofol  Surgeon:  Dr  Chales Abrahams  Date of Procedure:  04/18/23  Cautery will be used.  Position during surgery:   Please send documentation back to:  [facility] (Fax # [phone])  Device Information:   Clinic EP Physician:  Lewayne Bunting, MD    Device Type:  Pacemaker Manufacturer and Phone #:  St. Jude/Abbott: [phone] Pacemaker Dependent?:  Unknown Date of Last Device Check:  04/21/2023       Normal Device Function?:  Yes.     Electrophysiologist's Recommendations:   Have magnet available. Provide continuous ECG monitoring when magnet is used or reprogramming is to be performed.  Procedure may interfere with device function.  Magnet should be placed over device during procedure.   Per Device Clinic Standing Orders, Skip Mayer, RN  4:04 PM 04/21/2023

## 2023-05-02 ENCOUNTER — Other Ambulatory Visit: Payer: Self-pay

## 2023-05-09 ENCOUNTER — Ambulatory Visit: Payer: Medicare Other | Admitting: Cardiology

## 2023-05-10 NOTE — Progress Notes (Signed)
 Remote pacemaker transmission.   

## 2023-05-20 ENCOUNTER — Encounter: Payer: Self-pay | Admitting: Cardiology

## 2023-05-20 DIAGNOSIS — R0602 Shortness of breath: Secondary | ICD-10-CM | POA: Diagnosis not present

## 2023-05-20 DIAGNOSIS — I509 Heart failure, unspecified: Secondary | ICD-10-CM | POA: Diagnosis not present

## 2023-05-24 DIAGNOSIS — R600 Localized edema: Secondary | ICD-10-CM | POA: Diagnosis not present

## 2023-05-24 DIAGNOSIS — J209 Acute bronchitis, unspecified: Secondary | ICD-10-CM | POA: Diagnosis not present

## 2023-05-24 DIAGNOSIS — Z6829 Body mass index (BMI) 29.0-29.9, adult: Secondary | ICD-10-CM | POA: Diagnosis not present

## 2023-05-24 DIAGNOSIS — I10 Essential (primary) hypertension: Secondary | ICD-10-CM | POA: Diagnosis not present

## 2023-05-26 ENCOUNTER — Other Ambulatory Visit (HOSPITAL_COMMUNITY): Payer: Self-pay | Admitting: Cardiology

## 2023-05-27 ENCOUNTER — Ambulatory Visit (HOSPITAL_COMMUNITY)
Admission: RE | Admit: 2023-05-27 | Discharge: 2023-05-27 | Disposition: A | Discharge status: Home / Self Care | Payer: Medicare Other | Source: Ambulatory Visit | Attending: Cardiology | Admitting: Cardiology

## 2023-05-27 VITALS — BP 134/78 | HR 62 | Wt 161.4 lb

## 2023-05-27 DIAGNOSIS — I2510 Atherosclerotic heart disease of native coronary artery without angina pectoris: Secondary | ICD-10-CM | POA: Diagnosis not present

## 2023-05-27 DIAGNOSIS — I428 Other cardiomyopathies: Secondary | ICD-10-CM | POA: Insufficient documentation

## 2023-05-27 DIAGNOSIS — N183 Chronic kidney disease, stage 3 unspecified: Secondary | ICD-10-CM | POA: Diagnosis not present

## 2023-05-27 DIAGNOSIS — Z87891 Personal history of nicotine dependence: Secondary | ICD-10-CM | POA: Insufficient documentation

## 2023-05-27 DIAGNOSIS — I081 Rheumatic disorders of both mitral and tricuspid valves: Secondary | ICD-10-CM | POA: Insufficient documentation

## 2023-05-27 DIAGNOSIS — I5022 Chronic systolic (congestive) heart failure: Secondary | ICD-10-CM | POA: Diagnosis not present

## 2023-05-27 DIAGNOSIS — Z8249 Family history of ischemic heart disease and other diseases of the circulatory system: Secondary | ICD-10-CM | POA: Diagnosis not present

## 2023-05-27 DIAGNOSIS — K760 Fatty (change of) liver, not elsewhere classified: Secondary | ICD-10-CM | POA: Diagnosis not present

## 2023-05-27 DIAGNOSIS — Z7982 Long term (current) use of aspirin: Secondary | ICD-10-CM | POA: Diagnosis not present

## 2023-05-27 DIAGNOSIS — Z79899 Other long term (current) drug therapy: Secondary | ICD-10-CM | POA: Insufficient documentation

## 2023-05-27 DIAGNOSIS — D509 Iron deficiency anemia, unspecified: Secondary | ICD-10-CM | POA: Insufficient documentation

## 2023-05-27 DIAGNOSIS — I447 Left bundle-branch block, unspecified: Secondary | ICD-10-CM | POA: Insufficient documentation

## 2023-05-27 LAB — BASIC METABOLIC PANEL
Anion gap: 9 (ref 5–15)
BUN: 29 mg/dL — ABNORMAL HIGH (ref 8–23)
CO2: 22 mmol/L (ref 22–32)
Calcium: 9.3 mg/dL (ref 8.9–10.3)
Chloride: 105 mmol/L (ref 98–111)
Creatinine, Ser: 1.23 mg/dL — ABNORMAL HIGH (ref 0.44–1.00)
GFR, Estimated: 43 mL/min — ABNORMAL LOW (ref >60)
Glucose, Bld: 137 mg/dL — ABNORMAL HIGH (ref 70–99)
Potassium: 5.2 mmol/L — ABNORMAL HIGH (ref 3.5–5.1)
Sodium: 136 mmol/L (ref 135–145)

## 2023-05-27 LAB — CBC
HCT: 41.7 % (ref 36.0–46.0)
Hemoglobin: 13.1 g/dL (ref 12.0–15.0)
MCH: 27 pg (ref 26.0–34.0)
MCHC: 31.4 g/dL (ref 30.0–36.0)
MCV: 86 fL (ref 80.0–100.0)
Platelets: 332 10*3/uL (ref 150–400)
RBC: 4.85 MIL/uL (ref 3.87–5.11)
RDW: 20.9 % — ABNORMAL HIGH (ref 11.5–15.5)
WBC: 7 10*3/uL (ref 4.0–10.5)
nRBC: 0 % (ref 0.0–0.2)

## 2023-05-27 LAB — FERRITIN: Ferritin: 52 ng/mL (ref 11–307)

## 2023-05-27 LAB — IRON AND TIBC
Iron: 34 ug/dL (ref 28–170)
Saturation Ratios: 7 % — ABNORMAL LOW (ref 10.4–31.8)
TIBC: 456 ug/dL — ABNORMAL HIGH (ref 250–450)
UIBC: 422 ug/dL

## 2023-05-27 LAB — BRAIN NATRIURETIC PEPTIDE: B Natriuretic Peptide: 354.1 pg/mL — ABNORMAL HIGH (ref 0.0–100.0)

## 2023-05-27 MED ORDER — SPIRONOLACTONE 25 MG PO TABS: 50.0000 mg | ORAL_TABLET | Freq: Every evening | ORAL | 3 refills | Status: DC

## 2023-05-27 MED ORDER — FUROSEMIDE 40 MG PO TABS: 80.0000 mg | ORAL_TABLET | Freq: Two times a day (BID) | ORAL | 3 refills | Status: DC

## 2023-05-27 NOTE — Progress Notes (Signed)
ReDS Vest / Clip - 05/27/23 0900       ReDS Vest / Clip   Station Marker A    Ruler Value 25    ReDS Value Range Moderate volume overload    ReDS Actual Value 36

## 2023-05-27 NOTE — Patient Instructions (Signed)
INCREASE spironolactone to 50 mg ( 2 Tabs) nightly.  KEEP lasix at 80 mg Twice daily  Labs done today, your results will be available in MyChart, we will contact you for abnormal readings.  Repeat blood work in 10 days  Your physician has requested that you have an echocardiogram. Echocardiography is a painless test that uses sound waves to create images of your heart. It provides your doctor with information about the size and shape of your heart and how well your heart's chambers and valves are working. This procedure takes approximately one hour. There are no restrictions for this procedure. Please do NOT wear cologne, perfume, aftershave, or lotions (deodorant is allowed). Please arrive 15 minutes prior to your appointment time.  Randon Goldsmith for the device clinic will call you to arrange monitoring of your device for fluid.  Your physician recommends that you schedule a follow-up appointment in: 6 weeks  If you have any questions or concerns before your next appointment please send [place] a message through [place] or call our office at [phone].    TO LEAVE A MESSAGE FOR THE NURSE SELECT OPTION 2, PLEASE LEAVE A MESSAGE INCLUDING: YOUR NAME DATE OF BIRTH CALL BACK NUMBER REASON FOR CALL**this is important as we prioritize the call backs  YOU WILL RECEIVE A CALL BACK THE SAME DAY AS LONG AS YOU CALL BEFORE 4:00 PM  At the [facility], you and your health needs are our priority. As part of our continuing mission to provide you with exceptional heart care, we have created designated Provider Care Teams. These Care Teams include your primary Cardiologist (physician) and Advanced Practice Providers (APPs- Physician Assistants and Nurse Practitioners) who all work together to provide you with the care you need, when you need it.   You may see any of the following providers on your designated Care Team at your next follow up: Dr Arvilla Meres Dr Marca Ancona Dr.  Marcos Eke, NP Robbie Lis, [place] [facility] [place], [place] Brynda Peon, NP Karle Plumber, PharmD   Please be sure to bring in all your medications bottles to every appointment.    Thank you for choosing [facility]

## 2023-05-28 NOTE — Progress Notes (Signed)
PCP: Lucianne Lei, MD  Cardiology: Garwin Brothers, MD HF Cardiology: Dr. Shirlee Latch  87 y.o. with history of chronic systolic CHF (nonischemic cardiomyopathy), chronic LBBB, and mitral regurgitation was referred by Dr. Excell Seltzer for CHF evaluation prior to Mitraclip placement.   CHF was first noted in 12/20, she was admitted to the hospital in [facility] at that time.  LHC showed nonobstructive CAD.  She later followed up with Dr. Tomie China and had echo in 3/21, showing EF 35-40% with concern for severe MR.  TEE was then done in 4/21 and I reviewed it today, showing EF 30-35% with septal-lateral dyssynchrony, moderate-severe functional MR, normal RV, severe TR.  She has had a LBBB on ECGs in 2021, she did not have LBBB in 2019.    Cardiac MRI in 6/21 showed moderate LV dilation, EF 27%, moderately decreased RV function with EF 31%, no LGE, probably moderate MR.   She had St Jude CRT-P device implanted in 7/21.  Echo in 9/21 showed EF up to 40% with normal RV, mild-moderate MR. Echo 3/23 showed EF 40-45%, mild LVH, mildly decreased RV systolic function, moderate MR.   Today she returns for HF follow up.  She called in last week with increased dyspnea and peripheral edema.  We asked her to increase Lasix to 80 mg bid.  She has felt better since that time.  Weight remains up 7 lbs since last appointment.  She is short of breath after walking about 200 feet, this is her baseline. She is short of breath walking up stairs.  No chest pain. No orthopnea/PND.   REDs clip 36%  St Jude device interrogation: >99% BiV pacing, no AF/VT, thoracic impedance recently decreased but now back up to baseline.   ECG (personally reviewed): NSR, BiV-pacing   Labs (4/21): K 4.1, creatinine 1.16 Labs (5/21): K 3.8, creatinine 1.21 => 1.1, BNP 2424 => 2201, digoxin 0.4 Labs (6/21): K 4.6, creatinine 1.17, hgb 10.9 Labs (7/21): K 4.4, creatinine 1.11, digoxin 0.8 Labs (11/21): K 4.8, creatinine 1.22 Labs (3/22): K 4.5,  creatinine 1.25, LDL 96, TGs 291 Labs (5/22): K 4.6, creatinine 1.2 Labs (6/22): LDL 104, LFTs normal Labs (12/22): K 5.2, creatinine 1.31 Labs (1/23): K 4.1, creatinine 1.08 Labs (3/23): K 3.5, creatinine 1.31, LDL 35, HDL 36 Labs (7/23): K 4.4, creatinine 1.12 Labs (1/24): hgb 8.9, LDL 32 Labs (6/24): hgb 10.9, K 3.6, creatinine 1.12  PMH: 1. LBBB: Chronic.  2. Anemia: Prior GI workup in [place] was negative.  No history of overt GI bleeding. Fe deficiency.  3. Type 2 diabetes 4. Hyperlipidemia 5. CAD: LHC (2020) with 40% ostial left main, 50% mid RCA.  6. Chronic systolic CHF: Noted since 12/20.  Nonischemic cardiomyopathy.  - TEE (4/21): EF 30-35%, septal-lateral dyssynchrony, moderate-severe MR appears functional, normal RV size and systolic function, severe biatrial enlargement, severe TR.  - Cardiac MRI (6/21): Moderate LV dilation, EF 27%; moderately decreased RV function with EF 31%, no LGE, probably moderate MR.  - St Jude CRT-P implanted 7/21.  - Echo (9/21): EF 40%, diffuse hypokinesis, mildly decreased RV systolic function, mild-moderate MR, normal IVC.  - Echo (3/23): EF 40-45%, mild LVH, mildly decreased RV systolic function, moderate MR. 7. GERD 8. Hyperlipidemia 9. Hypothyroidism 10. Mitral regurgitation: Suspected to be functional.  Echo in 9/21 with improved MR, appears mild-moderate.  11. Fe deficiency anemia 12. Fatty liver 13. CKD stage 3  Social History   Socioeconomic History   Marital status: Widowed  Spouse name: Not on file   Number of children: 2   Years of education: Not on file   Highest education level: Not on file  Occupational History   Occupation: retired  Tobacco Use   Smoking status: Former   Smokeless tobacco: Never  Advertising account planner   Vaping status: Never Used  Substance and Sexual Activity   Alcohol use: No   Drug use: No   Sexual activity: Not on file  Other Topics Concern   Not on file  Social History Narrative   Not on file    Social Determinants of Health   Financial Resource Strain: Not on file  Food Insecurity: Not on file  Transportation Needs: Not on file  Physical Activity: Not on file  Stress: Not on file  Social Connections: Not on file  Intimate Partner Violence: Not on file   Family History  Problem Relation Age of Onset   Diabetes Mother    Heart disease Mother    Hypertension Mother    Heart attack Mother    Emphysema Father    Hypertension Father    Heart attack Father    Breast cancer Sister    Diabetes Sister    Emphysema Sister    COPD Sister    Asthma Sister    Hypertension Sister    Lung cancer Brother    Diabetes Brother    Emphysema Brother    COPD Brother    Asthma Brother    Heart disease Brother    Liver disease Neg Hx    ROS: All systems reviewed and negative except as per HPI.   Current Outpatient Medications  Medication Sig Dispense Refill   albuterol (PROVENTIL) (2.5 MG/3ML) 0.083% nebulizer solution Take 2.5 mg by nebulization every 6 (six) hours as needed for wheezing or shortness of breath.      Apoaequorin (PREVAGEN) 10 MG CAPS Take 10 mg by mouth in the morning.     aspirin EC 81 MG tablet Take 1 tablet (81 mg total) by mouth daily. (Patient taking differently: Take 81 mg by mouth daily after supper.) 30 tablet 11   b complex vitamins tablet Take 1 tablet by mouth daily after lunch.     cetirizine (ZYRTEC) 10 MG tablet Take 10 mg by mouth in the morning.     Cholecalciferol (VITAMIN D-3 PO) Take 1 tablet by mouth daily after lunch.     cyclobenzaprine (FLEXERIL) 10 MG tablet Take 10 mg by mouth at bedtime as needed for muscle spasms.      famotidine (PEPCID) 40 MG tablet Take 40 mg by mouth daily as needed for heartburn.     fluticasone (FLONASE) 50 MCG/ACT nasal spray Place 2 sprays into both nostrils as needed for allergies or rhinitis.     JARDIANCE 10 MG TABS tablet TAKE 1 TABLET DAILY BEFORE BREAKFAST 90 tablet 3   levalbuterol (XOPENEX HFA) 45 MCG/ACT  inhaler Inhale 2 puffs into the lungs every 4 (four) hours as needed for wheezing.     meclizine (ANTIVERT) 25 MG tablet Take 25 mg by mouth every 8 (eight) hours as needed for dizziness or nausea.      metFORMIN (GLUCOPHAGE) 500 MG tablet Take 250 mg by mouth 2 (two) times daily.     metoprolol succinate (TOPROL-XL) 100 MG 24 hr tablet TAKE 1 TABLET TWICE A DAY (NOTE CHANGE IN DOSAGE) 135 tablet 4   montelukast (SINGULAIR) 10 MG tablet Take 10 mg by mouth at bedtime.  naproxen sodium (ALEVE) 220 MG tablet Take 440 mg by mouth daily as needed for pain (pain).     nitroGLYCERIN (NITROSTAT) 0.4 MG SL tablet Place 1 tablet (0.4 mg total) under the tongue every 5 (five) minutes as needed for chest pain. 30 tablet 2   Omega-3 Fatty Acids (FISH OIL) 1200 MG CAPS Take 1,200 mg by mouth in the morning and at bedtime.     Polyethyl Glycol-Propyl Glycol (SYSTANE OP) Place 1 drop into both eyes 4 (four) times daily.     polyethylene glycol (MIRALAX / GLYCOLAX) packet Take 17 g by mouth in the morning.     rosuvastatin (CRESTOR) 20 MG tablet TAKE 1 TABLET DAILY ( CHANGE IN DOSAGE ) 90 tablet 3   TIROSINT 75 MCG CAPS Take 75 mcg by mouth daily before breakfast.     triamcinolone cream (KENALOG) 0.1 % Apply 1 Application topically daily as needed (skin irritation/eczema).     vitamin E 400 UNIT capsule Take 400 Units by mouth daily after lunch.     Wheat Dextrin (BENEFIBER) POWD Take 1 Scoop by mouth daily as needed for constipation (constipation/regularity).     furosemide (LASIX) 40 MG tablet Take 2 tablets (80 mg total) by mouth 2 (two) times daily. 180 tablet 3   pantoprazole (PROTONIX) 40 MG tablet Take 40 mg by mouth daily before breakfast. (Patient not taking: Reported on 05/27/2023)     spironolactone (ALDACTONE) 25 MG tablet Take 2 tablets (50 mg total) by mouth at bedtime. 180 tablet 3   No current facility-administered medications for this encounter.   Wt Readings from Last 3 Encounters:   05/27/23 73.2 kg (161 lb 6.4 oz)  04/21/23 71.2 kg (157 lb)  04/19/23 70.8 kg (156 lb)   BP 134/78   Pulse 62   Wt 73.2 kg (161 lb 6.4 oz)   SpO2 98%   BMI 30.50 kg/m   General: NAD Neck: No JVD, no thyromegaly or thyroid nodule.  Lungs: Clear to auscultation bilaterally with normal respiratory effort. CV: Nondisplaced PMI.  Heart regular S1/S2, no S3/S4, no murmur.  No peripheral edema.  No carotid bruit.  Normal pedal pulses.  Abdomen: Soft, nontender, no hepatosplenomegaly, no distention.  Skin: Intact without lesions or rashes.  Neurologic: Alert and oriented x 3.  Psych: Normal affect. Extremities: No clubbing or cyanosis.  HEENT: Normal.    Assessment/Plan: 1. Chronic systolic CHF: Patient has a nonischemic cardiomyopathy of uncertain etiology.  She has significant mitral regurgitation.  This is functional, and I do not think that it explains her cardiomyopathy (though mitral regurgitation likely worsens symptoms).  She has a LBBB that is new since the prior ECG in 2019, cannot rule out LBBB cardiomyopathy.  Prior myocarditis is also a consideration.  TEE in 4/21 showed EF 30-35% with prominent septal-lateral dyssynchrony.  Cardiac MRI in 6/21 showed moderate LV dilation, EF 27%, moderately decreased RV function with EF 31%, no LGE, probably moderate MR.  St Jude CRT-P device implanted in 7/21.  Echo in 9/21 with EF up to 40%, improved MR (only mild-moderate). Echo 3/23 showed EF 40-45%, mild LVH, mildly decreased RV systolic function, moderate MR.  Recent worsening symptoms, now improved after increasing Lasix to 80 mg bid.  She feels like she is back to baseline.  She does not look volume overloaded on exam, REDS clip minimally elevated.  Thoracic impedance back to baseline on Corvue.  NYHA class II symptoms.  - Continue higher dose of Lasix, 80 mg bid.  -  Increase spironolactone to 50 mg daily, BMET/BNP today and BMET in 10 days.  - Unable to take Entresto or valsartan due to  angioedema. Would not, therefore, try ACEI.  - Continue Toprol XL 100 mg bid.    - Continue Jardiance 10 mg daily.   - I will arrange for repeat echo.   - I will have her followed by Randon Goldsmith for remote monitoring of volume status via Corvue.   2. CAD: Nonobstructive on 2020 cath.  No chest pain.  - Continue ASA 81  - Continue Crestor 20 daily, good lipids in 1/24.  3. Mitral regurgitation: She has functional mitral regurgitation, moderate-severe/3+ at least on TEE in 4/21.  However, after CRT, mitral regurgitation was mild-moderate on 9/21 echo and moderate on 3/23 echo.  - Repeat echo.   4. Fatty liver: On Crestor.  5. CKD: Stage 3.  BMET today. - Avoid NSAIDs.   6. Fe deficiency anemia: Check iron studies.   Followup 6 wks with APP.   Marca Ancona  05/28/2023

## 2023-05-30 ENCOUNTER — Other Ambulatory Visit (HOSPITAL_COMMUNITY): Payer: Self-pay

## 2023-05-30 DIAGNOSIS — I5022 Chronic systolic (congestive) heart failure: Secondary | ICD-10-CM

## 2023-05-31 ENCOUNTER — Other Ambulatory Visit: Payer: Self-pay

## 2023-05-31 ENCOUNTER — Telehealth: Payer: Self-pay

## 2023-05-31 NOTE — Telephone Encounter (Signed)
Pt was scheduled for a Previsit appointment on 06/21/2023 at 1:00 PM: Location Provided.  Pt verbalized understanding with all questions answered.

## 2023-06-02 ENCOUNTER — Telehealth: Payer: Self-pay

## 2023-06-02 DIAGNOSIS — J309 Allergic rhinitis, unspecified: Secondary | ICD-10-CM | POA: Diagnosis not present

## 2023-06-02 DIAGNOSIS — Z79899 Other long term (current) drug therapy: Secondary | ICD-10-CM | POA: Diagnosis not present

## 2023-06-02 DIAGNOSIS — Z6828 Body mass index (BMI) 28.0-28.9, adult: Secondary | ICD-10-CM | POA: Diagnosis not present

## 2023-06-02 DIAGNOSIS — R413 Other amnesia: Secondary | ICD-10-CM | POA: Diagnosis not present

## 2023-06-02 DIAGNOSIS — I10 Essential (primary) hypertension: Secondary | ICD-10-CM | POA: Diagnosis not present

## 2023-06-02 DIAGNOSIS — R5383 Other fatigue: Secondary | ICD-10-CM | POA: Diagnosis not present

## 2023-06-02 DIAGNOSIS — E785 Hyperlipidemia, unspecified: Secondary | ICD-10-CM | POA: Diagnosis not present

## 2023-06-02 DIAGNOSIS — E1140 Type 2 diabetes mellitus with diabetic neuropathy, unspecified: Secondary | ICD-10-CM | POA: Diagnosis not present

## 2023-06-02 NOTE — Telephone Encounter (Signed)
Spoke with patient and ICM intro given.  Patient is agreeable to ICM monthly follow up.  Advised transmission will send automatically between 12 Midnight and 6:00 AM if monitor is by bedside.   Provided ICM direct number and explained should call if experiencing any fluid symptoms. 1st ICM remote transmission scheduled for 06/05/2022 to check fluid levels after Dr Shirlee Latch increase Furosemide to 80 mg bid.

## 2023-06-02 NOTE — Telephone Encounter (Signed)
-----   Message from Nurse Emer C sent at 05/27/2023  9:29 AM EDT ----- Dr Shirlee Latch would like monthly fluid monitor ing please

## 2023-06-02 NOTE — Telephone Encounter (Signed)
Referred to [facility] clinic by Dr [name].   Attempted call to patient for ICM intro and left message for return call.

## 2023-06-02 NOTE — Telephone Encounter (Signed)
Attempted call to patient for ICM intro and left message for return call.

## 2023-06-03 ENCOUNTER — Ambulatory Visit (HOSPITAL_COMMUNITY)
Admission: RE | Admit: 2023-06-03 | Discharge: 2023-06-03 | Disposition: A | Discharge status: Home / Self Care | Payer: Medicare Other | Source: Ambulatory Visit | Attending: Cardiology | Admitting: Cardiology

## 2023-06-03 DIAGNOSIS — I2510 Atherosclerotic heart disease of native coronary artery without angina pectoris: Secondary | ICD-10-CM | POA: Insufficient documentation

## 2023-06-03 DIAGNOSIS — E785 Hyperlipidemia, unspecified: Secondary | ICD-10-CM | POA: Insufficient documentation

## 2023-06-03 DIAGNOSIS — I340 Nonrheumatic mitral (valve) insufficiency: Secondary | ICD-10-CM | POA: Insufficient documentation

## 2023-06-03 DIAGNOSIS — G4730 Sleep apnea, unspecified: Secondary | ICD-10-CM | POA: Diagnosis not present

## 2023-06-03 DIAGNOSIS — E119 Type 2 diabetes mellitus without complications: Secondary | ICD-10-CM | POA: Diagnosis not present

## 2023-06-03 DIAGNOSIS — I110 Hypertensive heart disease with heart failure: Secondary | ICD-10-CM | POA: Diagnosis not present

## 2023-06-03 DIAGNOSIS — I5022 Chronic systolic (congestive) heart failure: Secondary | ICD-10-CM | POA: Diagnosis not present

## 2023-06-06 ENCOUNTER — Encounter: Payer: Self-pay | Admitting: Cardiology

## 2023-06-07 ENCOUNTER — Encounter (HOSPITAL_COMMUNITY): Payer: Medicare Other

## 2023-06-07 ENCOUNTER — Ambulatory Visit: Payer: Medicare Other | Attending: Internal Medicine

## 2023-06-07 ENCOUNTER — Other Ambulatory Visit (HOSPITAL_COMMUNITY): Payer: Self-pay

## 2023-06-07 DIAGNOSIS — Z950 Presence of cardiac pacemaker: Secondary | ICD-10-CM | POA: Diagnosis not present

## 2023-06-07 DIAGNOSIS — I5022 Chronic systolic (congestive) heart failure: Secondary | ICD-10-CM | POA: Diagnosis not present

## 2023-06-07 MED ORDER — SPIRONOLACTONE 25 MG PO TABS: 25.0000 mg | ORAL_TABLET | Freq: Every evening | ORAL | Status: DC

## 2023-06-07 NOTE — Progress Notes (Signed)
EPIC Encounter for ICM Monitoring  Patient Name: Vanessa Johnson is a 87 y.o. female Date: 06/07/2023 Primary Care Physican: Lucianne Lei, MD Primary Cardiologist: Revankar/McLean Electrophysiologist: Rae Roam Pacing: >99%  06/07/2023 Weight: 153.7 lbs        1st ICM remote transmission.  Heart Failure questions reviewed.  Pt reports weight loss and breathing much improved after Furosemide dosage increased on 7/26.  She is feeling well at this time.    CorVue thoracic impedance suggesting dryness from 8/2-8/12 in response to increased Lasix dosage on 7/26 but has returned to baseline on 8/13.  Prescribed:  Furosemide 40 mg take 2 tablet(s) (80 mg total) by mouth twice a day. Spironolactone 25 mg take 1 tablet by mouth at bedtime.  Labs: 05/27/2023 Creatinine 1.23, BUN 29, Potassium 5.2, Sodium 136, GFR 43  11/11/2022 Creatinine 1.12, BUN 24, Potassium 3.6, Sodium 136, GFR 48  A complete set of results can be found in Results Review.  Recommendations: Copy sent to Dr Shirlee Latch for review.  Follow-up plan: ICM clinic phone appointment on 06/13/2023 to recheck fluid levels.   91 day device clinic remote transmission 07/21/2023.    EP/Cardiology Office Visits: 06/30/2023 with Dr. Tomie China.  07/12/2023 with HF clinic.  Copy of ICM check sent to Dr. Ladona Ridgel.   3 month ICM trend: 06/07/2023.    12-14 Month ICM trend:     Karie Soda, RN 06/07/2023 10:02 AM

## 2023-06-13 ENCOUNTER — Ambulatory Visit: Payer: Medicare Other | Attending: Internal Medicine

## 2023-06-13 DIAGNOSIS — Z950 Presence of cardiac pacemaker: Secondary | ICD-10-CM

## 2023-06-13 DIAGNOSIS — I5022 Chronic systolic (congestive) heart failure: Secondary | ICD-10-CM

## 2023-06-14 NOTE — Progress Notes (Signed)
EPIC Encounter for ICM Monitoring  Patient Name: Vanessa Johnson is a 87 y.o. female Date: 06/14/2023 Primary Care Physican: Lucianne Lei, MD Primary Cardiologist: Revankar/McLean Electrophysiologist: Rae Roam Pacing: >99%          06/07/2023 Weight: 153.7 lbs  06/14/2023 Weight: 156 lbs                                                            1st ICM remote transmission.  Heart Failure questions reviewed.  Pt reports weight up 3 lbs since last week.  Breathing at baseline and she is feeling well at this time.   Unable to determine cause of possible fluid returning.  She drinks <64 oz fluid daily and diet has not changed.   CorVue thoracic impedance suggesting after returning to baseline on 8/13, possible fluid accumulation returned on 8/14.   Prescribed:  Furosemide 40 mg take 2 tablet(s) (80 mg total) by mouth twice a day. (Increased on 8/2) Spironolactone 25 mg take 1 tablet by mouth at bedtime.   Labs: 05/27/2023 Creatinine 1.23, BUN 29, Potassium 5.2, Sodium 136, GFR 43  11/11/2022 Creatinine 1.12, BUN 24, Potassium 3.6, Sodium 136, GFR 48  A complete set of results can be found in Results Review.   Recommendations: Copy sent to Dr Shirlee Latch for review and recommendations if needed.  Confirmed she is taking Lasix 80 mg twice a day   Follow-up plan: ICM clinic phone appointment on 06/20/2023 to recheck fluid levels.   91 day device clinic remote transmission 07/21/2023.     EP/Cardiology Office Visits: 06/30/2023 with Dr. Tomie China.  07/12/2023 with HF clinic.   Copy of ICM check sent to Dr. Ladona Ridgel.    3 month ICM trend: 06/13/2023.    12-14 Month ICM trend:     Karie Soda, RN 06/14/2023 2:17 PM

## 2023-06-14 NOTE — Progress Notes (Signed)
Lasix 120 qam/80 qpm x 3 days then 80 mg bid.

## 2023-06-14 NOTE — Progress Notes (Signed)
Spoke with patient and advised Dr Shirlee Latch recommended she take Furosemide 120 mg every morning and 80 mg every evening x 3 days.  After 3rd day return to 80 mg twice a day.  She verbalized understanding and repeated instructions back correctly.  Advised to call back for changes in condition.

## 2023-06-15 ENCOUNTER — Encounter: Payer: Self-pay | Admitting: Oncology

## 2023-06-17 ENCOUNTER — Other Ambulatory Visit: Payer: Self-pay

## 2023-06-17 DIAGNOSIS — I5022 Chronic systolic (congestive) heart failure: Secondary | ICD-10-CM

## 2023-06-18 LAB — BASIC METABOLIC PANEL
BUN/Creatinine Ratio: 29 — ABNORMAL HIGH (ref 12–28)
BUN: 37 mg/dL — ABNORMAL HIGH (ref 8–27)
CO2: 24 mmol/L (ref 20–29)
Calcium: 9.9 mg/dL (ref 8.7–10.3)
Chloride: 96 mmol/L (ref 96–106)
Creatinine, Ser: 1.28 mg/dL — ABNORMAL HIGH (ref 0.57–1.00)
Glucose: 105 mg/dL — ABNORMAL HIGH (ref 70–99)
Potassium: 4.4 mmol/L (ref 3.5–5.2)
Sodium: 138 mmol/L (ref 134–144)
eGFR: 41 mL/min/{1.73_m2} — ABNORMAL LOW (ref >59)

## 2023-06-20 ENCOUNTER — Ambulatory Visit: Payer: Medicare Other

## 2023-06-20 DIAGNOSIS — Z950 Presence of cardiac pacemaker: Secondary | ICD-10-CM | POA: Diagnosis not present

## 2023-06-20 DIAGNOSIS — I5022 Chronic systolic (congestive) heart failure: Secondary | ICD-10-CM | POA: Diagnosis not present

## 2023-06-20 DIAGNOSIS — R0600 Dyspnea, unspecified: Secondary | ICD-10-CM | POA: Diagnosis not present

## 2023-06-20 DIAGNOSIS — R911 Solitary pulmonary nodule: Secondary | ICD-10-CM | POA: Diagnosis not present

## 2023-06-20 DIAGNOSIS — J4520 Mild intermittent asthma, uncomplicated: Secondary | ICD-10-CM | POA: Diagnosis not present

## 2023-06-20 DIAGNOSIS — G4733 Obstructive sleep apnea (adult) (pediatric): Secondary | ICD-10-CM | POA: Diagnosis not present

## 2023-06-20 DIAGNOSIS — J310 Chronic rhinitis: Secondary | ICD-10-CM | POA: Diagnosis not present

## 2023-06-21 ENCOUNTER — Ambulatory Visit (AMBULATORY_SURGERY_CENTER): Payer: Medicare Other

## 2023-06-21 VITALS — Ht 61.0 in | Wt 157.0 lb

## 2023-06-21 DIAGNOSIS — D649 Anemia, unspecified: Secondary | ICD-10-CM

## 2023-06-21 NOTE — Progress Notes (Signed)
No egg or soy allergy known to patient  No issues known to pt with past sedation with any surgeries or procedures Patient denies ever being told they had issues or difficulty with intubation  No FH of Malignant Hyperthermia Pt is not on diet pills Pt is not on  home 02  Pt is not on blood thinners  Pt denies issues with constipation takes Miralax & Benfiber and sometimes Dulcolax No A fib or A flutter Have any cardiac testing pending--no Pt can ambulate independently Pt denies use of chewing tobacco Discussed diabetic I weight loss medication holds Discussed NSAID holds Checked BMI Pt instructed to use Singlecare[employer]com or GoodRx for a price reduction on prep  Patient's chart reviewed by Vanessa Johnson CNRA prior to previsit and patient appropriate for the [facility].  Pre visit completed and red dot placed by patient's name on their procedure day (on provider's schedule).

## 2023-06-23 NOTE — Progress Notes (Signed)
EPIC Encounter for ICM Monitoring  Patient Name: Vanessa Johnson is a 87 y.o. female Date: 06/23/2023 Primary Care Physican: Lucianne Lei, MD Primary Cardiologist: Revankar/McLean Electrophysiologist: Rae Roam Pacing: >99%          06/07/2023 Weight: 153.7 lbs  06/14/2023 Weight: 156 lbs 06/23/2023 Weight: 153.5 lbs                                                            Heart Failure questions reviewed.  Pt reports weight is at baseline.  Breathing at baseline and she is feeling well at this time.    Diet:   She eats a lot of restaurant foods.  She drinks <64 oz fluid daily and diet has not changed.   CorVue thoracic impedance suggesting possible fluid accumulation from 8/14 - 8/22.   Prescribed:  Furosemide 40 mg take 2 tablet(s) (80 mg total) by mouth twice a day. (Increased on 8/2) Spironolactone 25 mg take 1 tablet by mouth at bedtime.   Labs: 06/17/2023 Creatinine 1.28, BUN 37, Potassium 4.4, Sodium 138 05/27/2023 Creatinine 1.23, BUN 29, Potassium 5.2, Sodium 136, GFR 43  11/11/2022 Creatinine 1.12, BUN 24, Potassium 3.6, Sodium 136, GFR 48  A complete set of results can be found in Results Review.   Recommendations:  Recommendation to limit salt intake to 2000 mg daily and fluid intake to 64 oz daily.  Encouraged to call if experiencing any fluid symptoms.    Follow-up plan: ICM clinic phone appointment on 07/11/2023.   91 day device clinic remote transmission 07/21/2023.     EP/Cardiology Office Visits: 06/30/2023 with Dr. Tomie China.  07/12/2023 with HF clinic.   Copy of ICM check sent to Dr. Ladona Ridgel. .   3 month ICM trend: 06/20/2023.    12-14 Month ICM trend:     Karie Soda, RN 06/23/2023 2:17 PM

## 2023-06-24 ENCOUNTER — Encounter (HOSPITAL_COMMUNITY): Payer: Self-pay | Admitting: Gastroenterology

## 2023-06-29 ENCOUNTER — Other Ambulatory Visit: Payer: Self-pay

## 2023-06-29 DIAGNOSIS — M1990 Unspecified osteoarthritis, unspecified site: Secondary | ICD-10-CM | POA: Insufficient documentation

## 2023-06-30 ENCOUNTER — Encounter: Payer: Self-pay | Admitting: Cardiology

## 2023-06-30 ENCOUNTER — Ambulatory Visit: Payer: Medicare Other | Attending: Cardiology | Admitting: Cardiology

## 2023-06-30 VITALS — BP 134/68 | HR 72 | Ht 61.0 in | Wt 156.0 lb

## 2023-06-30 DIAGNOSIS — Z7984 Long term (current) use of oral hypoglycemic drugs: Secondary | ICD-10-CM

## 2023-06-30 DIAGNOSIS — G4730 Sleep apnea, unspecified: Secondary | ICD-10-CM

## 2023-06-30 DIAGNOSIS — I10 Essential (primary) hypertension: Secondary | ICD-10-CM | POA: Diagnosis not present

## 2023-06-30 DIAGNOSIS — E782 Mixed hyperlipidemia: Secondary | ICD-10-CM | POA: Diagnosis not present

## 2023-06-30 DIAGNOSIS — E088 Diabetes mellitus due to underlying condition with unspecified complications: Secondary | ICD-10-CM | POA: Diagnosis not present

## 2023-06-30 DIAGNOSIS — I428 Other cardiomyopathies: Secondary | ICD-10-CM

## 2023-06-30 DIAGNOSIS — S80862A Insect bite (nonvenomous), left lower leg, initial encounter: Secondary | ICD-10-CM | POA: Diagnosis not present

## 2023-06-30 DIAGNOSIS — I2510 Atherosclerotic heart disease of native coronary artery without angina pectoris: Secondary | ICD-10-CM

## 2023-06-30 DIAGNOSIS — D508 Other iron deficiency anemias: Secondary | ICD-10-CM

## 2023-06-30 DIAGNOSIS — L03116 Cellulitis of left lower limb: Secondary | ICD-10-CM | POA: Diagnosis not present

## 2023-06-30 NOTE — Progress Notes (Signed)
Cardiology Office Note:    Date:  06/30/2023   ID:  Vanessa Johnson, DOB [birth year], MRN [id number]  PCP:  Lucianne Lei, MD  Cardiologist:  Garwin Brothers, MD   Referring MD: Lucianne Lei, MD    ASSESSMENT:    1. Coronary artery disease involving native coronary artery of native heart without angina pectoris   2. Benign essential HTN   3. Nonischemic cardiomyopathy (HCC)   4. Sleep apnea, unspecified type   5. Diabetes mellitus due to underlying condition with complication, without long-term current use of insulin (HCC)   6. Mixed dyslipidemia   7. Other iron deficiency anemia    PLAN:    In order of problems listed above:  Coronary artery disease: Secondary prevention stressed with the patient.  Importance of compliance with diet medication stressed and she vocalized understanding.  She was advised to exercise to the best of her ability. Nonischemic cardiomyopathy: Ejection fraction is much better at 45%.  She is happy about it.  She takes her medications meticulously. Essential hypertension: Blood pressure stable and diet was emphasized.  Lifestyle modification urged. Mixed dyslipidemia: On lipid-lowering medications.  Lipids were reviewed from recent evaluation Iron deficiency anemia: She request blood work as she is seeing her hematologist in the next few days and I will oblige. Atrial fibrillation:I discussed with the patient atrial fibrillation, disease process. Management and therapy including rate and rhythm control, anticoagulation benefits and potential risks were discussed extensively with the patient. Patient had multiple questions which were answered to patient's satisfaction. Patient will be seen in follow-up appointment in 6 months or earlier if the patient has any concerns.    Medication Adjustments/Labs and Tests Ordered: Current medicines are reviewed at length with the patient today.  Concerns regarding medicines are outlined above.  Orders Placed This Encounter   Procedures   Basic metabolic panel   Iron, TIBC and Ferritin Panel   No orders of the defined types were placed in this encounter.    No chief complaint on file.    History of Present Illness:    Vanessa Johnson is a 87 y.o. female.  Patient has past medical history of coronary artery disease, essential hypertension, mixed dyslipidemia, diabetes mellitus, cardiomyopathy and defibrillator.  She has history of iron deficiency anemia.  She denies any problems at this time and takes care of activities of daily living.  No chest pain orthopnea or PND.  At the time of my evaluation, the patient is alert awake oriented and in no distress.  She exercises on a bicycle like stationary equipment on a daily basis without any symptoms.  Past Medical History:  Diagnosis Date   Allergic rhinitis    Anemia 10/2022   Arthritis    Back pain    left lower back   Benign essential HTN    Biventricular cardiac pacemaker in situ 08/05/2020   BMI 34.0-34.9,adult    Bronchial asthma    Bronchitis 11/20/2018   CAD (coronary artery disease)    Heart cath 01/2017 [facility]   Cardiology follow-up encounter 01/26/2017   Overview:  Added automatically from request for surgery [id number]  Formatting of this note might be different from the original. Added automatically from request for surgery [id number]   CHF (congestive heart failure) (HCC)    Cholelithiasis with cholecystitis without obstruction 12/04/2015   Diabetes mellitus (HCC)    Diabetes mellitus due to underlying condition with complication, without long-term current use of insulin (HCC) 01/15/2016   Formatting of this note  might be different from the original. Added automatically from request for surgery [id number]   Diabetes mellitus without complication (HCC) 01/15/2016   Dysfunction of right eustachian tube 11/20/2018   Essential hypertension 05/17/2017   Gastritis    Dr. Rayfield Citizen- EGD 2018   GERD (gastroesophageal reflux disease)    Hyperlipidemia     Hypothyroid    Iron deficiency anemia 09/02/2020   Ischiogluteal bursitis of left side    Left bundle branch block 04/02/2020   Mixed dyslipidemia 05/17/2017   Nonischemic cardiomyopathy (HCC) 04/02/2020   Obstructive sleep apnea of adult 12/04/2015   Pre-ulcerative corn or callous    Sleep apnea    On CPAP   Tinea pedis of right foot 01/15/2016   Unsteadiness 08/16/2018   Last Assessment & Plan:  Emergency department follow-up for evaluation of vertigo. Acute onset of vertigo 08/12/2018.  CT head at that time was okay.  Vertiginous symptoms resolved that day.  She persists with some unsteadiness but it is generally improving.  She has chronic history of unsteady that generally is helped by meclizine.  Denies any hearing loss symptoms or ringing in the ears. EXAM sh   Vitamin D deficiency     Past Surgical History:  Procedure Laterality Date   BIV PACEMAKER INSERTION CRT-P N/A 04/24/2020   Procedure: BIV PACEMAKER INSERTION CRT-P;  Surgeon: Marinus Maw, MD;  Location: [facility] INVASIVE CV LAB;  Service: Cardiovascular;  Laterality: N/A;   BREAST BIOPSY  1970, 1980, and 2000   CATARACT EXTRACTION, BILATERAL  2016   CHOLECYSTECTOMY  2017   DILATION AND CURETTAGE OF UTERUS  1996   HEMORRHOIDECTOMY WITH HEMORRHOID BANDING  1970   LEFT HEART CATH AND CORONARY ANGIOGRAPHY  01/2017   [facility]- No intervention   TEE WITHOUT CARDIOVERSION N/A 02/21/2020   Procedure: TRANSESOPHAGEAL ECHOCARDIOGRAM (TEE);  Surgeon: Lars Masson, MD;  Location: [facility] ENDOSCOPY;  Service: Cardiovascular;  Laterality: N/A;   TONSILLECTOMY  1965   TOTAL HIP ARTHROPLASTY Bilateral    left- 2005 and right- 2010    Current Medications: Current Meds  Medication Sig   albuterol (PROVENTIL) (2.5 MG/3ML) 0.083% nebulizer solution Take 2.5 mg by nebulization every 6 (six) hours as needed for wheezing or shortness of breath.    Apoaequorin (PREVAGEN) 10 MG CAPS Take 10 mg by mouth in the morning.    aspirin EC 81 MG tablet Take 1 tablet (81 mg total) by mouth daily.   b complex vitamins tablet Take 1 tablet by mouth daily after lunch.   cetirizine (ZYRTEC) 10 MG tablet Take 10 mg by mouth in the morning.   Cholecalciferol (VITAMIN D-3 PO) Take 1 tablet by mouth daily after lunch.   cyclobenzaprine (FLEXERIL) 10 MG tablet Take 10 mg by mouth at bedtime as needed for muscle spasms.    famotidine (PEPCID) 40 MG tablet Take 40 mg by mouth daily as needed for heartburn.   fluticasone (FLONASE) 50 MCG/ACT nasal spray Place 2 sprays into both nostrils as needed for allergies or rhinitis.   furosemide (LASIX) 40 MG tablet Take 2 tablets (80 mg total) by mouth 2 (two) times daily.   JARDIANCE 10 MG TABS tablet TAKE 1 TABLET DAILY BEFORE BREAKFAST   levalbuterol (XOPENEX HFA) 45 MCG/ACT inhaler Inhale 2 puffs into the lungs every 4 (four) hours as needed for wheezing.   meclizine (ANTIVERT) 25 MG tablet Take 25 mg by mouth every 8 (eight) hours as needed for dizziness or nausea.    metFORMIN (  GLUCOPHAGE) 500 MG tablet Take 250 mg by mouth 2 (two) times daily.   metoprolol succinate (TOPROL-XL) 100 MG 24 hr tablet Take 100 mg by mouth 2 (two) times daily.   montelukast (SINGULAIR) 10 MG tablet Take 10 mg by mouth at bedtime.    naproxen sodium (ALEVE) 220 MG tablet Take 440 mg by mouth daily as needed for pain (pain).   nitroGLYCERIN (NITROSTAT) 0.4 MG SL tablet Place 1 tablet (0.4 mg total) under the tongue every 5 (five) minutes as needed for chest pain.   Omega-3 Fatty Acids (FISH OIL) 1200 MG CAPS Take 1,200 mg by mouth in the morning and at bedtime.   Polyethyl Glycol-Propyl Glycol (SYSTANE OP) Place 1 drop into both eyes 4 (four) times daily.   polyethylene glycol (MIRALAX / GLYCOLAX) packet Take 17 g by mouth in the morning.   rosuvastatin (CRESTOR) 20 MG tablet Take 20 mg by mouth daily.   spironolactone (ALDACTONE) 25 MG tablet Take 1 tablet (25 mg total) by mouth at bedtime.   TIROSINT 75  MCG CAPS Take 75 mcg by mouth daily before breakfast.   triamcinolone cream (KENALOG) 0.1 % Apply 1 Application topically daily as needed (skin irritation/eczema).   vitamin E 400 UNIT capsule Take 400 Units by mouth daily after lunch.   Wheat Dextrin (BENEFIBER) POWD Take 1 Scoop by mouth daily as needed for constipation (constipation/regularity).     Allergies:   Ace inhibitors, Angiotensin receptor blockers, Diovan [valsartan], Entresto [sacubitril-valsartan], Tussionex pennkinetic er [employer] poli-chlorphe poli er], Cephalexin, Codeine, Sulfamethoxazole, Avelox [moxifloxacin], Cefuroxime axetil, Cephalosporins, and Iohexol   Social History   Socioeconomic History   Marital status: Widowed    Spouse name: Not on file   Number of children: 2   Years of education: Not on file   Highest education level: Not on file  Occupational History   Occupation: retired  Tobacco Use   Smoking status: Former   Smokeless tobacco: Never  Advertising account planner   Vaping status: Never Used  Substance and Sexual Activity   Alcohol use: No   Drug use: No   Sexual activity: Not on file  Other Topics Concern   Not on file  Social History Narrative   Not on file   Social Determinants of Health   Financial Resource Strain: Not on file  Food Insecurity: Not on file  Transportation Needs: Not on file  Physical Activity: Not on file  Stress: Not on file  Social Connections: Not on file     Family History: The patient's family history includes Asthma in her brother and sister; Breast cancer in her sister; COPD in her brother and sister; Diabetes in her brother, mother, and sister; Emphysema in her brother, father, and sister; Heart attack in her father and mother; Heart disease in her brother and mother; Hypertension in her father, mother, and sister; Lung cancer in her brother. There is no history of Liver disease, Colon cancer, Colon polyps, Esophageal cancer, Rectal cancer, or Stomach cancer.  ROS:    Please see the history of present illness.    All other systems reviewed and are negative.  EKGs/Labs/Other Studies Reviewed:    The following studies were reviewed today: I discussed my findings with the patient at length.   Recent Labs: 05/27/2023: B Natriuretic Peptide 354.1; Hemoglobin 13.1; Platelets 332 06/17/2023: BUN 37; Creatinine, Ser 1.28; Potassium 4.4; Sodium 138  Recent Lipid Panel    Component Value Date/Time   CHOL 128 11/11/2022 1226   CHOL 203 (  H) 04/22/2021 1028   TRIG 295 (H) 11/11/2022 1226   HDL 37 (L) 11/11/2022 1226   HDL 46 04/22/2021 1028   CHOLHDL 3.5 11/11/2022 1226   VLDL 59 (H) 11/11/2022 1226   LDLCALC 32 11/11/2022 1226   LDLCALC 104 (H) 04/22/2021 1028    Physical Exam:    VS:  BP 134/68   Pulse 72   Ht 5\' 1"  (1.549 m)   Wt 156 lb (70.8 kg)   SpO2 94%   BMI 29.48 kg/m     Wt Readings from Last 3 Encounters:  06/30/23 156 lb (70.8 kg)  06/21/23 157 lb (71.2 kg)  05/27/23 161 lb 6.4 oz (73.2 kg)     GEN: Patient is in no acute distress HEENT: Normal NECK: No JVD; No carotid bruits LYMPHATICS: No lymphadenopathy CARDIAC: Hear sounds regular, 2/6 systolic murmur at the apex. RESPIRATORY:  Clear to auscultation without rales, wheezing or rhonchi  ABDOMEN: Soft, non-tender, non-distended MUSCULOSKELETAL:  No edema; No deformity  SKIN: Warm and dry NEUROLOGIC:  Alert and oriented x 3 PSYCHIATRIC:  Normal affect   Signed, Garwin Brothers, MD  06/30/2023 11:51 AM    [facility]

## 2023-06-30 NOTE — Patient Instructions (Signed)
Medication Instructions:  Your physician recommends that you continue on your current medications as directed. Please refer to the Current Medication list given to you today.  *If you need a refill on your cardiac medications before your next appointment, please call your pharmacy*   Lab Work: Your physician recommends that you have a BMET, Ferritin and TIBC today in the office.  If you have labs (blood work) drawn today and your tests are completely normal, you will receive your results only by: MyChart Message (if you have MyChart) OR A paper copy in the mail If you have any lab test that is abnormal or we need to change your treatment, we will call you to review the results.   Testing/Procedures: None ordered   Follow-Up: At [facility], you and your health needs are our priority.  As part of our continuing mission to provide you with exceptional heart care, we have created designated Provider Care Teams.  These Care Teams include your primary Cardiologist (physician) and Advanced Practice Providers (APPs -  Physician Assistants and Nurse Practitioners) who all work together to provide you with the care you need, when you need it.  We recommend signing up for the patient portal called "MyChart".  Sign up information is provided on this After Visit Summary.  MyChart is used to connect with patients for Virtual Visits (Telemedicine).  Patients are able to view lab/test results, encounter notes, upcoming appointments, etc.  Non-urgent messages can be sent to your provider as well.   To learn more about what you can do with MyChart, go to [email or web address].    Your next appointment:   9 month(s)  The format for your next appointment:   In Person  Provider:   Belva Crome, MD    Other Instructions none  Important Information About Sugar

## 2023-07-01 ENCOUNTER — Telehealth: Payer: Self-pay

## 2023-07-01 LAB — IRON,TIBC AND FERRITIN PANEL
Ferritin: 18 ng/mL (ref 15–150)
Iron Saturation: 12 % — ABNORMAL LOW (ref 15–55)
Iron: 53 ug/dL (ref 27–139)
Total Iron Binding Capacity: 444 ug/dL (ref 250–450)
UIBC: 391 ug/dL — ABNORMAL HIGH (ref 118–369)

## 2023-07-01 LAB — BASIC METABOLIC PANEL
BUN/Creatinine Ratio: 25 (ref 12–28)
BUN: 29 mg/dL — ABNORMAL HIGH (ref 8–27)
CO2: 21 mmol/L (ref 20–29)
Calcium: 9.8 mg/dL (ref 8.7–10.3)
Chloride: 97 mmol/L (ref 96–106)
Creatinine, Ser: 1.14 mg/dL — ABNORMAL HIGH (ref 0.57–1.00)
Glucose: 102 mg/dL — ABNORMAL HIGH (ref 70–99)
Potassium: 3.9 mmol/L (ref 3.5–5.2)
Sodium: 139 mmol/L (ref 134–144)
eGFR: 47 mL/min/{1.73_m2} — ABNORMAL LOW (ref >59)

## 2023-07-01 NOTE — Telephone Encounter (Signed)
Patient called.  Left message for patient to call back.

## 2023-07-01 NOTE — Telephone Encounter (Signed)
-----   Message from [name] Revankar sent at 07/01/2023 12:40 PM EDT ----- Send labs to her Hematologist too. The results of the study is unremarkable. Please inform patient. I will discuss in detail at next appointment. Cc  primary care/referring physician Garwin Brothers, MD 07/01/2023 12:40 PM

## 2023-07-04 ENCOUNTER — Other Ambulatory Visit: Payer: Self-pay

## 2023-07-04 ENCOUNTER — Encounter (HOSPITAL_COMMUNITY): Payer: Self-pay | Admitting: Gastroenterology

## 2023-07-04 ENCOUNTER — Ambulatory Visit (HOSPITAL_BASED_OUTPATIENT_CLINIC_OR_DEPARTMENT_OTHER): Payer: Medicare Other | Admitting: Certified Registered"

## 2023-07-04 ENCOUNTER — Ambulatory Visit (HOSPITAL_COMMUNITY)
Admission: RE | Admit: 2023-07-04 | Discharge: 2023-07-04 | Disposition: A | Discharge status: Home / Self Care | Payer: Medicare Other | Attending: Gastroenterology | Admitting: Gastroenterology

## 2023-07-04 ENCOUNTER — Ambulatory Visit (HOSPITAL_COMMUNITY): Payer: Self-pay | Admitting: Certified Registered"

## 2023-07-04 ENCOUNTER — Encounter (HOSPITAL_COMMUNITY)
Admission: RE | Disposition: A | Discharge status: Home / Self Care | Payer: Self-pay | Source: Home / Self Care | Attending: Gastroenterology

## 2023-07-04 DIAGNOSIS — Z87891 Personal history of nicotine dependence: Secondary | ICD-10-CM | POA: Insufficient documentation

## 2023-07-04 DIAGNOSIS — K449 Diaphragmatic hernia without obstruction or gangrene: Secondary | ICD-10-CM | POA: Diagnosis not present

## 2023-07-04 DIAGNOSIS — K219 Gastro-esophageal reflux disease without esophagitis: Secondary | ICD-10-CM | POA: Insufficient documentation

## 2023-07-04 DIAGNOSIS — K259 Gastric ulcer, unspecified as acute or chronic, without hemorrhage or perforation: Secondary | ICD-10-CM

## 2023-07-04 DIAGNOSIS — D509 Iron deficiency anemia, unspecified: Secondary | ICD-10-CM | POA: Diagnosis not present

## 2023-07-04 DIAGNOSIS — K7581 Nonalcoholic steatohepatitis (NASH): Secondary | ICD-10-CM | POA: Diagnosis not present

## 2023-07-04 DIAGNOSIS — G4733 Obstructive sleep apnea (adult) (pediatric): Secondary | ICD-10-CM | POA: Insufficient documentation

## 2023-07-04 DIAGNOSIS — K229 Disease of esophagus, unspecified: Secondary | ICD-10-CM | POA: Diagnosis not present

## 2023-07-04 DIAGNOSIS — B3781 Candidal esophagitis: Secondary | ICD-10-CM

## 2023-07-04 DIAGNOSIS — E1122 Type 2 diabetes mellitus with diabetic chronic kidney disease: Secondary | ICD-10-CM | POA: Insufficient documentation

## 2023-07-04 DIAGNOSIS — E039 Hypothyroidism, unspecified: Secondary | ICD-10-CM | POA: Insufficient documentation

## 2023-07-04 DIAGNOSIS — I5022 Chronic systolic (congestive) heart failure: Secondary | ICD-10-CM | POA: Diagnosis not present

## 2023-07-04 DIAGNOSIS — Z79899 Other long term (current) drug therapy: Secondary | ICD-10-CM | POA: Insufficient documentation

## 2023-07-04 DIAGNOSIS — I130 Hypertensive heart and chronic kidney disease with heart failure and stage 1 through stage 4 chronic kidney disease, or unspecified chronic kidney disease: Secondary | ICD-10-CM | POA: Insufficient documentation

## 2023-07-04 DIAGNOSIS — B49 Unspecified mycosis: Secondary | ICD-10-CM | POA: Insufficient documentation

## 2023-07-04 DIAGNOSIS — N183 Chronic kidney disease, stage 3 unspecified: Secondary | ICD-10-CM | POA: Diagnosis not present

## 2023-07-04 DIAGNOSIS — I10 Essential (primary) hypertension: Secondary | ICD-10-CM | POA: Diagnosis not present

## 2023-07-04 DIAGNOSIS — I340 Nonrheumatic mitral (valve) insufficiency: Secondary | ICD-10-CM | POA: Diagnosis not present

## 2023-07-04 DIAGNOSIS — Z950 Presence of cardiac pacemaker: Secondary | ICD-10-CM | POA: Diagnosis not present

## 2023-07-04 DIAGNOSIS — K317 Polyp of stomach and duodenum: Secondary | ICD-10-CM | POA: Insufficient documentation

## 2023-07-04 DIAGNOSIS — K209 Esophagitis, unspecified without bleeding: Secondary | ICD-10-CM | POA: Diagnosis not present

## 2023-07-04 DIAGNOSIS — K3189 Other diseases of stomach and duodenum: Secondary | ICD-10-CM | POA: Diagnosis not present

## 2023-07-04 DIAGNOSIS — I2510 Atherosclerotic heart disease of native coronary artery without angina pectoris: Secondary | ICD-10-CM | POA: Diagnosis not present

## 2023-07-04 HISTORY — PX: ESOPHAGOGASTRODUODENOSCOPY (EGD) WITH PROPOFOL: SHX5813

## 2023-07-04 HISTORY — PX: BIOPSY: SHX5522

## 2023-07-04 LAB — CBC
HCT: 42.3 % (ref 36.0–46.0)
Hemoglobin: 13.6 g/dL (ref 12.0–15.0)
MCH: 27.2 pg (ref 26.0–34.0)
MCHC: 32.2 g/dL (ref 30.0–36.0)
MCV: 84.6 fL (ref 80.0–100.0)
Platelets: 371 10*3/uL (ref 150–400)
RBC: 5 MIL/uL (ref 3.87–5.11)
RDW: 17.2 % — ABNORMAL HIGH (ref 11.5–15.5)
WBC: 8.6 10*3/uL (ref 4.0–10.5)
nRBC: 0 % (ref 0.0–0.2)

## 2023-07-04 LAB — COMPREHENSIVE METABOLIC PANEL
ALT: 20 U/L (ref 0–44)
AST: 26 U/L (ref 15–41)
Albumin: 4.3 g/dL (ref 3.5–5.0)
Alkaline Phosphatase: 68 U/L (ref 38–126)
Anion gap: 13 (ref 5–15)
BUN: 46 mg/dL — ABNORMAL HIGH (ref 8–23)
CO2: 26 mmol/L (ref 22–32)
Calcium: 9.2 mg/dL (ref 8.9–10.3)
Chloride: 99 mmol/L (ref 98–111)
Creatinine, Ser: 1.32 mg/dL — ABNORMAL HIGH (ref 0.44–1.00)
GFR, Estimated: 40 mL/min — ABNORMAL LOW (ref >60)
Glucose, Bld: 111 mg/dL — ABNORMAL HIGH (ref 70–99)
Potassium: 3.3 mmol/L — ABNORMAL LOW (ref 3.5–5.1)
Sodium: 138 mmol/L (ref 135–145)
Total Bilirubin: 0.8 mg/dL (ref 0.3–1.2)
Total Protein: 8.1 g/dL (ref 6.5–8.1)

## 2023-07-04 LAB — PROTIME-INR
INR: 1 (ref 0.8–1.2)
Prothrombin Time: 13.2 s (ref 11.4–15.2)

## 2023-07-04 LAB — GLUCOSE, CAPILLARY: Glucose-Capillary: 100 mg/dL — ABNORMAL HIGH (ref 70–99)

## 2023-07-04 SURGERY — ESOPHAGOGASTRODUODENOSCOPY (EGD) WITH PROPOFOL
Anesthesia: Monitor Anesthesia Care

## 2023-07-04 MED ORDER — LACTATED RINGERS IV SOLN: INTRAVENOUS | Status: DC

## 2023-07-04 MED ORDER — OMEPRAZOLE MAGNESIUM 20 MG PO TBEC: 20.0000 mg | DELAYED_RELEASE_TABLET | Freq: Every day | ORAL | 0 refills | Status: DC

## 2023-07-04 MED ORDER — PROPOFOL 500 MG/50ML IV EMUL
INTRAVENOUS | Status: DC | PRN
  Administered 2023-07-04: 125 ug/kg/min via INTRAVENOUS

## 2023-07-04 MED ORDER — PROPOFOL 10 MG/ML IV BOLUS
INTRAVENOUS | Status: DC | PRN
  Administered 2023-07-04: 20 mg via INTRAVENOUS
  Administered 2023-07-04: 10 mg via INTRAVENOUS

## 2023-07-04 MED ORDER — LIDOCAINE 2% (20 MG/ML) 5 ML SYRINGE
INTRAMUSCULAR | Status: DC | PRN
  Administered 2023-07-04: 100 mg via INTRAVENOUS

## 2023-07-04 MED ORDER — PROPOFOL 500 MG/50ML IV EMUL
INTRAVENOUS | Status: AC
  Filled 2023-07-04: qty 50

## 2023-07-04 MED ORDER — SODIUM CHLORIDE 0.9 % IV SOLN: INTRAVENOUS | Status: DC

## 2023-07-04 SURGICAL SUPPLY — 15 items

## 2023-07-04 NOTE — Anesthesia Preprocedure Evaluation (Signed)
Anesthesia Evaluation  Patient identified by MRN, date of birth, ID band Patient awake    Reviewed: Allergy & Precautions, H&P , NPO status , Patient's Chart, lab work & pertinent test results  Airway Mallampati: II  TM Distance: >3 FB Neck ROM: Full    Dental no notable dental hx.    Pulmonary sleep apnea and Continuous Positive Airway Pressure Ventilation , former smoker   Pulmonary exam normal breath sounds clear to auscultation       Cardiovascular hypertension, Pt. on medications Normal cardiovascular exam+ pacemaker  Rhythm:Regular Rate:Normal  1. No signficant change in LVEF from report of 2023.   2. Septal hypokinesis.. Left ventricular ejection fraction, by  estimation, is 45%. The left ventricle has mildly decreased function.  Indeterminate diastolic filling due to E-A fusion.   3. Right ventricular systolic function is normal. The right ventricular  size is normal. There is normal pulmonary artery systolic pressure. The  estimated right ventricular systolic pressure is 16.7 mmHg.   4. The mitral valve is degenerative. Mild mitral valve regurgitation.   5. The aortic valve is tricuspid. Aortic valve regurgitation is trivial.  Aortic valve sclerosis/calcification is present, without any evidence of  aortic stenosis.   6. The inferior vena cava is normal in size with greater than 50%  respiratory variability, suggesting right atrial pressure of 3 mmHg.   7. Evidence of atrial level shunting detected by color flow Doppler.  There is a small patent foramen ovale with predominantly left to right  shunting across the atrial septum.      Neuro/Psych negative neurological ROS  negative psych ROS   GI/Hepatic negative GI ROS, Neg liver ROS,,,  Endo/Other  diabetes, Type 2Hypothyroidism    Renal/GU negative Renal ROS  negative genitourinary   Musculoskeletal negative musculoskeletal ROS (+)    Abdominal    Peds negative pediatric ROS (+)  Hematology negative hematology ROS (+)   Anesthesia Other Findings   Reproductive/Obstetrics negative OB ROS                             Anesthesia Physical Anesthesia Plan  ASA: 3  Anesthesia Plan: MAC   Post-op Pain Management: Minimal or no pain anticipated   Induction: Intravenous  PONV Risk Score and Plan: 2 and Propofol infusion and Treatment may vary due to age or medical condition  Airway Management Planned: Simple Face Mask  Additional Equipment:   Intra-op Plan:   Post-operative Plan:   Informed Consent: I have reviewed the patients History and Physical, chart, labs and discussed the procedure including the risks, benefits and alternatives for the proposed anesthesia with the patient or authorized representative who has indicated his/her understanding and acceptance.     Dental advisory given  Plan Discussed with: CRNA and Surgeon  Anesthesia Plan Comments:        Anesthesia Quick Evaluation

## 2023-07-04 NOTE — Transfer of Care (Signed)
Immediate Anesthesia Transfer of Care Note  Patient: Vanessa Johnson  Procedure(s) Performed: ESOPHAGOGASTRODUODENOSCOPY (EGD) WITH PROPOFOL BIOPSY  Patient Location: PACU and Endoscopy Unit  Anesthesia Type:MAC  Level of Consciousness: drowsy and responds to stimulation  Airway & Oxygen Therapy: Patient Spontanous Breathing and Patient connected to face mask oxygen  Post-op Assessment: Report given to RN and Post -op Vital signs reviewed and stable  Post vital signs: Reviewed and stable  Last Vitals:  Vitals Value Taken Time  BP 98/58 07/04/23 1030  Temp    Pulse 60 07/04/23 1030  Resp 16 07/04/23 1030  SpO2 99 % 07/04/23 1030  Vitals shown include unfiled device data.  Last Pain:  Vitals:   07/04/23 0933  TempSrc: Temporal  PainSc: 0-No pain         Complications: No notable events documented.

## 2023-07-04 NOTE — H&P (Signed)
Assessment:    # 87 year old female with recurrent iron deficiency anemia.  This has been a recurrent problem for years.   Previously followed by GI Dr. Charm Barges.  Has also been followed by hematology and received iron infusions.  Recent decline in hemoglobin into the 7 range. Dark stools but otherwise no evidence for overt GI bleeding. She has recently gotten iron infusions and hemoglobin back up around 13 .  Hematology has referred her to [place] as Dr. Charm Barges has retired .  Dr. Chales Abrahams reviewed her records and accepted her as a patient.  She has a history of large friable gastric polyps, probably the source of iron deficiency anemia.  It appears that her last colonoscopy was back in 2016 at which time a small adenoma was removed   # Steatohepatitis.   # She did have a liver biopsy ordered by Dr. Charm Barges.  His note mentions that the results were that of steatohepatitis but I do not have the actual report hepatic serologic workup was negative for hepatitis B antigen and hepatitis B core antibody.  Hepatitis C antibody was negative.  ANA negative.  ASMA negative.  AMA negative.   # History of chronic CHF, results of echo 11/11/22 cannot be seen, "not released".  In 2021 her EF was 30 to 35%.     # History of severe MR   Plan:  -I suspect she is going to need an EGD given her history of large friable gastric polyps.  Hopefully further workup such as colonoscopy and small bowel video capsule will not be needed, especially given her comorbidities and age.  -I will need to talk with Dr. Chales Abrahams to see if he does not fact want to proceed with an EGD.  This may have to be done at the hospital depending on current status of heart failure. -For now, she has been given iron infusions and hemoglobin is in the 13 range.  -Continue PPI   HPI:    Chief Complaint: multiple issues.    Vanessa Johnson is a 87 y.o. year old female with multiple medical problems not limited to GERD , fatty liver disease, iron  deficiency anemia, chronic systolic heart failure with EF of 30 to 35%, pacemaker placement , severe MR , nonobstructive CAD, CKD stage 3,  DM2, hypothyroidism.  See PMH / PSH for additional history.    Patient is new to the practice.  Initially was unclear who referred her and the reason for the referral.  I had records from [facility] where patient was seen for diarrhea in late February.  Her stool study was positive for Campylobacter and she was treated with azithromycin.  C. difficile was negative.   During the course of our visit it later became clear that patient was actually referred by her Hematologist for evaluation of iron deficiency anemia.  Dr. Chales Abrahams had reviewed her records back in January and accepted her as a patient but I did not have those records in my hand until later in the course of our visit.  There is a packet of about 50 pages of records from patient's prior gastroenterologist Dr. Charm Barges.  Unfortunately I was unable to review all the records during this visit.      From what I can tell patient has a history of iron deficiency anemia in 2021.  She was followed by Dr. Charm Barges, GI in [place] as well as hematology.  I did see that she has had an upper  endoscopy with removal  of some large gastric polyps which could have been the source of anemia. She got 4 iron infusions in 2021. Anemia must have resolved as she did not require any further transfusions nor oral iron and her hemoglobin apparently remained stable until just recently when it was found to be in the 7 range with an,MCV of 70. She has since been back to hematology and is status post 2 iron infusions Hgb is now back into the normal range at 13.3 as of 11/11/2022   She has not had any overt GI bleeding except for minor rectal bleedng after several bouts of diarrhea during her Campylobacter infection. However, stools have been dark for several weeks. No bismuth or oral iron use. She doesn't take NSAIDs except for a baby  asa.  She takes pantoprazole 40 mg daily and pepcid as needed. She takes Investment banker, corporate everyday but still struggles with alternating constipation/ diarrhea.    Recent ultrasound (December 2023) showed mild hepatic steatosis.  No gallstones or gallbladder wall thickening.  No CBD duct dilation.  No focal liver lesions.   Previous GI history: Several records to review.  Will addend this note once everything has been reviewed from Dr. Silvana Newness office   April 2013 colonoscopy for evaluation of constipation -Good prep.  Exam was complete.  1 diminutive polyp was removed from the ascending colon.  Small external hemorrhoids Path-colon polyp was benign.  No adenomatous or malignant change.   July 2016 colonoscopy for iron deficiency. -Complete exam.  Bowel prep was good..  2 polyps ranging from 3 to 4 mm in size were removed.  No source of occult GI bleeding found.  Follow-up colonoscopy was not recommended. Path-tubular adenoma and hyperplastic polyp.   July 2016 EGD for iron deficiency and occult GI bleeding -Large gastric polyp, pedunculated measuring about 2 cm.  The surface was friable and there was oozing.  Multiple biopsies taken.  Antral gastritis.  Multiple antral polyps ranging 8 to 10 mm.  4 biopsies were taken in the antrum and body to evaluate for H. pylori.  Biopsies taken from the duodenum to evaluate for celiac. Path-duodenal biopsies negative.  Gastric biopsy showing chronic gastritis.  No H. pylori.  No intestinal metaplasia.  Large gastric polyp biopsy showed a hyperplastic gastric polyp with chronic inflammation and foci of acute ulceration.  No H. pylori.   April 2018 EGD -4 gastric polyps in the body of the stomach, 2 were about 1.5 cm and pedunculated.  Focal erythema with thickening of gastric folds in the antrum, biopsies taken. Path-gastric biopsies compatible with mild reactive gastropathy.  No H. pylori.  No intestinal metaplasia.  Gastric polyps were hyperplastic with  ulceration and exudate.   June 2020 EGD for iron deficiency anemia. -Multiple large gastric polyps which were friable and possibly the source of chronic GI blood loss.  A 2 cm gastric polyp was removed.  A 1.5 cm polyp was removed.  A third polyp which was 1 cm was removed.  The descending duodenum was normal.  Biopsies taken to rule out celiac disease.  Gastric biopsies taken to rule out H. Pylori Path-polyps were hyperplastic.  Duodenal biopsies were negative.  Gastric biopsy showed chronic gastritis with erosions and focal intestinal metaplasia.  No H. pylori     Previous Labs / Imaging::     Latest Ref Rng & Units 11/11/2022   12:26 PM 11/02/2022    1:41 PM 10/26/2022   12:00 AM  CBC  WBC 4.0 -  10.5 K/uL 6.7  8.6  8.2      Hemoglobin 12.0 - 15.0 g/dL 13.0  8.9  7.7      Hematocrit 36.0 - 46.0 % 42.0  31.9  24      Platelets 150 - 400 K/uL 274  525  535         This result is from an external source.      Recent Labs  No results found for: "LIPASE"       Latest Ref Rng & Units 11/11/2022   12:26 PM 05/05/2022    1:54 PM 01/19/2022    4:01 PM  CMP  Glucose 70 - 99 mg/dL 865  784  696   BUN 8 - 23 mg/dL 24  25  23    Creatinine 0.44 - 1.00 mg/dL 2.95  2.84  1.32   Sodium 135 - 145 mmol/L 136  138  137   Potassium 3.5 - 5.1 mmol/L 3.6  4.4  3.5   Chloride 98 - 111 mmol/L 99  99  100   CO2 22 - 32 mmol/L 27  28  25    Calcium 8.9 - 10.3 mg/dL 9.3  9.6  9.6             Past Medical History:  Diagnosis Date   Allergic rhinitis     Anemia 10/2022   Back pain      left lower back   Benign essential HTN     Biventricular cardiac pacemaker in situ 08/05/2020   BMI 34.0-34.9,adult     Bronchial asthma     Bronchitis 11/20/2018   CAD (coronary artery disease)      Heart [facility] 01/2017 [facility]   Cardiology follow-up encounter 01/26/2017    Overview:  Added automatically from request for surgery [id number]  Formatting of this note might be different from the original. Added  automatically from request for surgery [id number]   CHF (congestive heart failure) (HCC)     Cholelithiasis with cholecystitis without obstruction 12/04/2015   Diabetes mellitus (HCC)     Diabetes mellitus due to underlying condition with complication, without long-term current use of insulin (HCC) 01/15/2016    Formatting of this note might be different from the original. Added automatically from request for surgery [id number]   Diabetes mellitus without complication (HCC) 01/15/2016   Dysfunction of right eustachian tube 11/20/2018   Essential hypertension 05/17/2017   Gastritis      Dr. Rayfield Citizen- EGD 2018   GERD (gastroesophageal reflux disease)     Hyperlipidemia     Hypothyroid     Iron deficiency anemia 09/02/2020   Ischiogluteal bursitis of left side     Left bundle branch block 04/02/2020   Mixed dyslipidemia 05/17/2017   Nonischemic cardiomyopathy (HCC) 04/02/2020   Obstructive sleep apnea of adult 12/04/2015   Pre-ulcerative corn or callous     Sleep apnea      On CPAP   Tinea pedis of right foot 01/15/2016   Unsteadiness 08/16/2018    Last Assessment & Plan:  Emergency department follow-up for evaluation of vertigo. Acute onset of vertigo 08/12/2018.  CT head at that time was okay.  Vertiginous symptoms resolved that day.  She persists with some unsteadiness but it is generally improving.  She has chronic history of unsteady that generally is helped by meclizine.  Denies any hearing loss symptoms or ringing in the ears. EXAM sh   Vitamin D deficiency  Past Surgical History:  Procedure Laterality Date   BIV PACEMAKER INSERTION CRT-P N/A 04/24/2020    Procedure: BIV PACEMAKER INSERTION CRT-P;  Surgeon: Marinus Maw, MD;  Location: [facility] INVASIVE CV LAB;  Service: Cardiovascular;  Laterality: N/A;   BREAST BIOPSY   1970, 1980, and 2000   CATARACT EXTRACTION, BILATERAL   2016   CHOLECYSTECTOMY   2017   DILATION AND CURETTAGE OF UTERUS   1996   HEMORRHOIDECTOMY WITH  HEMORRHOID BANDING   1970   LEFT HEART CATH AND CORONARY ANGIOGRAPHY   01/2017    [facility]- No intervention   TEE WITHOUT CARDIOVERSION N/A 02/21/2020    Procedure: TRANSESOPHAGEAL ECHOCARDIOGRAM (TEE);  Surgeon: Lars Masson, MD;  Location: [facility] ENDOSCOPY;  Service: Cardiovascular;  Laterality: N/A;   TONSILLECTOMY   1965   TOTAL HIP ARTHROPLASTY Bilateral      left- 2005 and right- 2010             Family History  Problem Relation Age of Onset   Diabetes Mother     Heart disease Mother     Hypertension Mother     Heart attack Mother     Emphysema Father     Hypertension Father     Heart attack Father     Breast cancer Sister     Diabetes Sister     Emphysema Sister     COPD Sister     Asthma Sister     Hypertension Sister     Lung cancer Brother     Diabetes Brother     Emphysema Brother     COPD Brother     Asthma Brother     Heart disease Brother     Liver disease Neg Hx          Social History  Social History        Tobacco Use   Smoking status: Former   Smokeless tobacco: Never  Building services engineer Use: Never used  Substance Use Topics   Alcohol use: No   Drug use: No            Current Outpatient Medications  Medication Sig Dispense Refill   albuterol (PROVENTIL) (2.5 MG/3ML) 0.083% nebulizer solution Take 2.5 mg by nebulization every 6 (six) hours as needed for wheezing or shortness of breath.        aspirin EC 81 MG tablet Take 1 tablet (81 mg total) by mouth daily. 30 tablet 11   b complex vitamins tablet Take 1 tablet by mouth daily.       Calcium Carb-Cholecalciferol (CALCIUM 600 + D PO) Take 2 tablets by mouth daily.       cetirizine (ZYRTEC) 10 MG tablet Take 10 mg by mouth daily.       cyclobenzaprine (FLEXERIL) 10 MG tablet Take 10 mg by mouth at bedtime as needed for muscle spasms.        famotidine (PEPCID) 40 MG tablet Take 40 mg by mouth daily.       fluticasone (FLONASE) 50 MCG/ACT nasal spray Place 2 sprays into  both nostrils as needed for allergies or rhinitis.       furosemide (LASIX) 40 MG tablet TAKE 2 TABLETS EVERY MORNING (CHANGE IN DOSAGE OR PILL SIZE) 315 tablet 3   icosapent Ethyl (VASCEPA) 1 g capsule Take 2 capsules (2 g total) by mouth 2 (two) times daily. 120 capsule 1   JARDIANCE 10 MG TABS tablet TAKE 1 TABLET  DAILY BEFORE BREAKFAST 90 tablet 3   levalbuterol (XOPENEX HFA) 45 MCG/ACT inhaler Inhale 2 puffs into the lungs every 4 (four) hours as needed for wheezing.       meclizine (ANTIVERT) 25 MG tablet Take 25 mg by mouth every 8 (eight) hours as needed for dizziness or nausea.        metFORMIN (GLUCOPHAGE) 500 MG tablet Take 250 mg by mouth 2 (two) times daily. 1/2 tablet bid       metoprolol succinate (TOPROL-XL) 100 MG 24 hr tablet TAKE 1 TABLET TWICE A DAY (NOTE CHANGE IN DOSAGE) 135 tablet 4   montelukast (SINGULAIR) 10 MG tablet Take 10 mg by mouth at bedtime.        mupirocin ointment (BACTROBAN) 2 % Apply 1 application topically 2 (two) times daily as needed for rash (eczema).       naproxen sodium (ALEVE) 220 MG tablet Take 440 mg by mouth daily as needed for pain (pain).       nitroGLYCERIN (NITROSTAT) 0.4 MG SL tablet Place 1 tablet (0.4 mg total) under the tongue every 5 (five) minutes as needed for chest pain. 30 tablet 2   pantoprazole (PROTONIX) 40 MG tablet Take 40 mg by mouth daily.       Polyethyl Glycol-Propyl Glycol (SYSTANE OP) Place 1 drop into both eyes 4 (four) times daily.       polyethylene glycol (MIRALAX / GLYCOLAX) packet Take 17 g by mouth daily as needed for mild constipation.        rosuvastatin (CRESTOR) 20 MG tablet TAKE 1 TABLET DAILY ( CHANGE IN DOSAGE ) 90 tablet 3   spironolactone (ALDACTONE) 25 MG tablet TAKE 1 TABLET AT BEDTIME (DOSE CHANGE) 90 tablet 3   TIROSINT 75 MCG CAPS Take 1 capsule by mouth daily.       vitamin E 400 UNIT capsule Take 400 Units by mouth daily.        Wheat Dextrin (BENEFIBER) POWD Take 1 Scoop by mouth daily as needed for  constipation (constipation).          No current facility-administered medications for this visit.      Allergies       Allergies  Allergen Reactions   Ace Inhibitors Anaphylaxis   Angiotensin Receptor Blockers Anaphylaxis   Entresto [Sacubitril-Valsartan] Anaphylaxis   Tussionex Pennkinetic Er [Hydrocod Poli-Chlorphe Poli Er] Swelling and Rash      Throat swelling   Valsartan Swelling   Cephalexin Swelling      Throat swelling     Codeine Other (See Comments)      Unknown    Sulfamethoxazole Other (See Comments)      Unknown   Cefuroxime Axetil Rash   Cephalosporins Rash   Iohexol Rash   Moxifloxacin Rash      Review of Systems: All systems reviewed and negative except where noted in HPI.       Wt Readings from Last 3 Encounters:  12/24/22 155 lb (70.3 kg)  11/11/22 154 lb 12.8 oz (70.2 kg)  11/03/22 152 lb (68.9 kg)      Physical Exam:  BP (!) 140/80   Pulse 88   Ht 5\' 1"  (1.549 m)   Wt 155 lb (70.3 kg)   SpO2 98%   BMI 29.29 kg/m  Constitutional:  Pleasant, generally well appearing female in no acute distress. Psychiatric:  Normal mood and affect. Behavior is normal. EENT: Pupils normal.  Conjunctivae are normal. No scleral icterus. Neck supple.  Cardiovascular: Normal  rate, regular rhythm.  Pulmonary/chest: Effort normal and breath sounds normal. No wheezing, rales or rhonchi. Abdominal: Soft, nondistended, nontender. Bowel sounds active throughout. There are no masses palpable. No hepatomegaly. Neurological: Alert and oriented to person place and time. Skin: Skin is warm and dry. No rashes noted.   Willette Cluster, NP     Attending physician's note   I have taken history, reviewed the chart and examined the patient. I performed a substantive portion of this encounter, including complete performance of at least one of the key components, in conjunction with the APP. I agree with the Advanced Practitioner's note, impression and recommendations.   For  EGD today.  Edman Circle, MD [facility] [phone]

## 2023-07-04 NOTE — Anesthesia Postprocedure Evaluation (Signed)
Anesthesia Post Note  Patient: Vanessa Johnson  Procedure(s) Performed: ESOPHAGOGASTRODUODENOSCOPY (EGD) WITH PROPOFOL BIOPSY     Patient location during evaluation: PACU Anesthesia Type: MAC Level of consciousness: awake and alert Pain management: pain level controlled Vital Signs Assessment: post-procedure vital signs reviewed and stable Respiratory status: spontaneous breathing, nonlabored ventilation, respiratory function stable and patient connected to nasal cannula oxygen Cardiovascular status: stable and blood pressure returned to baseline Postop Assessment: no apparent nausea or vomiting Anesthetic complications: no  No notable events documented.  Last Vitals:  Vitals:   07/04/23 1030 07/04/23 1040  BP: (!) 98/58 112/64  Pulse: 60 60  Resp: 16 19  Temp: 36.4 C   SpO2: 99% 95%    Last Pain:  Vitals:   07/04/23 1040  TempSrc:   PainSc: 0-No pain                 Mihran Lebarron S

## 2023-07-04 NOTE — Anesthesia Procedure Notes (Signed)
Procedure Name: MAC Date/Time: 07/04/2023 10:08 AM  Performed by: Sindy Guadeloupe, CRNAPre-anesthesia Checklist: Patient identified, Emergency Drugs available, Suction available, Patient being monitored and Timeout performed Oxygen Delivery Method: Simple face mask Placement Confirmation: positive ETCO2

## 2023-07-04 NOTE — Discharge Instructions (Signed)
YOU HAD AN ENDOSCOPIC PROCEDURE TODAY: Refer to the procedure report and other information in the discharge instructions given to you for any specific questions about what was found during the examination. If this information does not answer your questions, please call [place] office at [phone] to clarify.   YOU SHOULD EXPECT: Some feelings of bloating in the abdomen. Passage of more gas than usual. Walking can help get rid of the air that was put into your GI tract during the procedure and reduce the bloating. If you had a lower endoscopy (such as a colonoscopy or flexible sigmoidoscopy) you may notice spotting of blood in your stool or on the toilet paper. Some abdominal soreness may be present for a day or two, also.  DIET: Your first meal following the procedure should be a light meal and then it is ok to progress to your normal diet. A half-sandwich or bowl of soup is an example of a good first meal. Heavy or fried foods are harder to digest and may make you feel nauseous or bloated. Drink plenty of fluids but you should avoid alcoholic beverages for 24 hours. If you had a esophageal dilation, please see attached instructions for diet.    ACTIVITY: Your care partner should take you home directly after the procedure. You should plan to take it easy, moving slowly for the rest of the day. You can resume normal activity the day after the procedure however YOU SHOULD NOT DRIVE, use power tools, machinery or perform tasks that involve climbing or major physical exertion for 24 hours (because of the sedation medicines used during the test).   SYMPTOMS TO REPORT IMMEDIATELY: A gastroenterologist can be reached at any hour. Please call [phone]  for any of the following symptoms:  Following lower endoscopy (colonoscopy, flexible sigmoidoscopy) Excessive amounts of blood in the stool  Significant tenderness, worsening of abdominal pains  Swelling of the abdomen that is new, acute  Fever of 100 or  higher  Following upper endoscopy (EGD, EUS, ERCP, esophageal dilation) Vomiting of blood or coffee ground material  New, significant abdominal pain  New, significant chest pain or pain under the shoulder blades  Painful or persistently difficult swallowing  New shortness of breath  Black, tarry-looking or red, bloody stools  FOLLOW UP:  If any biopsies were taken you will be contacted by phone or by letter within the next 1-3 weeks. Call [phone]  if you have not heard about the biopsies in 3 weeks.  Please also call with any specific questions about appointments or follow up tests.YOU HAD AN ENDOSCOPIC PROCEDURE TODAY: Refer to the procedure report and other information in the discharge instructions given to you for any specific questions about what was found during the examination. If this information does not answer your questions, please call [place] office at [phone] to clarify.   YOU SHOULD EXPECT: Some feelings of bloating in the abdomen. Passage of more gas than usual. Walking can help get rid of the air that was put into your GI tract during the procedure and reduce the bloating. If you had a lower endoscopy (such as a colonoscopy or flexible sigmoidoscopy) you may notice spotting of blood in your stool or on the toilet paper. Some abdominal soreness may be present for a day or two, also.  DIET: Your first meal following the procedure should be a light meal and then it is ok to progress to your normal diet. A half-sandwich or bowl of soup is an example of a   good first meal. Heavy or fried foods are harder to digest and may make you feel nauseous or bloated. Drink plenty of fluids but you should avoid alcoholic beverages for 24 hours. If you had a esophageal dilation, please see attached instructions for diet.    ACTIVITY: Your care partner should take you home directly after the procedure. You should plan to take it easy, moving slowly for the rest of the day. You can resume  normal activity the day after the procedure however YOU SHOULD NOT DRIVE, use power tools, machinery or perform tasks that involve climbing or major physical exertion for 24 hours (because of the sedation medicines used during the test).   SYMPTOMS TO REPORT IMMEDIATELY: A gastroenterologist can be reached at any hour. Please call [phone]  for any of the following symptoms:  Following lower endoscopy (colonoscopy, flexible sigmoidoscopy) Excessive amounts of blood in the stool  Significant tenderness, worsening of abdominal pains  Swelling of the abdomen that is new, acute  Fever of 100 or higher  Following upper endoscopy (EGD, EUS, ERCP, esophageal dilation) Vomiting of blood or coffee ground material  New, significant abdominal pain  New, significant chest pain or pain under the shoulder blades  Painful or persistently difficult swallowing  New shortness of breath  Black, tarry-looking or red, bloody stools  FOLLOW UP:  If any biopsies were taken you will be contacted by phone or by letter within the next 1-3 weeks. Call [phone]  if you have not heard about the biopsies in 3 weeks.  Please also call with any specific questions about appointments or follow up tests. 

## 2023-07-04 NOTE — Op Note (Addendum)
[facility] Patient Name: Vanessa Johnson Procedure Date: 07/04/2023 MRN: [id number] Attending MD: Lynann Bologna , MD, [id number] Date of Birth: [birth year] CSN: [id number] Age: 87 Admit Type: Outpatient Procedure:                Upper GI endoscopy Indications:              Iron deficiency anemia Providers:                Lynann Bologna, MD, Lorenza Evangelist, RN, Rozetta Nunnery, Technician Referring MD:              Medicines:                Monitored Anesthesia Care Complications:            No immediate complications. Estimated Blood Loss:     Estimated blood loss: none. Procedure:                Pre-Anesthesia Assessment:                           - Prior to the procedure, a History and Physical                            was performed, and patient medications and                            allergies were reviewed. The patient's tolerance of                            previous anesthesia was also reviewed. The risks                            and benefits of the procedure and the sedation                            options and risks were discussed with the patient.                            All questions were answered, and informed consent                            was obtained. Prior Anticoagulants: The patient has                            taken no anticoagulant or antiplatelet agents. ASA                            Grade Assessment: III - A patient with severe                            systemic disease. After reviewing the risks and  benefits, the patient was deemed in satisfactory                            condition to undergo the procedure.                           After obtaining informed consent, the endoscope was                            passed under direct vision. Throughout the                            procedure, the patient's blood pressure, pulse, and                            oxygen saturations  were monitored continuously. The                            [id number]-H190 ([id number]) Olympus endoscope was introduced                            through the mouth, and advanced to the second part                            of duodenum. The upper GI endoscopy was                            accomplished without difficulty. The patient                            tolerated the procedure well. Scope In: Scope Out: Findings:      Few patchy, white plaques were found in the lower third of the       esophagus. Biopsies were taken with a cold forceps for histology.      The Z-line was regular and was found 38 cm from the incisors.      A small transient hiatal hernia was present.      One non-bleeding superficial healing gastric ulcer with no stigmata of       bleeding was found in the gastric antrum above what appears to be a       antral polyp. The lesion was 6 mm in largest dimension. Multiple       biopsies were taken with a cold forceps for histology.      A few 3 to 4 mm sessile polyps with no bleeding and no stigmata of       recent bleeding were found in the gastric body. Not removed since       typical for fundic gland polyps as determined before.      The examined duodenum was normal. Biopsies for histology were taken with       a cold forceps for evaluation of celiac disease. Impression:               - Esophageal plaques were found, suspicious for  candidiasis. Biopsied.                           - Z-line regular, 38 cm from the incisors.                           - Small hiatal hernia.                           - Non-bleeding healing gastric ulcer over antral                            polyp with no stigmata of bleeding. Biopsied.                           - A few gastric polyps.                           - Normal examined duodenum. Biopsied. Moderate Sedation:      Not Applicable - Patient had care per Anesthesia. Recommendation:           - Patient has a  contact number available for                            emergencies. The signs and symptoms of potential                            delayed complications were discussed with the                            patient. Return to normal activities tomorrow.                            Written discharge instructions were provided to the                            patient.                           - Resume previous diet.                           - Continue present medications including PPIs.                           - Await pathology results.                           - Trend CBC. Her most recent hemoglobin was 13. If                            there is any further drop in hemoglobin, would                            consider gastric polypectomy-it would be a high  risk polypectomy (may need EUS before).                           - Can continue baby aspirin for now. Hold off on                            over-the-counter nonsteroidals.                           - The findings and recommendations were discussed                            with the patient's family. Procedure Code(s):        --- Professional ---                           [phone], Esophagogastroduodenoscopy, flexible,                            transoral; with biopsy, single or multiple Diagnosis Code(s):        --- Professional ---                           K22.9, Disease of esophagus, unspecified                           K44.9, Diaphragmatic hernia without obstruction or                            gangrene                           K25.9, Gastric ulcer, unspecified as acute or                            chronic, without hemorrhage or perforation                           K31.7, Polyp of stomach and duodenum                           D50.9, Iron deficiency anemia, unspecified CPT copyright 2022 American [employer] Association. All rights reserved. The codes documented in this report are preliminary and upon  coder review may  be revised to meet current compliance requirements. Lynann Bologna, MD 07/04/2023 10:32:35 AM This report has been signed electronically. Number of Addenda: 0

## 2023-07-05 LAB — SURGICAL PATHOLOGY

## 2023-07-06 NOTE — Progress Notes (Signed)
I tried calling patient several times. Then finally called patient's daughter Nigel Sloop and discussed biopsy results with her.  [place]  She will be back over the weekend.  Viviann Spare, please call her on Monday 9/16 Plan: -Diflucan 200 mg p.o. x 1 followed by 100 mg p.o. daily x 14 days -After using Flonase, rinse mouth well. -If on statins, can hold statins until Diflucan is done. -No need for letter. Send report to family physician

## 2023-07-10 ENCOUNTER — Encounter (HOSPITAL_COMMUNITY): Payer: Self-pay | Admitting: Gastroenterology

## 2023-07-11 ENCOUNTER — Other Ambulatory Visit: Payer: Self-pay

## 2023-07-11 ENCOUNTER — Ambulatory Visit: Payer: Medicare Other | Attending: Internal Medicine

## 2023-07-11 DIAGNOSIS — B3781 Candidal esophagitis: Secondary | ICD-10-CM

## 2023-07-11 DIAGNOSIS — I5022 Chronic systolic (congestive) heart failure: Secondary | ICD-10-CM

## 2023-07-11 DIAGNOSIS — Z950 Presence of cardiac pacemaker: Secondary | ICD-10-CM | POA: Diagnosis not present

## 2023-07-11 MED ORDER — FLUCONAZOLE 100 MG PO TABS
ORAL_TABLET | ORAL | 0 refills | Status: DC
Start: 2023-07-11 — End: 2023-11-14

## 2023-07-12 ENCOUNTER — Inpatient Hospital Stay: Payer: Medicare Other

## 2023-07-12 ENCOUNTER — Encounter (HOSPITAL_COMMUNITY): Payer: Medicare Other

## 2023-07-12 ENCOUNTER — Inpatient Hospital Stay: Payer: Medicare Other | Attending: Oncology | Admitting: Oncology

## 2023-07-12 VITALS — BP 128/59 | HR 80 | Temp 98.2°F | Resp 14 | Ht 61.0 in | Wt 151.4 lb

## 2023-07-12 DIAGNOSIS — D508 Other iron deficiency anemias: Secondary | ICD-10-CM | POA: Diagnosis not present

## 2023-07-12 DIAGNOSIS — D509 Iron deficiency anemia, unspecified: Secondary | ICD-10-CM | POA: Insufficient documentation

## 2023-07-12 NOTE — Progress Notes (Unsigned)
[facility]  [address] [place],  [place]  [address] [phone]  Clinic Day:  07/12/2023  Referring physician: Lucianne Lei, MD  HISTORY OF PRESENT ILLNESS:  The patient is a 87 y.o. female with a history of iron deficiency anemia.  She requested a visit today as she has gotten much weaker over the past few weeks.  The patient denies having any overt forms of blood loss since her last visit.  Of note, she is scheduled for an EGD next week  PHYSICAL EXAM:  There were no vitals taken for this visit. Wt Readings from Last 3 Encounters:  07/04/23 156 lb (70.8 kg)  06/30/23 156 lb (70.8 kg)  06/21/23 157 lb (71.2 kg)   There is no height or weight on file to calculate BMI. Performance status (ECOG): 0 - Asymptomatic Physical Exam Constitutional:      Appearance: Normal appearance.  HENT:     Mouth/Throat:     Pharynx: Oropharynx is clear. No oropharyngeal exudate.  Cardiovascular:     Rate and Rhythm: Normal rate and regular rhythm.     Heart sounds: No murmur heard.    No friction rub. No gallop.  Pulmonary:     Breath sounds: Normal breath sounds.  Abdominal:     General: Bowel sounds are normal. There is no distension.     Palpations: Abdomen is soft. There is no mass.     Tenderness: There is no abdominal tenderness.  Musculoskeletal:        General: No tenderness.     Cervical back: Normal range of motion and neck supple.     Right lower leg: No edema.     Left lower leg: No edema.  Lymphadenopathy:     Cervical: No cervical adenopathy.     Right cervical: No superficial, deep or posterior cervical adenopathy.    Left cervical: No superficial, deep or posterior cervical adenopathy.     Upper Body:     Right upper body: No supraclavicular or axillary adenopathy.     Left upper body: No supraclavicular or axillary adenopathy.     Lower Body: No right inguinal adenopathy. No left inguinal adenopathy.  Skin:    Coloration: Skin is  not jaundiced.     Findings: No lesion or rash.  Neurological:     General: No focal deficit present.     Mental Status: She is alert and oriented to person, place, and time. Mental status is at baseline.  Psychiatric:        Mood and Affect: Mood normal.        Behavior: Behavior normal.        Thought Content: Thought content normal.        Judgment: Judgment normal.    LABS:    Latest Reference Range & Units 04/08/23 11:01  WBC 4.0 - 10.5 K/uL 5.4  RBC 3.87 - 5.11 MIL/uL 4.45  Hemoglobin 12.0 - 15.0 g/dL 69.6 (L)  HCT 29.5 - 28.4 % 35.4 (L)  MCV 80.0 - 100.0 fL 79.6 (L)  MCH 26.0 - 34.0 pg 24.5 (L)  MCHC 30.0 - 36.0 g/dL 13.2  RDW 44.0 - 10.2 % 14.3  Platelets 150 - 400 K/uL 428 (H)  (L): Data is abnormally low (H): Data is abnormally high  Latest Reference Range & Units 04/08/23 11:01  Iron 28 - 170 ug/dL 34  UIBC ug/dL 725  TIBC 366 - 440 ug/dL 347 (H)  Saturation Ratios 10.4 - 31.8 %  6 (L)  Ferritin 11 - 307 ng/mL 4 (L)  (H): Data is abnormally high (L): Data is abnormally low  ASSESSMENT & PLAN:  Assessment/Plan:  An 87 y.o. female whose labs definitely show recurrent iron deficiency anemia.  Based upon this, I will arrange for her to receive another course of IV iron this week to rapidly replenish her iron stores and improve her hemoglobin.  As mentioned previously, she is scheduled for an EGD next week.  We will call her GI office to see if a colonoscopy can also be added to her GI workup next week.  Otherwise, I will see her back in 3 months to reassess her iron and hemoglobin levels to see how well she responded to her upcoming course of IV iron.  The patient understands all the plans discussed today and is in agreement with them.    Adasha Boehme Kirby Funk, MD

## 2023-07-13 ENCOUNTER — Encounter: Payer: Self-pay | Admitting: Oncology

## 2023-07-13 ENCOUNTER — Other Ambulatory Visit: Payer: Self-pay | Admitting: Oncology

## 2023-07-13 ENCOUNTER — Telehealth: Payer: Self-pay | Admitting: Oncology

## 2023-07-13 DIAGNOSIS — D509 Iron deficiency anemia, unspecified: Secondary | ICD-10-CM

## 2023-07-13 NOTE — Telephone Encounter (Signed)
Patient has been scheduled. Aware of appt date and time.   Scheduling Message Entered by Rennis Harding A on 07/13/2023 at  3:37 PM Priority: Routine <No visit type provided>  Department: [facility]  Provider:  Scheduling Notes:  Iv iron next week  Labs/appt 11-11-23

## 2023-07-14 ENCOUNTER — Telehealth (HOSPITAL_COMMUNITY): Payer: Self-pay | Admitting: Cardiology

## 2023-07-14 ENCOUNTER — Ambulatory Visit (HOSPITAL_COMMUNITY)
Admission: RE | Admit: 2023-07-14 | Discharge: 2023-07-14 | Disposition: A | Discharge status: Home / Self Care | Payer: Medicare Other | Source: Ambulatory Visit | Attending: Cardiology | Admitting: Cardiology

## 2023-07-14 ENCOUNTER — Encounter (HOSPITAL_COMMUNITY): Payer: Self-pay

## 2023-07-14 ENCOUNTER — Encounter: Payer: Self-pay | Admitting: Oncology

## 2023-07-14 VITALS — BP 138/68 | HR 74 | Wt 151.4 lb

## 2023-07-14 DIAGNOSIS — I5022 Chronic systolic (congestive) heart failure: Secondary | ICD-10-CM | POA: Insufficient documentation

## 2023-07-14 DIAGNOSIS — B3781 Candidal esophagitis: Secondary | ICD-10-CM | POA: Diagnosis not present

## 2023-07-14 DIAGNOSIS — I340 Nonrheumatic mitral (valve) insufficiency: Secondary | ICD-10-CM | POA: Insufficient documentation

## 2023-07-14 DIAGNOSIS — N183 Chronic kidney disease, stage 3 unspecified: Secondary | ICD-10-CM | POA: Diagnosis not present

## 2023-07-14 DIAGNOSIS — I428 Other cardiomyopathies: Secondary | ICD-10-CM | POA: Insufficient documentation

## 2023-07-14 DIAGNOSIS — D509 Iron deficiency anemia, unspecified: Secondary | ICD-10-CM | POA: Insufficient documentation

## 2023-07-14 DIAGNOSIS — I2510 Atherosclerotic heart disease of native coronary artery without angina pectoris: Secondary | ICD-10-CM | POA: Diagnosis not present

## 2023-07-14 DIAGNOSIS — D631 Anemia in chronic kidney disease: Secondary | ICD-10-CM | POA: Diagnosis not present

## 2023-07-14 DIAGNOSIS — I447 Left bundle-branch block, unspecified: Secondary | ICD-10-CM | POA: Insufficient documentation

## 2023-07-14 LAB — BASIC METABOLIC PANEL
Anion gap: 12 (ref 5–15)
BUN: 36 mg/dL — ABNORMAL HIGH (ref 8–23)
CO2: 26 mmol/L (ref 22–32)
Calcium: 9.8 mg/dL (ref 8.9–10.3)
Chloride: 99 mmol/L (ref 98–111)
Creatinine, Ser: 1.41 mg/dL — ABNORMAL HIGH (ref 0.44–1.00)
GFR, Estimated: 37 mL/min — ABNORMAL LOW (ref >60)
Glucose, Bld: 109 mg/dL — ABNORMAL HIGH (ref 70–99)
Potassium: 3 mmol/L — ABNORMAL LOW (ref 3.5–5.1)
Sodium: 137 mmol/L (ref 135–145)

## 2023-07-14 LAB — BRAIN NATRIURETIC PEPTIDE: B Natriuretic Peptide: 83.2 pg/mL (ref 0.0–100.0)

## 2023-07-14 MED ORDER — POTASSIUM CHLORIDE CRYS ER 10 MEQ PO TBCR: 30.0000 meq | EXTENDED_RELEASE_TABLET | Freq: Every day | ORAL | 6 refills | Status: DC

## 2023-07-14 NOTE — Progress Notes (Signed)
ReDS Vest / Clip - 07/14/23 0900       ReDS Vest / Clip   Station Marker A    Ruler Value 35    ReDS Value Range Low volume    ReDS Actual Value 21

## 2023-07-14 NOTE — Telephone Encounter (Signed)
Patient called.  Patient aware.  

## 2023-07-14 NOTE — Telephone Encounter (Signed)
-----   Message from Berger sent at 07/14/2023 12:17 PM EDT ----- K low at 3.0. start KCl 30 mEq daily and repeat BMP in 7 days.

## 2023-07-14 NOTE — Progress Notes (Signed)
EPIC Encounter for ICM Monitoring  Patient Name: Vanessa Johnson is a 87 y.o. female Date: 07/14/2023 Primary Care Physican: Lucianne Lei, MD Primary Cardiologist: Revankar/McLean Electrophysiologist: Rae Roam Pacing: >99%          06/07/2023 Weight: 153.7 lbs  06/14/2023 Weight: 156 lbs 06/23/2023 Weight: 153.5 lbs                                                            Transmission reviewed.  Pt seen in HF clinic 9/19.    Diet:   She eats a lot of restaurant foods.  She drinks <64 oz fluid daily and diet has not changed.   CorVue thoracic impedance suggesting normal fluid levels since 8/22.   Prescribed:  Furosemide 40 mg take 2 tablet(s) (80 mg total) by mouth twice a day. (Increased on 8/2) Spironolactone 25 mg take 1 tablet by mouth at bedtime.   Labs: 06/17/2023 Creatinine 1.28, BUN 37, Potassium 4.4, Sodium 138 05/27/2023 Creatinine 1.23, BUN 29, Potassium 5.2, Sodium 136, GFR 43  11/11/2022 Creatinine 1.12, BUN 24, Potassium 3.6, Sodium 136, GFR 48  A complete set of results can be found in Results Review.   Recommendations:  Recommendations given by HF clinic 9/19.    Follow-up plan: ICM clinic phone appointment on 08/15/2023.   91 day device clinic remote transmission 07/21/2023.     EP/Cardiology Office Visits: 10/13/2023 with Dr Shirlee Latch.   Copy of ICM check sent to Dr. Ladona Ridgel.   3 month ICM trend: 07/12/2023.    12-14 Month ICM trend:     Karie Soda, RN 07/14/2023 3:17 PM

## 2023-07-14 NOTE — Patient Instructions (Signed)
EKG done today.  Labs done today. We will contact you only if your labs are abnormal.  No medication changes were made. Please continue all current medications as prescribed.  Your physician recommends that you schedule a follow-up appointment in: 3 months with Dr. Shirlee Latch  If you have any questions or concerns before your next appointment please send [place] a message through [facility] or call our office at [phone].    TO LEAVE A MESSAGE FOR THE NURSE SELECT OPTION 2, PLEASE LEAVE A MESSAGE INCLUDING: YOUR NAME DATE OF BIRTH CALL BACK NUMBER REASON FOR CALL**this is important as we prioritize the call backs  YOU WILL RECEIVE A CALL BACK THE SAME DAY AS LONG AS YOU CALL BEFORE 4:00 PM   Do the following things EVERYDAY: Weigh yourself in the morning before breakfast. Write it down and keep it in a log. Take your medicines as prescribed Eat low salt foods--Limit salt (sodium) to 2000 mg per day.  Stay as active as you can everyday Limit all fluids for the day to less than 2 liters   At the [facility], you and your health needs are our priority. As part of our continuing mission to provide you with exceptional heart care, we have created designated Provider Care Teams. These Care Teams include your primary Cardiologist (physician) and Advanced Practice Providers (APPs- Physician Assistants and Nurse Practitioners) who all work together to provide you with the care you need, when you need it.   You may see any of the following providers on your designated Care Team at your next follow up: Dr Arvilla Meres Dr Marca Ancona Dr. Marcos Eke, NP Robbie Lis, [place] [facility] [place], [place] Brynda Peon, NP Karle Plumber, PharmD   Please be sure to bring in all your medications bottles to every appointment.    Thank you for choosing [facility]

## 2023-07-14 NOTE — Progress Notes (Signed)
PCP: Lucianne Lei, MD  Cardiology: Garwin Brothers, MD HF Cardiology: Dr. Shirlee Latch  87 y.o. with history of chronic systolic CHF (nonischemic cardiomyopathy), chronic LBBB, and mitral regurgitation was referred by Dr. Excell Seltzer for CHF evaluation prior to possible Mitraclip placement.   CHF was first noted in 12/20, she was admitted to the hospital in [facility] at that time.  LHC showed nonobstructive CAD.  She later followed up with Dr. Tomie China and had echo in 3/21, showing EF 35-40% with concern for severe MR.  TEE was then done in 4/21 showing EF 30-35% with septal-lateral dyssynchrony, moderate-severe functional MR, normal RV, severe TR.  She has had a LBBB on ECGs in 2021, she did not have LBBB in 2019.    Cardiac MRI in 6/21 showed moderate LV dilation, EF 27%, moderately decreased RV function with EF 31%, no LGE, probably moderate MR.   She had St Jude CRT-P device implanted in 7/21.  Echo in 9/21 showed EF up to 40% with normal RV, mild-moderate MR. Echo 3/23 showed EF 40-45%, mild LVH, mildly decreased RV systolic function, moderate MR.   Repeat echo 8/24 EF 45%, mild MR. Normal RV.   Had EGD done 9/9 for evaluation of IDA. EGD showed non bleeding, healing, gastric ulcer =>biopsied and benign, +several gastric polyps. Also found to have esophageal candidiasis and has been started on Diflucan.   She presents today for routine f/u. Reports doing fairly well. Just returned from vacation from [place]. Denies resting dyspnea. Reports stable NYHA class II symptoms. Wt stable at home. Reports full med compliance. Denies CP. ReDs 21%. Impedence good on device interrogation. BiV pacing 99%.   REDs clip 21%    St Jude device interrogation: >99% BiV pacing, no AF/VT, thoracic impedance stable at baseline.   ECG: not performed.   Labs (4/21): K 4.1, creatinine 1.16 Labs (5/21): K 3.8, creatinine 1.21 => 1.1, BNP 2424 => 2201, digoxin 0.4 Labs (6/21): K 4.6, creatinine 1.17, hgb 10.9 Labs (7/21): K  4.4, creatinine 1.11, digoxin 0.8 Labs (11/21): K 4.8, creatinine 1.22 Labs (3/22): K 4.5, creatinine 1.25, LDL 96, TGs 291 Labs (5/22): K 4.6, creatinine 1.2 Labs (6/22): LDL 104, LFTs normal Labs (12/22): K 5.2, creatinine 1.31 Labs (1/23): K 4.1, creatinine 1.08 Labs (3/23): K 3.5, creatinine 1.31, LDL 35, HDL 36 Labs (7/23): K 4.4, creatinine 1.12 Labs (1/24): hgb 8.9, LDL 32 Labs (6/24): hgb 10.9, K 3.6, creatinine 1.12 Labs (9/24): hgb 13.6, Scr 1.32, K 3.3  PMH: 1. LBBB: Chronic.  2. Anemia: Prior GI workup in [place] was negative.  No history of overt GI bleeding. Fe deficiency.  3. Type 2 diabetes 4. Hyperlipidemia 5. CAD: LHC (2020) with 40% ostial left main, 50% mid RCA.  6. Chronic systolic CHF: Noted since 12/20.  Nonischemic cardiomyopathy.  - TEE (4/21): EF 30-35%, septal-lateral dyssynchrony, moderate-severe MR appears functional, normal RV size and systolic function, severe biatrial enlargement, severe TR.  - Cardiac MRI (6/21): Moderate LV dilation, EF 27%; moderately decreased RV function with EF 31%, no LGE, probably moderate MR.  - St Jude CRT-P implanted 7/21.  - Echo (9/21): EF 40%, diffuse hypokinesis, mildly decreased RV systolic function, mild-moderate MR, normal IVC.  - Echo (3/23): EF 40-45%, mild LVH, mildly decreased RV systolic function, moderate MR. 7. GERD 8. Hyperlipidemia 9. Hypothyroidism 10. Mitral regurgitation: Suspected to be functional.  Echo in 9/21 with improved MR, appears mild-moderate.  11. Fe deficiency anemia 12. Fatty liver 13. CKD stage 3  Social History   Socioeconomic History   Marital status: Widowed    Spouse name: Not on file   Number of children: 2   Years of education: Not on file   Highest education level: Not on file  Occupational History   Occupation: retired  Tobacco Use   Smoking status: Former   Smokeless tobacco: Never  Advertising account planner   Vaping status: Never Used  Substance and Sexual Activity   Alcohol use:  No   Drug use: No   Sexual activity: Not on file  Other Topics Concern   Not on file  Social History Narrative   Not on file   Social Determinants of Health   Financial Resource Strain: Not on file  Food Insecurity: Not on file  Transportation Needs: Not on file  Physical Activity: Not on file  Stress: Not on file  Social Connections: Not on file  Intimate Partner Violence: Not on file   Family History  Problem Relation Age of Onset   Diabetes Mother    Heart disease Mother    Hypertension Mother    Heart attack Mother    Emphysema Father    Hypertension Father    Heart attack Father    Breast cancer Sister    Diabetes Sister    Emphysema Sister    COPD Sister    Asthma Sister    Hypertension Sister    Lung cancer Brother    Diabetes Brother    Emphysema Brother    COPD Brother    Asthma Brother    Heart disease Brother    Liver disease Neg Hx    Colon cancer Neg Hx    Colon polyps Neg Hx    Esophageal cancer Neg Hx    Rectal cancer Neg Hx    Stomach cancer Neg Hx    ROS: All systems reviewed and negative except as per HPI.   Current Outpatient Medications  Medication Sig Dispense Refill   albuterol (PROVENTIL) (2.5 MG/3ML) 0.083% nebulizer solution Take 2.5 mg by nebulization every 6 (six) hours as needed for wheezing or shortness of breath.      Apoaequorin (PREVAGEN) 10 MG CAPS Take 10 mg by mouth in the morning.     aspirin EC 81 MG tablet Take 1 tablet (81 mg total) by mouth daily. 30 tablet 11   b complex vitamins tablet Take 1 tablet by mouth daily after lunch.     cetirizine (ZYRTEC) 10 MG tablet Take 10 mg by mouth in the morning.     Cholecalciferol (VITAMIN D-3 PO) Take 1 tablet by mouth daily after lunch.     cyclobenzaprine (FLEXERIL) 10 MG tablet Take 10 mg by mouth at bedtime as needed for muscle spasms.      famotidine (PEPCID) 40 MG tablet Take 40 mg by mouth daily as needed for heartburn.     fluconazole (DIFLUCAN) 100 MG tablet Take 2  tablets (200 mg) on day 1 and the 1 tablet  (100 mg) for fourteen days 16 tablet 0   fluticasone (FLONASE) 50 MCG/ACT nasal spray Place 2 sprays into both nostrils as needed for allergies or rhinitis.     furosemide (LASIX) 40 MG tablet Take 2 tablets (80 mg total) by mouth 2 (two) times daily. 180 tablet 3   JARDIANCE 10 MG TABS tablet TAKE 1 TABLET DAILY BEFORE BREAKFAST 90 tablet 3   levalbuterol (XOPENEX HFA) 45 MCG/ACT inhaler Inhale 2 puffs into the lungs every 4 (four) hours as needed  for wheezing.     meclizine (ANTIVERT) 25 MG tablet Take 25 mg by mouth every 8 (eight) hours as needed for dizziness or nausea.      metFORMIN (GLUCOPHAGE) 500 MG tablet Take 250 mg by mouth 2 (two) times daily.     metoprolol succinate (TOPROL-XL) 100 MG 24 hr tablet Take 100 mg by mouth 2 (two) times daily.     montelukast (SINGULAIR) 10 MG tablet Take 10 mg by mouth at bedtime.      nitroGLYCERIN (NITROSTAT) 0.4 MG SL tablet Place 1 tablet (0.4 mg total) under the tongue every 5 (five) minutes as needed for chest pain. 30 tablet 2   Omega-3 Fatty Acids (FISH OIL) 1200 MG CAPS Take 1,200 mg by mouth in the morning and at bedtime.     omeprazole (PRILOSEC OTC) 20 MG tablet Take 1 tablet (20 mg total) by mouth daily. 28 tablet 0   Polyethyl Glycol-Propyl Glycol (SYSTANE OP) Place 1 drop into both eyes 4 (four) times daily.     polyethylene glycol (MIRALAX / GLYCOLAX) packet Take 17 g by mouth in the morning.     rosuvastatin (CRESTOR) 20 MG tablet Take 20 mg by mouth daily.     spironolactone (ALDACTONE) 25 MG tablet Take 1 tablet (25 mg total) by mouth at bedtime.     TIROSINT 75 MCG CAPS Take 75 mcg by mouth daily before breakfast.     triamcinolone cream (KENALOG) 0.1 % Apply 1 Application topically daily as needed (skin irritation/eczema).     vitamin E 400 UNIT capsule Take 400 Units by mouth daily after lunch.     Wheat Dextrin (BENEFIBER) POWD Take 1 Scoop by mouth daily as needed for constipation  (constipation/regularity).     No current facility-administered medications for this encounter.   Wt Readings from Last 3 Encounters:  07/14/23 68.7 kg (151 lb 6.4 oz)  07/12/23 68.7 kg (151 lb 6.4 oz)  07/04/23 70.8 kg (156 lb)   BP 138/68   Pulse 74   Wt 68.7 kg (151 lb 6.4 oz)   SpO2 98%   BMI 28.61 kg/m   PHYSICAL EXAM: ReDs 21%  General:  Well appearing, elderly female. No respiratory difficulty HEENT: normal Neck: supple. no JVD. Carotids 2+ bilat; no bruits. No lymphadenopathy or thyromegaly appreciated. Cor: PMI nondisplaced. Regular rate & rhythm. No rubs, gallops or murmurs. Lungs: clear Abdomen: soft, nontender, nondistended. No hepatosplenomegaly. No bruits or masses. Good bowel sounds. Extremities: no cyanosis, clubbing, rash, edema Neuro: alert & oriented x 3, cranial nerves grossly intact. moves all 4 extremities w/o difficulty. Affect pleasant.     Assessment/Plan: 1. Chronic systolic CHF: Patient has a nonischemic cardiomyopathy of uncertain etiology.  She has significant mitral regurgitation.  This is functional, and I do not think that it explains her cardiomyopathy (though mitral regurgitation likely worsens symptoms).  She has a LBBB that is new since the prior ECG in 2019, cannot rule out LBBB cardiomyopathy.  Prior myocarditis is also a consideration.  TEE in 4/21 showed EF 30-35% with prominent septal-lateral dyssynchrony.  Cardiac MRI in 6/21 showed moderate LV dilation, EF 27%, moderately decreased RV function with EF 31%, no LGE, probably moderate MR.  St Jude CRT-P device implanted in 7/21.  Echo in 9/21 with EF up to 40%, improved MR (only mild-moderate). Echo 3/23 showed EF 40-45%, mild LVH, mildly decreased RV systolic function, moderate MR. Echo 8/24 EF 45%, mild MR, normal RV  -NYHA Class II. Volume stable. Euvolemic  on exam and on device interrogation. Thoracic impedance back to baseline on Corvue.  REDS clip 21%.   - Continue Lasix, 80 mg bid.  -  Continue spironolactone 25 mg daily  - Unable to take Entresto or valsartan due to angioedema. Would not therefore try ACEI.  - Continue Toprol XL 100 mg bid.    - Continue Jardiance 10 mg daily.   - Continue monthly remote monitoring of volume status via Corvue.   - Check BMP/ BNP today  2. CAD: Nonobstructive on 2020 cath.  Denies CP  - Continue ASA 81  - On Crestor 20 daily (holding for now while on diflucan, will resume after completion of thearpy) 3. Mitral regurgitation: She has functional mitral regurgitation, moderate-severe/3+ at least on TEE in 4/21.  However, after CRT, mitral regurgitation was mild-moderate on 9/21 echo, moderate on 3/23 echo and only mild on echo 8/24.   4. Fatty liver: On Crestor.  5. CKD: Stage 3.  - Check BMP today  - Avoid NSAIDs.   6. Fe deficiency anemia:  - EGD 07/04/23 showed 1 non bleeding, healing, gastric ulcer =>biopsied and benign,+ several gastric polyps. Her most recent Hgb was up to 13. Per GI, if any further drops in Hgb, can consider gastric polypectomy, though would be high risk  - scheduled for iron infusion 9/24 7. Esophageal Candidaiasis: noted on EGD 07/04/23. GI treating w/ Diflucan x 14 days. Holding statin until completion of therapy.   F/u w/ Dr. Shirlee Latch in 3 months    Robbie Lis, PA-C   07/14/2023

## 2023-07-18 MED FILL — Ferumoxytol Inj 510 MG/17ML (30 MG/ML) (Elemental Fe): INTRAVENOUS | Qty: 17 | Status: AC

## 2023-07-19 ENCOUNTER — Inpatient Hospital Stay: Payer: Medicare Other

## 2023-07-19 VITALS — BP 132/79 | HR 70 | Temp 98.0°F | Resp 16

## 2023-07-19 DIAGNOSIS — D509 Iron deficiency anemia, unspecified: Secondary | ICD-10-CM

## 2023-07-19 MED ORDER — SODIUM CHLORIDE 0.9 % IV SOLN
510.0000 mg | Freq: Once | INTRAVENOUS | Status: AC
  Administered 2023-07-19: 510 mg via INTRAVENOUS
  Filled 2023-07-19: qty 510

## 2023-07-19 MED ORDER — SODIUM CHLORIDE 0.9 % IV SOLN: Freq: Once | INTRAVENOUS | Status: AC

## 2023-07-19 NOTE — Patient Instructions (Signed)
 Ferumoxytol Injection What is this medication? FERUMOXYTOL (FER ue MOX i tol) treats low levels of iron in your body (iron deficiency anemia). Iron is a mineral that plays an important role in making red blood cells, which carry oxygen from your lungs to the rest of your body. This medicine may be used for other purposes; ask your health care provider or pharmacist if you have questions. COMMON BRAND NAME(S): Feraheme What should I tell my care team before I take this medication? They need to know if you have any of these conditions: Anemia not caused by low iron levels High levels of iron in the blood Magnetic resonance imaging (MRI) test scheduled An unusual or allergic reaction to iron, other medications, foods, dyes, or preservatives Pregnant or trying to get pregnant Breastfeeding How should I use this medication? This medication is injected into a vein. It is given by your care team in a hospital or clinic setting. Talk to your care team the use of this medication in children. Special care may be needed. Overdosage: If you think you have taken too much of this medicine contact a poison control center or emergency room at once. NOTE: This medicine is only for you. Do not share this medicine with others. What if I miss a dose? It is important not to miss your dose. Call your care team if you are unable to keep an appointment. What may interact with this medication? Other iron products This list may not describe all possible interactions. Give your health care provider a list of all the medicines, herbs, non-prescription drugs, or dietary supplements you use. Also tell them if you smoke, drink alcohol, or use illegal drugs. Some items may interact with your medicine. What should I watch for while using this medication? Visit your care team regularly. Tell your care team if your symptoms do not start to get better or if they get worse. You may need blood work done while you are taking this  medication. You may need to follow a special diet. Talk to your care team. Foods that contain iron include: whole grains/cereals, dried fruits, beans, or peas, leafy green vegetables, and organ meats (liver, kidney). What side effects may I notice from receiving this medication? Side effects that you should report to your care team as soon as possible: Allergic reactions--skin rash, itching, hives, swelling of the face, lips, tongue, or throat Low blood pressure--dizziness, feeling faint or lightheaded, blurry vision Shortness of breath Side effects that usually do not require medical attention (report to your care team if they continue or are bothersome): Flushing Headache Joint pain Muscle pain Nausea Pain, redness, or irritation at injection site This list may not describe all possible side effects. Call your doctor for medical advice about side effects. You may report side effects to FDA at 1-800-FDA-1088. Where should I keep my medication? This medication is given in a hospital or clinic. It will not be stored at home. NOTE: This sheet is a summary. It may not cover all possible information. If you have questions about this medicine, talk to your doctor, pharmacist, or health care provider.  2024 Elsevier/Gold Standard (2023-03-18 00:00:00)

## 2023-07-21 ENCOUNTER — Ambulatory Visit (INDEPENDENT_AMBULATORY_CARE_PROVIDER_SITE_OTHER): Payer: Medicare Other

## 2023-07-21 DIAGNOSIS — I428 Other cardiomyopathies: Secondary | ICD-10-CM

## 2023-07-21 LAB — CUP PACEART REMOTE DEVICE CHECK
Battery Remaining Longevity: 60 mo
Battery Remaining Percentage: 60 %
Battery Voltage: 2.98 V
Brady Statistic AP VP Percent: 3.2 %
Brady Statistic AP VS Percent: 1 %
Brady Statistic AS VP Percent: 97 %
Brady Statistic AS VS Percent: 1 %
Brady Statistic RA Percent Paced: 3.1 %
Date Time Interrogation Session: 20240926020010
Implantable Lead Connection Status: 753985
Implantable Lead Connection Status: 753985
Implantable Lead Connection Status: 753985
Implantable Lead Implant Date: 20210701
Implantable Lead Implant Date: 20210701
Implantable Lead Implant Date: 20210701
Implantable Lead Location: 753858
Implantable Lead Location: 753859
Implantable Lead Location: 753860
Implantable Pulse Generator Implant Date: 20210701
Lead Channel Impedance Value: 1050 Ohm
Lead Channel Impedance Value: 450 Ohm
Lead Channel Impedance Value: 540 Ohm
Lead Channel Pacing Threshold Amplitude: 0.75 V
Lead Channel Pacing Threshold Amplitude: 0.75 V
Lead Channel Pacing Threshold Amplitude: 0.75 V
Lead Channel Pacing Threshold Pulse Width: 0.5 ms
Lead Channel Pacing Threshold Pulse Width: 0.5 ms
Lead Channel Pacing Threshold Pulse Width: 0.5 ms
Lead Channel Sensing Intrinsic Amplitude: 12 mV
Lead Channel Sensing Intrinsic Amplitude: 3.6 mV
Lead Channel Setting Pacing Amplitude: 2 V
Lead Channel Setting Pacing Amplitude: 2 V
Lead Channel Setting Pacing Amplitude: 2 V
Lead Channel Setting Pacing Pulse Width: 0.5 ms
Lead Channel Setting Pacing Pulse Width: 0.5 ms
Lead Channel Setting Sensing Sensitivity: 2 mV
Pulse Gen Model: 3562
Pulse Gen Serial Number: 3818336

## 2023-07-25 ENCOUNTER — Other Ambulatory Visit: Payer: Self-pay

## 2023-07-25 MED FILL — Ferumoxytol Inj 510 MG/17ML (30 MG/ML) (Elemental Fe): INTRAVENOUS | Qty: 17 | Status: AC

## 2023-07-26 ENCOUNTER — Inpatient Hospital Stay: Payer: Medicare Other | Attending: Oncology

## 2023-07-26 VITALS — BP 133/70 | HR 80 | Temp 98.0°F | Resp 18

## 2023-07-26 DIAGNOSIS — D509 Iron deficiency anemia, unspecified: Secondary | ICD-10-CM | POA: Insufficient documentation

## 2023-07-26 MED ORDER — SODIUM CHLORIDE 0.9% FLUSH: 3.0000 mL | Freq: Once | INTRAVENOUS | Status: DC | PRN

## 2023-07-26 MED ORDER — SODIUM CHLORIDE 0.9% FLUSH: 10.0000 mL | Freq: Once | INTRAVENOUS | Status: DC | PRN

## 2023-07-26 MED ORDER — ALTEPLASE 2 MG IJ SOLR: 2.0000 mg | Freq: Once | INTRAMUSCULAR | Status: DC | PRN

## 2023-07-26 MED ORDER — HEPARIN SOD (PORK) LOCK FLUSH 100 UNIT/ML IV SOLN: 500.0000 [IU] | Freq: Once | INTRAVENOUS | Status: DC | PRN

## 2023-07-26 MED ORDER — HEPARIN SOD (PORK) LOCK FLUSH 100 UNIT/ML IV SOLN: 250.0000 [IU] | Freq: Once | INTRAVENOUS | Status: DC | PRN

## 2023-07-26 MED ORDER — SODIUM CHLORIDE 0.9 % IV SOLN
510.0000 mg | Freq: Once | INTRAVENOUS | Status: AC
  Administered 2023-07-26: 510 mg via INTRAVENOUS
  Filled 2023-07-26: qty 510

## 2023-07-26 MED ORDER — SODIUM CHLORIDE 0.9 % IV SOLN: Freq: Once | INTRAVENOUS | Status: AC

## 2023-07-26 NOTE — Patient Instructions (Signed)
 Ferumoxytol Injection What is this medication? FERUMOXYTOL (FER ue MOX i tol) treats low levels of iron in your body (iron deficiency anemia). Iron is a mineral that plays an important role in making red blood cells, which carry oxygen from your lungs to the rest of your body. This medicine may be used for other purposes; ask your health care provider or pharmacist if you have questions. COMMON BRAND NAME(S): Feraheme What should I tell my care team before I take this medication? They need to know if you have any of these conditions: Anemia not caused by low iron levels High levels of iron in the blood Magnetic resonance imaging (MRI) test scheduled An unusual or allergic reaction to iron, other medications, foods, dyes, or preservatives Pregnant or trying to get pregnant Breastfeeding How should I use this medication? This medication is injected into a vein. It is given by your care team in a hospital or clinic setting. Talk to your care team the use of this medication in children. Special care may be needed. Overdosage: If you think you have taken too much of this medicine contact a poison control center or emergency room at once. NOTE: This medicine is only for you. Do not share this medicine with others. What if I miss a dose? It is important not to miss your dose. Call your care team if you are unable to keep an appointment. What may interact with this medication? Other iron products This list may not describe all possible interactions. Give your health care provider a list of all the medicines, herbs, non-prescription drugs, or dietary supplements you use. Also tell them if you smoke, drink alcohol, or use illegal drugs. Some items may interact with your medicine. What should I watch for while using this medication? Visit your care team regularly. Tell your care team if your symptoms do not start to get better or if they get worse. You may need blood work done while you are taking this  medication. You may need to follow a special diet. Talk to your care team. Foods that contain iron include: whole grains/cereals, dried fruits, beans, or peas, leafy green vegetables, and organ meats (liver, kidney). What side effects may I notice from receiving this medication? Side effects that you should report to your care team as soon as possible: Allergic reactions--skin rash, itching, hives, swelling of the face, lips, tongue, or throat Low blood pressure--dizziness, feeling faint or lightheaded, blurry vision Shortness of breath Side effects that usually do not require medical attention (report to your care team if they continue or are bothersome): Flushing Headache Joint pain Muscle pain Nausea Pain, redness, or irritation at injection site This list may not describe all possible side effects. Call your doctor for medical advice about side effects. You may report side effects to FDA at 1-800-FDA-1088. Where should I keep my medication? This medication is given in a hospital or clinic. It will not be stored at home. NOTE: This sheet is a summary. It may not cover all possible information. If you have questions about this medicine, talk to your doctor, pharmacist, or health care provider.  2024 Elsevier/Gold Standard (2023-03-18 00:00:00)

## 2023-07-27 ENCOUNTER — Telehealth (HOSPITAL_COMMUNITY): Payer: Self-pay | Admitting: Cardiology

## 2023-07-27 ENCOUNTER — Other Ambulatory Visit (HOSPITAL_COMMUNITY): Payer: Self-pay | Admitting: Cardiology

## 2023-07-27 DIAGNOSIS — I5022 Chronic systolic (congestive) heart failure: Secondary | ICD-10-CM

## 2023-07-27 NOTE — Telephone Encounter (Signed)
Pt aware and voiced understanding Reports she will report to [facility] at [facility] today 07/27/23   9/19 order still valid for bmet

## 2023-07-27 NOTE — Telephone Encounter (Signed)
Patient called to report cramps in feet and lower extreites x 3 days  Recent labs with low potassium,pt concerned if supplementation is enough since cramps have increased   Currently taking 30 meq daily 9/19 K 3.0  Please advise

## 2023-07-28 ENCOUNTER — Ambulatory Visit: Payer: TRICARE For Life (TFL) | Admitting: Oncology

## 2023-07-28 ENCOUNTER — Other Ambulatory Visit: Payer: TRICARE For Life (TFL)

## 2023-07-28 LAB — BASIC METABOLIC PANEL
BUN/Creatinine Ratio: 37 — ABNORMAL HIGH (ref 12–28)
BUN: 49 mg/dL — ABNORMAL HIGH (ref 8–27)
CO2: 24 mmol/L (ref 20–29)
Calcium: 10.7 mg/dL — ABNORMAL HIGH (ref 8.7–10.3)
Chloride: 92 mmol/L — ABNORMAL LOW (ref 96–106)
Creatinine, Ser: 1.33 mg/dL — ABNORMAL HIGH (ref 0.57–1.00)
Glucose: 130 mg/dL — ABNORMAL HIGH (ref 70–99)
Potassium: 5.7 mmol/L — ABNORMAL HIGH (ref 3.5–5.2)
Sodium: 134 mmol/L (ref 134–144)
eGFR: 39 mL/min/{1.73_m2} — ABNORMAL LOW (ref >59)

## 2023-07-29 ENCOUNTER — Telehealth (HOSPITAL_COMMUNITY): Payer: Self-pay | Admitting: Cardiology

## 2023-07-29 DIAGNOSIS — I5022 Chronic systolic (congestive) heart failure: Secondary | ICD-10-CM

## 2023-07-29 NOTE — Telephone Encounter (Signed)
-----   Message from [place] sent at 07/28/2023 10:25 AM EDT ----- K elevated at 5.7. Instruct to stop KCL supplement and return for repeat BMP on Monday.

## 2023-07-29 NOTE — Telephone Encounter (Signed)
Patient called.  Patient aware.  

## 2023-08-01 ENCOUNTER — Other Ambulatory Visit (HOSPITAL_COMMUNITY): Payer: Self-pay | Admitting: Cardiology

## 2023-08-01 DIAGNOSIS — I5022 Chronic systolic (congestive) heart failure: Secondary | ICD-10-CM

## 2023-08-01 NOTE — Addendum Note (Signed)
Addended by: Theresia Bough on: 08/01/2023 12:02 PM   Modules accepted: Orders

## 2023-08-02 ENCOUNTER — Telehealth (HOSPITAL_COMMUNITY): Payer: Self-pay

## 2023-08-02 LAB — BASIC METABOLIC PANEL
BUN/Creatinine Ratio: 31 — ABNORMAL HIGH (ref 12–28)
BUN: 39 mg/dL — ABNORMAL HIGH (ref 8–27)
CO2: 24 mmol/L (ref 20–29)
Calcium: 9.8 mg/dL (ref 8.7–10.3)
Chloride: 97 mmol/L (ref 96–106)
Creatinine, Ser: 1.27 mg/dL — ABNORMAL HIGH (ref 0.57–1.00)
Glucose: 121 mg/dL — ABNORMAL HIGH (ref 70–99)
Potassium: 4.7 mmol/L (ref 3.5–5.2)
Sodium: 137 mmol/L (ref 134–144)
eGFR: 41 mL/min/{1.73_m2} — ABNORMAL LOW (ref >59)

## 2023-08-02 NOTE — Progress Notes (Signed)
Remote pacemaker transmission.   

## 2023-08-02 NOTE — Telephone Encounter (Signed)
Spoke with patient regarding the following results. Patient made aware and patient verbalized understanding.

## 2023-08-02 NOTE — Telephone Encounter (Signed)
-----   Message from [place] sent at 08/02/2023  2:40 PM EDT ----- Labs stable. Hyperkalemia resolved. Continue to stay off of KCl

## 2023-08-05 ENCOUNTER — Telehealth: Payer: Self-pay | Admitting: Gastroenterology

## 2023-08-05 NOTE — Telephone Encounter (Signed)
Inbound call from patient, states she does not remember going over EGD results, would like to discuss results again. Please advise.

## 2023-08-05 NOTE — Telephone Encounter (Signed)
Left message for patient to call back  

## 2023-08-08 NOTE — Telephone Encounter (Signed)
Pt was questioning the results from her EGD. Chart was reviewed and noted that Dr. Chales Abrahams had called the pt daughter and discussed the results with her. Pt made aware.  Pt verbalized understanding with all questions answered.

## 2023-08-09 ENCOUNTER — Encounter: Payer: Self-pay | Admitting: Cardiology

## 2023-08-11 DIAGNOSIS — R21 Rash and other nonspecific skin eruption: Secondary | ICD-10-CM | POA: Diagnosis not present

## 2023-08-11 DIAGNOSIS — M25532 Pain in left wrist: Secondary | ICD-10-CM | POA: Diagnosis not present

## 2023-08-15 ENCOUNTER — Ambulatory Visit: Payer: Medicare Other | Attending: Internal Medicine

## 2023-08-15 DIAGNOSIS — I5022 Chronic systolic (congestive) heart failure: Secondary | ICD-10-CM | POA: Diagnosis not present

## 2023-08-15 DIAGNOSIS — Z950 Presence of cardiac pacemaker: Secondary | ICD-10-CM | POA: Diagnosis not present

## 2023-08-16 ENCOUNTER — Telehealth: Payer: Self-pay

## 2023-08-16 NOTE — Progress Notes (Signed)
EPIC Encounter for ICM Monitoring  Patient Name: Monseratt Castleman is a 87 y.o. female Date: 08/16/2023 Primary Care Physican: Lucianne Lei, MD Primary Cardiologist: Revankar/McLean Electrophysiologist: Rae Roam Pacing: >99%          06/07/2023 Weight: 153.7 lbs  06/14/2023 Weight: 156 lbs 06/23/2023 Weight: 153.5 lbs                                                           Attempted call to patient and unable to reach.  Left detailed message per DPR regarding transmission. Transmission reviewed.    Diet:  Previously discussed she eats a lot of restaurant foods.     CorVue thoracic impedance suggesting normal fluid levels since 8/22.   Prescribed:  Furosemide 40 mg take 2 tablet(s) (80 mg total) by mouth twice a day. (Increased on 8/2) Spironolactone 25 mg take 1 tablet by mouth at bedtime.   Labs: 08/01/2023 Creatinine 1.27, BUN 39, Potassium 4.7, Sodium 137, GFR 41  07/27/2023 Creatinine 1.33, BUN 49, Potassium 5.7, Sodium 134, GFR 39  07/14/2023 Creatinine 1.41, BUN 36, Potassium 3.0, Sodium 137  07/04/2023 Creatinine 1.32, BUN 46, Potassium 3.3, Sodium 138 A complete set of results can be found in Results Review.   Recommendations:  Left voice mail with ICM number and encouraged to call if experiencing any fluid symptoms.    Follow-up plan: ICM clinic phone appointment on 08/22/2023 to recheck fluid levels.   91 day device clinic remote transmission 10/20/2023.     EP/Cardiology Office Visits: 10/13/2023 with Dr Shirlee Latch.   Copy of ICM check sent to Dr. Ladona Ridgel.   Will send copy to Dr Shirlee Latch for review if patient is reached.    3 month ICM trend: 08/15/2023.    12-14 Month ICM trend:     Karie Soda, RN 08/16/2023 1:44 PM

## 2023-08-16 NOTE — Telephone Encounter (Signed)
Remote ICM transmission received.  Attempted call to patient regarding ICM remote transmission and left detailed message per DPR.  Left ICM phone number and advised to return call for any fluid symptoms or questions. Next ICM remote transmission scheduled 08/22/2023.

## 2023-08-17 ENCOUNTER — Other Ambulatory Visit (HOSPITAL_COMMUNITY): Payer: Self-pay | Admitting: Family Medicine

## 2023-08-22 ENCOUNTER — Ambulatory Visit: Payer: Medicare Other | Attending: Internal Medicine

## 2023-08-22 DIAGNOSIS — I5022 Chronic systolic (congestive) heart failure: Secondary | ICD-10-CM

## 2023-08-22 DIAGNOSIS — L84 Corns and callosities: Secondary | ICD-10-CM | POA: Diagnosis not present

## 2023-08-22 DIAGNOSIS — E119 Type 2 diabetes mellitus without complications: Secondary | ICD-10-CM | POA: Diagnosis not present

## 2023-08-22 DIAGNOSIS — Z950 Presence of cardiac pacemaker: Secondary | ICD-10-CM

## 2023-08-24 ENCOUNTER — Telehealth: Payer: Self-pay

## 2023-08-24 NOTE — Telephone Encounter (Signed)
Remote ICM transmission received.  Attempted call to patient regarding ICM remote transmission and left detailed message per DPR.  Left ICM phone number and advised to return call for any fluid symptoms or questions. Next ICM remote transmission scheduled 09/26/2023.

## 2023-08-24 NOTE — Progress Notes (Signed)
EPIC Encounter for ICM Monitoring  Patient Name: Vanessa Johnson is a 87 y.o. female Date: 08/24/2023 Primary Care Physican: Lucianne Lei, MD Primary Cardiologist: Revankar/McLean Electrophysiologist: Rae Roam Pacing: >99%          06/07/2023 Weight: 153.7 lbs  06/14/2023 Weight: 156 lbs 06/23/2023 Weight: 153.5 lbs                                                           Attempted call to patient and unable to reach.  Left detailed message per DPR regarding transmission. Transmission reviewed.    Diet:  Previously discussed she eats a lot of restaurant foods.     CorVue thoracic impedance suggesting fluid levels returned to normal 10/24.  Possible fluid accumulation from 10/7-10/22.   Prescribed:  Furosemide 40 mg take 2 tablet(s) (80 mg total) by mouth twice a day. (Increased on 8/2) Spironolactone 25 mg take 1 tablet by mouth at bedtime.   Labs: 08/01/2023 Creatinine 1.27, BUN 39, Potassium 4.7, Sodium 137, GFR 41  07/27/2023 Creatinine 1.33, BUN 49, Potassium 5.7, Sodium 134, GFR 39  07/14/2023 Creatinine 1.41, BUN 36, Potassium 3.0, Sodium 137  07/04/2023 Creatinine 1.32, BUN 46, Potassium 3.3, Sodium 138 A complete set of results can be found in Results Review.   Recommendations:  Left voice mail with ICM number and encouraged to call if experiencing any fluid symptoms.   Follow-up plan: ICM clinic phone appointment on 09/26/2023.   91 day device clinic remote transmission 10/20/2023.     EP/Cardiology Office Visits: 10/13/2023 with Dr Shirlee Latch.   Copy of ICM check sent to Dr. Ladona Ridgel.   3 month ICM trend: 08/22/2023.    12-14 Month ICM trend:     Karie Soda, RN 08/24/2023 10:17 AM

## 2023-08-29 DIAGNOSIS — R070 Pain in throat: Secondary | ICD-10-CM | POA: Diagnosis not present

## 2023-08-29 DIAGNOSIS — H6123 Impacted cerumen, bilateral: Secondary | ICD-10-CM | POA: Diagnosis not present

## 2023-08-29 DIAGNOSIS — H6691 Otitis media, unspecified, right ear: Secondary | ICD-10-CM | POA: Diagnosis not present

## 2023-09-06 DIAGNOSIS — E785 Hyperlipidemia, unspecified: Secondary | ICD-10-CM | POA: Diagnosis not present

## 2023-09-06 DIAGNOSIS — M545 Low back pain, unspecified: Secondary | ICD-10-CM | POA: Diagnosis not present

## 2023-09-06 DIAGNOSIS — Z79899 Other long term (current) drug therapy: Secondary | ICD-10-CM | POA: Diagnosis not present

## 2023-09-06 DIAGNOSIS — J309 Allergic rhinitis, unspecified: Secondary | ICD-10-CM | POA: Diagnosis not present

## 2023-09-06 DIAGNOSIS — E559 Vitamin D deficiency, unspecified: Secondary | ICD-10-CM | POA: Diagnosis not present

## 2023-09-06 DIAGNOSIS — Z950 Presence of cardiac pacemaker: Secondary | ICD-10-CM | POA: Diagnosis not present

## 2023-09-06 DIAGNOSIS — I10 Essential (primary) hypertension: Secondary | ICD-10-CM | POA: Diagnosis not present

## 2023-09-06 DIAGNOSIS — R413 Other amnesia: Secondary | ICD-10-CM | POA: Diagnosis not present

## 2023-09-06 DIAGNOSIS — E1140 Type 2 diabetes mellitus with diabetic neuropathy, unspecified: Secondary | ICD-10-CM | POA: Diagnosis not present

## 2023-09-06 DIAGNOSIS — J45909 Unspecified asthma, uncomplicated: Secondary | ICD-10-CM | POA: Diagnosis not present

## 2023-09-06 DIAGNOSIS — D509 Iron deficiency anemia, unspecified: Secondary | ICD-10-CM | POA: Diagnosis not present

## 2023-09-06 DIAGNOSIS — Z8679 Personal history of other diseases of the circulatory system: Secondary | ICD-10-CM | POA: Diagnosis not present

## 2023-09-06 DIAGNOSIS — E039 Hypothyroidism, unspecified: Secondary | ICD-10-CM | POA: Diagnosis not present

## 2023-09-19 ENCOUNTER — Other Ambulatory Visit (HOSPITAL_COMMUNITY): Payer: Self-pay | Admitting: Cardiology

## 2023-09-26 ENCOUNTER — Ambulatory Visit: Payer: Medicare Other | Attending: Internal Medicine

## 2023-09-26 DIAGNOSIS — I5022 Chronic systolic (congestive) heart failure: Secondary | ICD-10-CM

## 2023-09-26 DIAGNOSIS — Z950 Presence of cardiac pacemaker: Secondary | ICD-10-CM

## 2023-09-28 NOTE — Progress Notes (Signed)
EPIC Encounter for ICM Monitoring  Patient Name: Vanessa Johnson is a 87 y.o. female Date: 09/28/2023 Primary Care Physican: Lucianne Lei, MD Primary Cardiologist: Revankar/McLean Electrophysiologist: Rae Roam Pacing: >99%          06/07/2023 Weight: 153.7 lbs  06/14/2023 Weight: 156 lbs 06/23/2023 Weight: 153.5 lbs   09/28/2023 Weight: 151-154 lbs                                                         Spoke with patient and heart failure questions reviewed.  Transmission results reviewed.  Pt asymptomatic for fluid accumulation.  Reports feeling well at this time and voices no complaints.     Diet:  Previously discussed she eats a lot of restaurant foods.     CorVue thoracic impedance suggesting normal fluid levels.   Prescribed:  Furosemide 40 mg take 2 tablet(s) (80 mg total) by mouth twice a day. (Increased on 8/2) Spironolactone 25 mg take 1 tablet by mouth at bedtime.   Labs: 08/01/2023 Creatinine 1.27, BUN 39, Potassium 4.7, Sodium 137, GFR 41  07/27/2023 Creatinine 1.33, BUN 49, Potassium 5.7, Sodium 134, GFR 39  07/14/2023 Creatinine 1.41, BUN 36, Potassium 3.0, Sodium 137  07/04/2023 Creatinine 1.32, BUN 46, Potassium 3.3, Sodium 138 A complete set of results can be found in Results Review.   Recommendations:  No changes and encouraged to call if experiencing any fluid symptoms.   Follow-up plan: ICM clinic phone appointment on 10/31/2023.   91 day device clinic remote transmission 10/20/2023.     EP/Cardiology Office Visits: 10/13/2023 with Dr Shirlee Latch.   Copy of ICM check sent to Dr. Ladona Ridgel.   3 month ICM trend: 09/26/2023.    12-14 Month ICM trend:     Karie Soda, RN 09/28/2023 3:12 PM

## 2023-10-13 ENCOUNTER — Encounter (HOSPITAL_COMMUNITY): Payer: Medicare Other | Admitting: Cardiology

## 2023-10-20 ENCOUNTER — Ambulatory Visit (INDEPENDENT_AMBULATORY_CARE_PROVIDER_SITE_OTHER): Payer: Medicare Other

## 2023-10-20 DIAGNOSIS — I428 Other cardiomyopathies: Secondary | ICD-10-CM

## 2023-10-21 LAB — CUP PACEART REMOTE DEVICE CHECK
Battery Remaining Longevity: 55 mo
Battery Remaining Percentage: 57 %
Battery Voltage: 2.98 V
Brady Statistic AP VP Percent: 2.8 %
Brady Statistic AP VS Percent: 1 %
Brady Statistic AS VP Percent: 96 %
Brady Statistic AS VS Percent: 1 %
Brady Statistic RA Percent Paced: 2.1 %
Date Time Interrogation Session: 20241226020011
Implantable Lead Connection Status: 753985
Implantable Lead Connection Status: 753985
Implantable Lead Connection Status: 753985
Implantable Lead Implant Date: 20210701
Implantable Lead Implant Date: 20210701
Implantable Lead Implant Date: 20210701
Implantable Lead Location: 753858
Implantable Lead Location: 753859
Implantable Lead Location: 753860
Implantable Pulse Generator Implant Date: 20210701
Lead Channel Impedance Value: 450 Ohm
Lead Channel Impedance Value: 490 Ohm
Lead Channel Impedance Value: 790 Ohm
Lead Channel Pacing Threshold Amplitude: 0.75 V
Lead Channel Pacing Threshold Amplitude: 0.75 V
Lead Channel Pacing Threshold Amplitude: 0.75 V
Lead Channel Pacing Threshold Pulse Width: 0.5 ms
Lead Channel Pacing Threshold Pulse Width: 0.5 ms
Lead Channel Pacing Threshold Pulse Width: 0.5 ms
Lead Channel Sensing Intrinsic Amplitude: 12 mV
Lead Channel Sensing Intrinsic Amplitude: 2.9 mV
Lead Channel Setting Pacing Amplitude: 2 V
Lead Channel Setting Pacing Amplitude: 2 V
Lead Channel Setting Pacing Amplitude: 2 V
Lead Channel Setting Pacing Pulse Width: 0.5 ms
Lead Channel Setting Pacing Pulse Width: 0.5 ms
Lead Channel Setting Sensing Sensitivity: 2 mV
Pulse Gen Model: 3562
Pulse Gen Serial Number: 3818336

## 2023-10-31 ENCOUNTER — Ambulatory Visit: Payer: Medicare Other | Attending: Internal Medicine

## 2023-10-31 DIAGNOSIS — Z950 Presence of cardiac pacemaker: Secondary | ICD-10-CM

## 2023-10-31 DIAGNOSIS — R911 Solitary pulmonary nodule: Secondary | ICD-10-CM | POA: Diagnosis not present

## 2023-10-31 DIAGNOSIS — J4520 Mild intermittent asthma, uncomplicated: Secondary | ICD-10-CM | POA: Diagnosis not present

## 2023-10-31 DIAGNOSIS — I5022 Chronic systolic (congestive) heart failure: Secondary | ICD-10-CM | POA: Diagnosis not present

## 2023-10-31 DIAGNOSIS — J310 Chronic rhinitis: Secondary | ICD-10-CM | POA: Diagnosis not present

## 2023-10-31 DIAGNOSIS — G4733 Obstructive sleep apnea (adult) (pediatric): Secondary | ICD-10-CM | POA: Diagnosis not present

## 2023-11-02 NOTE — Progress Notes (Signed)
 EPIC Encounter for ICM Monitoring  Patient Name: Reka Wist is a 88 y.o. female Date: 11/02/2023 Primary Care Physican: Gable Cambric, MD Primary Cardiologist: Revankar/McLean Electrophysiologist: Waddell Pore Pacing: 99%          06/07/2023 Weight: 153.7 lbs  06/14/2023 Weight: 156 lbs 06/23/2023 Weight: 153.5 lbs   09/28/2023 Weight: 151-154 lbs  11/02/2023 Weight: 156 lbs                                                        Spoke with patient and heart failure questions reviewed.  Transmission results reviewed.  Pt asymptomatic for fluid accumulation.  Reports feeling well at this time and voices no complaints.     Diet:  Previously discussed she eats a lot of restaurant foods.     CorVue thoracic impedance suggesting normal fluid levels with the exception of possible fluid accumulation from 12/11-12/17.   Prescribed:  Furosemide  40 mg take 2 tablet(s) (80 mg total) by mouth twice a day. (Increased on 8/2) Spironolactone  25 mg take 1 tablet by mouth at bedtime.   Labs: 08/01/2023 Creatinine 1.27, BUN 39, Potassium 4.7, Sodium 137, GFR 41  07/27/2023 Creatinine 1.33, BUN 49, Potassium 5.7, Sodium 134, GFR 39  07/14/2023 Creatinine 1.41, BUN 36, Potassium 3.0, Sodium 137  07/04/2023 Creatinine 1.32, BUN 46, Potassium 3.3, Sodium 138 A complete set of results can be found in Results Review.   Recommendations:  No changes and encouraged to call if experiencing any fluid symptoms.   Follow-up plan: ICM clinic phone appointment on 12/05/2023.   91 day device clinic remote transmission 01/19/2024.     EP/Cardiology Office Visits: 11/14/2023 with Dr Rolan.   Copy of ICM check sent to Dr. Waddell.   3 month ICM trend: 10/31/2023.    12-14 Month ICM trend:     [name], RN 11/02/2023 4:14 PM

## 2023-11-10 DIAGNOSIS — S2341XA Sprain of ribs, initial encounter: Secondary | ICD-10-CM | POA: Diagnosis not present

## 2023-11-11 ENCOUNTER — Other Ambulatory Visit: Payer: Self-pay | Admitting: Oncology

## 2023-11-11 ENCOUNTER — Telehealth: Payer: Self-pay

## 2023-11-11 ENCOUNTER — Inpatient Hospital Stay: Payer: Medicare Other | Attending: Oncology

## 2023-11-11 ENCOUNTER — Inpatient Hospital Stay: Payer: Medicare Other | Attending: Oncology | Admitting: Oncology

## 2023-11-11 VITALS — BP 174/62 | HR 84 | Temp 98.2°F | Resp 16 | Ht 61.0 in | Wt 158.1 lb

## 2023-11-11 DIAGNOSIS — D509 Iron deficiency anemia, unspecified: Secondary | ICD-10-CM | POA: Insufficient documentation

## 2023-11-11 DIAGNOSIS — D500 Iron deficiency anemia secondary to blood loss (chronic): Secondary | ICD-10-CM

## 2023-11-11 LAB — CBC WITH DIFFERENTIAL (CANCER CENTER ONLY)
Abs Immature Granulocytes: 0.02 10*3/uL (ref 0.00–0.07)
Basophils Absolute: 0.1 10*3/uL (ref 0.0–0.1)
Basophils Relative: 1 %
Eosinophils Absolute: 0.1 10*3/uL (ref 0.0–0.5)
Eosinophils Relative: 2 %
HCT: 35.5 % — ABNORMAL LOW (ref 36.0–46.0)
Hemoglobin: 11.7 g/dL — ABNORMAL LOW (ref 12.0–15.0)
Immature Granulocytes: 0 %
Lymphocytes Relative: 26 %
Lymphs Abs: 1.9 10*3/uL (ref 0.7–4.0)
MCH: 27.6 pg (ref 26.0–34.0)
MCHC: 33 g/dL (ref 30.0–36.0)
MCV: 83.7 fL (ref 80.0–100.0)
Monocytes Absolute: 0.6 10*3/uL (ref 0.1–1.0)
Monocytes Relative: 9 %
Neutro Abs: 4.3 10*3/uL (ref 1.7–7.7)
Neutrophils Relative %: 62 %
Platelet Count: 360 10*3/uL (ref 150–400)
RBC: 4.24 MIL/uL (ref 3.87–5.11)
RDW: 14.5 % (ref 11.5–15.5)
WBC Count: 7 10*3/uL (ref 4.0–10.5)
nRBC: 0 % (ref 0.0–0.2)
nRBC: 0 /100{WBCs}

## 2023-11-11 LAB — CMP (CANCER CENTER ONLY)
ALT: 15 U/L (ref 0–44)
AST: 20 U/L (ref 15–41)
Albumin: 4.5 g/dL (ref 3.5–5.0)
Alkaline Phosphatase: 109 U/L (ref 38–126)
Anion gap: 15 (ref 5–15)
BUN: 26 mg/dL — ABNORMAL HIGH (ref 8–23)
CO2: 26 mmol/L (ref 22–32)
Calcium: 9.6 mg/dL (ref 8.9–10.3)
Chloride: 99 mmol/L (ref 98–111)
Creatinine: 1.17 mg/dL — ABNORMAL HIGH (ref 0.44–1.00)
GFR, Estimated: 46 mL/min — ABNORMAL LOW (ref >60)
Glucose, Bld: 119 mg/dL — ABNORMAL HIGH (ref 70–99)
Potassium: 3.4 mmol/L — ABNORMAL LOW (ref 3.5–5.1)
Sodium: 140 mmol/L (ref 135–145)
Total Bilirubin: 0.4 mg/dL (ref 0.0–1.2)
Total Protein: 7.2 g/dL (ref 6.5–8.1)

## 2023-11-11 LAB — FERRITIN: Ferritin: 8 ng/mL — ABNORMAL LOW (ref 11–307)

## 2023-11-11 LAB — IRON AND TIBC
Iron: 37 ug/dL (ref 28–170)
Saturation Ratios: 7 % — ABNORMAL LOW (ref 10.4–31.8)
TIBC: 521 ug/dL — ABNORMAL HIGH (ref 250–450)
UIBC: 484 ug/dL

## 2023-11-11 NOTE — Telephone Encounter (Signed)
Iron panel testing is still in process @ 1614 on 11/11/23.

## 2023-11-12 ENCOUNTER — Encounter: Payer: Self-pay | Admitting: Oncology

## 2023-11-12 NOTE — Progress Notes (Signed)
[facility]  [address] [place],  [place]  [address] [phone]  Clinic Day:  11/11/2023  Referring physician: Lucianne Lei, MD   HISTORY OF PRESENT ILLNESS:  The patient is a 88 y.o. female with a history of iron deficiency anemia.  She comes in today to reassess her iron and hemoglobin levels after receiving another course of IV iron in early October 2024.  Since her last visit, the past has been doing well.  She denies having increased or any overt forms of blood loss which concern her for persistent iron deficiency anemia.      PHYSICAL EXAM:  Blood pressure (!) 174/62, pulse 84, temperature 98.2 F (36.8 C), temperature source Oral, resp. rate 16, height 5\' 1"  (1.549 m), weight 158 lb 1.6 oz (71.7 kg), SpO2 96%. Wt Readings from Last 3 Encounters:  11/11/23 158 lb 1.6 oz (71.7 kg)  07/14/23 151 lb 6.4 oz (68.7 kg)  07/12/23 151 lb 6.4 oz (68.7 kg)   Body mass index is 29.87 kg/m. Performance status (ECOG): 1 - Symptomatic but completely ambulatory Physical Exam Constitutional:      Appearance: Normal appearance. She is not ill-appearing.  HENT:     Mouth/Throat:     Mouth: Mucous membranes are moist.     Pharynx: Oropharynx is clear. No oropharyngeal exudate or posterior oropharyngeal erythema.  Cardiovascular:     Rate and Rhythm: Normal rate and regular rhythm.     Heart sounds: No murmur heard.    No friction rub. No gallop.  Pulmonary:     Effort: Pulmonary effort is normal. No respiratory distress.     Breath sounds: Normal breath sounds. No wheezing, rhonchi or rales.  Abdominal:     General: Bowel sounds are normal. There is no distension.     Palpations: Abdomen is soft. There is no mass.     Tenderness: There is no abdominal tenderness.  Musculoskeletal:        General: No swelling.     Right lower leg: No edema.     Left lower leg: No edema.  Lymphadenopathy:     Cervical: No cervical adenopathy.     Upper Body:      Right upper body: No supraclavicular or axillary adenopathy.     Left upper body: No supraclavicular or axillary adenopathy.     Lower Body: No right inguinal adenopathy. No left inguinal adenopathy.  Skin:    General: Skin is warm.     Coloration: Skin is not jaundiced.     Findings: No lesion or rash.  Neurological:     General: No focal deficit present.     Mental Status: She is alert and oriented to person, place, and time. Mental status is at baseline.  Psychiatric:        Mood and Affect: Mood normal.        Behavior: Behavior normal.        Thought Content: Thought content normal.     LABS:      Latest Ref Rng & Units 11/11/2023    1:32 PM 07/04/2023    9:37 AM 05/27/2023    9:40 AM  CBC  WBC 4.0 - 10.5 K/uL 7.0  8.6  7.0   Hemoglobin 12.0 - 15.0 g/dL 91.4  78.2  95.6   Hematocrit 36.0 - 46.0 % 35.5  42.3  41.7   Platelets 150 - 400 K/uL 360  371  332       Latest  Ref Rng & Units 11/11/2023    1:32 PM 08/01/2023   12:07 PM 07/27/2023    1:16 PM  CMP  Glucose 70 - 99 mg/dL 161  096  045   BUN 8 - 23 mg/dL 26  39  49   Creatinine 0.44 - 1.00 mg/dL 4.09  8.11  9.14   Sodium 135 - 145 mmol/L 140  137  134   Potassium 3.5 - 5.1 mmol/L 3.4  4.7  5.7   Chloride 98 - 111 mmol/L 99  97  92   CO2 22 - 32 mmol/L 26  24  24    Calcium 8.9 - 10.3 mg/dL 9.6  9.8  78.2   Total Protein 6.5 - 8.1 g/dL 7.2     Total Bilirubin 0.0 - 1.2 mg/dL 0.4     Alkaline Phos 38 - 126 U/L 109     AST 15 - 41 U/L 20     ALT 0 - 44 U/L 15       Latest Reference Range & Units 11/11/23 13:33  Iron 28 - 170 ug/dL 37  UIBC ug/dL 956  TIBC 213 - 086 ug/dL 578 (H)  Saturation Ratios 10.4 - 31.8 % 7 (L)  Ferritin 11 - 307 ng/mL 8 (L)  (H): Data is abnormally high (L): Data is abnormally low  ASSESSMENT & PLAN:  Assessment/Plan:  A 88 y.o. female with iron deficiency anemia.  Despite receiving IV iron in October 2024, her hemoglobin is even lower than what it was previously.  Furthermore, her iron  parameters remain extremely low.  In addition to giving her another course of IV iron, I will arrange for her to see GI as I am concerned some form of GI blood loss is still present.  Otherwise, I will see her back in 4 months for repeat clinical assessment.  The patient understands all the plans discussed today and is in agreement with them.    Melvin Marmo Kirby Funk, MD

## 2023-11-14 ENCOUNTER — Encounter (HOSPITAL_COMMUNITY): Payer: Self-pay | Admitting: Cardiology

## 2023-11-14 ENCOUNTER — Encounter: Payer: Self-pay | Admitting: Oncology

## 2023-11-14 ENCOUNTER — Telehealth: Payer: Self-pay | Admitting: Oncology

## 2023-11-14 ENCOUNTER — Ambulatory Visit (HOSPITAL_COMMUNITY)
Admission: RE | Admit: 2023-11-14 | Discharge: 2023-11-14 | Disposition: A | Discharge status: Home / Self Care | Payer: Medicare Other | Source: Ambulatory Visit | Attending: Cardiology | Admitting: Cardiology

## 2023-11-14 VITALS — BP 160/80 | HR 76 | Wt 155.6 lb

## 2023-11-14 DIAGNOSIS — I447 Left bundle-branch block, unspecified: Secondary | ICD-10-CM | POA: Diagnosis not present

## 2023-11-14 DIAGNOSIS — K760 Fatty (change of) liver, not elsewhere classified: Secondary | ICD-10-CM | POA: Insufficient documentation

## 2023-11-14 DIAGNOSIS — I428 Other cardiomyopathies: Secondary | ICD-10-CM | POA: Diagnosis not present

## 2023-11-14 DIAGNOSIS — E1122 Type 2 diabetes mellitus with diabetic chronic kidney disease: Secondary | ICD-10-CM | POA: Diagnosis not present

## 2023-11-14 DIAGNOSIS — Z7984 Long term (current) use of oral hypoglycemic drugs: Secondary | ICD-10-CM | POA: Diagnosis not present

## 2023-11-14 DIAGNOSIS — I5022 Chronic systolic (congestive) heart failure: Secondary | ICD-10-CM | POA: Insufficient documentation

## 2023-11-14 DIAGNOSIS — Z79899 Other long term (current) drug therapy: Secondary | ICD-10-CM | POA: Diagnosis not present

## 2023-11-14 DIAGNOSIS — R030 Elevated blood-pressure reading, without diagnosis of hypertension: Secondary | ICD-10-CM | POA: Insufficient documentation

## 2023-11-14 DIAGNOSIS — D509 Iron deficiency anemia, unspecified: Secondary | ICD-10-CM | POA: Diagnosis not present

## 2023-11-14 DIAGNOSIS — I2510 Atherosclerotic heart disease of native coronary artery without angina pectoris: Secondary | ICD-10-CM | POA: Diagnosis not present

## 2023-11-14 DIAGNOSIS — N183 Chronic kidney disease, stage 3 unspecified: Secondary | ICD-10-CM | POA: Diagnosis not present

## 2023-11-14 DIAGNOSIS — I081 Rheumatic disorders of both mitral and tricuspid valves: Secondary | ICD-10-CM | POA: Diagnosis not present

## 2023-11-14 MED ORDER — SPIRONOLACTONE 25 MG PO TABS: 50.0000 mg | ORAL_TABLET | Freq: Every evening | ORAL | 3 refills | Status: DC

## 2023-11-14 NOTE — Telephone Encounter (Signed)
  Latest Reference Range & Units 11/11/23 13:33   Iron 28 - 170 ug/dL 37  UIBC ug/dL 409  TIBC 811 - 914 ug/dL 782 (H)  Saturation Ratios 10.4 - 31.8 % 7 (L)  Ferritin 11 - 307 ng/mL 8 (L)  (H): Data is abnormally high (L): Data is abnormally low   ASSESSMENT & PLAN:  Assessment/Plan:  A 88 y.o. female with iron deficiency anemia.  Despite receiving IV iron in October 2024, her hemoglobin is even lower than what it was previously.  Furthermore, her iron parameters remain extremely low.  In addition to giving her another course of IV iron, I will arrange for her to see GI as I am concerned some form of GI blood loss is still present.  Otherwise, I will see her back in 4 months for repeat clinical assessment.  The patient understands all the plans discussed today and is in agreement with them.     Dequincy Kirby Funk, MD

## 2023-11-14 NOTE — Patient Instructions (Signed)
NCREASE spironolactone to 50 mg ( 2 Tab) nightly.  Blood work in 10 days.  Your physician recommends that you schedule a follow-up appointment in: 4 months.  If you have any questions or concerns before your next appointment please send [place] a message through [place] or call our office at [phone].    TO LEAVE A MESSAGE FOR THE NURSE SELECT OPTION 2, PLEASE LEAVE A MESSAGE INCLUDING: YOUR NAME DATE OF BIRTH CALL BACK NUMBER REASON FOR CALL**this is important as we prioritize the call backs  YOU WILL RECEIVE A CALL BACK THE SAME DAY AS LONG AS YOU CALL BEFORE 4:00 PM  At the [facility], you and your health needs are our priority. As part of our continuing mission to provide you with exceptional heart care, we have created designated Provider Care Teams. These Care Teams include your primary Cardiologist (physician) and Advanced Practice Providers (APPs- Physician Assistants and Nurse Practitioners) who all work together to provide you with the care you need, when you need it.   You may see any of the following providers on your designated Care Team at your next follow up: Dr Arvilla Meres Dr Marca Ancona Dr. Dorthula Nettles Dr. Clearnce Hasten Amy Filbert Schilder, NP Robbie Lis, [place] [facility] [place], [place] Brynda Peon, NP [place] Lee, NP Karle Plumber, PharmD   Please be sure to bring in all your medications bottles to every appointment.    Thank you for choosing [facility]

## 2023-11-14 NOTE — Progress Notes (Signed)
PCP: Lucianne Lei, MD  Cardiology: Garwin Brothers, MD HF Cardiology: Dr. Shirlee Latch  88 y.o. with history of chronic systolic CHF (nonischemic cardiomyopathy), chronic LBBB, and mitral regurgitation was referred by Dr. Excell Seltzer for CHF evaluation prior to possible Mitraclip placement.   CHF was first noted in 12/20, she was admitted to the hospital in [facility] at that time.  LHC showed nonobstructive CAD.  She later followed up with Dr. Tomie China and had echo in 3/21, showing EF 35-40% with concern for severe MR.  TEE was then done in 4/21 showing EF 30-35% with septal-lateral dyssynchrony, moderate-severe functional MR, normal RV, severe TR.  She has had a LBBB on ECGs in 2021, she did not have LBBB in 2019.    Cardiac MRI in 6/21 showed moderate LV dilation, EF 27%, moderately decreased RV function with EF 31%, no LGE, probably moderate MR.   She had St Jude CRT-P device implanted in 7/21.  Echo in 9/21 showed EF up to 40% with normal RV, mild-moderate MR. Echo 3/23 showed EF 40-45%, mild LVH, mildly decreased RV systolic function, moderate MR.   Repeat echo 8/24 EF 45%, mild MR. Normal RV.   Had EGD done 9/24 for evaluation of IDA. EGD showed non bleeding, healing, gastric ulcer =>biopsied and benign, +several gastric polyps. Also found to have esophageal candidiasis and has been started on Diflucan.   She presents today for followup of CHF.  Patient has been fatigued, she attributes this to iron deficiency, she will have an iron infusion soon and will be getting a colonoscopy.  No dyspnea walking on flat ground, mild dyspnea with stairs and inclines. No chest pain, no lightheadedness.   St Jude device interrogation: 99% BiV pacing, no AF/VT, thoracic impedance stable.   ECG (personally reviewed): NSR, BiV paced  Labs (4/21): K 4.1, creatinine 1.16 Labs (5/21): K 3.8, creatinine 1.21 => 1.1, BNP 2424 => 2201, digoxin 0.4 Labs (6/21): K 4.6, creatinine 1.17, hgb 10.9 Labs (7/21): K 4.4,  creatinine 1.11, digoxin 0.8 Labs (11/21): K 4.8, creatinine 1.22 Labs (3/22): K 4.5, creatinine 1.25, LDL 96, TGs 291 Labs (5/22): K 4.6, creatinine 1.2 Labs (6/22): LDL 104, LFTs normal Labs (12/22): K 5.2, creatinine 1.31 Labs (1/23): K 4.1, creatinine 1.08 Labs (3/23): K 3.5, creatinine 1.31, LDL 35, HDL 36 Labs (7/23): K 4.4, creatinine 1.12 Labs (1/24): hgb 8.9, LDL 32 Labs (6/24): hgb 10.9, K 3.6, creatinine 1.12 Labs (9/24): hgb 13.6, Scr 1.32, K 3.3 Labs (1/25): K 3.4, creatinine 1.17, hgb 11.7  PMH: 1. LBBB: Chronic.  2. Anemia: Prior GI workup in [place] was negative.  No history of overt GI bleeding. Fe deficiency.  3. Type 2 diabetes 4. Hyperlipidemia 5. CAD: LHC (2020) with 40% ostial left main, 50% mid RCA.  6. Chronic systolic CHF: Noted since 12/20.  Nonischemic cardiomyopathy.  - TEE (4/21): EF 30-35%, septal-lateral dyssynchrony, moderate-severe MR appears functional, normal RV size and systolic function, severe biatrial enlargement, severe TR.  - Cardiac MRI (6/21): Moderate LV dilation, EF 27%; moderately decreased RV function with EF 31%, no LGE, probably moderate MR.  - St Jude CRT-P implanted 7/21.  - Echo (9/21): EF 40%, diffuse hypokinesis, mildly decreased RV systolic function, mild-moderate MR, normal IVC.  - Echo (3/23): EF 40-45%, mild LVH, mildly decreased RV systolic function, moderate MR. - Echo (8/24): EF 45%, normal RV, mild MR 7. GERD 8. Hyperlipidemia 9. Hypothyroidism 10. Mitral regurgitation: Suspected to be functional.  Echo in 9/21 with improved MR,  appears mild-moderate.  11. Fe deficiency anemia 12. Fatty liver 13. CKD stage 3 14. Gastric ulcer  Social History   Socioeconomic History   Marital status: Widowed    Spouse name: Not on file   Number of children: 2   Years of education: Not on file   Highest education level: Not on file  Occupational History   Occupation: retired  Tobacco Use   Smoking status: Former   Smokeless  tobacco: Never  Advertising account planner   Vaping status: Never Used  Substance and Sexual Activity   Alcohol use: No   Drug use: No   Sexual activity: Not on file  Other Topics Concern   Not on file  Social History Narrative   Not on file   Social Drivers of Health   Financial Resource Strain: Not on file  Food Insecurity: Not on file  Transportation Needs: Not on file  Physical Activity: Not on file  Stress: Not on file  Social Connections: Not on file  Intimate Partner Violence: Not on file   Family History  Problem Relation Age of Onset   Diabetes Mother    Heart disease Mother    Hypertension Mother    Heart attack Mother    Emphysema Father    Hypertension Father    Heart attack Father    Breast cancer Sister    Diabetes Sister    Emphysema Sister    COPD Sister    Asthma Sister    Hypertension Sister    Lung cancer Brother    Diabetes Brother    Emphysema Brother    COPD Brother    Asthma Brother    Heart disease Brother    Liver disease Neg Hx    Colon cancer Neg Hx    Colon polyps Neg Hx    Esophageal cancer Neg Hx    Rectal cancer Neg Hx    Stomach cancer Neg Hx    ROS: All systems reviewed and negative except as per HPI.   Current Outpatient Medications  Medication Sig Dispense Refill   albuterol (PROVENTIL) (2.5 MG/3ML) 0.083% nebulizer solution Take 2.5 mg by nebulization every 6 (six) hours as needed for wheezing or shortness of breath.      aspirin EC 81 MG tablet Take 1 tablet (81 mg total) by mouth daily. 30 tablet 11   b complex vitamins tablet Take 1 tablet by mouth daily after lunch.     cetirizine (ZYRTEC) 10 MG tablet Take 10 mg by mouth in the morning.     Cholecalciferol (VITAMIN D-3 PO) Take 1 tablet by mouth daily after lunch.     cyclobenzaprine (FLEXERIL) 10 MG tablet Take 10 mg by mouth at bedtime as needed for muscle spasms.      famotidine (PEPCID) 40 MG tablet Take 40 mg by mouth daily as needed for heartburn.     fluticasone (FLONASE) 50  MCG/ACT nasal spray Place 2 sprays into both nostrils as needed for allergies or rhinitis.     furosemide (LASIX) 40 MG tablet Take 2 tablets (80 mg total) by mouth 2 (two) times daily. 180 tablet 3   JARDIANCE 10 MG TABS tablet TAKE 1 TABLET DAILY BEFORE BREAKFAST 90 tablet 3   levalbuterol (XOPENEX HFA) 45 MCG/ACT inhaler Inhale 2 puffs into the lungs every 4 (four) hours as needed for wheezing.     meclizine (ANTIVERT) 25 MG tablet Take 25 mg by mouth every 8 (eight) hours as needed for dizziness or nausea.  metFORMIN (GLUCOPHAGE) 500 MG tablet Take 250 mg by mouth 2 (two) times daily.     metoprolol succinate (TOPROL-XL) 100 MG 24 hr tablet TAKE 1 TABLET TWICE A DAY (NOTE CHANGE IN DOSAGE) 60 tablet 3   Misc Natural Products (FOCUSED MIND PO) Take 1 tablet by mouth daily.     montelukast (SINGULAIR) 10 MG tablet Take 10 mg by mouth at bedtime.      nitroGLYCERIN (NITROSTAT) 0.4 MG SL tablet Place 1 tablet (0.4 mg total) under the tongue every 5 (five) minutes as needed for chest pain. 30 tablet 2   Omega-3 Fatty Acids (FISH OIL) 1200 MG CAPS Take 1,200 mg by mouth in the morning and at bedtime.     omeprazole (PRILOSEC OTC) 20 MG tablet Take 1 tablet (20 mg total) by mouth daily. 28 tablet 0   Polyethyl Glycol-Propyl Glycol (SYSTANE OP) Place 1 drop into both eyes 4 (four) times daily.     polyethylene glycol (MIRALAX / GLYCOLAX) packet Take 17 g by mouth in the morning.     rosuvastatin (CRESTOR) 20 MG tablet Take 20 mg by mouth daily.     TIROSINT 75 MCG CAPS Take 75 mcg by mouth daily before breakfast.     triamcinolone cream (KENALOG) 0.1 % Apply 1 Application topically daily as needed (skin irritation/eczema).     vitamin E 400 UNIT capsule Take 400 Units by mouth daily after lunch.     Wheat Dextrin (BENEFIBER) POWD Take 1 Scoop by mouth daily as needed for constipation (constipation/regularity).     spironolactone (ALDACTONE) 25 MG tablet Take 2 tablets (50 mg total) by mouth at  bedtime. 90 tablet 3   No current facility-administered medications for this encounter.   Wt Readings from Last 3 Encounters:  11/14/23 70.6 kg (155 lb 9.6 oz)  11/11/23 71.7 kg (158 lb 1.6 oz)  07/14/23 68.7 kg (151 lb 6.4 oz)   BP (!) 160/80   Pulse 76   Wt 70.6 kg (155 lb 9.6 oz)   SpO2 98%   BMI 29.40 kg/m   PHYSICAL EXAM: General: NAD Neck: No JVD, no thyromegaly or thyroid nodule.  Lungs: Clear to auscultation bilaterally with normal respiratory effort. CV: Nondisplaced PMI.  Heart regular S1/S2, no S3/S4, no murmur.  No peripheral edema.  No carotid bruit.  Normal pedal pulses.  Abdomen: Soft, nontender, no hepatosplenomegaly, no distention.  Skin: Intact without lesions or rashes.  Neurologic: Alert and oriented x 3.  Psych: Normal affect. Extremities: No clubbing or cyanosis.  HEENT: Normal.    Assessment/Plan: 1. Chronic systolic CHF: Patient has a nonischemic cardiomyopathy of uncertain etiology.  She has significant mitral regurgitation.  This is functional, and I do not think that it explains her cardiomyopathy (though mitral regurgitation likely worsens symptoms).  She has a LBBB that is new since the prior ECG in 2019, cannot rule out LBBB cardiomyopathy.  Prior myocarditis is also a consideration.  TEE in 4/21 showed EF 30-35% with prominent septal-lateral dyssynchrony.  Cardiac MRI in 6/21 showed moderate LV dilation, EF 27%, moderately decreased RV function with EF 31%, no LGE, probably moderate MR.  St Jude CRT-P device implanted in 7/21.  Echo in 9/21 with EF up to 40%, improved MR (only mild-moderate). Echo 3/23 showed EF 40-45%, mild LVH, mildly decreased RV systolic function, moderate MR. Echo 8/24 EF 45%, mild MR, normal RV.  NYHA class II symptoms, she is not volume overloaded by exam or Corvue.  - Continue Lasix, 80  mg bid.  - With elevated BP, increase spironolactone to 50 mg daily and check BMET/BNP today and in 10 days.  - Unable to take Entresto or  valsartan due to angioedema with both. Would not therefore try ACEI.  - Continue Toprol XL 100 mg bid.    - Continue Jardiance 10 mg daily.   2. CAD: Nonobstructive on 2020 cath.  Denies CP  - Continue ASA 81  - On Crestor 20 daily (holding for now while on diflucan, will resume after completion of thearpy) 3. Mitral regurgitation: She has functional mitral regurgitation, moderate-severe/3+ at least on TEE in 4/21.  However, after CRT, mitral regurgitation was mild-moderate on 9/21 echo, moderate on 3/23 echo and only mild on echo 8/24.   4. Fatty liver: On Crestor.  - Check lipids today.  5. CKD: Stage 3.  - Check BMET today  - Avoid NSAIDs.   6. Fe deficiency anemia: Planned for colonoscopy.   Followup in 4 months with APP.   Marca Ancona,  11/14/2023

## 2023-11-14 NOTE — Telephone Encounter (Signed)
Contacted pt to schedule an appt. Unable to reach via phone, voicemail was left.   Per 11/11/23 LOS:  Follow-Up Information  Follow-up disposition: Return in about 17 weeks (around 03/09/2024).  Check out comments: Arrange for iv iron (possibly) Labs at next visit

## 2023-11-17 ENCOUNTER — Ambulatory Visit: Payer: Medicare Other

## 2023-11-18 ENCOUNTER — Inpatient Hospital Stay: Payer: Medicare Other

## 2023-11-18 VITALS — BP 166/70 | HR 69 | Temp 98.2°F | Resp 18 | Ht 61.0 in | Wt 158.8 lb

## 2023-11-18 DIAGNOSIS — D509 Iron deficiency anemia, unspecified: Secondary | ICD-10-CM | POA: Diagnosis not present

## 2023-11-18 MED ORDER — FERUMOXYTOL INJECTION 510 MG/17 ML
510.0000 mg | Freq: Once | INTRAVENOUS | Status: AC
  Administered 2023-11-18: 510 mg via INTRAVENOUS
  Filled 2023-11-18: qty 510

## 2023-11-18 MED ORDER — SODIUM CHLORIDE 0.9 % IV SOLN: INTRAVENOUS | Status: DC

## 2023-11-18 NOTE — Patient Instructions (Signed)

## 2023-11-22 ENCOUNTER — Ambulatory Visit: Payer: Medicare Other

## 2023-11-23 ENCOUNTER — Inpatient Hospital Stay: Payer: Medicare Other

## 2023-11-23 VITALS — BP 134/57 | HR 70 | Temp 98.2°F | Resp 18

## 2023-11-23 DIAGNOSIS — D509 Iron deficiency anemia, unspecified: Secondary | ICD-10-CM | POA: Diagnosis not present

## 2023-11-23 MED ORDER — SODIUM CHLORIDE 0.9 % IV SOLN
510.0000 mg | Freq: Once | INTRAVENOUS | Status: AC
  Administered 2023-11-23: 510 mg via INTRAVENOUS
  Filled 2023-11-23: qty 510

## 2023-11-23 MED ORDER — SODIUM CHLORIDE 0.9 % IV SOLN: INTRAVENOUS | Status: DC

## 2023-11-23 NOTE — Patient Instructions (Signed)

## 2023-11-24 ENCOUNTER — Telehealth: Payer: Self-pay | Admitting: Emergency Medicine

## 2023-11-24 DIAGNOSIS — I5022 Chronic systolic (congestive) heart failure: Secondary | ICD-10-CM

## 2023-11-24 NOTE — Telephone Encounter (Signed)
Received orders from Dr. Shirlee Latch for labs. Orders placed.

## 2023-11-25 DIAGNOSIS — I5022 Chronic systolic (congestive) heart failure: Secondary | ICD-10-CM | POA: Diagnosis not present

## 2023-11-26 LAB — BASIC METABOLIC PANEL
BUN/Creatinine Ratio: 16 (ref 12–28)
BUN: 21 mg/dL (ref 8–27)
CO2: 24 mmol/L (ref 20–29)
Calcium: 10 mg/dL (ref 8.7–10.3)
Chloride: 97 mmol/L (ref 96–106)
Creatinine, Ser: 1.34 mg/dL — ABNORMAL HIGH (ref 0.57–1.00)
Glucose: 146 mg/dL — ABNORMAL HIGH (ref 70–99)
Potassium: 4.6 mmol/L (ref 3.5–5.2)
Sodium: 139 mmol/L (ref 134–144)
eGFR: 39 mL/min/{1.73_m2} — ABNORMAL LOW (ref >59)

## 2023-11-26 LAB — PRO B NATRIURETIC PEPTIDE: NT-Pro BNP: 889 pg/mL — ABNORMAL HIGH (ref 0–738)

## 2023-11-26 LAB — LIPID PANEL
Chol/HDL Ratio: 3.2 {ratio} (ref 0.0–4.4)
Cholesterol, Total: 138 mg/dL (ref 100–199)
HDL: 43 mg/dL (ref >39)
LDL Chol Calc (NIH): 66 mg/dL (ref 0–99)
Triglycerides: 172 mg/dL — ABNORMAL HIGH (ref 0–149)
VLDL Cholesterol Cal: 29 mg/dL (ref 5–40)

## 2023-11-30 DIAGNOSIS — R9431 Abnormal electrocardiogram [ECG] [EKG]: Secondary | ICD-10-CM | POA: Diagnosis not present

## 2023-11-30 DIAGNOSIS — I249 Acute ischemic heart disease, unspecified: Secondary | ICD-10-CM | POA: Diagnosis not present

## 2023-12-01 DIAGNOSIS — M1990 Unspecified osteoarthritis, unspecified site: Secondary | ICD-10-CM | POA: Diagnosis not present

## 2023-12-01 DIAGNOSIS — Z888 Allergy status to other drugs, medicaments and biological substances status: Secondary | ICD-10-CM | POA: Diagnosis not present

## 2023-12-01 DIAGNOSIS — J449 Chronic obstructive pulmonary disease, unspecified: Secondary | ICD-10-CM | POA: Diagnosis not present

## 2023-12-01 DIAGNOSIS — Z7951 Long term (current) use of inhaled steroids: Secondary | ICD-10-CM | POA: Diagnosis not present

## 2023-12-01 DIAGNOSIS — E119 Type 2 diabetes mellitus without complications: Secondary | ICD-10-CM | POA: Diagnosis not present

## 2023-12-01 DIAGNOSIS — I509 Heart failure, unspecified: Secondary | ICD-10-CM | POA: Diagnosis not present

## 2023-12-01 DIAGNOSIS — Z7982 Long term (current) use of aspirin: Secondary | ICD-10-CM | POA: Diagnosis not present

## 2023-12-01 DIAGNOSIS — R079 Chest pain, unspecified: Secondary | ICD-10-CM | POA: Diagnosis not present

## 2023-12-01 DIAGNOSIS — I2510 Atherosclerotic heart disease of native coronary artery without angina pectoris: Secondary | ICD-10-CM | POA: Diagnosis not present

## 2023-12-01 DIAGNOSIS — Z950 Presence of cardiac pacemaker: Secondary | ICD-10-CM | POA: Diagnosis not present

## 2023-12-01 DIAGNOSIS — I110 Hypertensive heart disease with heart failure: Secondary | ICD-10-CM | POA: Diagnosis not present

## 2023-12-01 DIAGNOSIS — E039 Hypothyroidism, unspecified: Secondary | ICD-10-CM | POA: Diagnosis not present

## 2023-12-01 DIAGNOSIS — I249 Acute ischemic heart disease, unspecified: Secondary | ICD-10-CM | POA: Diagnosis not present

## 2023-12-01 DIAGNOSIS — Z87440 Personal history of urinary (tract) infections: Secondary | ICD-10-CM | POA: Diagnosis not present

## 2023-12-01 DIAGNOSIS — R9431 Abnormal electrocardiogram [ECG] [EKG]: Secondary | ICD-10-CM | POA: Diagnosis not present

## 2023-12-01 DIAGNOSIS — Z7984 Long term (current) use of oral hypoglycemic drugs: Secondary | ICD-10-CM | POA: Diagnosis not present

## 2023-12-01 DIAGNOSIS — E7800 Pure hypercholesterolemia, unspecified: Secondary | ICD-10-CM | POA: Diagnosis not present

## 2023-12-01 DIAGNOSIS — Z882 Allergy status to sulfonamides status: Secondary | ICD-10-CM | POA: Diagnosis not present

## 2023-12-01 DIAGNOSIS — Z881 Allergy status to other antibiotic agents status: Secondary | ICD-10-CM | POA: Diagnosis not present

## 2023-12-01 DIAGNOSIS — Z79899 Other long term (current) drug therapy: Secondary | ICD-10-CM | POA: Diagnosis not present

## 2023-12-01 DIAGNOSIS — K219 Gastro-esophageal reflux disease without esophagitis: Secondary | ICD-10-CM | POA: Diagnosis not present

## 2023-12-01 DIAGNOSIS — Z885 Allergy status to narcotic agent status: Secondary | ICD-10-CM | POA: Diagnosis not present

## 2023-12-01 DIAGNOSIS — D509 Iron deficiency anemia, unspecified: Secondary | ICD-10-CM | POA: Diagnosis not present

## 2023-12-02 DIAGNOSIS — R079 Chest pain, unspecified: Secondary | ICD-10-CM | POA: Diagnosis not present

## 2023-12-05 ENCOUNTER — Ambulatory Visit: Payer: Medicare Other | Attending: Internal Medicine

## 2023-12-05 DIAGNOSIS — Z950 Presence of cardiac pacemaker: Secondary | ICD-10-CM | POA: Diagnosis not present

## 2023-12-05 DIAGNOSIS — I5022 Chronic systolic (congestive) heart failure: Secondary | ICD-10-CM

## 2023-12-07 NOTE — Progress Notes (Signed)
EPIC Encounter for ICM Monitoring  Patient Name: Vanessa Johnson is a 88 y.o. female Date: 12/07/2023 Primary Care Physican: Lucianne Lei, MD Primary Cardiologist: Revankar/McLean Electrophysiologist: Rae Roam Pacing: >99%          06/07/2023 Weight: 153.7 lbs  06/14/2023 Weight: 156 lbs 06/23/2023 Weight: 153.5 lbs   09/28/2023 Weight: 151-154 lbs  11/02/2023 Weight: 156 lbs 12/07/2023 Weight: 155 lbs                                                        Spoke with patient and heart failure questions reviewed.  Transmission results reviewed.  Pt asymptomatic for fluid accumulation.  Reports feeling well at this time and voices no complaints.  She was evaluated at the hospital last week for chest and stomach pain but everything was negative.     Diet:  No change   CorVue thoracic impedance suggesting normal fluid levels within the last month.   Prescribed:  Furosemide 40 mg take 2 tablet(s) (80 mg total) by mouth twice a day. (Increased on 8/2) Spironolactone 25 mg take 1 tablet by mouth at bedtime.   Labs: 11/25/2023 Creatinine 1.34, BUN 21, Potassium 4.6, Sodium 139, GFR 39  11/11/2023 Creatinine 1.17, BUN 26, Potassium 3.4, Sodium 140  A complete set of results can be found in Results Review.   Recommendations:  No changes and encouraged to call if experiencing any fluid symptoms.   Follow-up plan: ICM clinic phone appointment on 01/09/2024.   91 day device clinic remote transmission 01/19/2024.     EP/Cardiology Office Visits:  03/13/2024 with HF clinic.   Copy of ICM check sent to Dr. Ladona Ridgel.   3 month ICM trend: 12/05/2023.    12-14 Month ICM trend:     Karie Soda, RN 12/07/2023 4:30 PM

## 2023-12-08 ENCOUNTER — Other Ambulatory Visit (HOSPITAL_COMMUNITY): Payer: Self-pay | Admitting: Cardiology

## 2023-12-08 DIAGNOSIS — I5020 Unspecified systolic (congestive) heart failure: Secondary | ICD-10-CM | POA: Diagnosis not present

## 2023-12-08 DIAGNOSIS — R079 Chest pain, unspecified: Secondary | ICD-10-CM | POA: Diagnosis not present

## 2023-12-08 DIAGNOSIS — R7989 Other specified abnormal findings of blood chemistry: Secondary | ICD-10-CM | POA: Diagnosis not present

## 2023-12-08 DIAGNOSIS — Z79899 Other long term (current) drug therapy: Secondary | ICD-10-CM | POA: Diagnosis not present

## 2023-12-08 DIAGNOSIS — E119 Type 2 diabetes mellitus without complications: Secondary | ICD-10-CM | POA: Diagnosis not present

## 2023-12-08 DIAGNOSIS — Z09 Encounter for follow-up examination after completed treatment for conditions other than malignant neoplasm: Secondary | ICD-10-CM | POA: Diagnosis not present

## 2023-12-08 DIAGNOSIS — I2510 Atherosclerotic heart disease of native coronary artery without angina pectoris: Secondary | ICD-10-CM | POA: Diagnosis not present

## 2023-12-12 NOTE — Progress Notes (Unsigned)
Chief Complaint: Follow-up on iron deficiency anemia Primary GI Doctor: Dr. Chales Abrahams  HPI: Patient is a 88 y.o. year old female with multiple medical problems not limited to GERD , fatty liver disease, iron deficiency anemia, chronic systolic heart failure with EF of 30 to 35%, pacemaker placement , severe MR , nonobstructive CAD, CKD stage 3, DM2,and hypothyroidism.   ---12/24/2022 Patient has history of  recurrent iron deficiency anemia and evaluated in GI office by Gunnar Fusi, NP.  This has been a recurrent problem for years. Previously followed by GI Dr. Charm Barges.  Has also been followed by hematology and received iron infusions.  Recent decline in hemoglobin into the 7 range. Dark stools but otherwise no evidence for overt GI bleeding. She has recently gotten iron infusions and hemoglobin back up around 13 .  Hematology has referred her to [place] as Dr. Charm Barges has retired. Dr. Chales Abrahams reviewed her records and accepted her as a patient.  She has a history of large friable gastric polyps, probably the source of iron deficiency anemia.  It appears that her last colonoscopy was back in 2016 at which time a small adenoma was removed.     Patient also has a history of nonalcoholic fatty liver disease.  Dr. Towanda Malkin note the onset of liver disease was around 2016.  Patient during evaluation (08/09/2022) was negative for hepatitis A IgM antibody, hepatitis B E antigen, hepatitis B core antibody, hepatitis C antibody, FAC ANA, anti-smooth muscle antibody or AMA.  The patient's liver biopsy results were steatohepatitis.  Follow-up ultrasound October 2023 shows normal liver with no evidence of fatty liver or cirrhosis.  ----07/11/23 Endoscopy revealed esophageal Candida and patient was started on Diflucan.  If there is any further drop in hemoglobin, would consider gastric polypectomy- it would be a high risk polypectomy ( Maninder Deboer need EUS before) .   Interval History     Patient presents for follow-up. History of Iron  deficiency, receiving Iron infusions with Dr Melvyn Neth, last one was a few weeks ago. She had endoscopy with Dr. Chales Abrahams as outlined below. Her most recent Hgb has dropped to 11.7 and she is due for lab work in a few weeks.Patient is taking ASA 81mg  po daily. No NSAIDs.  Completed antifungal medication for esophageal candida. She does use inhaler prn, reinforced mouth care to prevent reoccurring infection.    Patient has history of GERD and patient taking Omeprazole 20 mg po daily. Patient states symptoms controlled with medication. Patient denies dysphagia. Patient denies nausea, vomiting, or weight loss. She does report she went to [facility] on 11/30/23 for chest and abdominal pain. CT Angio Chest/Abd/pelvis was negative. Cardiac workup was negative. She reports they told her it was indigestion. She thinks it Davelle Anselmi have been some of the fruit she ate the evening before.     History of non alcoholic fatty liver disease that has been worked up by Dr. Charm Barges. Her last ultrasound was in 09/2022. CT scan done at [facility] does not mention anything of liver. She recently had labwork done in January with normal LFT's.    Patient reports she also has history of constipation and takes daily Miralax and Benefiber. Patient reports bowel movement every 1-2 days. Denies blood in stool. Family history includes breast CA in her sister and brother with lung CA (smoker).   Wt Readings from Last 3 Encounters:  12/13/23 156 lb 2 oz (70.8 kg)  11/18/23 158 lb 12 oz (72 kg)  11/14/23 155 lb 9.6 oz (70.6 kg)  Past Medical History:  Diagnosis Date   Allergic rhinitis    Anemia 10/2022   Arthritis    Back pain    left lower back   Benign essential HTN    Biventricular cardiac pacemaker in situ 08/05/2020   BMI 34.0-34.9,adult    Bronchial asthma    Bronchitis 11/20/2018   CAD (coronary artery disease)    Heart cath 01/2017 [facility]   Cardiology follow-up encounter 01/26/2017   Overview:  Added automatically from  request for surgery [id number]  Formatting of this note might be different from the original. Added automatically from request for surgery [id number]   CHF (congestive heart failure) (HCC)    Cholelithiasis with cholecystitis without obstruction 12/04/2015   Diabetes mellitus (HCC)    Diabetes mellitus due to underlying condition with complication, without long-term current use of insulin (HCC) 01/15/2016   Formatting of this note might be different from the original. Added automatically from request for surgery [id number]   Diabetes mellitus without complication (HCC) 01/15/2016   Dysfunction of right eustachian tube 11/20/2018   Essential hypertension 05/17/2017   Gastritis    Dr. Rayfield Citizen- EGD 2018   GERD (gastroesophageal reflux disease)    Hyperlipidemia    Hypothyroid    Iron deficiency anemia 09/02/2020   Ischiogluteal bursitis of left side    Left bundle branch block 04/02/2020   Mixed dyslipidemia 05/17/2017   Nonischemic cardiomyopathy (HCC) 04/02/2020   Obstructive sleep apnea of adult 12/04/2015   Pre-ulcerative corn or callous    Sleep apnea    On CPAP   Tinea pedis of right foot 01/15/2016   Unsteadiness 08/16/2018   Last Assessment & Plan:  Emergency department follow-up for evaluation of vertigo. Acute onset of vertigo 08/12/2018.  CT head at that time was okay.  Vertiginous symptoms resolved that day.  She persists with some unsteadiness but it is generally improving.  She has chronic history of unsteady that generally is helped by meclizine.  Denies any hearing loss symptoms or ringing in the ears. EXAM sh   Vitamin D deficiency     Past Surgical History:  Procedure Laterality Date   BIOPSY  07/04/2023   Procedure: BIOPSY;  Surgeon: Lynann Bologna, MD;  Location: [facility];  Service: Gastroenterology;;   BIV PACEMAKER INSERTION CRT-P N/A 04/24/2020   Procedure: BIV PACEMAKER INSERTION CRT-P;  Surgeon: Marinus Maw, MD;  Location: [facility] INVASIVE CV LAB;  Service: Cardiovascular;   Laterality: N/A;   BREAST BIOPSY  1970, 1980, and 2000   CATARACT EXTRACTION, BILATERAL  2016   CHOLECYSTECTOMY  2017   DILATION AND CURETTAGE OF UTERUS  1996   ESOPHAGOGASTRODUODENOSCOPY (EGD) WITH PROPOFOL N/A 07/04/2023   Procedure: ESOPHAGOGASTRODUODENOSCOPY (EGD) WITH PROPOFOL;  Surgeon: Lynann Bologna, MD;  Location: [facility] ENDOSCOPY;  Service: Gastroenterology;  Laterality: N/A;  Also check CBC, CMP, PT/INR same day, prior to EGD.   HEMORRHOIDECTOMY WITH HEMORRHOID BANDING  1970   LEFT HEART CATH AND CORONARY ANGIOGRAPHY  01/2017   [facility]- No intervention   TEE WITHOUT CARDIOVERSION N/A 02/21/2020   Procedure: TRANSESOPHAGEAL ECHOCARDIOGRAM (TEE);  Surgeon: Lars Masson, MD;  Location: [facility] ENDOSCOPY;  Service: Cardiovascular;  Laterality: N/A;   TONSILLECTOMY  1965   TOTAL HIP ARTHROPLASTY Bilateral    left- 2005 and right- 2010    Current Outpatient Medications  Medication Sig Dispense Refill   albuterol (PROVENTIL) (2.5 MG/3ML) 0.083% nebulizer solution Take 2.5 mg by nebulization every 6 (six) hours as needed for wheezing or  shortness of breath.      aspirin EC 81 MG tablet Take 1 tablet (81 mg total) by mouth daily. 30 tablet 11   b complex vitamins tablet Take 1 tablet by mouth daily after lunch.     cetirizine (ZYRTEC) 10 MG tablet Take 10 mg by mouth in the morning.     Cholecalciferol (VITAMIN D-3 PO) Take 1 tablet by mouth daily after lunch.     cyclobenzaprine (FLEXERIL) 10 MG tablet Take 10 mg by mouth at bedtime as needed for muscle spasms.      famotidine (PEPCID) 40 MG tablet Take 40 mg by mouth daily as needed for heartburn.     fluticasone (FLONASE) 50 MCG/ACT nasal spray Place 2 sprays into both nostrils as needed for allergies or rhinitis.     furosemide (LASIX) 40 MG tablet TAKE 2 TABLETS TWICE A DAY (CHANGE IN DOSAGE OR PILL SIZE) 180 tablet 7   JARDIANCE 10 MG TABS tablet TAKE 1 TABLET DAILY BEFORE BREAKFAST 90 tablet 3   levalbuterol  (XOPENEX HFA) 45 MCG/ACT inhaler Inhale 2 puffs into the lungs every 4 (four) hours as needed for wheezing.     meclizine (ANTIVERT) 25 MG tablet Take 25 mg by mouth every 8 (eight) hours as needed for dizziness or nausea.      metFORMIN (GLUCOPHAGE) 500 MG tablet Take 250 mg by mouth 2 (two) times daily.     metoprolol succinate (TOPROL-XL) 100 MG 24 hr tablet TAKE 1 TABLET TWICE A DAY (NOTE CHANGE IN DOSAGE) 60 tablet 3   Misc Natural Products (FOCUSED MIND PO) Take 1 tablet by mouth daily.     montelukast (SINGULAIR) 10 MG tablet Take 10 mg by mouth at bedtime.      nitroGLYCERIN (NITROSTAT) 0.4 MG SL tablet Place 1 tablet (0.4 mg total) under the tongue every 5 (five) minutes as needed for chest pain. 30 tablet 2   Omega-3 Fatty Acids (FISH OIL) 1200 MG CAPS Take 1,200 mg by mouth in the morning and at bedtime.     omeprazole (PRILOSEC OTC) 20 MG tablet Take 1 tablet (20 mg total) by mouth daily. 28 tablet 0   Polyethyl Glycol-Propyl Glycol (SYSTANE OP) Place 1 drop into both eyes 4 (four) times daily.     polyethylene glycol (MIRALAX / GLYCOLAX) packet Take 17 g by mouth in the morning.     rosuvastatin (CRESTOR) 20 MG tablet Take 20 mg by mouth daily.     spironolactone (ALDACTONE) 25 MG tablet Take 2 tablets (50 mg total) by mouth at bedtime. 90 tablet 3   TIROSINT 75 MCG CAPS Take 75 mcg by mouth daily before breakfast.     triamcinolone cream (KENALOG) 0.1 % Apply 1 Application topically daily as needed (skin irritation/eczema).     vitamin E 400 UNIT capsule Take 400 Units by mouth daily after lunch.     Wheat Dextrin (BENEFIBER) POWD Take 1 Scoop by mouth daily as needed for constipation (constipation/regularity).     No current facility-administered medications for this visit.    Allergies as of 12/13/2023 - Review Complete 12/13/2023  Allergen Reaction Noted   Ace inhibitors Anaphylaxis 01/19/2022   Angiotensin receptor blockers Anaphylaxis 01/19/2022   Diovan [valsartan]  Swelling 04/01/2022   Entresto [sacubitril-valsartan] Anaphylaxis 01/19/2022   Tussionex pennkinetic er [hydrocod poli-chlorphe poli er] Swelling and Rash 07/03/2018   Cephalexin Swelling 05/31/2019   Codeine Other (See Comments) 12/04/2015   Sulfamethoxazole Other (See Comments) 12/04/2015   Avelox [moxifloxacin] Rash  12/04/2015   Cefuroxime axetil Rash 12/04/2015   Cephalosporins Rash 12/04/2015   Iohexol Rash 03/24/2010    Family History  Problem Relation Age of Onset   Diabetes Mother    Heart disease Mother    Hypertension Mother    Heart attack Mother    Emphysema Father    Hypertension Father    Heart attack Father    Breast cancer Sister    Diabetes Sister    Emphysema Sister    COPD Sister    Asthma Sister    Hypertension Sister    Lung cancer Brother    Diabetes Brother    Emphysema Brother    COPD Brother    Asthma Brother    Heart disease Brother    Liver disease Neg Hx    Colon cancer Neg Hx    Colon polyps Neg Hx    Esophageal cancer Neg Hx    Rectal cancer Neg Hx    Stomach cancer Neg Hx     Review of Systems:    Constitutional: No weight loss, fever, chills, weakness or fatigue HEENT: Eyes: No change in vision               Ears, Nose, Throat:  No change in hearing or congestion Skin: No rash or itching Cardiovascular: No chest pain, chest pressure or palpitations   Respiratory: No SOB or cough Gastrointestinal: See HPI and otherwise negative Genitourinary: No dysuria or change in urinary frequency Neurological: No headache, dizziness or syncope Musculoskeletal: No new muscle or joint pain Hematologic: No bleeding or bruising Psychiatric: No history of depression or anxiety    Physical Exam:  Vital signs: BP (!) 152/78   Pulse 89   Ht 5\' 1"  (1.549 m)   Wt 156 lb 2 oz (70.8 kg)   BMI 29.50 kg/m   Constitutional: Pleasant Caucasian female appears to be in NAD, Well developed, Well nourished, alert and cooperative Throat: Oral cavity and  pharynx without inflammation, swelling or lesion.  Respiratory: Respirations even and unlabored. Lungs clear to auscultation bilaterally.   No wheezes, crackles, or rhonchi.  Cardiovascular: Normal S1, S2. Regular rate and rhythm. No peripheral edema, cyanosis or pallor.  Gastrointestinal:  Soft, nondistended, nontender. No rebound or guarding. Normal bowel sounds. No appreciable masses or hepatomegaly. Rectal:  Not performed.  Msk:  Symmetrical without gross deformities. Without edema, no deformity or joint abnormality.  Neurologic:  Alert and  oriented x4;  grossly normal neurologically.  Skin:   Dry and intact without significant lesions or rashes. Psychiatric: Oriented to person, place and time. Demonstrates good judgement and reason without abnormal affect or behaviors.  RELEVANT LABS AND IMAGING: CBC    Latest Ref Rng & Units 11/11/2023    1:32 PM 07/04/2023    9:37 AM 05/27/2023    9:40 AM  CBC  WBC 4.0 - 10.5 K/uL 7.0  8.6  7.0   Hemoglobin 12.0 - 15.0 g/dL 16.1  09.6  04.5   Hematocrit 36.0 - 46.0 % 35.5  42.3  41.7   Platelets 150 - 400 K/uL 360  371  332      CMP     Latest Ref Rng & Units 11/25/2023    9:53 AM 11/11/2023    1:32 PM 08/01/2023   12:07 PM  CMP  Glucose 70 - 99 mg/dL 409  811  914   BUN 8 - 27 mg/dL 21  26  39   Creatinine 0.57 - 1.00 mg/dL 7.82  1.17  1.27   Sodium 134 - 144 mmol/L 139  140  137   Potassium 3.5 - 5.2 mmol/L 4.6  3.4  4.7   Chloride 96 - 106 mmol/L 97  99  97   CO2 20 - 29 mmol/L 24  26  24    Calcium 8.7 - 10.3 mg/dL 16.1  9.6  9.8   Total Protein 6.5 - 8.1 g/dL  7.2    Total Bilirubin 0.0 - 1.2 mg/dL  0.4    Alkaline Phos 38 - 126 U/L  109    AST 15 - 41 U/L  20    ALT 0 - 44 U/L  15       Lab Results  Component Value Date   TSH 0.235 (L) 01/20/2021    Recent ultrasound (December 2023) showed mild hepatic steatosis. No gallstones or gallbladder wall thickening. No CBD duct dilation. No focal liver lesions.  08/09/2022 per Dr.  Silvana Newness note metabolic panel shows normal liver biochemistries with SGOT 34, SGPT 22, alkaline phosphatase 98, bilirubin 0.8, albumin 4.7  Procedures April 2013 colonoscopy for evaluation of constipation -Good prep.  Exam was complete.  1 diminutive polyp was removed from the ascending colon.  Small external hemorrhoids Path-colon polyp was benign.  No adenomatous or malignant change.   July 2016 colonoscopy for iron deficiency. -Complete exam.  Bowel prep was good..  2 polyps ranging from 3 to 4 mm in size were removed.  No source of occult GI bleeding found.  Follow-up colonoscopy was not recommended. Path-tubular adenoma and hyperplastic polyp.   July 2016 EGD for iron deficiency and occult GI bleeding -Large gastric polyp, pedunculated measuring about 2 cm.  The surface was friable and there was oozing.  Multiple biopsies taken.  Antral gastritis.  Multiple antral polyps ranging 8 to 10 mm.  4 biopsies were taken in the antrum and body to evaluate for H. pylori.  Biopsies taken from the duodenum to evaluate for celiac. Path-duodenal biopsies negative.  Gastric biopsy showing chronic gastritis.  No H. pylori.  No intestinal metaplasia.  Large gastric polyp biopsy showed a hyperplastic gastric polyp with chronic inflammation and foci of acute ulceration.  No H. pylori.   April 2018 EGD -4 gastric polyps in the body of the stomach, 2 were about 1.5 cm and pedunculated.  Focal erythema with thickening of gastric folds in the antrum, biopsies taken. Path-gastric biopsies compatible with mild reactive gastropathy.  No H. pylori.  No intestinal metaplasia.  Gastric polyps were hyperplastic with ulceration and exudate.   June 2020 EGD for iron deficiency anemia. -Multiple large gastric polyps which were friable and possibly the source of chronic GI blood loss.  A 2 cm gastric polyp was removed.  A 1.5 cm polyp was removed.  A third polyp which was 1 cm was removed.  The descending duodenum was  normal.  Biopsies taken to rule out celiac disease.  Gastric biopsies taken to rule out H. Pylori Path-polyps were hyperplastic.  Duodenal biopsies were negative.  Gastric biopsy showed chronic gastritis with erosions and focal intestinal metaplasia.  No H. Pylori  September 2024 EGD with Dr. Chales Abrahams Impression:  - Esophageal plaques were found, suspicious for candidiasis. Biopsied.  - Z- line regular, 38 cm from the incisors.  - Small hiatal hernia.  - Non- bleeding healing gastric ulcer over antral polyp with no stigmata of bleeding. Biopsied.  - A few gastric polyps.  - Normal examined duodenum. Biopsied.  Path: FINAL MICROSCOPIC DIAGNOSIS:  A. SMALL BOWEL, BIOPSY:  Duodenal mucosa with normal villous architecture.  No villous atrophy or increased intraepithelial lymphocytes.   B. STOMACH ULCER, BIOPSY:  Antral mucosa with erosion, fibrosis and reactive changes.  Negative for Helicobacter pylori.  Negative for dysplasia or malignancy.   C. ESOPHAGUS, BIOPSY:  Squamous mucosa with acute inflammation and fungus consistent with  candidal esophagitis.  Negative for dysplasia or malignancy.  Assessment: Encounter Diagnoses  Name Primary?   Iron deficiency anemia due to chronic blood loss Yes   Gastroesophageal reflux disease with esophagitis without hemorrhage    Steatohepatitis, non-alcoholic    Chronic idiopathic constipation       88 year old female patient with chronic iron deficiency anemia suspected to be due to gastric ulcers. She has avoided NSAID's and receiving Iron infusions with Dr. Melvyn Neth. Discussed case with Dr. Chales Abrahams and he would like to recheck hemoglobin initially.  If significant drop, then, repeat EGD. If the ulcer is still there, then EUS/polyppectomy. For her steatohepatitis she is UTD on lab work. Will order RUQ abdominal limited for HCC screening. Normal LFTs. For the CIC we discussed continuing the Benefiber and Miralax, she can titrate as needed. Her GERD is  controlled if she takes her Prilosec po daily. She had questions today about GERD diet, will provide handout. Plan: -GERD diet, no late meals -Continue Prilosec 20mg  po daily -Continue Miralax po daily, can titrate up to twice daily if needed -Continue Benefiber once daily -Check H/H  -No NSAIDs -Abd U/S RUQ limited (HCC screening) -Request records from Dr. Charm Barges- liver biopsy and colonoscopy (per pt had one after 2016?)  Thank you for the courtesy of this consult. Please call me with any questions or concerns.   Tocarra Gassen, FNP-C [facility] 12/13/2023, 11:27 AM  Cc: Lucianne Lei, MD

## 2023-12-13 ENCOUNTER — Encounter: Payer: Self-pay | Admitting: Gastroenterology

## 2023-12-13 ENCOUNTER — Ambulatory Visit (INDEPENDENT_AMBULATORY_CARE_PROVIDER_SITE_OTHER): Payer: Medicare Other | Admitting: Gastroenterology

## 2023-12-13 ENCOUNTER — Telehealth: Payer: Self-pay

## 2023-12-13 VITALS — BP 152/78 | HR 89 | Ht 61.0 in | Wt 156.1 lb

## 2023-12-13 DIAGNOSIS — K2100 Gastro-esophageal reflux disease with esophagitis, without bleeding: Secondary | ICD-10-CM

## 2023-12-13 DIAGNOSIS — K7581 Nonalcoholic steatohepatitis (NASH): Secondary | ICD-10-CM

## 2023-12-13 DIAGNOSIS — D500 Iron deficiency anemia secondary to blood loss (chronic): Secondary | ICD-10-CM

## 2023-12-13 DIAGNOSIS — K5904 Chronic idiopathic constipation: Secondary | ICD-10-CM

## 2023-12-13 NOTE — Telephone Encounter (Signed)
Patient will get hemoglobin and hematocrit done at [facility]. Patient made aware and will go there. She was given the option if she can't go before her ultrasound date she can go on that day   Faxed successfully for lab order. [place] they will call the patient. She was second in the Q she said and they will remind her of lab work as well per [employer], broadcasting/film/video

## 2023-12-13 NOTE — Patient Instructions (Addendum)
_______________________________________________________  If your blood pressure at your visit was 140/90 or greater, please contact your primary care physician to follow up on this.  _______________________________________________________  If you are age 86 or older, your body mass index should be between 23-30. Your Body mass index is 29.5 kg/m. If this is out of the aforementioned range listed, please consider follow up with your Primary Care Provider.  If you are age 61 or younger, your body mass index should be between 19-25. Your Body mass index is 29.5 kg/m. If this is out of the aformentioned range listed, please consider follow up with your Primary Care Provider.   ________________________________________________________  The [facility] providers would like to encourage you to use [facility] to communicate with providers for non-urgent requests or questions.  Due to long hold times on the telephone, sending your provider a message by [facility] may be a faster and more efficient way to get a response.  Please allow 48 business hours for a response.  Please remember that this is for non-urgent requests.  _______________________________________________________  No NSAID's. Continue Benefiber and Miralax and omeprazole.  Requisition form has been given for you to take to [facility] to have your ultrasound done.  GERD in Adults: Diet Changes When you have gastroesophageal reflux disease (GERD), you may need to make changes to your diet. Choosing the right foods can help with your symptoms. Think about working with an expert in healthy eating called a dietitian. They can help you make healthy food choices. What are tips for following this plan? Reading food labels Look for foods that are low in saturated fat. Foods that may help with your symptoms include: Foods with less than 5% of daily value (DV) of fat. Foods with 0 grams of trans fat. Cooking [employer] in ways that don't  use a lot of fat. These ways include: Baking. Steaming. Grilling. Broiling. To add flavor, try to use herbs that are low in spice and acidity. Avoid frying your food. Meal planning  Eat small meals often rather than eating 3 large meals each day. Eat your meals slowly in a place where you feel relaxed. If told by your health care provider, avoid: Foods that cause symptoms. Keep a food diary to keep track of foods that cause symptoms. Alcohol. Drinking a lot of liquid with meals. General instructions For 2-3 hours after you eat, avoid: Bending over. Exercise. Lying down. Chew sugar-free gum after meals. What foods should I eat? Eat a healthy diet. Try to include: Foods with high amounts of fiber. These include: Fruits and vegetables. Whole grains and beans. Low-fat dairy products. Lean meats, fish, and poultry. Egg whites. Foods that cause symptoms in someone else may not cause symptoms for you. Work with your provider to find foods that are safe for you. The items listed above may not be all the foods and drinks you can have. Talk with a dietitian to learn more. The items listed above may not be a complete list of foods and beverages you can eat and drink. Contact a dietitian for more information. What foods should I avoid? Limiting some of these foods may help with your symptoms. Each person is different. Talk with a dietitian or your provider to help you find the exact foods to avoid. Some of the foods to avoid may include: Fruits Fruits with a lot of acid in them. These may include citrus fruits, such as oranges, grapefruit, pineapple, and lemons. Vegetables Deep-fried vegetables, such as [place] fries.  Vegetables, sauces, or toppings made with added fat and vegetables with acid in them. These may include tomatoes and tomato products, chili peppers, onions, garlic, and horseradish. Grains Pastries or quick breads with added fat. Meats and other proteins High-fat meats,  such as fatty beef or pork, hot dogs, ribs, ham, sausage, salami, and bacon. Fried meat or protein, such as fried fish and fried chicken. Egg yolks. Fats and oils Butter. Margarine. Shortening. Ghee. Drinks Coffee and other drinks with caffeine in them. Fizzy and sugary drinks, such as soda and energy drinks. Fruit juice made with acidic fruits, such as orange or grapefruit. Tomato juice. Sweets and desserts Chocolate and cocoa. Donuts. Seasonings and condiments Mint, such as peppermint and spearmint. Condiments, herbs, or seasonings that cause symptoms. These may include curry, hot sauce, or vinegar-based salad dressings. The items listed above may not be all the foods and drinks you should avoid. Talk with a dietitian to learn more. Questions to ask your health care provider Changes to your diet and everyday life are often the first steps taken to manage symptoms of GERD. If these changes don't help, talk with your provider about taking medicines. Where to find more information [employer] G[employer]intestinal [employer]: aboutgerd.org This information is not intended to replace advice given to you by your health care provider. Make sure you discuss any questions you have with your health care provider. Document Revised: 08/23/2023 Document Reviewed: 03/09/2023 Elsevier Patient Education  2024 [employer].

## 2023-12-19 ENCOUNTER — Telehealth: Payer: Self-pay | Admitting: Gastroenterology

## 2023-12-19 NOTE — Telephone Encounter (Signed)
 Inbound call from patient stating her stool have change to a light brown color. States she she wipes it looks yellowish. Also stated she would like to discuss scheduling endoscopy. Please advise, thank you.

## 2023-12-20 DIAGNOSIS — Z8679 Personal history of other diseases of the circulatory system: Secondary | ICD-10-CM | POA: Diagnosis not present

## 2023-12-20 DIAGNOSIS — Z6828 Body mass index (BMI) 28.0-28.9, adult: Secondary | ICD-10-CM | POA: Diagnosis not present

## 2023-12-20 DIAGNOSIS — E559 Vitamin D deficiency, unspecified: Secondary | ICD-10-CM | POA: Diagnosis not present

## 2023-12-20 DIAGNOSIS — E1140 Type 2 diabetes mellitus with diabetic neuropathy, unspecified: Secondary | ICD-10-CM | POA: Diagnosis not present

## 2023-12-20 DIAGNOSIS — I10 Essential (primary) hypertension: Secondary | ICD-10-CM | POA: Diagnosis not present

## 2023-12-20 DIAGNOSIS — R413 Other amnesia: Secondary | ICD-10-CM | POA: Diagnosis not present

## 2023-12-20 DIAGNOSIS — D509 Iron deficiency anemia, unspecified: Secondary | ICD-10-CM | POA: Diagnosis not present

## 2023-12-20 DIAGNOSIS — E039 Hypothyroidism, unspecified: Secondary | ICD-10-CM | POA: Diagnosis not present

## 2023-12-20 DIAGNOSIS — E785 Hyperlipidemia, unspecified: Secondary | ICD-10-CM | POA: Diagnosis not present

## 2023-12-20 DIAGNOSIS — K219 Gastro-esophageal reflux disease without esophagitis: Secondary | ICD-10-CM | POA: Diagnosis not present

## 2023-12-20 DIAGNOSIS — J45909 Unspecified asthma, uncomplicated: Secondary | ICD-10-CM | POA: Diagnosis not present

## 2023-12-20 NOTE — Telephone Encounter (Signed)
 Pt stated that her stools were not dark as they were and more brown now. Pt questioned setting up an EGD. 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 She has avoided NSAID's and receiving Iron infusions with Dr. Melvyn Neth. Discussed case with Dr. Chales Abrahams and he would like to recheck hemoglobin initially.  If significant drop, then, repeat EGD. If the ulcer is still there, then EUS/polyppectomy. For her steatohepatitis she is UTD on lab work. Will order RUQ abdominal limited for HCC screening. Normal LFTs. For the CIC we discussed continuing the Benefiber and Miralax, she can titrate as needed. Her GERD is controlled if she takes her Prilosec po daily. She had questions today about GERD diet, will provide handout."  Pt made aware of recommendations. Pt verbalized understanding with all questions answered.

## 2023-12-21 DIAGNOSIS — K7581 Nonalcoholic steatohepatitis (NASH): Secondary | ICD-10-CM | POA: Diagnosis not present

## 2023-12-26 ENCOUNTER — Telehealth: Payer: Self-pay

## 2023-12-26 NOTE — Telephone Encounter (Signed)
-----   Message from Margarite Gouge May sent at 12/26/2023  8:33 AM EST ----- Anderson Malta morning, For this patient she was suppose to get H/H rechecked after our visit I don't see that has been done. If she has drop in Hgb we would schedule EGD. Also let her know I got her records from Dr. Charm Barges and her last colonoscopy was 2016.   Jennette Kettle, NP

## 2023-12-26 NOTE — Telephone Encounter (Signed)
 Left message for pt to call back

## 2023-12-27 NOTE — Telephone Encounter (Signed)
 Left message for pt to call back

## 2023-12-27 NOTE — Telephone Encounter (Signed)
 Patient is returning your call stated she was out of town.

## 2023-12-27 NOTE — Telephone Encounter (Signed)
 Pt was notified of Vanessa May NP recommendations: Chart also reviewed and noted the previous documentation.  Encounter Date: 12/13/2023   Signed      Patient will get hemoglobin and hematocrit done at [facility]. Patient made aware and will go there. She was given the option if she can't go before her ultrasound date she can go on that day    Faxed successfully for lab order. [place] they will call the patient. She was second in the Q she said and they will remind her of lab work as well per Investment banker, operational signed by Lurline Hare, CMA at 12/13/2023  1:59 PM   Pt made aware. Pt verbalized understanding with all questions answered.  Pt was notified to call our office back after she has had the blood drawn so that we can request the labs from [place].

## 2023-12-29 DIAGNOSIS — K7581 Nonalcoholic steatohepatitis (NASH): Secondary | ICD-10-CM | POA: Diagnosis not present

## 2023-12-29 DIAGNOSIS — D500 Iron deficiency anemia secondary to blood loss (chronic): Secondary | ICD-10-CM | POA: Diagnosis not present

## 2023-12-29 NOTE — Telephone Encounter (Signed)
 Fax sent to [facility] & requested they send results to [facility], NP.

## 2023-12-29 NOTE — Telephone Encounter (Signed)
 Inbound call from patient wanting to inform she went to [facility] to have labs drawn today. Please advise, thank you.

## 2024-01-09 ENCOUNTER — Ambulatory Visit: Payer: Medicare Other | Attending: Internal Medicine

## 2024-01-09 DIAGNOSIS — I5022 Chronic systolic (congestive) heart failure: Secondary | ICD-10-CM

## 2024-01-09 DIAGNOSIS — Z950 Presence of cardiac pacemaker: Secondary | ICD-10-CM | POA: Diagnosis not present

## 2024-01-11 NOTE — Progress Notes (Signed)
 EPIC Encounter for ICM Monitoring  Patient Name: Vanessa Johnson is a 88 y.o. female Date: 01/11/2024 Primary Care Physican: Lucianne Lei, MD Primary Cardiologist: Revankar/McLean Electrophysiologist: Rae Roam Pacing: >99%          09/28/2023 Weight: 151-154 lbs  11/02/2023 Weight: 156 lbs 12/07/2023 Weight: 155 lbs       01/11/2024 Weight: 154.2 lbs (153-157 lbs)                                                Spoke with patient and heart failure questions reviewed.  Transmission results reviewed.  Pt asymptomatic for fluid accumulation.  Reports feeling well at this time and voices no complaints.  She asked if is safe to use Emergen-C Elderberry powder in her water.  Researched during conversation and advised the powder has 200 mg of Potassium and 75 mg of Sodium.  Advised she should limit using the powder to occasionally if needed due to potassium amount.  She said she normally only drinks it when she feel like she is getting a cold.  Encouraged her to ask at next HF clinic about how often would be safe to use the powder.     Diet:  Limits salt.   CorVue thoracic impedance suggesting normal fluid levels within the last month.   Prescribed:  Furosemide 40 mg take 2 tablet(s) (80 mg total) by mouth twice a day. (Increased on 8/2) Spironolactone 25 mg take 1 tablet by mouth at bedtime.   Labs: 11/25/2023 Creatinine 1.34, BUN 21, Potassium 4.6, Sodium 139, GFR 39  11/11/2023 Creatinine 1.17, BUN 26, Potassium 3.4, Sodium 140  A complete set of results can be found in Results Review.   Recommendations:  No changes and encouraged to call if experiencing any fluid symptoms.   Follow-up plan: ICM clinic phone appointment on 02/13/2024.   91 day device clinic remote transmission 01/23/2024.     EP/Cardiology Office Visits:  03/13/2024 with HF clinic.  Recall 04/15/2024 with Dr Ladona Ridgel.   Recall 03/26/2024 with Dr Tomie China.   Copy of ICM check sent to Dr. Ladona Ridgel.   3 month ICM trend:  01/09/2024.    12-14 Month ICM trend:     Karie Soda, RN 01/11/2024 9:15 AM

## 2024-01-13 DIAGNOSIS — J309 Allergic rhinitis, unspecified: Secondary | ICD-10-CM | POA: Diagnosis not present

## 2024-01-13 DIAGNOSIS — R070 Pain in throat: Secondary | ICD-10-CM | POA: Diagnosis not present

## 2024-01-16 DIAGNOSIS — M25551 Pain in right hip: Secondary | ICD-10-CM | POA: Diagnosis not present

## 2024-01-16 DIAGNOSIS — M545 Low back pain, unspecified: Secondary | ICD-10-CM | POA: Diagnosis not present

## 2024-01-16 DIAGNOSIS — M25552 Pain in left hip: Secondary | ICD-10-CM | POA: Diagnosis not present

## 2024-01-19 LAB — CUP PACEART REMOTE DEVICE CHECK
Battery Remaining Longevity: 54 mo
Battery Remaining Percentage: 54 %
Battery Voltage: 2.98 V
Brady Statistic AP VP Percent: 2.2 %
Brady Statistic AP VS Percent: 1 %
Brady Statistic AS VP Percent: 97 %
Brady Statistic AS VS Percent: 1 %
Brady Statistic RA Percent Paced: 1.6 %
Date Time Interrogation Session: 20250327020015
Implantable Lead Connection Status: 753985
Implantable Lead Connection Status: 753985
Implantable Lead Connection Status: 753985
Implantable Lead Implant Date: 20210701
Implantable Lead Implant Date: 20210701
Implantable Lead Implant Date: 20210701
Implantable Lead Location: 753858
Implantable Lead Location: 753859
Implantable Lead Location: 753860
Implantable Pulse Generator Implant Date: 20210701
Lead Channel Impedance Value: 490 Ohm
Lead Channel Impedance Value: 530 Ohm
Lead Channel Impedance Value: 990 Ohm
Lead Channel Pacing Threshold Amplitude: 0.75 V
Lead Channel Pacing Threshold Amplitude: 0.75 V
Lead Channel Pacing Threshold Amplitude: 0.75 V
Lead Channel Pacing Threshold Pulse Width: 0.5 ms
Lead Channel Pacing Threshold Pulse Width: 0.5 ms
Lead Channel Pacing Threshold Pulse Width: 0.5 ms
Lead Channel Sensing Intrinsic Amplitude: 12 mV
Lead Channel Sensing Intrinsic Amplitude: 3.3 mV
Lead Channel Setting Pacing Amplitude: 2 V
Lead Channel Setting Pacing Amplitude: 2 V
Lead Channel Setting Pacing Amplitude: 2 V
Lead Channel Setting Pacing Pulse Width: 0.5 ms
Lead Channel Setting Pacing Pulse Width: 0.5 ms
Lead Channel Setting Sensing Sensitivity: 2 mV
Pulse Gen Model: 3562
Pulse Gen Serial Number: 3818336

## 2024-01-20 DIAGNOSIS — R051 Acute cough: Secondary | ICD-10-CM | POA: Diagnosis not present

## 2024-01-20 DIAGNOSIS — R519 Headache, unspecified: Secondary | ICD-10-CM | POA: Diagnosis not present

## 2024-01-20 DIAGNOSIS — R0981 Nasal congestion: Secondary | ICD-10-CM | POA: Diagnosis not present

## 2024-01-23 ENCOUNTER — Ambulatory Visit (INDEPENDENT_AMBULATORY_CARE_PROVIDER_SITE_OTHER): Payer: Medicare Other

## 2024-01-23 DIAGNOSIS — I5022 Chronic systolic (congestive) heart failure: Secondary | ICD-10-CM

## 2024-01-23 DIAGNOSIS — I428 Other cardiomyopathies: Secondary | ICD-10-CM

## 2024-01-29 ENCOUNTER — Encounter: Payer: Self-pay | Admitting: Internal Medicine

## 2024-02-08 DIAGNOSIS — H35371 Puckering of macula, right eye: Secondary | ICD-10-CM | POA: Diagnosis not present

## 2024-02-13 ENCOUNTER — Ambulatory Visit: Attending: Internal Medicine

## 2024-02-13 DIAGNOSIS — I5022 Chronic systolic (congestive) heart failure: Secondary | ICD-10-CM

## 2024-02-13 DIAGNOSIS — Z950 Presence of cardiac pacemaker: Secondary | ICD-10-CM | POA: Diagnosis not present

## 2024-02-14 NOTE — Progress Notes (Signed)
 EPIC Encounter for ICM Monitoring  Patient Name: Vanessa Johnson is a 88 y.o. female Date: 02/14/2024 Primary Care Physican: Tamela Fake, MD Primary Cardiologist: Revankar/McLean Electrophysiologist: Arvid Latino Pacing: >99%          09/28/2023 Weight: 151-154 lbs  11/02/2023 Weight: 156 lbs 12/07/2023 Weight: 155 lbs       01/11/2024 Weight: 154.2 lbs (153-157 lbs)  02/14/2024 Weight: 151-154 lbs                                               Spoke with patient and heart failure questions reviewed.  Transmission results reviewed.  Pt asymptomatic for fluid accumulation.  Reports feeling well at this time and voices no complaints.   Unsure what contributed to decreased impedance.   Diet:  [employer] foods frequently.  Not strict on foods that already contain salt.    CorVue thoracic impedance suggesting normal fluid levels with the exception of possible fluid accumulation from 4/8-4/19.   Prescribed:  Furosemide  40 mg take 2 tablet(s) (80 mg total) by mouth twice a day. (Increased on 8/2) Spironolactone  25 mg take 1 tablet by mouth at bedtime.   Labs: 11/25/2023 Creatinine 1.34, BUN 21, Potassium 4.6, Sodium 139, GFR 39  11/11/2023 Creatinine 1.17, BUN 26, Potassium 3.4, Sodium 140  A complete set of results can be found in Results Review.   Recommendations:  No changes and encouraged to call if experiencing any fluid symptoms.   Follow-up plan: ICM clinic phone appointment on 03/20/2024.   91 day device clinic remote transmission 04/23/2024.     EP/Cardiology Office Visits:  03/13/2024 with HF clinic.  Recall 04/15/2024 with Dr Carolynne Citron.   Recall 03/26/2024 with Dr Lafayette Pierre.   Copy of ICM check sent to Dr. Carolynne Citron.   3 month ICM trend: 02/13/2024.    12-14 Month ICM trend:     Almyra Jain, RN 02/14/2024 4:57 PM

## 2024-02-23 DIAGNOSIS — H35341 Macular cyst, hole, or pseudohole, right eye: Secondary | ICD-10-CM | POA: Diagnosis not present

## 2024-03-07 ENCOUNTER — Telehealth: Payer: Self-pay

## 2024-03-07 ENCOUNTER — Other Ambulatory Visit (HOSPITAL_COMMUNITY): Payer: Self-pay | Admitting: Cardiology

## 2024-03-07 NOTE — Telephone Encounter (Signed)
 Pt reports feeling weak and dizzy. She has appt to see Dr Harles Lied this Friday, wonders if she needs to be sen before then. BP 144/60 pulse 84 O2 sat 99%. No edema, weight stable. She feels maybe her iron is low.

## 2024-03-08 ENCOUNTER — Other Ambulatory Visit (HOSPITAL_COMMUNITY): Payer: Self-pay | Admitting: Cardiology

## 2024-03-08 MED ORDER — METOPROLOL SUCCINATE ER 100 MG PO TB24: 100.0000 mg | ORAL_TABLET | Freq: Two times a day (BID) | ORAL | 3 refills | Status: DC

## 2024-03-08 NOTE — Progress Notes (Unsigned)
 [facility]  [address] [place],  [place]  [address] [phone]  Clinic Day:  03/09/2024  Referring physician: Tamela Fake, MD   HISTORY OF PRESENT ILLNESS:  The patient is a 88 y.o. female with a history of iron deficiency anemia.  She comes in today to reassess her iron and hemoglobin levels after receiving another course of IV iron in January 2025.  Unfortunately, the patient feels very weak and short of breath.  She denies having any overt forms of blood loss since her last visit.  Despite this, labs with multiple prior visits have shown iron deficiency anemia, which led to her also receiving courses of IV iron in January 2024, June 2024, and September/October 2024.  She did have an upper endoscopy in September 2024, which nothing ominous was seen.  PHYSICAL EXAM:  Blood pressure (!) 142/63, pulse 99, temperature 97.9 F (36.6 C), temperature source Oral, resp. rate 16, height 5\' 1"  (1.549 m), weight 150 lb (68 kg), SpO2 100%. Wt Readings from Last 3 Encounters:  03/09/24 150 lb (68 kg)  12/13/23 156 lb 2 oz (70.8 kg)  11/18/23 158 lb 12 oz (72 kg)   Body mass index is 28.34 kg/m. Performance status (ECOG): 1 - Symptomatic but completely ambulatory Physical Exam Constitutional:      Appearance: Normal appearance. She is not ill-appearing.  HENT:     Mouth/Throat:     Mouth: Mucous membranes are moist.     Pharynx: Oropharynx is clear. No oropharyngeal exudate or posterior oropharyngeal erythema.  Cardiovascular:     Rate and Rhythm: Normal rate and regular rhythm.     Heart sounds: No murmur heard.    No friction rub. No gallop.  Pulmonary:     Effort: Pulmonary effort is normal. No respiratory distress.     Breath sounds: Normal breath sounds. No wheezing, rhonchi or rales.  Abdominal:     General: Bowel sounds are normal. There is no distension.     Palpations: Abdomen is soft. There is no mass.     Tenderness: There is no  abdominal tenderness.  Musculoskeletal:        General: No swelling.     Right lower leg: No edema.     Left lower leg: No edema.  Lymphadenopathy:     Cervical: No cervical adenopathy.     Upper Body:     Right upper body: No supraclavicular or axillary adenopathy.     Left upper body: No supraclavicular or axillary adenopathy.     Lower Body: No right inguinal adenopathy. No left inguinal adenopathy.  Skin:    General: Skin is warm.     Coloration: Skin is not jaundiced.     Findings: No lesion or rash.  Neurological:     General: No focal deficit present.     Mental Status: She is alert and oriented to person, place, and time. Mental status is at baseline.  Psychiatric:        Mood and Affect: Mood normal.        Behavior: Behavior normal.        Thought Content: Thought content normal.   LABS:      Latest Ref Rng & Units 03/09/2024    1:59 PM 11/11/2023    1:32 PM 07/04/2023    9:37 AM  CBC  WBC 4.0 - 10.5 K/uL 9.9  7.0  8.6   Hemoglobin 12.0 - 15.0 g/dL 9.5  86.5  13.6  Hematocrit 36.0 - 46.0 % 29.7  35.5  42.3   Platelets 150 - 400 K/uL 564  360  371       Latest Ref Rng & Units 11/25/2023    9:53 AM 11/11/2023    1:32 PM 08/01/2023   12:07 PM  CMP  Glucose 70 - 99 mg/dL 010  272  536   BUN 8 - 27 mg/dL 21  26  39   Creatinine 0.57 - 1.00 mg/dL 6.44  0.34  7.42   Sodium 134 - 144 mmol/L 139  140  137   Potassium 3.5 - 5.2 mmol/L 4.6  3.4  4.7   Chloride 96 - 106 mmol/L 97  99  97   CO2 20 - 29 mmol/L 24  26  24    Calcium  8.7 - 10.3 mg/dL 59.5  9.6  9.8   Total Protein 6.5 - 8.1 g/dL  7.2    Total Bilirubin 0.0 - 1.2 mg/dL  0.4    Alkaline Phos 38 - 126 U/L  109    AST 15 - 41 U/L  20    ALT 0 - 44 U/L  15      Latest Reference Range & Units 03/09/24 13:59  Iron 28 - 170 ug/dL 24 (L)  UIBC ug/dL 638  TIBC 756 - 433 ug/dL 295 (H)  Saturation Ratios 10.4 - 31.8 % 4 (L)  Ferritin 11 - 307 ng/mL 5 (L)  (L): Data is abnormally low (H): Data is abnormally  high  ASSESSMENT & PLAN:  Assessment/Plan:  A 88 y.o. female whose labs are once again consistent with recurrent iron deficiency anemia.  I will once again arrange for her to receive IV iron early next week to rapidly replenish her iron stores and improve her hemoglobin.  Although she did undergo an upper endoscopy in September 2024, I do believe she needs a repeat workup with GI to have the rest of her GI tract evaluated.  Ultimately, this patient may be best served with undergoing a capsule endoscopy.  Nevertheless, we will leave that decision up to her gastroenterologist.  Otherwise, I will see her back in 2 months to reassess her labs to see how well she responded to her upcoming IV iron.  The patient understands all the plans discussed today and is in agreement with them.    Elene Downum Felicia Horde, MD

## 2024-03-08 NOTE — Progress Notes (Signed)
 Remote pacemaker transmission.

## 2024-03-09 ENCOUNTER — Inpatient Hospital Stay: Payer: Medicare Other

## 2024-03-09 ENCOUNTER — Other Ambulatory Visit: Payer: Self-pay | Admitting: Oncology

## 2024-03-09 ENCOUNTER — Inpatient Hospital Stay: Payer: Medicare Other | Attending: Oncology | Admitting: Oncology

## 2024-03-09 VITALS — BP 142/63 | HR 99 | Temp 97.9°F | Resp 16 | Ht 61.0 in | Wt 150.0 lb

## 2024-03-09 DIAGNOSIS — D509 Iron deficiency anemia, unspecified: Secondary | ICD-10-CM

## 2024-03-09 LAB — CBC WITH DIFFERENTIAL (CANCER CENTER ONLY)
Abs Immature Granulocytes: 0.05 10*3/uL (ref 0.00–0.07)
Basophils Absolute: 0.1 10*3/uL (ref 0.0–0.1)
Basophils Relative: 1 %
Eosinophils Absolute: 0.1 10*3/uL (ref 0.0–0.5)
Eosinophils Relative: 1 %
HCT: 29.7 % — ABNORMAL LOW (ref 36.0–46.0)
Hemoglobin: 9.5 g/dL — ABNORMAL LOW (ref 12.0–15.0)
Immature Granulocytes: 1 %
Lymphocytes Relative: 22 %
Lymphs Abs: 2.2 10*3/uL (ref 0.7–4.0)
MCH: 25.2 pg — ABNORMAL LOW (ref 26.0–34.0)
MCHC: 32 g/dL (ref 30.0–36.0)
MCV: 78.8 fL — ABNORMAL LOW (ref 80.0–100.0)
Monocytes Absolute: 1 10*3/uL (ref 0.1–1.0)
Monocytes Relative: 10 %
Neutro Abs: 6.5 10*3/uL (ref 1.7–7.7)
Neutrophils Relative %: 65 %
Platelet Count: 564 10*3/uL — ABNORMAL HIGH (ref 150–400)
RBC: 3.77 MIL/uL — ABNORMAL LOW (ref 3.87–5.11)
RDW: 15.8 % — ABNORMAL HIGH (ref 11.5–15.5)
WBC Count: 9.9 10*3/uL (ref 4.0–10.5)
nRBC: 0.2 % (ref 0.0–0.2)

## 2024-03-09 LAB — IRON AND TIBC
Iron: 24 ug/dL — ABNORMAL LOW (ref 28–170)
Saturation Ratios: 4 % — ABNORMAL LOW (ref 10.4–31.8)
TIBC: 629 ug/dL — ABNORMAL HIGH (ref 250–450)
UIBC: 605 ug/dL

## 2024-03-09 LAB — FERRITIN: Ferritin: 5 ng/mL — ABNORMAL LOW (ref 11–307)

## 2024-03-13 ENCOUNTER — Inpatient Hospital Stay

## 2024-03-13 ENCOUNTER — Encounter (HOSPITAL_COMMUNITY): Payer: Medicare Other

## 2024-03-13 VITALS — BP 136/74 | HR 77 | Temp 97.5°F | Resp 18

## 2024-03-13 DIAGNOSIS — D509 Iron deficiency anemia, unspecified: Secondary | ICD-10-CM

## 2024-03-13 MED ORDER — SODIUM CHLORIDE 0.9 % IV SOLN: INTRAVENOUS | Status: DC

## 2024-03-13 MED ORDER — FERRIC DERISOMALTOSE(ONE DOSE) 1000 MG/10ML IV SOLN
1000.0000 mg | Freq: Once | INTRAVENOUS | Status: AC
  Administered 2024-03-13: 1000 mg via INTRAVENOUS
  Filled 2024-03-13: qty 10

## 2024-03-14 NOTE — Progress Notes (Signed)
 1500 THE PATIENT CALLED CONCERNED THAT THE MONOFERRIC CAUSED HER TO HAVE VISUAL PROBLEMS, DIZZINESS AND MID BACK PAIN. I INFORMED MELISSA PARSONS, NP. MELISSA STATED THAT THE PATIENT HAS A LOT OF ISSUES WITH HER HEART FOR HER TO CALL HER HEART DOCTOR.  THE MONOFERRIC USUALLY DOES NOT CAUSE THESE SIDE EFFECTS. SUSAN PHY, PHARMD IN PHARMACY AGREED WITH THE ABOVE. I CALLED THE PATIENT AND INFORMED HER TO CALL OR GO TO SEE HER HEART DOCTOR IMMEDIATELY. SHE HAS SEVERAL ISSUES WITH CHF AND BLOOD PRESSURE PROBLEMS. SHE STATED THAT SHE WOULD CONTACT THEM. I ALSO INFORMED HER IF IT GOT WORSE BEFORE SHE GOT AN APPOINTMENT TO GO TO THE EMERGENCY ROOM IMMEDIATELY. SHE VOICED THAT SHE WOULD GO IF IT GOT WORSE.

## 2024-03-20 ENCOUNTER — Ambulatory Visit: Attending: Internal Medicine

## 2024-03-20 DIAGNOSIS — E663 Overweight: Secondary | ICD-10-CM | POA: Diagnosis not present

## 2024-03-20 DIAGNOSIS — R0609 Other forms of dyspnea: Secondary | ICD-10-CM | POA: Diagnosis not present

## 2024-03-20 DIAGNOSIS — R42 Dizziness and giddiness: Secondary | ICD-10-CM | POA: Diagnosis not present

## 2024-03-20 DIAGNOSIS — I5022 Chronic systolic (congestive) heart failure: Secondary | ICD-10-CM | POA: Diagnosis not present

## 2024-03-20 DIAGNOSIS — Z862 Personal history of diseases of the blood and blood-forming organs and certain disorders involving the immune mechanism: Secondary | ICD-10-CM | POA: Diagnosis not present

## 2024-03-20 DIAGNOSIS — Z760 Encounter for issue of repeat prescription: Secondary | ICD-10-CM | POA: Diagnosis not present

## 2024-03-20 DIAGNOSIS — Z6827 Body mass index (BMI) 27.0-27.9, adult: Secondary | ICD-10-CM | POA: Diagnosis not present

## 2024-03-20 DIAGNOSIS — R531 Weakness: Secondary | ICD-10-CM | POA: Diagnosis not present

## 2024-03-20 DIAGNOSIS — Z950 Presence of cardiac pacemaker: Secondary | ICD-10-CM

## 2024-03-21 DIAGNOSIS — R0609 Other forms of dyspnea: Secondary | ICD-10-CM | POA: Diagnosis not present

## 2024-03-22 NOTE — Progress Notes (Unsigned)
 EPIC Encounter for ICM Monitoring  Patient Name: Vanessa Johnson is a 88 y.o. female Date: 03/22/2024 Primary Care Physican: Tamela Fake, MD Primary Cardiologist: Revankar/McLean Electrophysiologist: Arvid Latino Pacing: >99%          09/28/2023 Weight: 151-154 lbs  11/02/2023 Weight: 156 lbs 12/07/2023 Weight: 155 lbs       01/11/2024 Weight: 154.2 lbs (153-157 lbs)  02/14/2024 Weight: 151-154 lbs                                               Spoke with patient and heart failure questions reviewed.  Transmission results reviewed.  Pt asymptomatic for fluid accumulation.  She has been feeling dizzy for past 3 weeks and followed up with PA but has improved in the last couple of days.     Diet:  [employer] foods frequently.  Not strict on foods that already contain salt.    CorVue thoracic impedance suggesting normal fluid levels with the exception of possible fluid accumulation from 5/4-5/11.   Prescribed:  Furosemide  40 mg take 2 tablet(s) (80 mg total) by mouth twice a day. (Increased on 8/2) Spironolactone  25 mg take 1 tablet by mouth at bedtime.   Labs: 11/25/2023 Creatinine 1.34, BUN 21, Potassium 4.6, Sodium 139, GFR 39  11/11/2023 Creatinine 1.17, BUN 26, Potassium 3.4, Sodium 140  A complete set of results can be found in Results Review.   Recommendations:  No changes and encouraged to call if experiencing any fluid symptoms.   Follow-up plan: ICM clinic phone appointment on 05/07/2024.   91 day device clinic remote transmission 04/23/2024.     EP/Cardiology Office Visits:  04/06/2024 with HF clinic.  Recall 04/15/2024 with Dr Carolynne Citron.   Recall 03/26/2024 with Dr Lafayette Pierre.   Copy of ICM check sent to Dr. Carolynne Citron.   3 month ICM trend: 03/20/2024.    12-14 Month ICM trend:     Almyra Jain, RN 03/22/2024 2:37 PM

## 2024-03-26 DIAGNOSIS — R911 Solitary pulmonary nodule: Secondary | ICD-10-CM | POA: Diagnosis not present

## 2024-03-26 DIAGNOSIS — J4520 Mild intermittent asthma, uncomplicated: Secondary | ICD-10-CM | POA: Diagnosis not present

## 2024-03-26 DIAGNOSIS — G4733 Obstructive sleep apnea (adult) (pediatric): Secondary | ICD-10-CM | POA: Diagnosis not present

## 2024-03-26 DIAGNOSIS — J310 Chronic rhinitis: Secondary | ICD-10-CM | POA: Diagnosis not present

## 2024-04-02 NOTE — Progress Notes (Unsigned)
 04/02/2024 Vanessa Johnson [id number] February 16, 1936  Referring provider: Tamela Fake, MD Primary GI doctor: Dr. Venice Gillis  ASSESSMENT AND PLAN:  Iron deficiency anemia History of recurrent IDA for years requiring follow-up hematology with IV infusions 06/2023 EGD with l candidiasis and healing gastric ulcer over antral polyp, EGD 2020 multiple large benign gastric polyps removed thought to be source of bleeding 2016 colonoscopy 2 small adenomatous polyp removed  GERD 07/11/2023 EGD with Dr. Venice Gillis showed candidiasis, small hiatal hernia, nonbleeding healing gastric ulcer over antral polyp with no stigmata of bleeding, few gastric polyps, normal examined duodenum pathology showed no celiac negative H. pylori negative dysplasia 2020 EGD showed multiple large gastric polyps 2 cm gastric polyp removed 1.5 cm polyp removed third polyp 1 cm was removed hyperplastic polyps On omeprazole   Constipation  Fatty liver    Latest Ref Rng & Units 11/11/2023    1:32 PM 07/04/2023    9:37 AM 04/22/2021   10:28 AM  Hepatic Function  Total Protein 6.5 - 8.1 g/dL 7.2  8.1  6.7   Albumin 3.5 - 5.0 g/dL 4.5  4.3  4.5   AST 15 - 41 U/L 20  26  18    ALT 0 - 44 U/L 15  20  18    Alk Phosphatase 38 - 126 U/L 109  68  130   Total Bilirubin 0.0 - 1.2 mg/dL 0.4  0.8  0.4   Bilirubin, Direct 0.00 - 0.40 mg/dL   8.29    Platelets 562  INR 07/04/2023 1.0  Negative hepatitis panel, ASMA, AMA Liver biopsy steatohepatitis  Chronic systolic heart failure with ejection fraction 30 to 35%  Severe MR with history of sick sinus syndrome pacemaker placement  Nonobstructive CAD CT angio chest 11/30/2023 unremarkable  CKD stage III  Type 2 diabetes  Patient Care Team: Tamela Fake, MD as PCP - General (Internal Medicine) Revankar, Micael Adas, MD as PCP - Cardiology (Cardiology) Jerlene Moody, MD (Gastroenterology) Deloria Fetch, MD as Consulting Physician (Oncology)  HISTORY OF PRESENT ILLNESS: 88 y.o. female with a  past medical history listed below presents for evaluation of anemia.   She was previously seen in the office 12/13/2023 by Edsel Grace May, NP for iron deficiency anemia, GERD constipation and steatohepatitis  *** Discussed the use of AI scribe software for clinical note transcription with the patient, who gave verbal consent to proceed.  History of Present Illness            She  reports that she has quit smoking. She has never used smokeless tobacco. She reports that she does not drink alcohol and does not use drugs.  RELEVANT GI HISTORY, IMAGING AND LABS: Results          CBC    Component Value Date/Time   WBC 9.9 03/09/2024 1359   WBC 8.6 07/04/2023 0937   RBC 3.77 (L) 03/09/2024 1359   HGB 9.5 (L) 03/09/2024 1359   HGB 15.3 01/20/2021 0000   HCT 29.7 (L) 03/09/2024 1359   HCT 44.8 01/20/2021 0000   PLT 564 (H) 03/09/2024 1359   PLT 294 01/20/2021 0000   MCV 78.8 (L) 03/09/2024 1359   MCV 89 02/26/2021 0000   MCH 25.2 (L) 03/09/2024 1359   MCHC 32.0 03/09/2024 1359   RDW 15.8 (H) 03/09/2024 1359   RDW 13.1 01/20/2021 0000   LYMPHSABS 2.2 03/09/2024 1359   LYMPHSABS 2.0 01/20/2021 0000   MONOABS 1.0 03/09/2024 1359   EOSABS 0.1 03/09/2024 1359  EOSABS 0.3 01/20/2021 0000   BASOSABS 0.1 03/09/2024 1359   BASOSABS 0.1 01/20/2021 0000   Recent Labs    04/08/23 1101 05/27/23 0940 07/04/23 0937 11/11/23 1332 03/09/24 1359  HGB 10.9* 13.1 13.6 11.7* 9.5*    CMP     Component Value Date/Time   NA 139 11/25/2023 0953   K 4.6 11/25/2023 0953   CL 97 11/25/2023 0953   CO2 24 11/25/2023 0953   GLUCOSE 146 (H) 11/25/2023 0953   GLUCOSE 119 (H) 11/11/2023 1332   BUN 21 11/25/2023 0953   CREATININE 1.34 (H) 11/25/2023 0953   CREATININE 1.17 (H) 11/11/2023 1332   CALCIUM  10.0 11/25/2023 0953   PROT 7.2 11/11/2023 1332   PROT 6.7 04/22/2021 1028   ALBUMIN 4.5 11/11/2023 1332   ALBUMIN 4.5 04/22/2021 1028   AST 20 11/11/2023 1332   ALT 15 11/11/2023 1332    ALKPHOS 109 11/11/2023 1332   BILITOT 0.4 11/11/2023 1332   GFRNONAA 46 (L) 11/11/2023 1332   GFRAA 48 (L) 09/17/2020 1057      Latest Ref Rng & Units 11/11/2023    1:32 PM 07/04/2023    9:37 AM 04/22/2021   10:28 AM  Hepatic Function  Total Protein 6.5 - 8.1 g/dL 7.2  8.1  6.7   Albumin 3.5 - 5.0 g/dL 4.5  4.3  4.5   AST 15 - 41 U/L 20  26  18    ALT 0 - 44 U/L 15  20  18    Alk Phosphatase 38 - 126 U/L 109  68  130   Total Bilirubin 0.0 - 1.2 mg/dL 0.4  0.8  0.4   Bilirubin, Direct 0.00 - 0.40 mg/dL   1.61       Current Medications:   Current Outpatient Medications (Endocrine & Metabolic):    JARDIANCE  10 MG TABS tablet, TAKE 1 TABLET DAILY BEFORE BREAKFAST   metFORMIN  (GLUCOPHAGE ) 500 MG tablet, Take 250 mg by mouth 2 (two) times daily.   TIROSINT 75 MCG CAPS, Take 75 mcg by mouth daily before breakfast.  Current Outpatient Medications (Cardiovascular):    furosemide  (LASIX ) 40 MG tablet, TAKE 2 TABLETS TWICE A DAY (CHANGE IN DOSAGE OR PILL SIZE)   metoprolol  succinate (TOPROL -XL) 100 MG 24 hr tablet, Take 1 tablet (100 mg total) by mouth in the morning and at bedtime. Take with or immediately following a meal.   nitroGLYCERIN  (NITROSTAT ) 0.4 MG SL tablet, Place 1 tablet (0.4 mg total) under the tongue every 5 (five) minutes as needed for chest pain.   rosuvastatin  (CRESTOR ) 20 MG tablet, Take 20 mg by mouth daily.   spironolactone  (ALDACTONE ) 25 MG tablet, Take 2 tablets (50 mg total) by mouth at bedtime.  Current Outpatient Medications (Respiratory):    albuterol  (PROVENTIL ) (2.5 MG/3ML) 0.083% nebulizer solution, Take 2.5 mg by nebulization every 6 (six) hours as needed for wheezing or shortness of breath.    cetirizine (ZYRTEC) 10 MG tablet, Take 10 mg by mouth in the morning.   fluticasone (FLONASE) 50 MCG/ACT nasal spray, Place 2 sprays into both nostrils as needed for allergies or rhinitis.   levalbuterol (XOPENEX HFA) 45 MCG/ACT inhaler, Inhale 2 puffs into the lungs every  4 (four) hours as needed for wheezing.   montelukast (SINGULAIR) 10 MG tablet, Take 10 mg by mouth at bedtime.   Current Outpatient Medications (Analgesics):    aspirin  EC 81 MG tablet, Take 1 tablet (81 mg total) by mouth daily.   Current Outpatient Medications (Other):  b complex vitamins tablet, Take 1 tablet by mouth daily after lunch.   Cholecalciferol (VITAMIN D-3 PO), Take 1 tablet by mouth daily after lunch.   cyclobenzaprine (FLEXERIL) 10 MG tablet, Take 10 mg by mouth at bedtime as needed for muscle spasms.    famotidine (PEPCID) 40 MG tablet, Take 40 mg by mouth daily as needed for heartburn.   meclizine (ANTIVERT) 25 MG tablet, Take 25 mg by mouth every 8 (eight) hours as needed for dizziness or nausea.    Misc Natural Products (FOCUSED MIND PO), Take 1 tablet by mouth daily.   Omega-3 Fatty Acids (FISH OIL) 1200 MG CAPS, Take 1,200 mg by mouth in the morning and at bedtime.   omeprazole  (PRILOSEC  OTC) 20 MG tablet, Take 1 tablet (20 mg total) by mouth daily.   Polyethyl Glycol-Propyl Glycol (SYSTANE OP), Place 1 drop into both eyes 4 (four) times daily.   polyethylene glycol (MIRALAX / GLYCOLAX) packet, Take 17 g by mouth in the morning.   triamcinolone  cream (KENALOG ) 0.1 %, Apply 1 Application topically daily as needed (skin irritation/eczema).   vitamin E 400 UNIT capsule, Take 400 Units by mouth daily after lunch.   Wheat Dextrin (BENEFIBER) POWD, Take 1 Scoop by mouth daily as needed for constipation (constipation/regularity).  Medical History:  Past Medical History:  Diagnosis Date   Allergic rhinitis    Anemia 10/2022   Arthritis    Back pain    left lower back   Benign essential HTN    Biventricular cardiac pacemaker in situ 08/05/2020   BMI 34.0-34.9,adult    Bronchial asthma    Bronchitis 11/20/2018   CAD (coronary artery disease)    Heart cath 01/2017 [facility]   Cardiology follow-up encounter 01/26/2017   Overview:  Added automatically from request  for surgery [id number]  Formatting of this note might be different from the original. Added automatically from request for surgery [id number]   CHF (congestive heart failure) (HCC)    Cholelithiasis with cholecystitis without obstruction 12/04/2015   Diabetes mellitus (HCC)    Diabetes mellitus due to underlying condition with complication, without long-term current use of insulin (HCC) 01/15/2016   Formatting of this note might be different from the original. Added automatically from request for surgery [id number]   Diabetes mellitus without complication (HCC) 01/15/2016   Dysfunction of right eustachian tube 11/20/2018   Essential hypertension 05/17/2017   Gastritis    Dr. Florene Huntington- EGD 2018   GERD (gastroesophageal reflux disease)    Hyperlipidemia    Hypothyroid    Iron deficiency anemia 09/02/2020   Ischiogluteal bursitis of left side    Left bundle branch block 04/02/2020   Mixed dyslipidemia 05/17/2017   Nonischemic cardiomyopathy (HCC) 04/02/2020   Obstructive sleep apnea of adult 12/04/2015   Pre-ulcerative corn or callous    Sleep apnea    On CPAP   Tinea pedis of right foot 01/15/2016   Unsteadiness 08/16/2018   Last Assessment & Plan:  Emergency department follow-up for evaluation of vertigo. Acute onset of vertigo 08/12/2018.  CT head at that time was okay.  Vertiginous symptoms resolved that day.  She persists with some unsteadiness but it is generally improving.  She has chronic history of unsteady that generally is helped by meclizine.  Denies any hearing loss symptoms or ringing in the ears. EXAM sh   Vitamin D deficiency    Allergies:  Allergies  Allergen Reactions   Ace Inhibitors Anaphylaxis   Angiotensin Receptor Blockers Anaphylaxis  Diovan  [Valsartan ] Swelling   Entresto  [Sacubitril-Valsartan ] Anaphylaxis   Tussionex Pennkinetic Er [Hydrocod Poli-Chlorphe Poli Er] Swelling and Rash    Throat swelling   Cephalexin Swelling    Throat swelling    Codeine Other (See  Comments)    Unknown    Sulfamethoxazole Other (See Comments)    Unknown   Avelox [Moxifloxacin] Rash   Cefuroxime Axetil Rash   Cephalosporins Rash   Iohexol Rash     Surgical History:  She  has a past surgical history that includes Total hip arthroplasty (Bilateral); Breast biopsy (1970, 1980, and 2000); Hemorrhoidectomy with hemorrhoid banding (1970); Cholecystectomy (2017); Cataract extraction, bilateral (2016); Tonsillectomy (1965); Dilation and curettage of uterus (1996); LEFT HEART CATH AND CORONARY ANGIOGRAPHY (01/2017); TEE without cardioversion (N/A, 02/21/2020); BIV PACEMAKER INSERTION CRT-P (N/A, 04/24/2020); Esophagogastroduodenoscopy (egd) with propofol  (N/A, 07/04/2023); and biopsy (07/04/2023). Family History:  Her family history includes Asthma in her brother and sister; Breast cancer in her sister; COPD in her brother and sister; Diabetes in her brother, mother, and sister; Emphysema in her brother, father, and sister; Heart attack in her father and mother; Heart disease in her brother and mother; Hypertension in her father, mother, and sister; Lung cancer in her brother.  REVIEW OF SYSTEMS  : All other systems reviewed and negative except where noted in the History of Present Illness.  PHYSICAL EXAM: There were no vitals taken for this visit. Physical Exam          Edmonia Gottron, PA-C 1:18 PM

## 2024-04-03 ENCOUNTER — Ambulatory Visit (INDEPENDENT_AMBULATORY_CARE_PROVIDER_SITE_OTHER): Admitting: Physician Assistant

## 2024-04-03 ENCOUNTER — Other Ambulatory Visit (INDEPENDENT_AMBULATORY_CARE_PROVIDER_SITE_OTHER)

## 2024-04-03 ENCOUNTER — Encounter: Payer: Self-pay | Admitting: Physician Assistant

## 2024-04-03 VITALS — BP 128/60 | HR 79 | Ht 61.0 in | Wt 150.0 lb

## 2024-04-03 DIAGNOSIS — K2100 Gastro-esophageal reflux disease with esophagitis, without bleeding: Secondary | ICD-10-CM

## 2024-04-03 DIAGNOSIS — I5022 Chronic systolic (congestive) heart failure: Secondary | ICD-10-CM | POA: Diagnosis not present

## 2024-04-03 DIAGNOSIS — K5909 Other constipation: Secondary | ICD-10-CM | POA: Diagnosis not present

## 2024-04-03 DIAGNOSIS — K5904 Chronic idiopathic constipation: Secondary | ICD-10-CM | POA: Diagnosis not present

## 2024-04-03 DIAGNOSIS — K7581 Nonalcoholic steatohepatitis (NASH): Secondary | ICD-10-CM

## 2024-04-03 DIAGNOSIS — I2510 Atherosclerotic heart disease of native coronary artery without angina pectoris: Secondary | ICD-10-CM

## 2024-04-03 DIAGNOSIS — D509 Iron deficiency anemia, unspecified: Secondary | ICD-10-CM | POA: Diagnosis not present

## 2024-04-03 DIAGNOSIS — I340 Nonrheumatic mitral (valve) insufficiency: Secondary | ICD-10-CM | POA: Diagnosis not present

## 2024-04-03 DIAGNOSIS — K760 Fatty (change of) liver, not elsewhere classified: Secondary | ICD-10-CM | POA: Diagnosis not present

## 2024-04-03 DIAGNOSIS — K219 Gastro-esophageal reflux disease without esophagitis: Secondary | ICD-10-CM | POA: Diagnosis not present

## 2024-04-03 DIAGNOSIS — Z950 Presence of cardiac pacemaker: Secondary | ICD-10-CM | POA: Diagnosis not present

## 2024-04-03 DIAGNOSIS — Z87891 Personal history of nicotine dependence: Secondary | ICD-10-CM

## 2024-04-03 DIAGNOSIS — R195 Other fecal abnormalities: Secondary | ICD-10-CM

## 2024-04-03 LAB — CBC WITH DIFFERENTIAL/PLATELET
Basophils Absolute: 0.1 10*3/uL (ref 0.0–0.1)
Basophils Relative: 1.2 % (ref 0.0–3.0)
Eosinophils Absolute: 0.1 10*3/uL (ref 0.0–0.7)
Eosinophils Relative: 0.9 % (ref 0.0–5.0)
HCT: 37.6 % (ref 36.0–46.0)
Hemoglobin: 12.2 g/dL (ref 12.0–15.0)
Lymphocytes Relative: 21.3 % (ref 12.0–46.0)
Lymphs Abs: 1.5 10*3/uL (ref 0.7–4.0)
MCHC: 32.5 g/dL (ref 30.0–36.0)
MCV: 86.8 fl (ref 78.0–100.0)
Monocytes Absolute: 0.7 10*3/uL (ref 0.1–1.0)
Monocytes Relative: 9.8 % (ref 3.0–12.0)
Neutro Abs: 4.8 10*3/uL (ref 1.4–7.7)
Neutrophils Relative %: 66.8 % (ref 43.0–77.0)
Platelets: 477 10*3/uL — ABNORMAL HIGH (ref 150.0–400.0)
RBC: 4.33 Mil/uL (ref 3.87–5.11)
RDW: 27.5 % — ABNORMAL HIGH (ref 11.5–15.5)
WBC: 7.2 10*3/uL (ref 4.0–10.5)

## 2024-04-03 LAB — COMPREHENSIVE METABOLIC PANEL WITH GFR
ALT: 26 U/L (ref 0–35)
AST: 26 U/L (ref 0–37)
Albumin: 4.7 g/dL (ref 3.5–5.2)
Alkaline Phosphatase: 66 U/L (ref 39–117)
BUN: 26 mg/dL — ABNORMAL HIGH (ref 6–23)
CO2: 28 meq/L (ref 19–32)
Calcium: 10.1 mg/dL (ref 8.4–10.5)
Chloride: 96 meq/L (ref 96–112)
Creatinine, Ser: 1.4 mg/dL — ABNORMAL HIGH (ref 0.40–1.20)
GFR: 34.13 mL/min — ABNORMAL LOW (ref >60.00)
Glucose, Bld: 151 mg/dL — ABNORMAL HIGH (ref 70–99)
Potassium: 3.7 meq/L (ref 3.5–5.1)
Sodium: 134 meq/L — ABNORMAL LOW (ref 135–145)
Total Bilirubin: 0.6 mg/dL (ref 0.2–1.2)
Total Protein: 7.6 g/dL (ref 6.0–8.3)

## 2024-04-03 LAB — PROTIME-INR
INR: 1 ratio (ref 0.8–1.0)
Prothrombin Time: 10.6 s (ref 9.6–13.1)

## 2024-04-03 NOTE — Patient Instructions (Signed)
 Your provider has requested that you go to the basement level for lab work before leaving today. Press "B" on the elevator. The lab is located at the first door on the left as you exit the elevator.  You have been scheduled for a CT scan of the abdomen and pelvis at [facility] in [place] ([address]), You are scheduled on 04-05-24 at 3:30pm. You should arrive at 1:15pm for registration and to drink 2 bottles of contrast. Please follow the written instructions below on the day of your exam:   1) Do not eat anything after 11:30am (4 hours prior to your test)   You may take any medications as prescribed with a small amount of water, if necessary. If you take any of the following medications: METFORMIN , GLUCOPHAGE , GLUCOVANCE, AVANDAMET, RIOMET , FORTAMET , ACTOPLUS MET, JANUMET, GLUMETZA  or METAGLIP, you MAY be asked to HOLD this medication 48 hours AFTER the exam.   The purpose of you drinking the oral contrast is to aid in the visualization of your intestinal tract. The contrast solution may cause some diarrhea. Depending on your individual set of symptoms, you may also receive an intravenous injection of x-ray contrast/dye. Plan on being at [facility] for 45 minutes or longer, depending on the type of exam you are having performed.   If you have any questions regarding your exam or if you need to reschedule, you may call [facility] at [phone] between the hours of 8:00 am and 5:00 pm, Monday-Friday.   Due to recent changes in healthcare laws, you may see the results of your imaging and laboratory studies on MyChart before your provider has had a chance to review them.  We understand that in some cases there may be results that are confusing or concerning to you. Not all laboratory results come back in the same time frame and the provider may be waiting for multiple results in order to interpret others.  Please give us  48 hours in order for your provider to thoroughly review all the results  before contacting the office for clarification of your results.    VISIT SUMMARY:  During today's visit, we discussed your ongoing issues with anemia and constipation. We reviewed your history of gastric polyps and iron deficiency anemia, and noted your recent symptoms and treatments. We also addressed your chronic constipation and the measures you are currently taking to manage it.  YOUR PLAN:  -IRON DEFICIENCY ANEMIA: Iron deficiency anemia is a condition where your body lacks enough iron to produce healthy red blood cells, leading to symptoms like weakness and shortness of breath. We will recheck your complete blood count (CBC) to monitor your hemoglobin levels. A CT scan of your abdomen and pelvis will be done without contrast due to your allergy. We will consult with Dr. Venice Gillis to determine if a capsule endoscopy or colonoscopy is needed based on the CT results and your previous colonoscopy findings. Please continue your follow-up with hematology for iron deficiency management.  -INTERNAL HEMORRHOIDS: Internal hemorrhoids are swollen veins in the lower part of your rectum that can cause bleeding. They may be contributing to your positive hemoccult test.  -CONSTIPATION: Constipation is a condition where you have infrequent or difficult bowel movements. You should continue taking MiraLAX twice daily and use Dulcolax every other day as needed. We also provided information on managing pelvic floor dysfunction and rectocele, which may be contributing to your symptoms.  INSTRUCTIONS:  Please follow up with hematology for your iron deficiency management. We will recheck  your CBC to monitor your hemoglobin levels. A CT scan of your abdomen and pelvis will be scheduled without contrast. Based on the CT results and your previous colonoscopy, we will consult with Dr. Venice Gillis to determine if a capsule endoscopy or colonoscopy is needed.  Advised to go to the ER if there is any severe weakness, severe  abdominal pain, vomit blood, dark red blood in your bowel movement, shortness of breath or chest pain.    Rectal Prolapse, Adult Rectal prolapse happens when the last part of the large intestine (rectum) drops down out of place. There are two types of rectal prolapse: Partial. This is when the inner lining of the rectum falls or sinks out of place. It may stick out of the opening of the butt (anus). Complete. This is when all of the layers of the rectum fall or sink out of place. They may stick out of the anus. At first, the rectum may fall out of place only when you poop. Over time, it may happen more often. Then, it may fall out of place when you are walking or standing. Surgery is often needed. What are the causes? Rectal prolapse may be caused by weakness in the muscles that attach the rectum to the lower abdomen. The exact cause of the muscle weakness is often not known. What increases the risk? You are more likely to have a rectal prolapse if: You have a history of long-term (chronic) constipation or diarrhea. You often strain to poop. You are a female who is 41 years of age or older. You have a disorder that affects your nervous system. This includes multiple sclerosis or a lower spinal cord injury. You have been pregnant many times. You have had surgery on your rectum or the area between your hips (pelvis). You have cystic fibrosis. What are the signs or symptoms? The main symptom of rectal prolapse is a red mass of tissue sticking out of your anus. The mass may look inflamed, have mucus on it, or bleed a little. Other symptoms may include: Discomfort in the anus and rectum. Constipation. Feeling that poop (stool) has not fully emptied from your rectum. Diarrhea. Fecal incontinence. This is when poop, mucus, or blood leaks from the anus. How is this diagnosed? Rectal prolapse may be diagnosed based on your symptoms and a physical exam. During the exam, you may be asked to squat  and strain as if you are pooping. You may also have tests. These may include: A rectal exam using a tube with a camera on the end (sigmoidoscopy or colonoscopy). Defecogram. For this procedure, dye is put into the rectum. X-ray pictures are then taken of the rectum. How is this treated? In many cases, rectal prolapse is treated with surgery. Surgery can help fix the weak muscles and connect the rectum to the lower abdomen again. Other treatments may include: Pushing the area back into the rectum (reduction). Your health care provider may do this by gently pushing it back in using a moist cloth. You may be shown how to do this at home. Taking medicines to prevent constipation and straining. These may include medicine that helps you poop (laxatives) or stool softeners. Follow these instructions at home: Managing constipation You may need to take these actions to prevent or treat constipation: Drink enough fluid to keep your pee (urine) pale yellow. Take over-the-counter or prescription medicines. Eat foods that are high in fiber, such as beans, whole grains, and fresh fruits and vegetables. Limit foods that  are high in fat and processed sugars, such as fried or sweet foods. General instructions Take over-the-counter and prescription medicines only as told by your provider. Do not strain to poop. You may have to avoid lifting. Ask your provider how much you can safely lift. Follow instructions from your provider about what to do if the prolapse happens again. You may need to lie on your side and use a moist cloth to press it back into your rectum. Keep all follow-up visits. Your provider will watch your condition. Contact a health care provider if: You have a fever. You are not able to press the prolapse back into your rectum at home. You have constipation or diarrhea. You have mild bleeding from your rectum. Get help right away if: You have very bad pain in your rectum. You bleed a lot  from your rectum. This information is not intended to replace advice given to you by your health care provider. Make sure you discuss any questions you have with your health care provider. Document Revised: 05/26/2022 Document Reviewed: 05/26/2022 Elsevier Patient Education  2024 [employer].

## 2024-04-03 NOTE — Progress Notes (Signed)
 Agree with assessment/plan.  Edman Circle, MD [facility] [phone]

## 2024-04-04 ENCOUNTER — Ambulatory Visit: Payer: Self-pay | Admitting: Physician Assistant

## 2024-04-04 NOTE — Progress Notes (Signed)
 PCP: Tamela Fake, MD  Cardiology: Nelia Balzarine, MD HF Cardiology: Dr. Mitzie Anda  88 y.o. with history of chronic systolic CHF (nonischemic cardiomyopathy), chronic LBBB, and mitral regurgitation was referred by Dr. Arlester Ladd for CHF evaluation prior to possible Mitraclip placement.   CHF was first noted in 12/20, she was admitted to the hospital in [facility] at that time.  LHC showed nonobstructive CAD.  She later followed up with Dr. Lafayette Pierre and had echo in 3/21, showing EF 35-40% with concern for severe MR.  TEE was then done in 4/21 showing EF 30-35% with septal-lateral dyssynchrony, moderate-severe functional MR, normal RV, severe TR.  She has had a LBBB on ECGs in 2021, she did not have LBBB in 2019.    Cardiac MRI in 6/21 showed moderate LV dilation, EF 27%, moderately decreased RV function with EF 31%, no LGE, probably moderate MR.   She had St Jude CRT-P device implanted in 7/21.  Echo in 9/21 showed EF up to 40% with normal RV, mild-moderate MR. Echo 3/23 showed EF 40-45%, mild LVH, mildly decreased RV systolic function, moderate MR.   Repeat echo 8/24 EF 45%, mild MR. Normal RV.   Had EGD done 9/24 for evaluation of IDA. EGD showed non bleeding, healing, gastric ulcer =>biopsied and benign, + several gastric polyps. Also found to have esophageal candidiasis and has been started on Diflucan .   Today she returns for HF follow up. Overall feeling fine. She had dizziness last month, this resolved when she stopped her gabapentin. She is SOB walking short distances on flat ground, she walks with a cane for balance. Denies palpitations, abnormal bleeding, CP,  edema, or PND/Orthopnea. Appetite ok. Weight at home 149 pounds. Taking all medications. Wears CPAP. Planning EGD next month for work up of anemia.  ReDs reading: 35%, normal  St Jude device interrogation (personally reviewed): thoracic impedence down, >99% BiV pacing.   ECG (personally reviewed): none ordered today.  Labs (1/24):  hgb 8.9, LDL 32 Labs (6/24): hgb 10.9, K 3.6, creatinine 1.12 Labs (9/24): hgb 13.6, Scr 1.32, K 3.3 Labs (1/25): K 3.4, creatinine 1.17, hgb 11.7, LDL 66  PMH: 1. LBBB: Chronic.  2. Anemia: Prior GI workup in [place] was negative.  No history of overt GI bleeding. Fe deficiency.  3. Type 2 diabetes 4. Hyperlipidemia 5. CAD: LHC (2020) with 40% ostial left main, 50% mid RCA.  6. Chronic systolic CHF: Noted since 12/20.  Nonischemic cardiomyopathy.  - TEE (4/21): EF 30-35%, septal-lateral dyssynchrony, moderate-severe MR appears functional, normal RV size and systolic function, severe biatrial enlargement, severe TR.  - Cardiac MRI (6/21): Moderate LV dilation, EF 27%; moderately decreased RV function with EF 31%, no LGE, probably moderate MR.  - St Jude CRT-P implanted 7/21.  - Echo (9/21): EF 40%, diffuse hypokinesis, mildly decreased RV systolic function, mild-moderate MR, normal IVC.  - Echo (3/23): EF 40-45%, mild LVH, mildly decreased RV systolic function, moderate MR. - Echo (8/24): EF 45%, normal RV, mild MR 7. GERD 8. Hyperlipidemia 9. Hypothyroidism 10. Mitral regurgitation: Suspected to be functional.  Echo in 9/21 with improved MR, appears mild-moderate.  11. Fe deficiency anemia 12. Fatty liver 13. CKD stage 3 14. Gastric ulcer  Social History   Socioeconomic History   Marital status: Widowed    Spouse name: Not on file   Number of children: 2   Years of education: Not on file   Highest education level: Not on file  Occupational History   Occupation: retired  Tobacco Use   Smoking status: Former   Smokeless tobacco: Never  Vaping Use   Vaping status: Never Used  Substance and Sexual Activity   Alcohol use: No   Drug use: No   Sexual activity: Not on file  Other Topics Concern   Not on file  Social History Narrative   Not on file   Social Drivers of Health   Financial Resource Strain: Not on file  Food Insecurity: Not on file  Transportation Needs:  Not on file  Physical Activity: Not on file  Stress: Not on file  Social Connections: Not on file  Intimate Partner Violence: Not on file   Family History  Problem Relation Age of Onset   Diabetes Mother    Heart disease Mother    Hypertension Mother    Heart attack Mother    Emphysema Father    Hypertension Father    Heart attack Father    Breast cancer Sister    Diabetes Sister    Emphysema Sister    COPD Sister    Asthma Sister    Hypertension Sister    Lung cancer Brother    Diabetes Brother    Emphysema Brother    COPD Brother    Asthma Brother    Heart disease Brother    Liver disease Neg Hx    Colon cancer Neg Hx    Colon polyps Neg Hx    Esophageal cancer Neg Hx    Rectal cancer Neg Hx    Stomach cancer Neg Hx    ROS: All systems reviewed and negative except as per HPI.   Current Outpatient Medications  Medication Sig Dispense Refill   albuterol  (PROVENTIL ) (2.5 MG/3ML) 0.083% nebulizer solution Take 2.5 mg by nebulization every 6 (six) hours as needed for wheezing or shortness of breath.      aspirin  EC 81 MG tablet Take 1 tablet (81 mg total) by mouth daily. 30 tablet 11   b complex vitamins tablet Take 1 tablet by mouth daily after lunch.     cetirizine (ZYRTEC) 10 MG tablet Take 10 mg by mouth in the morning.     Cholecalciferol (VITAMIN D-3 PO) Take 1 tablet by mouth daily after lunch.     cyclobenzaprine (FLEXERIL) 10 MG tablet Take 10 mg by mouth at bedtime as needed for muscle spasms.      famotidine (PEPCID) 40 MG tablet Take 40 mg by mouth daily as needed for heartburn.     fluticasone (FLONASE) 50 MCG/ACT nasal spray Place 2 sprays into both nostrils as needed for allergies or rhinitis.     furosemide  (LASIX ) 40 MG tablet TAKE 2 TABLETS TWICE A DAY (CHANGE IN DOSAGE OR PILL SIZE) 180 tablet 7   JARDIANCE  10 MG TABS tablet TAKE 1 TABLET DAILY BEFORE BREAKFAST 90 tablet 3   levalbuterol (XOPENEX HFA) 45 MCG/ACT inhaler Inhale 2 puffs into the lungs  every 4 (four) hours as needed for wheezing.     meclizine (ANTIVERT) 25 MG tablet Take 25 mg by mouth every 8 (eight) hours as needed for dizziness or nausea.      metFORMIN  (GLUCOPHAGE ) 500 MG tablet Take 250 mg by mouth 2 (two) times daily.     metoprolol  succinate (TOPROL -XL) 100 MG 24 hr tablet Take 1 tablet (100 mg total) by mouth in the morning and at bedtime. Take with or immediately following a meal. 180 tablet 3   Misc Natural Products (FOCUSED MIND PO) Take 1 tablet by mouth  daily.     montelukast (SINGULAIR) 10 MG tablet Take 10 mg by mouth at bedtime.      nitroGLYCERIN  (NITROSTAT ) 0.4 MG SL tablet Place 1 tablet (0.4 mg total) under the tongue every 5 (five) minutes as needed for chest pain. 30 tablet 2   Omega-3 Fatty Acids (FISH OIL) 1200 MG CAPS Take 1,200 mg by mouth in the morning and at bedtime.     omeprazole  (PRILOSEC  OTC) 20 MG tablet Take 1 tablet (20 mg total) by mouth daily. 28 tablet 0   Polyethyl Glycol-Propyl Glycol (SYSTANE OP) Place 1 drop into both eyes 4 (four) times daily.     polyethylene glycol (MIRALAX / GLYCOLAX) packet Take 17 g by mouth in the morning.     rosuvastatin  (CRESTOR ) 20 MG tablet Take 20 mg by mouth daily.     spironolactone  (ALDACTONE ) 25 MG tablet Take 2 tablets (50 mg total) by mouth at bedtime. 90 tablet 3   TIROSINT 75 MCG CAPS Take 75 mcg by mouth daily before breakfast.     triamcinolone  cream (KENALOG ) 0.1 % Apply 1 Application topically daily as needed (skin irritation/eczema).     vitamin E 400 UNIT capsule Take 400 Units by mouth daily after lunch.     Wheat Dextrin (BENEFIBER) POWD Take 1 Scoop by mouth daily as needed for constipation (constipation/regularity).     No current facility-administered medications for this encounter.   Wt Readings from Last 3 Encounters:  04/06/24 68.2 kg (150 lb 6.4 oz)  04/03/24 68 kg (150 lb)  03/09/24 68 kg (150 lb)   BP (!) 154/72   Pulse 81   Wt 68.2 kg (150 lb 6.4 oz)   SpO2 97%   BMI  28.42 kg/m    PHYSICAL EXAM: General:  NAD. No resp difficulty, walked into clinic with cane, elderly HEENT: Normal Neck: Supple. No JVD. Cor: Regular rate & rhythm. No rubs, gallops or murmurs. Lungs: Clear Abdomen: Soft, nontender, nondistended.  Extremities: No cyanosis, clubbing, rash, edema Neuro: Alert & oriented x 3, moves all 4 extremities w/o difficulty. Affect pleasant.  ASSESSMENT/PLAN: 1. Chronic systolic CHF: Patient has a nonischemic cardiomyopathy of uncertain etiology.  She has significant mitral regurgitation.  This is functional, and I do not think that it explains her cardiomyopathy (though mitral regurgitation likely worsens symptoms).  She has a LBBB that is new since the prior ECG in 2019, cannot rule out LBBB cardiomyopathy.  Prior myocarditis is also a consideration.  TEE in 4/21 showed EF 30-35% with prominent septal-lateral dyssynchrony.  Cardiac MRI in 6/21 showed moderate LV dilation, EF 27%, moderately decreased RV function with EF 31%, no LGE, probably moderate MR.  St Jude CRT-P device implanted in 7/21.  Echo in 9/21 with EF up to 40%, improved MR (only mild-moderate). Echo 3/23 showed EF 40-45%, mild LVH, mildly decreased RV systolic function, moderate MR. Echo 8/24 EF 45%, mild MR, normal RV.  NYHA class IIb-III symptoms, suspect anemia is confounded functional class. She is not volume overloaded by exam or Corvue. ReDs 35% - Continue Lasix  80 mg bid.  - Continue spironolactone  50 mg daily. Labs reviewed from 04/03/24 and are stable, K 3.7 and SCr 1.40 - Unable to take Entresto  or valsartan  due to angioedema with both. Would not therefore try ACEi.  - Continue Toprol  XL 100 mg bid.    - Continue Jardiance  10 mg daily.  No GU symptoms. 2. CAD: Nonobstructive on 2020 cath.  No chest pain.  - Continue  ASA 81  - Continue Crestor  20 mg daily. Good lipids 1/25 3. Mitral regurgitation: She has functional mitral regurgitation, moderate-severe/3+ at least on TEE in  4/21.  However, after CRT, mitral regurgitation was mild-moderate on 9/21 echo, moderate on 3/23 echo and only mild on echo 8/24.   4. Fatty liver: She is on rosuvastatin . - LDL 66 (1/25) 5. CKD: Stage 3. Continue Jardiance . Baseline SCr 1.4 - Avoid NSAIDs.  BMET today. 6. Fe deficiency anemia: Planning for EGD next month.  7. HTN: BP elevated in clinic but has been relatively stable on other provider appts over the last 2 months. - Check BP daily and log. Notify clinic if sBP > 140, would then start low dose amlodipine.  Follow up in 6 months with Dr. America Bake [facility], FNP-BC 04/06/2024

## 2024-04-05 ENCOUNTER — Other Ambulatory Visit: Payer: Self-pay

## 2024-04-05 ENCOUNTER — Ambulatory Visit (HOSPITAL_BASED_OUTPATIENT_CLINIC_OR_DEPARTMENT_OTHER)
Admission: RE | Admit: 2024-04-05 | Discharge: 2024-04-05 | Disposition: A | Discharge status: Home / Self Care | Source: Ambulatory Visit | Attending: Physician Assistant | Admitting: Physician Assistant

## 2024-04-05 DIAGNOSIS — R109 Unspecified abdominal pain: Secondary | ICD-10-CM

## 2024-04-05 DIAGNOSIS — D649 Anemia, unspecified: Secondary | ICD-10-CM

## 2024-04-05 DIAGNOSIS — K5900 Constipation, unspecified: Secondary | ICD-10-CM | POA: Diagnosis not present

## 2024-04-05 DIAGNOSIS — K5904 Chronic idiopathic constipation: Secondary | ICD-10-CM

## 2024-04-05 DIAGNOSIS — B3781 Candidal esophagitis: Secondary | ICD-10-CM

## 2024-04-05 DIAGNOSIS — K2100 Gastro-esophageal reflux disease with esophagitis, without bleeding: Secondary | ICD-10-CM

## 2024-04-05 DIAGNOSIS — D509 Iron deficiency anemia, unspecified: Secondary | ICD-10-CM

## 2024-04-05 DIAGNOSIS — K317 Polyp of stomach and duodenum: Secondary | ICD-10-CM

## 2024-04-05 DIAGNOSIS — I5022 Chronic systolic (congestive) heart failure: Secondary | ICD-10-CM

## 2024-04-05 DIAGNOSIS — K7581 Nonalcoholic steatohepatitis (NASH): Secondary | ICD-10-CM

## 2024-04-06 ENCOUNTER — Ambulatory Visit (HOSPITAL_COMMUNITY)
Admission: RE | Admit: 2024-04-06 | Discharge: 2024-04-06 | Disposition: A | Discharge status: Home / Self Care | Source: Ambulatory Visit | Attending: Family Medicine | Admitting: Family Medicine

## 2024-04-06 ENCOUNTER — Ambulatory Visit (HOSPITAL_COMMUNITY): Payer: Self-pay | Admitting: Family Medicine

## 2024-04-06 ENCOUNTER — Telehealth: Payer: Self-pay

## 2024-04-06 VITALS — BP 154/72 | HR 81 | Wt 150.4 lb

## 2024-04-06 DIAGNOSIS — I5022 Chronic systolic (congestive) heart failure: Secondary | ICD-10-CM | POA: Diagnosis not present

## 2024-04-06 DIAGNOSIS — Z7982 Long term (current) use of aspirin: Secondary | ICD-10-CM | POA: Diagnosis not present

## 2024-04-06 DIAGNOSIS — I10 Essential (primary) hypertension: Secondary | ICD-10-CM

## 2024-04-06 DIAGNOSIS — D509 Iron deficiency anemia, unspecified: Secondary | ICD-10-CM | POA: Insufficient documentation

## 2024-04-06 DIAGNOSIS — D508 Other iron deficiency anemias: Secondary | ICD-10-CM | POA: Diagnosis not present

## 2024-04-06 DIAGNOSIS — N183 Chronic kidney disease, stage 3 unspecified: Secondary | ICD-10-CM | POA: Insufficient documentation

## 2024-04-06 DIAGNOSIS — I340 Nonrheumatic mitral (valve) insufficiency: Secondary | ICD-10-CM | POA: Insufficient documentation

## 2024-04-06 DIAGNOSIS — Z79899 Other long term (current) drug therapy: Secondary | ICD-10-CM | POA: Diagnosis not present

## 2024-04-06 DIAGNOSIS — I130 Hypertensive heart and chronic kidney disease with heart failure and stage 1 through stage 4 chronic kidney disease, or unspecified chronic kidney disease: Secondary | ICD-10-CM | POA: Diagnosis not present

## 2024-04-06 DIAGNOSIS — I428 Other cardiomyopathies: Secondary | ICD-10-CM | POA: Diagnosis not present

## 2024-04-06 DIAGNOSIS — Z7984 Long term (current) use of oral hypoglycemic drugs: Secondary | ICD-10-CM | POA: Insufficient documentation

## 2024-04-06 DIAGNOSIS — K760 Fatty (change of) liver, not elsewhere classified: Secondary | ICD-10-CM | POA: Diagnosis not present

## 2024-04-06 DIAGNOSIS — I447 Left bundle-branch block, unspecified: Secondary | ICD-10-CM | POA: Insufficient documentation

## 2024-04-06 DIAGNOSIS — I2510 Atherosclerotic heart disease of native coronary artery without angina pectoris: Secondary | ICD-10-CM | POA: Insufficient documentation

## 2024-04-06 DIAGNOSIS — E1122 Type 2 diabetes mellitus with diabetic chronic kidney disease: Secondary | ICD-10-CM | POA: Insufficient documentation

## 2024-04-06 DIAGNOSIS — D631 Anemia in chronic kidney disease: Secondary | ICD-10-CM | POA: Insufficient documentation

## 2024-04-06 LAB — BASIC METABOLIC PANEL WITH GFR
Anion gap: 14 (ref 5–15)
BUN: 20 mg/dL (ref 8–23)
CO2: 24 mmol/L (ref 22–32)
Calcium: 9.4 mg/dL (ref 8.9–10.3)
Chloride: 98 mmol/L (ref 98–111)
Creatinine, Ser: 1.43 mg/dL — ABNORMAL HIGH (ref 0.44–1.00)
GFR, Estimated: 36 mL/min — ABNORMAL LOW (ref >60)
Glucose, Bld: 118 mg/dL — ABNORMAL HIGH (ref 70–99)
Potassium: 3.5 mmol/L (ref 3.5–5.1)
Sodium: 136 mmol/L (ref 135–145)

## 2024-04-06 MED ORDER — NA SULFATE-K SULFATE-MG SULF 17.5-3.13-1.6 GM/177ML PO SOLN: 1.0000 | ORAL | 0 refills | Status: DC

## 2024-04-06 NOTE — Telephone Encounter (Signed)
 Left message for patient to return call to further discuss procedure scedhuled at the hospital on 05-24-24 with Dr Yvone Herd.  Will continue efforts.   Truddie Furrow, MD  Diantha Fossa, CMA; Lajuan Pila, MD; Edmonia Gottron, PA-C Happy to do the procedures-please add them to my schedule  Haskell Linker       Previous Messages    ----- Message ----- From: Diantha Fossa, CMA Sent: 04/05/2024   4:55 PM EDT To: Edmonia Gottron, PA-C; Lajuan Pila, MD; Na* Subject: ECL                                            Good afternoon Dr Yvone Herd,  This patient was seen by Santina Cull earlier this week.  Dr Venice Gillis agreed that patient will need to have an ECL at the hospital.  Dr Venice Gillis does not have any openings in June, July, or August.  We do not have the September schedule out at this time.  Dr Yvone Herd, you have an opening on 05-24-24. I wanted to see if you would agree to doing this procedure for Dr Venice Gillis?  Thank you, Peterson Brandt ----- Message ----- From: Devorah Fonder Sent: 04/04/2024   8:29 AM EDT To: Diantha Fossa, CMA  I talked with Dr. Venice Gillis he said go ahead and schedule for EGD colonoscopy after CT scan at [facility] patient has heart failure severe MR, suggest cardiac clearance as well.

## 2024-04-06 NOTE — Progress Notes (Signed)
 ReDS Vest / Clip - 04/06/24 1200       ReDS Vest / Clip   Station Marker A    Ruler Value 24    ReDS Value Range Low volume    ReDS Actual Value 35

## 2024-04-06 NOTE — Telephone Encounter (Signed)
 [facility] Pre-operative Risk Assessment     Request for surgical clearance:     Endoscopy Procedure  What type of surgery is being performed?    Endoscopy and colonoscopy  When is this surgery scheduled?     05-24-24  What type of clearance is required ?   Cardiac Clearance  Practice name and name of physician performing surgery?      [facility]  What is your office phone and fax number?      Phone- [phone]  Fax- [phone]  Anesthesia type (None, local, MAC, general) ?       MAC   Please route your response to Tanner Fanny, CMA

## 2024-04-06 NOTE — Telephone Encounter (Signed)
   Patient Name: Vanessa Johnson  DOB: June 03, 1936 MRN: [id number]  Primary Cardiologist: Nelia Balzarine, MD  Chart reviewed as part of pre-operative protocol coverage. She was seen by Vernia Good NP on 6/13. Per Camilo Cella, She is at an acceptable CV risk for EGD/colonoscopy.  Regarding ASA therapy, we recommend continuation of ASA throughout the perioperative period.  However, if the surgeon feels that cessation of ASA is required in the perioperative period, it may be stopped 5-7 days prior to surgery with a plan to resume it as soon as felt to be feasible from a surgical standpoint in the post-operative period.    Debria Fang, PA-C 04/06/2024, 4:22 PM

## 2024-04-06 NOTE — Telephone Encounter (Signed)
 Patient returned call to office was instructed that she has been scheduled for an EGD and colonoscopy with Dr Yvone Herd on 05-24-24 at [facility].  Patient advised of instructions and aware that a copy of her instructions will be mailed to her home.  Patient agreed to plan and verbalized understanding.  No further questions or concerns.

## 2024-04-06 NOTE — Patient Instructions (Addendum)
 Good to see you today!  Labs done today, your results will be available in MyChart, we will contact you for abnormal readings.  Your physician recommends that you schedule a follow-up appointment 6 months with Dr. McLean(December)Call office in October to schedule an appointment  If you have any questions or concerns before your next appointment please send us  a message through [place] or call our office at [phone].    TO LEAVE A MESSAGE FOR THE NURSE SELECT OPTION 2, PLEASE LEAVE A MESSAGE INCLUDING: YOUR NAME DATE OF BIRTH CALL BACK NUMBER REASON FOR CALL**this is important as we prioritize the call backs  YOU WILL RECEIVE A CALL BACK THE SAME DAY AS LONG AS YOU CALL BEFORE 4:00 PM At the [facility], you and your health needs are our priority. As part of our continuing mission to provide you with exceptional heart care, we have created designated Provider Care Teams. These Care Teams include your primary Cardiologist (physician) and Advanced Practice Providers (APPs- Physician Assistants and Nurse Practitioners) who all work together to provide you with the care you need, when you need it.   You may see any of the following providers on your designated Care Team at your next follow up: Dr Jules Oar Dr Peder Bourdon Dr. Alwin Baars Dr. Arta Lark Amy Marijane Shoulders, NP Ruddy Corral, [place] [facility] [place], [place] Dennise Fitz, NP [place] Lee, NP Shawnee Dellen, NP Luster Salters, PharmD Bevely Brush, PharmD   Please be sure to bring in all your medications bottles to every appointment.    Thank you for choosing [facility]

## 2024-04-09 ENCOUNTER — Encounter: Payer: Self-pay | Admitting: Physician Assistant

## 2024-04-09 NOTE — Telephone Encounter (Signed)
 Inbound call from patient states she is returning a call from last Friday? Please advise.   Thank you

## 2024-04-09 NOTE — Telephone Encounter (Signed)
Inbound call from patient states she is returning call.   Please advise.

## 2024-04-09 NOTE — Telephone Encounter (Signed)
 Left message for patient to return call and ask to speak with [facility] or myself.  Will continue efforts.

## 2024-04-09 NOTE — Telephone Encounter (Signed)
 Patient advised that she has been cleared by cardiology to proceed with EGD and Colonoscopy scheduled for 05-24-24 at [facility].  Patient agreed to plan and verbalized understanding.  No further questions.

## 2024-04-16 DIAGNOSIS — L578 Other skin changes due to chronic exposure to nonionizing radiation: Secondary | ICD-10-CM | POA: Diagnosis not present

## 2024-04-16 DIAGNOSIS — L570 Actinic keratosis: Secondary | ICD-10-CM | POA: Diagnosis not present

## 2024-04-16 DIAGNOSIS — L821 Other seborrheic keratosis: Secondary | ICD-10-CM | POA: Diagnosis not present

## 2024-04-16 DIAGNOSIS — R233 Spontaneous ecchymoses: Secondary | ICD-10-CM | POA: Diagnosis not present

## 2024-04-17 DIAGNOSIS — E559 Vitamin D deficiency, unspecified: Secondary | ICD-10-CM | POA: Diagnosis not present

## 2024-04-17 DIAGNOSIS — I10 Essential (primary) hypertension: Secondary | ICD-10-CM | POA: Diagnosis not present

## 2024-04-17 DIAGNOSIS — K219 Gastro-esophageal reflux disease without esophagitis: Secondary | ICD-10-CM | POA: Diagnosis not present

## 2024-04-17 DIAGNOSIS — R413 Other amnesia: Secondary | ICD-10-CM | POA: Diagnosis not present

## 2024-04-17 DIAGNOSIS — J45909 Unspecified asthma, uncomplicated: Secondary | ICD-10-CM | POA: Diagnosis not present

## 2024-04-17 DIAGNOSIS — Z79899 Other long term (current) drug therapy: Secondary | ICD-10-CM | POA: Diagnosis not present

## 2024-04-17 DIAGNOSIS — E1140 Type 2 diabetes mellitus with diabetic neuropathy, unspecified: Secondary | ICD-10-CM | POA: Diagnosis not present

## 2024-04-17 DIAGNOSIS — E785 Hyperlipidemia, unspecified: Secondary | ICD-10-CM | POA: Diagnosis not present

## 2024-04-17 DIAGNOSIS — R5383 Other fatigue: Secondary | ICD-10-CM | POA: Diagnosis not present

## 2024-04-17 DIAGNOSIS — Z6827 Body mass index (BMI) 27.0-27.9, adult: Secondary | ICD-10-CM | POA: Diagnosis not present

## 2024-04-17 DIAGNOSIS — Z8679 Personal history of other diseases of the circulatory system: Secondary | ICD-10-CM | POA: Diagnosis not present

## 2024-04-17 DIAGNOSIS — E039 Hypothyroidism, unspecified: Secondary | ICD-10-CM | POA: Diagnosis not present

## 2024-04-23 ENCOUNTER — Ambulatory Visit: Payer: Self-pay | Admitting: Internal Medicine

## 2024-04-23 LAB — CUP PACEART REMOTE DEVICE CHECK
Battery Remaining Longevity: 49 mo
Battery Remaining Percentage: 51 %
Battery Voltage: 2.98 V
Brady Statistic AP VP Percent: 1.7 %
Brady Statistic AP VS Percent: 1 %
Brady Statistic AS VP Percent: 98 %
Brady Statistic AS VS Percent: 1 %
Brady Statistic RA Percent Paced: 1.3 %
Date Time Interrogation Session: 20250630020010
Implantable Lead Connection Status: 753985
Implantable Lead Connection Status: 753985
Implantable Lead Connection Status: 753985
Implantable Lead Implant Date: 20210701
Implantable Lead Implant Date: 20210701
Implantable Lead Implant Date: 20210701
Implantable Lead Location: 753858
Implantable Lead Location: 753859
Implantable Lead Location: 753860
Implantable Pulse Generator Implant Date: 20210701
Lead Channel Impedance Value: 410 Ohm
Lead Channel Impedance Value: 530 Ohm
Lead Channel Impedance Value: 950 Ohm
Lead Channel Pacing Threshold Amplitude: 0.75 V
Lead Channel Pacing Threshold Amplitude: 0.75 V
Lead Channel Pacing Threshold Amplitude: 0.75 V
Lead Channel Pacing Threshold Pulse Width: 0.5 ms
Lead Channel Pacing Threshold Pulse Width: 0.5 ms
Lead Channel Pacing Threshold Pulse Width: 0.5 ms
Lead Channel Sensing Intrinsic Amplitude: 12 mV
Lead Channel Sensing Intrinsic Amplitude: 4.1 mV
Lead Channel Setting Pacing Amplitude: 2 V
Lead Channel Setting Pacing Amplitude: 2 V
Lead Channel Setting Pacing Amplitude: 2 V
Lead Channel Setting Pacing Pulse Width: 0.5 ms
Lead Channel Setting Pacing Pulse Width: 0.5 ms
Lead Channel Setting Sensing Sensitivity: 2 mV
Pulse Gen Model: 3562
Pulse Gen Serial Number: 3818336

## 2024-04-23 NOTE — Telephone Encounter (Signed)
 Patient requesting to have procedure moved a week ahead or a week after . States daughter would like to attend. Please advise

## 2024-04-24 NOTE — Telephone Encounter (Signed)
Left message for patient to return call to our office to further discuss.  Will continue efforts.

## 2024-04-25 NOTE — Telephone Encounter (Signed)
 Spoke with patient and made her aware that due to limited availability at the hospital with all providers schedules, we had already scheduled her with one of Dr Ira partners to get her in sooner.  Patient verbalized understanding and no longer wished to move appointment date and time.

## 2024-04-28 ENCOUNTER — Encounter: Payer: Self-pay | Admitting: Cardiology

## 2024-04-30 ENCOUNTER — Encounter (HOSPITAL_COMMUNITY)
Admission: RE | Disposition: A | Discharge status: Home / Self Care | Payer: Self-pay | Source: Home / Self Care | Attending: Pediatrics

## 2024-04-30 ENCOUNTER — Encounter: Payer: Self-pay | Admitting: Internal Medicine

## 2024-04-30 SURGERY — EGD (ESOPHAGOGASTRODUODENOSCOPY)
Anesthesia: Monitor Anesthesia Care

## 2024-05-07 ENCOUNTER — Ambulatory Visit: Attending: Internal Medicine

## 2024-05-07 DIAGNOSIS — I5022 Chronic systolic (congestive) heart failure: Secondary | ICD-10-CM | POA: Diagnosis not present

## 2024-05-07 DIAGNOSIS — Z950 Presence of cardiac pacemaker: Secondary | ICD-10-CM

## 2024-05-10 NOTE — Progress Notes (Signed)
 [facility]  [address] [place],  [place]  [address] [phone]  Clinic Day:  05/11/2024  Referring physician: Gable Cambric, MD   HISTORY OF PRESENT ILLNESS:  Vanessa Johnson is a 88 y.o. female with a history of iron deficiency anemia.  She comes in today to reassess her iron and hemoglobin levels after receiving another course of IV iron in May 2025.  Vanessa Johnson has felt much better since her last course of IV iron was given.  She denies having any overt forms of blood loss since her last visit.  She did have an upper endoscopy in September 2024, for which nothing ominous was seen.  However, due to her recurrent/persistent episodes of iron deficiency anemia, she is scheduled for another EGD and colonoscopy at Vanessa end of this month.    PHYSICAL EXAM:  Blood pressure (!) 162/72, pulse 78, temperature 98.2 F (36.8 C), temperature source Oral, resp. rate 14, height 5' 1 (1.549 m), weight 147 lb 12.8 oz (67 kg), SpO2 97%. Wt Readings from Last 3 Encounters:  05/11/24 147 lb 12.8 oz (67 kg)  04/06/24 150 lb 6.4 oz (68.2 kg)  04/03/24 150 lb (68 kg)   Body mass index is 27.93 kg/m. Performance status (ECOG): 1 - Symptomatic but completely ambulatory Physical Exam Constitutional:      Appearance: Normal appearance. She is not ill-appearing.  HENT:     Mouth/Throat:     Mouth: Mucous membranes are moist.     Pharynx: Oropharynx is clear. No oropharyngeal exudate or posterior oropharyngeal erythema.  Cardiovascular:     Rate and Rhythm: Normal rate and regular rhythm.     Heart sounds: No murmur heard.    No friction rub. No gallop.  Pulmonary:     Effort: Pulmonary effort is normal. No respiratory distress.     Breath sounds: Normal breath sounds. No wheezing, rhonchi or rales.  Abdominal:     General: Bowel sounds are normal. There is no distension.     Palpations: Abdomen is soft. There is no mass.     Tenderness: There is no abdominal  tenderness.  Musculoskeletal:        General: No swelling.     Right lower leg: No edema.     Left lower leg: No edema.  Lymphadenopathy:     Cervical: No cervical adenopathy.     Upper Body:     Right upper body: No supraclavicular or axillary adenopathy.     Left upper body: No supraclavicular or axillary adenopathy.     Lower Body: No right inguinal adenopathy. No left inguinal adenopathy.  Skin:    General: Skin is warm.     Coloration: Skin is not jaundiced.     Findings: No lesion or rash.  Neurological:     General: No focal deficit present.     Mental Status: She is alert and oriented to person, place, and time. Mental status is at baseline.  Psychiatric:        Mood and Affect: Mood normal.        Behavior: Behavior normal.        Thought Content: Thought content normal.    LABS:      Latest Ref Rng & Units 05/11/2024    1:46 PM 04/03/2024    3:21 PM 03/09/2024    1:59 PM  CBC  WBC 4.0 - 10.5 K/uL 7.4  7.2  9.9   Hemoglobin 12.0 - 15.0 g/dL 13.1  12.2  9.5   Hematocrit 36.0 - 46.0 % 39.9  37.6  29.7   Platelets 150 - 400 K/uL 370  477.0 Repeated and verified X2.  564       Latest Ref Rng & Units 05/11/2024    1:46 PM 04/06/2024   12:30 PM 04/03/2024    3:21 PM  CMP  Glucose 70 - 99 mg/dL 887  881  848   BUN 8 - 23 mg/dL 24  20  26    Creatinine 0.44 - 1.00 mg/dL 8.79  8.56  8.59   Sodium 135 - 145 mmol/L 137  136  134   Potassium 3.5 - 5.1 mmol/L 3.9  3.5  3.7   Chloride 98 - 111 mmol/L 99  98  96   CO2 22 - 32 mmol/L 24  24  28    Calcium  8.9 - 10.3 mg/dL 9.8  9.4  89.8   Total Protein 6.5 - 8.1 g/dL 7.5   7.6   Total Bilirubin 0.0 - 1.2 mg/dL 0.5   0.6   Alkaline Phos 38 - 126 U/L 92   66   AST 15 - 41 U/L 29   26   ALT 0 - 44 U/L 24   26     Latest Reference Range & Units 05/11/24 13:49  Iron 28 - 170 ug/dL 50  UIBC ug/dL 563  TIBC 749 - 549 ug/dL 513 (H)  Saturation Ratios 10.4 - 31.8 % 10 (L)  Ferritin 11 - 307 ng/mL 15  (H): Data is abnormally  high (L): Data is abnormally low  ASSESSMENT & PLAN:  Assessment/Plan:  An 88 y.o. female with iron deficiency anemia.  I am very pleased with her hemoglobin today, which is Vanessa highest it has been in numerous months.  Her iron parameters still show degree of iron deficiency.  Based upon this, I will arrange for her to receive another course of IV iron over Vanessa next few weeks.  Although this is a repeated course of IV iron, her numbers are clearly trending in Vanessa right direction.  As mentioned previously, she is scheduled for a repeat GI workup later this month.  Clinically, Vanessa Johnson is doing much better.  I will see her back in 4 months for repeat clinical assessment.  Vanessa Johnson understands all Vanessa plans discussed today and is in agreement with them.    Natalynn Pedone DELENA Kerns, MD

## 2024-05-11 ENCOUNTER — Telehealth: Payer: Self-pay

## 2024-05-11 ENCOUNTER — Inpatient Hospital Stay: Attending: Oncology

## 2024-05-11 ENCOUNTER — Inpatient Hospital Stay (HOSPITAL_BASED_OUTPATIENT_CLINIC_OR_DEPARTMENT_OTHER): Admitting: Oncology

## 2024-05-11 ENCOUNTER — Other Ambulatory Visit: Payer: Self-pay | Admitting: Oncology

## 2024-05-11 ENCOUNTER — Telehealth: Payer: Self-pay | Admitting: Oncology

## 2024-05-11 VITALS — BP 162/72 | HR 78 | Temp 98.2°F | Resp 14 | Ht 61.0 in | Wt 147.8 lb

## 2024-05-11 DIAGNOSIS — D509 Iron deficiency anemia, unspecified: Secondary | ICD-10-CM | POA: Insufficient documentation

## 2024-05-11 DIAGNOSIS — D500 Iron deficiency anemia secondary to blood loss (chronic): Secondary | ICD-10-CM | POA: Diagnosis not present

## 2024-05-11 LAB — CMP (CANCER CENTER ONLY)
ALT: 24 U/L (ref 0–44)
AST: 29 U/L (ref 15–41)
Albumin: 4.4 g/dL (ref 3.5–5.0)
Alkaline Phosphatase: 92 U/L (ref 38–126)
Anion gap: 15 (ref 5–15)
BUN: 24 mg/dL — ABNORMAL HIGH (ref 8–23)
CO2: 24 mmol/L (ref 22–32)
Calcium: 9.8 mg/dL (ref 8.9–10.3)
Chloride: 99 mmol/L (ref 98–111)
Creatinine: 1.2 mg/dL — ABNORMAL HIGH (ref 0.44–1.00)
GFR, Estimated: 44 mL/min — ABNORMAL LOW (ref >60)
Glucose, Bld: 112 mg/dL — ABNORMAL HIGH (ref 70–99)
Potassium: 3.9 mmol/L (ref 3.5–5.1)
Sodium: 137 mmol/L (ref 135–145)
Total Bilirubin: 0.5 mg/dL (ref 0.0–1.2)
Total Protein: 7.5 g/dL (ref 6.5–8.1)

## 2024-05-11 LAB — CBC WITH DIFFERENTIAL (CANCER CENTER ONLY)
Abs Immature Granulocytes: 0.03 K/uL (ref 0.00–0.07)
Basophils Absolute: 0.1 K/uL (ref 0.0–0.1)
Basophils Relative: 1 %
Eosinophils Absolute: 0.3 K/uL (ref 0.0–0.5)
Eosinophils Relative: 4 %
HCT: 39.9 % (ref 36.0–46.0)
Hemoglobin: 13.1 g/dL (ref 12.0–15.0)
Immature Granulocytes: 0 %
Lymphocytes Relative: 25 %
Lymphs Abs: 1.8 K/uL (ref 0.7–4.0)
MCH: 28.2 pg (ref 26.0–34.0)
MCHC: 32.8 g/dL (ref 30.0–36.0)
MCV: 86 fL (ref 80.0–100.0)
Monocytes Absolute: 0.8 K/uL (ref 0.1–1.0)
Monocytes Relative: 10 %
Neutro Abs: 4.5 K/uL (ref 1.7–7.7)
Neutrophils Relative %: 60 %
Platelet Count: 370 K/uL (ref 150–400)
RBC: 4.64 MIL/uL (ref 3.87–5.11)
RDW: 16.4 % — ABNORMAL HIGH (ref 11.5–15.5)
WBC Count: 7.4 K/uL (ref 4.0–10.5)
nRBC: 0 % (ref 0.0–0.2)

## 2024-05-11 LAB — IRON AND TIBC
Iron: 50 ug/dL (ref 28–170)
Saturation Ratios: 10 % — ABNORMAL LOW (ref 10.4–31.8)
TIBC: 486 ug/dL — ABNORMAL HIGH (ref 250–450)
UIBC: 436 ug/dL

## 2024-05-11 LAB — FERRITIN: Ferritin: 15 ng/mL (ref 11–307)

## 2024-05-11 NOTE — Telephone Encounter (Signed)
 Remote ICM transmission received.  Attempted call to patient regarding ICM remote transmission and left detailed message per DPR.  Left ICM phone number and advised to return call for any fluid symptoms or questions. Next ICM remote transmission scheduled 06/11/2024.

## 2024-05-11 NOTE — Telephone Encounter (Signed)
 Patient has been scheduled for follow-up visit per 05/11/24 LOS.  Pt given an appt calendar with date and time.

## 2024-05-11 NOTE — Progress Notes (Signed)
 EPIC Encounter for ICM Monitoring  Patient Name: Vanessa Johnson is a 88 y.o. female Date: 05/11/2024 Primary Care Physican: Gable Cambric, MD Primary Cardiologist: Revankar/McLean Electrophysiologist: Waddell Pore Pacing: >99%          09/28/2023 Weight: 151-154 lbs  11/02/2023 Weight: 156 lbs 12/07/2023 Weight: 155 lbs       01/11/2024 Weight: 154.2 lbs (153-157 lbs)  02/14/2024 Weight: 151-154 lbs                                               Attempted call to patient and unable to reach.  Left detailed message per DPR regarding transmission.  Transmission results reviewed.      Diet:  [employer] foods frequently.  Not strict on foods that already contain salt.    CorVue thoracic impedance suggesting normal fluid levels within last month.   Prescribed:  Furosemide  40 mg take 2 tablet(s) (80 mg total) by mouth twice a day.  Spironolactone  25 mg take 2 tablets (50 mg total) by mouth at bedtime.   Labs: 05/11/2024 Creatinine 1.20, BUN 24, Potassium 3.9, Sodium 137, GFR 44  04/06/2024 Creatinine 1.43, BUN 20, Potassium 3.5, Sodium 136 04/03/2024 Creatinine 1.40, BUN 26, Potassium 3.7, Sodium 134 11/25/2023 Creatinine 1.34, BUN 21, Potassium 4.6, Sodium 139, GFR 39  11/11/2023 Creatinine 1.17, BUN 26, Potassium 3.4, Sodium 140  A complete set of results can be found in Results Review.   Recommendations: Left voice mail with ICM number and encouraged to call if experiencing any fluid symptoms.   Follow-up plan: ICM clinic phone appointment on 06/11/2024.   91 day device clinic remote transmission 07/23/2024.     EP/Cardiology Office Visits:  Recall 10/03/2024 with HF clinic.  Recall 04/15/2024 with Dr Waddell.   05/24/2024 with Dr Edwyna.   Copy of ICM check sent to Dr. Waddell.    3 month ICM trend: 05/07/2024.    12-14 Month ICM trend:     [name], RN 05/11/2024 3:59 PM

## 2024-05-12 ENCOUNTER — Encounter: Payer: Self-pay | Admitting: Oncology

## 2024-05-14 ENCOUNTER — Encounter: Payer: Self-pay | Admitting: Oncology

## 2024-05-14 ENCOUNTER — Telehealth: Payer: Self-pay | Admitting: Oncology

## 2024-05-14 ENCOUNTER — Telehealth: Payer: Self-pay | Admitting: Pediatrics

## 2024-05-14 NOTE — Telephone Encounter (Signed)
 Inbound call from patient stating she would like the coding of her upcoming procedure 05/24/2024 at [facility] with Dr.McGreal  Requesting a call back to make sure her insurance would cove   Please advise  Thank you

## 2024-05-14 NOTE — Telephone Encounter (Signed)
 Patient has been scheduled. Aware of appt date and time.

## 2024-05-14 NOTE — Telephone Encounter (Signed)
 Contacted pt to schedule an appt. Unable to reach via phone, voicemail was left.    Per Valaria Kerns, MD: please let pt know she is still mildly iron deficient. have her receive IV iron over these next few weeks....thx

## 2024-05-16 ENCOUNTER — Telehealth: Payer: Self-pay | Admitting: Gastroenterology

## 2024-05-16 ENCOUNTER — Inpatient Hospital Stay

## 2024-05-16 VITALS — BP 104/62 | HR 76 | Temp 97.9°F | Resp 16

## 2024-05-16 DIAGNOSIS — D509 Iron deficiency anemia, unspecified: Secondary | ICD-10-CM

## 2024-05-16 MED ORDER — SODIUM CHLORIDE 0.9 % IV SOLN
1000.0000 mg | Freq: Once | INTRAVENOUS | Status: AC
  Administered 2024-05-16: 1000 mg via INTRAVENOUS
  Filled 2024-05-16: qty 10

## 2024-05-16 MED ORDER — SODIUM CHLORIDE 0.9 % IV SOLN
INTRAVENOUS | Status: DC
Start: 2024-05-16 — End: 2024-05-16

## 2024-05-16 NOTE — Patient Instructions (Signed)

## 2024-05-16 NOTE — Telephone Encounter (Signed)
 Procedure:Endoscopy/Colonoscopy Procedure date: 05/24/24 Procedure location: [facility] Arrival Time: 7:15 am Spoke with the patient Y/N: Yes Any prep concerns? No  Has the patient obtained the prep from the pharmacy ? Yes Do you have a care partner and transportation: Yes Any additional concerns? No

## 2024-05-17 ENCOUNTER — Encounter (HOSPITAL_COMMUNITY): Payer: Self-pay | Admitting: Pediatrics

## 2024-05-21 ENCOUNTER — Other Ambulatory Visit (HOSPITAL_COMMUNITY): Payer: Self-pay | Admitting: Cardiology

## 2024-05-24 ENCOUNTER — Ambulatory Visit: Admitting: Cardiology

## 2024-05-24 ENCOUNTER — Ambulatory Visit (HOSPITAL_COMMUNITY)
Admission: RE | Admit: 2024-05-24 | Discharge: 2024-05-24 | Disposition: A | Discharge status: Home / Self Care | Attending: Pediatrics | Admitting: Pediatrics

## 2024-05-24 ENCOUNTER — Other Ambulatory Visit: Payer: Self-pay

## 2024-05-24 ENCOUNTER — Encounter (HOSPITAL_COMMUNITY)
Admission: RE | Disposition: A | Discharge status: Home / Self Care | Payer: Self-pay | Source: Home / Self Care | Attending: Pediatrics

## 2024-05-24 ENCOUNTER — Ambulatory Visit (HOSPITAL_COMMUNITY): Payer: Self-pay

## 2024-05-24 ENCOUNTER — Encounter (HOSPITAL_COMMUNITY): Payer: Self-pay | Admitting: Pediatrics

## 2024-05-24 DIAGNOSIS — Z87891 Personal history of nicotine dependence: Secondary | ICD-10-CM | POA: Diagnosis not present

## 2024-05-24 DIAGNOSIS — J45909 Unspecified asthma, uncomplicated: Secondary | ICD-10-CM | POA: Diagnosis not present

## 2024-05-24 DIAGNOSIS — E119 Type 2 diabetes mellitus without complications: Secondary | ICD-10-CM | POA: Diagnosis not present

## 2024-05-24 DIAGNOSIS — D122 Benign neoplasm of ascending colon: Secondary | ICD-10-CM

## 2024-05-24 DIAGNOSIS — Z7982 Long term (current) use of aspirin: Secondary | ICD-10-CM | POA: Diagnosis not present

## 2024-05-24 DIAGNOSIS — D509 Iron deficiency anemia, unspecified: Secondary | ICD-10-CM

## 2024-05-24 DIAGNOSIS — I110 Hypertensive heart disease with heart failure: Secondary | ICD-10-CM

## 2024-05-24 DIAGNOSIS — Z860101 Personal history of adenomatous and serrated colon polyps: Secondary | ICD-10-CM

## 2024-05-24 DIAGNOSIS — G4733 Obstructive sleep apnea (adult) (pediatric): Secondary | ICD-10-CM | POA: Diagnosis not present

## 2024-05-24 DIAGNOSIS — I509 Heart failure, unspecified: Secondary | ICD-10-CM | POA: Diagnosis not present

## 2024-05-24 DIAGNOSIS — K51419 Inflammatory polyps of colon with unspecified complications: Secondary | ICD-10-CM | POA: Diagnosis not present

## 2024-05-24 DIAGNOSIS — I2510 Atherosclerotic heart disease of native coronary artery without angina pectoris: Secondary | ICD-10-CM | POA: Insufficient documentation

## 2024-05-24 DIAGNOSIS — K219 Gastro-esophageal reflux disease without esophagitis: Secondary | ICD-10-CM | POA: Insufficient documentation

## 2024-05-24 DIAGNOSIS — K31811 Angiodysplasia of stomach and duodenum with bleeding: Secondary | ICD-10-CM | POA: Diagnosis not present

## 2024-05-24 DIAGNOSIS — E039 Hypothyroidism, unspecified: Secondary | ICD-10-CM | POA: Diagnosis not present

## 2024-05-24 DIAGNOSIS — K5909 Other constipation: Secondary | ICD-10-CM | POA: Diagnosis not present

## 2024-05-24 DIAGNOSIS — Z79890 Hormone replacement therapy (postmenopausal): Secondary | ICD-10-CM | POA: Insufficient documentation

## 2024-05-24 DIAGNOSIS — B3781 Candidal esophagitis: Secondary | ICD-10-CM | POA: Diagnosis not present

## 2024-05-24 DIAGNOSIS — K31819 Angiodysplasia of stomach and duodenum without bleeding: Secondary | ICD-10-CM | POA: Insufficient documentation

## 2024-05-24 DIAGNOSIS — Z7984 Long term (current) use of oral hypoglycemic drugs: Secondary | ICD-10-CM | POA: Diagnosis not present

## 2024-05-24 DIAGNOSIS — K644 Residual hemorrhoidal skin tags: Secondary | ICD-10-CM

## 2024-05-24 DIAGNOSIS — K3189 Other diseases of stomach and duodenum: Secondary | ICD-10-CM

## 2024-05-24 DIAGNOSIS — E782 Mixed hyperlipidemia: Secondary | ICD-10-CM | POA: Insufficient documentation

## 2024-05-24 DIAGNOSIS — Z79899 Other long term (current) drug therapy: Secondary | ICD-10-CM | POA: Diagnosis not present

## 2024-05-24 DIAGNOSIS — K317 Polyp of stomach and duodenum: Secondary | ICD-10-CM

## 2024-05-24 DIAGNOSIS — K648 Other hemorrhoids: Secondary | ICD-10-CM | POA: Diagnosis not present

## 2024-05-24 DIAGNOSIS — K635 Polyp of colon: Secondary | ICD-10-CM | POA: Diagnosis not present

## 2024-05-24 DIAGNOSIS — K5140 Inflammatory polyps of colon without complications: Secondary | ICD-10-CM

## 2024-05-24 DIAGNOSIS — D131 Benign neoplasm of stomach: Secondary | ICD-10-CM | POA: Diagnosis not present

## 2024-05-24 DIAGNOSIS — K5904 Chronic idiopathic constipation: Secondary | ICD-10-CM | POA: Diagnosis not present

## 2024-05-24 DIAGNOSIS — Z833 Family history of diabetes mellitus: Secondary | ICD-10-CM | POA: Diagnosis not present

## 2024-05-24 DIAGNOSIS — K2100 Gastro-esophageal reflux disease with esophagitis, without bleeding: Secondary | ICD-10-CM | POA: Diagnosis not present

## 2024-05-24 HISTORY — PX: COLONOSCOPY: SHX5424

## 2024-05-24 HISTORY — PX: ESOPHAGOGASTRODUODENOSCOPY: SHX5428

## 2024-05-24 LAB — GLUCOSE, CAPILLARY: Glucose-Capillary: 121 mg/dL — ABNORMAL HIGH (ref 70–99)

## 2024-05-24 SURGERY — EGD (ESOPHAGOGASTRODUODENOSCOPY)
Anesthesia: Monitor Anesthesia Care

## 2024-05-24 MED ORDER — PROPOFOL 500 MG/50ML IV EMUL
INTRAVENOUS | Status: DC | PRN
Start: 2024-05-24 — End: 2024-05-24
  Administered 2024-05-24: 30 mg via INTRAVENOUS
  Administered 2024-05-24: 80 ug/kg/min via INTRAVENOUS
  Administered 2024-05-24: 30 mg via INTRAVENOUS
  Administered 2024-05-24 (×3): 40 mg via INTRAVENOUS

## 2024-05-24 MED ORDER — SODIUM CHLORIDE 0.9 % IV SOLN
INTRAVENOUS | Status: AC | PRN
  Administered 2024-05-24: 500 mL via INTRAMUSCULAR

## 2024-05-24 MED ORDER — PROPOFOL 500 MG/50ML IV EMUL
INTRAVENOUS | Status: AC
  Filled 2024-05-24: qty 50

## 2024-05-24 MED ORDER — LIDOCAINE 2% (20 MG/ML) 5 ML SYRINGE
INTRAMUSCULAR | Status: DC | PRN
  Administered 2024-05-24: 70 mg via INTRAVENOUS

## 2024-05-24 NOTE — Op Note (Signed)
 [facility] Patient Name: Vanessa Johnson Procedure Date: 05/24/2024 MRN: [id number] Attending MD: Inocente Hausen , MD, [id number] Date of Birth: 04-May-1936 CSN: [id number] Age: 88 Admit Type: Outpatient Procedure:                Colonoscopy Indications:              Last colonoscopy: 2016, Iron deficiency anemia Providers:                Inocente Hausen, MD, Hoy Penner, RN, Curtistine Bishop, Technician Referring MD:             Lynnie Bring, MD Medicines:                Monitored Anesthesia Care Complications:            No immediate complications. Estimated blood loss:                            Minimal. Estimated Blood Loss:     Estimated blood loss was minimal. Procedure:                Pre-Anesthesia Assessment:                           - Prior to the procedure, a History and Physical                            was performed, and patient medications and                            allergies were reviewed. The patient's tolerance of                            previous anesthesia was also reviewed. The risks                            and benefits of the procedure and the sedation                            options and risks were discussed with the patient.                            All questions were answered, and informed consent                            was obtained. Prior Anticoagulants: The patient has                            taken no anticoagulant or antiplatelet agents                            except for aspirin . ASA Grade Assessment: III - A  patient with severe systemic disease. After                            reviewing the risks and benefits, the patient was                            deemed in satisfactory condition to undergo the                            procedure.                           After obtaining informed consent, the colonoscope                            was passed under direct  vision. Throughout the                            procedure, the patient's blood pressure, pulse, and                            oxygen saturations were monitored continuously. The                            [id number]-HQ190L ([id number] Olympus colonoscope was                            introduced through the anus and advanced to the                            terminal ileum. The colonoscopy was performed                            without difficulty. The patient tolerated the                            procedure well. The quality of the bowel                            preparation was good. The terminal ileum, ileocecal                            valve, appendiceal orifice, and rectum were                            photographed. Scope In: 9:46:53 AM Scope Out: 10:19:19 AM Scope Withdrawal Time: 0 hours 27 minutes 26 seconds  Total Procedure Duration: 0 hours 32 minutes 26 seconds  Findings:      Hemorrhoids were found on perianal exam.      The digital rectal exam was normal. Pertinent negatives include normal       sphincter tone and no palpable rectal lesions.      A 6 mm polyp was found in the ascending colon. The polyp was sessile.       The polyp was removed with a cold snare.  Resection and retrieval were       complete.      A 10 mm polyp was found in the descending colon. The polyp was sessile       and had overlying ulceration. The polyp was removed with a hot snare.       Resection and retrieval were complete.      A 4 mm polyp was found in the descending colon. The polyp was sessile.       The polyp was removed with a cold biopsy forceps. Resection and       retrieval were complete.      Two sessile polyps were found in the recto-sigmoid colon. The polyps       were 4 mm in size. These polyps were removed with a cold biopsy forceps.       Resection and retrieval were complete.      Internal hemorrhoids were found during retroflexion.      The terminal ileum appeared  normal. Impression:               - Hemorrhoids found on perianal exam.                           - One 6 mm polyp in the ascending colon, removed                            with a cold snare. Resected and retrieved.                           - One 10 mm polyp in the descending colon, removed                            with a hot snare. Resected and retrieved. The polyp                            had overlying ulceration -may be an inflammatory                            polyp. This could potentially contribute to iron                            deficiency anemia.                           - One 4 mm polyp in the descending colon, removed                            with a cold biopsy forceps. Resected and retrieved.                           - Two 4 mm polyps at the recto-sigmoid colon,                            removed with a cold biopsy forceps. Resected and  retrieved.                           - Internal hemorrhoids.                           - The examined portion of the ileum was normal. Moderate Sedation:      Not Applicable - Patient had care per Anesthesia. Recommendation:           - Discharge patient to home (ambulatory).                           - Await pathology results.                           - The findings and recommendations were discussed                            with the patient's family.                           - Return to referring physician.                           - Patient has a contact number available for                            emergencies. The signs and symptoms of potential                            delayed complications were discussed with the                            patient. Return to normal activities tomorrow.                            Written discharge instructions were provided to the                            patient. Procedure Code(s):        --- Professional ---                           [phone],  Colonoscopy, flexible; with removal of                            tumor(s), polyp(s), or other lesion(s) by snare                            technique                           45380, 59, Colonoscopy, flexible; with biopsy,                            single or multiple Diagnosis Code(s):        --- Professional ---  D12.2, Benign neoplasm of ascending colon                           D12.4, Benign neoplasm of descending colon                           D12.7, Benign neoplasm of rectosigmoid junction                           K64.8, Other hemorrhoids                           D50.9, Iron deficiency anemia, unspecified CPT copyright 2022 [employer]. All rights reserved. The codes documented in this report are preliminary and upon coder review may  be revised to meet current compliance requirements. Inocente Hausen, MD 05/24/2024 10:39:24 AM This report has been signed electronically. Number of Addenda: 0

## 2024-05-24 NOTE — Op Note (Signed)
 [facility] Patient Name: Vanessa Johnson Procedure Date: 05/24/2024 MRN: [id number] Attending MD: Inocente Hausen , MD, [id number] Date of Birth: 1936-09-10 CSN: [id number] Age: 88 Admit Type: Inpatient Procedure:                Upper GI endoscopy Indications:              Iron deficiency anemia, Follow-up of gastric polyps Providers:                Inocente Hausen, MD, Hoy Penner, RN, Curtistine Bishop, Technician Referring MD:             Lynnie Bring, MD Medicines:                Monitored Anesthesia Care Complications:            No immediate complications. Estimated blood loss:                            Minimal. Estimated Blood Loss:     Estimated blood loss was minimal. Procedure:                Pre-Anesthesia Assessment:                           - Prior to the procedure, a History and Physical                            was performed, and patient medications and                            allergies were reviewed. The patient's tolerance of                            previous anesthesia was also reviewed. The risks                            and benefits of the procedure and the sedation                            options and risks were discussed with the patient.                            All questions were answered, and informed consent                            was obtained. Prior Anticoagulants: The patient has                            taken no anticoagulant or antiplatelet agents                            except for aspirin . ASA Grade Assessment: III - A  patient with severe systemic disease. After                            reviewing the risks and benefits, the patient was                            deemed in satisfactory condition to undergo the                            procedure.                           After obtaining informed consent, the endoscope was                            passed under  direct vision. Throughout the                            procedure, the patient's blood pressure, pulse, and                            oxygen saturations were monitored continuously. The                            [id number]-[id number] ([id number]) Olympus endoscope was introduced                            through the mouth, and advanced to the second part                            of duodenum. The upper GI endoscopy was                            accomplished without difficulty. The patient                            tolerated the procedure well. Scope In: Scope Out: Findings:      The examined esophagus was normal.      Multiple small sessile polyps with no bleeding and no stigmata of recent       bleeding were found in the gastric fundus and in the gastric body. These       had the appearance of fundic land polyps similar to prior upper       endoscopies.      Striped mildly erythematous mucosa without bleeding was found in the       prepyloric region of the stomach.      Biopsies were taken with a cold forceps in the gastric body and in the       gastric antrum for Helicobacter pylori testing.      Gastric folds in the prepyloric region appeared thickened. The polypoid       lesion with ulceration seen on upper endoscopy 06/2023 was not visualized       today. No ulcers were seen.      The cardia (on retroflexion) and gastric fundus (on retroflexion) were       normal.  The duodenal bulb was normal.      Normal mucosa was found in the second portion of the duodenum.      Three small angioectasias with bleeding on contact were found in the       second portion of the duodenum. Coagulation for tissue destruction using       argon plasma at 2 liters/minute and 20 watts was successful. Impression:               - Normal esophagus.                           - Multiple gastric polyps in the gastric fundus and                            body. These were small in size and had the                             appearance of fundic gland polyps.                           - Enlarged folds were seen in the prepyloric region                            but no evidence of a polypoid lesion with                            ulceration as seen on EGD 06/2023                           - Erythematous mucosa in the prepyloric region of                            the stomach. Biopsies were taken with a cold                            forceps for Helicobacter pylori testing.                           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 Treated with                            argon plasma coagulation (APC). This may be                            contributing to the patient's iron deficiency                            anemia. Moderate Sedation:      Not Applicable - Patient had care per Anesthesia. Recommendation:           - Await pathology results.                           - If patient has ongoing issues with iron  deficiency anemia, consider video capsule endoscopy                            to survey for additional angioectasias in the small                            bowel.                           - Perform a colonoscopy today.                           - The findings and recommendations were discussed                            with the patient's family. Procedure Code(s):        --- Professional ---                           [phone], Esophagogastroduodenoscopy, flexible,                            transoral; with biopsy, single or multiple Diagnosis Code(s):        --- Professional ---                           K31.7, Polyp of stomach and duodenum                           K31.89, Other diseases of stomach and duodenum                           K31.819, Angiodysplasia of stomach and duodenum                            without bleeding                           D50.9, Iron deficiency anemia, unspecified CPT copyright 2022 American [employer] Association. All rights  reserved. The codes documented in this report are preliminary and upon coder review may  be revised to meet current compliance requirements. Inocente Hausen, MD 05/24/2024 10:31:08 AM This report has been signed electronically. Number of Addenda: 0

## 2024-05-24 NOTE — Anesthesia Preprocedure Evaluation (Signed)
 Anesthesia Evaluation  Patient identified by MRN, date of birth, ID band Patient awake    Reviewed: Allergy & Precautions, H&P , NPO status , Patient's Chart, lab work & pertinent test results  Airway Mallampati: II   Neck ROM: full    Dental   Pulmonary asthma , sleep apnea , former smoker   breath sounds clear to auscultation       Cardiovascular hypertension, + CAD and +CHF  + dysrhythmias + pacemaker  Rhythm:regular Rate:Normal  TTE (05/2023): EF 45%, mild MR.   Neuro/Psych    GI/Hepatic ,GERD  ,,  Endo/Other  diabetes, Type 2Hypothyroidism    Renal/GU      Musculoskeletal  (+) Arthritis ,    Abdominal   Peds  Hematology   Anesthesia Other Findings   Reproductive/Obstetrics                              Anesthesia Physical Anesthesia Plan  ASA: 3  Anesthesia Plan: MAC   Post-op Pain Management:    Induction: Intravenous  PONV Risk Score and Plan: 2 and Propofol  infusion and Treatment may vary due to age or medical condition  Airway Management Planned: Nasal Cannula  Additional Equipment:   Intra-op Plan:   Post-operative Plan:   Informed Consent: I have reviewed the patients History and Physical, chart, labs and discussed the procedure including the risks, benefits and alternatives for the proposed anesthesia with the patient or authorized representative who has indicated his/her understanding and acceptance.     Dental advisory given  Plan Discussed with: CRNA, Anesthesiologist and Surgeon  Anesthesia Plan Comments:         Anesthesia Quick Evaluation

## 2024-05-24 NOTE — Anesthesia Postprocedure Evaluation (Signed)
 Anesthesia Post Note  Patient: Vanessa Johnson  Procedure(s) Performed: EGD (ESOPHAGOGASTRODUODENOSCOPY) COLONOSCOPY     Patient location during evaluation: Endoscopy Anesthesia Type: MAC Level of consciousness: awake and alert Pain management: pain level controlled Vital Signs Assessment: post-procedure vital signs reviewed and stable Respiratory status: spontaneous breathing, nonlabored ventilation, respiratory function stable and patient connected to nasal cannula oxygen Cardiovascular status: stable and blood pressure returned to baseline Postop Assessment: no apparent nausea or vomiting Anesthetic complications: no   No notable events documented.  Last Vitals:  Vitals:   05/24/24 1050 05/24/24 1055  BP: (!) 165/51   Pulse: 76 73  Resp: (!) 27 17  Temp:    SpO2: 97% 98%    Last Pain:  Vitals:   05/24/24 1050  TempSrc:   PainSc: 0-No pain                 Green Quincy S

## 2024-05-24 NOTE — Discharge Instructions (Signed)

## 2024-05-24 NOTE — H&P (Addendum)
 [facility] History and Physical   Primary Care Physician:  Gable Cambric, MD   Reason for Procedure:  Iron deficiency anemia, history of gastric polyps, history of colon polyps  Plan:    Upper endoscopy and colonoscopy     HPI: Vanessa Johnson is a 88 y.o. female undergoing upper endoscopy and colonoscopy for investigation of iron deficiency anemia, history of gastric and colon polyps.  Patient has had recurrent issues with iron deficiency anemia.  2 months ago hemoglobin was 9.5.  She has received iron infusions and most recent hemoglobin 13.1.  Patient denies overt GI bleeding.  Previous EGDs have shown gastric polyps.  Most recent EGD in 06/2023 revealed esophageal candidiasis as well as an ulcer over a polyp in the prepyloric region.  Biopsies showed erosion, fibrosis and reactive changes without H. pylori or malignancy.  Last colonoscopy was performed in 2016 and showed 2 small tubular adenomas.   Past Medical History:  Diagnosis Date   Allergic rhinitis    Anemia 10/2022   Arthritis    Back pain    left lower back   Benign essential HTN    Biventricular cardiac pacemaker in situ 08/05/2020   BMI 34.0-34.9,adult    Bronchial asthma    Bronchitis 11/20/2018   CAD (coronary artery disease)    Heart cath 01/2017 [facility]   Cardiology follow-up encounter 01/26/2017   Overview:  Added automatically from request for surgery [id number]  Formatting of this note might be different from the original. Added automatically from request for surgery [id number]   CHF (congestive heart failure) (HCC)    Cholelithiasis with cholecystitis without obstruction 12/04/2015   Diabetes mellitus (HCC)    Diabetes mellitus due to underlying condition with complication, without long-term current use of insulin (HCC) 01/15/2016   Formatting of this note might be different from the original. Added automatically from request for surgery [id number]   Diabetes mellitus without complication (HCC)  01/15/2016   Dysfunction of right eustachian tube 11/20/2018   Essential hypertension 05/17/2017   Gastritis    Dr. Anette- EGD 2018   GERD (gastroesophageal reflux disease)    Hyperlipidemia    Hypothyroid    Iron deficiency anemia 09/02/2020   Ischiogluteal bursitis of left side    Left bundle branch block 04/02/2020   Mixed dyslipidemia 05/17/2017   Nonischemic cardiomyopathy (HCC) 04/02/2020   Obstructive sleep apnea of adult 12/04/2015   Pre-ulcerative corn or callous    Sleep apnea    On CPAP   Tinea pedis of right foot 01/15/2016   Unsteadiness 08/16/2018   Last Assessment & Plan:  Emergency department follow-up for evaluation of vertigo. Acute onset of vertigo 08/12/2018.  CT head at that time was okay.  Vertiginous symptoms resolved that day.  She persists with some unsteadiness but it is generally improving.  She has chronic history of unsteady that generally is helped by meclizine.  Denies any hearing loss symptoms or ringing in the ears. EXAM sh   Vitamin D deficiency     Past Surgical History:  Procedure Laterality Date   BIOPSY  07/04/2023   Procedure: BIOPSY;  Surgeon: Charlanne Groom, MD;  Location: [facility];  Service: Gastroenterology;;   BIV PACEMAKER INSERTION CRT-P N/A 04/24/2020   Procedure: BIV PACEMAKER INSERTION CRT-P;  Surgeon: Waddell Danelle ORN, MD;  Location: [facility] INVASIVE CV LAB;  Service: Cardiovascular;  Laterality: N/A;   BREAST BIOPSY  1970, 1980, and 2000   CATARACT EXTRACTION, BILATERAL  2016   CHOLECYSTECTOMY  2017  DILATION AND CURETTAGE OF UTERUS  1996   ESOPHAGOGASTRODUODENOSCOPY (EGD) WITH PROPOFOL  N/A 07/04/2023   Procedure: ESOPHAGOGASTRODUODENOSCOPY (EGD) WITH PROPOFOL ;  Surgeon: Charlanne Groom, MD;  Location: [facility];  Service: Gastroenterology;  Laterality: N/A;  Also check CBC, CMP, PT/INR same day, prior to EGD.   HEMORRHOIDECTOMY WITH HEMORRHOID BANDING  1970   LEFT HEART CATH AND CORONARY ANGIOGRAPHY  01/2017   [facility]- No intervention   TEE WITHOUT CARDIOVERSION N/A 02/21/2020   Procedure: TRANSESOPHAGEAL ECHOCARDIOGRAM (TEE);  Surgeon: Maranda Leim DEL, MD;  Location: [facility] ENDOSCOPY;  Service: Cardiovascular;  Laterality: N/A;   TONSILLECTOMY  1965   TOTAL HIP ARTHROPLASTY Bilateral    left- 2005 and right- 2010    Prior to Admission medications   Medication Sig Start Date End Date Taking? Authorizing Provider  albuterol  (PROVENTIL ) (2.5 MG/3ML) 0.083% nebulizer solution Take 2.5 mg by nebulization every 6 (six) hours as needed for wheezing or shortness of breath.  09/29/17   [provider]  aspirin  EC 81 MG tablet Take 1 tablet (81 mg total) by mouth daily. 05/17/17   Revankar, Jennifer SAUNDERS, MD  b complex vitamins tablet Take 1 tablet by mouth daily after lunch.    [provider]  cetirizine (ZYRTEC) 10 MG tablet Take 10 mg by mouth in the morning.    [provider]  Cholecalciferol (VITAMIN D-3 PO) Take 1 tablet by mouth daily after lunch.    [provider]  cyclobenzaprine (FLEXERIL) 10 MG tablet Take 10 mg by mouth at bedtime as needed for muscle spasms.     [provider]  famotidine (PEPCID) 40 MG tablet Take 40 mg by mouth daily as needed for heartburn. 08/09/22   [provider]  fluticasone (FLONASE) 50 MCG/ACT nasal spray Place 2 sprays into both nostrils as needed for allergies or rhinitis.    [provider]  furosemide  (LASIX ) 40 MG tablet TAKE 2 TABLETS TWICE A DAY (CHANGE IN DOSAGE OR PILL SIZE) 12/08/23   Rolan Ezra RAMAN, MD  JARDIANCE  10 MG TABS tablet TAKE 1 TABLET DAILY BEFORE BREAKFAST 08/17/23   Milford, Harlene HERO, FNP  levalbuterol (XOPENEX HFA) 45 MCG/ACT inhaler Inhale 2 puffs into the lungs every 4 (four) hours as needed for wheezing.    [provider]  meclizine (ANTIVERT) 25 MG tablet Take 25 mg by mouth every 8 (eight) hours as needed for dizziness or nausea.     [provider]  metFORMIN   (GLUCOPHAGE ) 500 MG tablet Take 250 mg by mouth 2 (two) times daily.    [provider]  metoprolol  succinate (TOPROL -XL) 100 MG 24 hr tablet Take 1 tablet (100 mg total) by mouth in the morning and at bedtime. Take with or immediately following a meal. 03/08/24   Rolan Ezra RAMAN, MD  Misc Natural Products (FOCUSED MIND PO) Take 1 tablet by mouth daily.    [provider]  montelukast (SINGULAIR) 10 MG tablet Take 10 mg by mouth at bedtime.  12/15/16   [provider]  Na Sulfate-K Sulfate-Mg Sulfate concentrate (SUPREP BOWEL PREP KIT) 17.5-3.13-1.6 GM/177ML SOLN Take 1 kit (354 mLs total) by mouth as directed. 04/06/24   Craig Alan SAUNDERS, PA-C  nitroGLYCERIN  (NITROSTAT ) 0.4 MG SL tablet Place 1 tablet (0.4 mg total) under the tongue every 5 (five) minutes as needed for chest pain. 10/23/21   Rolan Ezra RAMAN, MD  Omega-3 Fatty Acids (FISH OIL) 1200 MG CAPS Take 1,200 mg by mouth in the  morning and at bedtime.    [provider]  omeprazole  (PRILOSEC  OTC) 20 MG tablet Take 1 tablet (20 mg total) by mouth daily. 07/04/23 07/03/24  Charlanne Groom, MD  Polyethyl Glycol-Propyl Glycol (SYSTANE OP) Place 1 drop into both eyes 4 (four) times daily.    [provider]  polyethylene glycol (MIRALAX / GLYCOLAX) packet Take 17 g by mouth in the morning.    [provider]  rosuvastatin  (CRESTOR ) 20 MG tablet TAKE 1 TABLET DAILY ( CHANGE IN DOSAGE ) 05/21/24   Milford, Harlene HERO, FNP  spironolactone  (ALDACTONE ) 25 MG tablet Take 2 tablets (50 mg total) by mouth at bedtime. 11/14/23   Rolan Ezra RAMAN, MD  TIROSINT 75 MCG CAPS Take 75 mcg by mouth daily before breakfast. 05/15/21   [provider]  triamcinolone  cream (KENALOG ) 0.1 % Apply 1 Application topically daily as needed (skin irritation/eczema). 11/18/22   [provider]  vitamin E 400 UNIT capsule Take 400 Units by mouth daily after lunch.    [provider]  Wheat Dextrin  (BENEFIBER) POWD Take 1 Scoop by mouth daily as needed for constipation (constipation/regularity).    [provider]    Current Facility-Administered Medications  Medication Dose Route Frequency Provider Last Rate Last Admin   0.9 %  sodium chloride  infusion    Continuous PRN Suzann Inocente HERO, MD 20 mL/hr at 05/24/24 0757 500 mL at 05/24/24 0757    Allergies as of 04/05/2024 - Review Complete 03/09/2024  Allergen Reaction Noted   Ace inhibitors Anaphylaxis 01/19/2022   Angiotensin receptor blockers Anaphylaxis 01/19/2022   Diovan  [valsartan ] Swelling 04/01/2022   Entresto  [sacubitril-valsartan ] Anaphylaxis 01/19/2022   Tussionex pennkinetic er [hydrocod poli-chlorphe poli er] Swelling and Rash 07/03/2018   Cephalexin Swelling 05/31/2019   Codeine Other (See Comments) 12/04/2015   Gabapentin Other (See Comments) 04/03/2024   Sulfamethoxazole Other (See Comments) 12/04/2015   Avelox [moxifloxacin] Rash 12/04/2015   Cefuroxime axetil Rash 12/04/2015   Cephalosporins Rash 12/04/2015   Iohexol Rash 03/24/2010    Family History  Problem Relation Age of Onset   Diabetes Mother    Heart disease Mother    Hypertension Mother    Heart attack Mother    Emphysema Father    Hypertension Father    Heart attack Father    Breast cancer Sister    Diabetes Sister    Emphysema Sister    COPD Sister    Asthma Sister    Hypertension Sister    Lung cancer Brother    Diabetes Brother    Emphysema Brother    COPD Brother    Asthma Brother    Heart disease Brother    Liver disease Neg Hx    Colon cancer Neg Hx    Colon polyps Neg Hx    Esophageal cancer Neg Hx    Rectal cancer Neg Hx    Stomach cancer Neg Hx     Social History   Socioeconomic History   Marital status: Widowed    Spouse name: Not on file   Number of children: 2   Years of education: Not on file   Highest education level: Not on file  Occupational History   Occupation: retired  Tobacco Use   Smoking  status: Former   Smokeless tobacco: Never  Advertising account planner   Vaping status: Never Used  Substance and Sexual Activity   Alcohol use: No   Drug use: No   Sexual activity: Not on file  Other Topics  Concern   Not on file  Social History Narrative   Not on file   Social Drivers of Health   Financial Resource Strain: Not on file  Food Insecurity: Not on file  Transportation Needs: Not on file  Physical Activity: Not on file  Stress: Not on file  Social Connections: Not on file  Intimate Partner Violence: Not on file    Review of Systems:  All other review of systems negative except as mentioned in the HPI.  Physical Exam: Vital signs Pulse 86   Temp 97.8 F (36.6 C) (Temporal)   Resp (!) 22   Ht 5' 1 (1.549 m)   Wt 65.8 kg   SpO2 99%   BMI 27.40 kg/m   General:   Alert,  Well-developed, well-nourished, pleasant and cooperative in NAD Airway:  Mallampati 2 Lungs:  Clear throughout to auscultation.   Heart:  Regular rate and rhythm; no murmurs, clicks, rubs,  or gallops. Abdomen:  Soft, nontender and nondistended. Normal bowel sounds.   Neuro/Psych:  Normal mood and affect. A and O x 3  Inocente Hausen, MD [facility]

## 2024-05-24 NOTE — Transfer of Care (Signed)
 Immediate Anesthesia Transfer of Care Note  Patient: Vanessa Johnson  Procedure(s) Performed: EGD (ESOPHAGOGASTRODUODENOSCOPY) COLONOSCOPY  Patient Location: PACU  Anesthesia Type:MAC  Level of Consciousness: awake  Airway & Oxygen Therapy: Patient Spontanous Breathing  Post-op Assessment: Report given to RN and Post -op Vital signs reviewed and stable  Post vital signs: Reviewed and stable  Last Vitals:  Vitals Value Taken Time  BP    Temp    Pulse    Resp    SpO2      Last Pain:  Vitals:   05/24/24 0740  TempSrc: Temporal  PainSc: 0-No pain         Complications: No notable events documented.

## 2024-05-25 LAB — SURGICAL PATHOLOGY

## 2024-05-27 ENCOUNTER — Encounter (HOSPITAL_COMMUNITY): Payer: Self-pay | Admitting: Pediatrics

## 2024-05-29 ENCOUNTER — Ambulatory Visit: Payer: Self-pay | Admitting: Pediatrics

## 2024-06-05 ENCOUNTER — Encounter: Payer: Self-pay | Admitting: Cardiology

## 2024-06-05 ENCOUNTER — Ambulatory Visit: Attending: Cardiology | Admitting: Cardiology

## 2024-06-05 VITALS — BP 138/68 | HR 84 | Ht 61.0 in | Wt 146.2 lb

## 2024-06-05 DIAGNOSIS — E782 Mixed hyperlipidemia: Secondary | ICD-10-CM | POA: Insufficient documentation

## 2024-06-05 DIAGNOSIS — G4730 Sleep apnea, unspecified: Secondary | ICD-10-CM | POA: Insufficient documentation

## 2024-06-05 DIAGNOSIS — I10 Essential (primary) hypertension: Secondary | ICD-10-CM | POA: Diagnosis not present

## 2024-06-05 DIAGNOSIS — I2510 Atherosclerotic heart disease of native coronary artery without angina pectoris: Secondary | ICD-10-CM | POA: Insufficient documentation

## 2024-06-05 DIAGNOSIS — I428 Other cardiomyopathies: Secondary | ICD-10-CM | POA: Insufficient documentation

## 2024-06-05 NOTE — Patient Instructions (Signed)
 Medication Instructions:  Your physician recommends that you continue on your current medications as directed. Please refer to the Current Medication list given to you today.  *If you need a refill on your cardiac medications before your next appointment, please call your pharmacy*  Lab Work: None If you have labs (blood work) drawn today and your tests are completely normal, you will receive your results only by: MyChart Message (if you have MyChart) OR A paper copy in the mail If you have any lab test that is abnormal or we need to change your treatment, we will call you to review the results.  Testing/Procedures: None  Follow-Up: At [facility], you and your health needs are our priority.  As part of our continuing mission to provide you with exceptional heart care, our providers are all part of one team.  This team includes your primary Cardiologist (physician) and Advanced Practice Providers or APPs (Physician Assistants and Nurse Practitioners) who all work together to provide you with the care you need, when you need it.  Your next appointment:   9 month(s)  Provider:   Jennifer Crape, MD    We recommend signing up for the patient portal called MyChart.  Sign up information is provided on this After Visit Summary.  MyChart is used to connect with patients for Virtual Visits (Telemedicine).  Patients are able to view lab/test results, encounter notes, upcoming appointments, etc.  Non-urgent messages can be sent to your provider as well.   To learn more about what you can do with MyChart, go to [email or web address].   Other Instructions

## 2024-06-05 NOTE — Progress Notes (Signed)
 Cardiology Office Note:    Date:  06/05/2024   ID:  Vanessa Johnson, DOB 06/21/1936, MRN [id number]  PCP:  Gable Cambric, MD  Cardiologist:  Jennifer JONELLE Crape, MD   Referring MD: Gable Cambric, MD    ASSESSMENT:    1. Coronary artery disease involving native coronary artery of native heart without angina pectoris   2. Essential hypertension   3. Nonischemic cardiomyopathy (HCC)   4. Sleep apnea, unspecified type   5. Mixed dyslipidemia    PLAN:    In order of problems listed above:  Coronary artery disease: Secondary prevention stressed with the patient.  Importance of compliance with diet medication stressed and patient verbalized standing.  Her hip pain bothers her with ambulation so advised to be to be careful with ambulation. Essential hypertension: Blood pressure is stable and diet was emphasized. Mixed dyslipidemia: On lipid-lowering medications followed by primary care.  Lipids were reviewed. Diabetes mellitus: Diet was emphasized.  Weight reduction stressed and she promises to do better. Patient will be seen in follow-up appointment in 6 months or earlier if the patient has any concerns.    Medication Adjustments/Labs and Tests Ordered: Current medicines are reviewed at length with the patient today.  Concerns regarding medicines are outlined above.  No orders of the defined types were placed in this encounter.  No orders of the defined types were placed in this encounter.    No chief complaint on file.    History of Present Illness:    Vanessa Johnson is a 88 y.o. female.  Patient has past medical history of coronary artery disease, essential hypertension, mixed dyslipidemia and pacemaker.  She denies any problems at this time and takes care of activities of daily living.  No chest pain orthopnea or PND.  At the time of my evaluation, the patient is alert awake oriented and in no distress.  She has significant gastritis and a gastroenterologist has told her to take  aspirin  every other day.  She has an ejection fraction which is mildly depressed and stable.  At the time of my evaluation, the patient is alert awake oriented and in no distress.  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 Added automatically from request for surgery [id number]   CHF (congestive heart failure) (HCC)    Cholelithiasis with cholecystitis without obstruction 12/04/2015   Diabetes mellitus (HCC)    Diabetes mellitus due to underlying condition with complication, without long-term current use of insulin (HCC) 01/15/2016   Formatting of this note might be different from the original. Added automatically from request for surgery [id number]   Diabetes mellitus without complication (HCC) 01/15/2016   Dysfunction of right eustachian tube 11/20/2018   Essential hypertension 05/17/2017   Gastritis    Dr. Anette- EGD 2018   GERD (gastroesophageal reflux disease)    Hyperlipidemia    Hypothyroid    Iron deficiency anemia 09/02/2020   Ischiogluteal bursitis of left side  Left bundle branch block 04/02/2020   Mixed dyslipidemia 05/17/2017   Nonischemic cardiomyopathy (HCC) 04/02/2020   Obstructive sleep apnea of adult 12/04/2015   Polyp of colon 05/24/2024   Pre-ulcerative corn or callous    Sleep apnea    On CPAP   Tinea pedis of right foot 01/15/2016   Unsteadiness 08/16/2018   Last Assessment & Plan:  Emergency department follow-up for evaluation of vertigo. Acute onset  of vertigo 08/12/2018.  CT head at that time was okay.  Vertiginous symptoms resolved that day.  She persists with some unsteadiness but it is generally improving.  She has chronic history of unsteady that generally is helped by meclizine.  Denies any hearing loss symptoms or ringing in the ears. EXAM sh   Vitamin D deficiency     Past Surgical History:  Procedure Laterality Date   BIOPSY  07/04/2023   Procedure: BIOPSY;  Surgeon: Charlanne Groom, MD;  Location: [facility];  Service: Gastroenterology;;   BIV PACEMAKER INSERTION CRT-P N/A 04/24/2020   Procedure: BIV PACEMAKER INSERTION CRT-P;  Surgeon: Waddell Danelle ORN, MD;  Location: [facility] INVASIVE CV LAB;  Service: Cardiovascular;  Laterality: N/A;   BREAST BIOPSY  1970, 1980, and 2000   CATARACT EXTRACTION, BILATERAL  2016   CHOLECYSTECTOMY  2017   COLONOSCOPY N/A 05/24/2024   Procedure: COLONOSCOPY;  Surgeon: Suzann Inocente HERO, MD;  Location: [facility];  Service: Gastroenterology;  Laterality: N/A;   DILATION AND CURETTAGE OF UTERUS  1996   ESOPHAGOGASTRODUODENOSCOPY N/A 05/24/2024   Procedure: EGD (ESOPHAGOGASTRODUODENOSCOPY);  Surgeon: Suzann Inocente HERO, MD;  Location: [facility];  Service: Gastroenterology;  Laterality: N/A;   ESOPHAGOGASTRODUODENOSCOPY (EGD) WITH PROPOFOL  N/A 07/04/2023   Procedure: ESOPHAGOGASTRODUODENOSCOPY (EGD) WITH PROPOFOL ;  Surgeon: Charlanne Groom, MD;  Location: [facility];  Service: Gastroenterology;  Laterality: N/A;  Also check CBC, CMP, PT/INR same day, prior to EGD.   HEMORRHOIDECTOMY WITH HEMORRHOID BANDING  1970   LEFT HEART CATH AND CORONARY ANGIOGRAPHY  01/2017   [facility]- No intervention   TEE WITHOUT CARDIOVERSION N/A 02/21/2020   Procedure: TRANSESOPHAGEAL ECHOCARDIOGRAM (TEE);  Surgeon: Maranda Leim DEL, MD;  Location: [facility] ENDOSCOPY;  Service: Cardiovascular;  Laterality: N/A;   TONSILLECTOMY  1965   TOTAL HIP ARTHROPLASTY Bilateral    left- 2005 and right- 2010    Current  Medications: Current Meds  Medication Sig   albuterol  (PROVENTIL ) (2.5 MG/3ML) 0.083% nebulizer solution Take 2.5 mg by nebulization every 6 (six) hours as needed for wheezing or shortness of breath.    aspirin  EC 81 MG tablet Take 1 tablet (81 mg total) by mouth daily. (Patient taking differently: Take 81 mg by mouth every other day.)   b complex vitamins tablet Take 1 tablet by mouth daily after lunch.   cetirizine (ZYRTEC) 10 MG tablet Take 10 mg by mouth in the morning.   Cholecalciferol (VITAMIN D-3 PO) Take 1 tablet by mouth daily after lunch.   cyclobenzaprine (FLEXERIL) 10 MG tablet Take 10 mg by mouth at bedtime as needed for muscle spasms.    famotidine (PEPCID) 40 MG tablet Take 40 mg by mouth daily as needed for heartburn.   fluticasone (FLONASE) 50 MCG/ACT nasal spray Place 2 sprays into both nostrils as needed for allergies or rhinitis.   furosemide  (LASIX ) 40 MG tablet TAKE 2 TABLETS TWICE A DAY (CHANGE IN DOSAGE OR PILL SIZE)   JARDIANCE  10 MG TABS tablet TAKE 1 TABLET DAILY BEFORE BREAKFAST   levalbuterol (XOPENEX HFA) 45 MCG/ACT inhaler  Inhale 2 puffs into the lungs every 4 (four) hours as needed for wheezing.   meclizine (ANTIVERT) 25 MG tablet Take 25 mg by mouth every 8 (eight) hours as needed for dizziness or nausea.    metFORMIN  (GLUCOPHAGE ) 500 MG tablet Take 250 mg by mouth 2 (two) times daily.   metoprolol  succinate (TOPROL -XL) 100 MG 24 hr tablet Take 1 tablet (100 mg total) by mouth in the morning and at bedtime. Take with or immediately following a meal.   Misc Natural Products (FOCUSED MIND PO) Take 1 tablet by mouth daily.   montelukast (SINGULAIR) 10 MG tablet Take 10 mg by mouth at bedtime.    nitroGLYCERIN  (NITROSTAT ) 0.4 MG SL tablet Place 1 tablet (0.4 mg total) under the tongue every 5 (five) minutes as needed for chest pain.   Omega-3 Fatty Acids (FISH OIL) 1200 MG CAPS Take 1,200 mg by mouth in the morning and at bedtime.   Polyethyl Glycol-Propyl Glycol  (SYSTANE OP) Place 1 drop into both eyes 4 (four) times daily.   polyethylene glycol (MIRALAX / GLYCOLAX) packet Take 17 g by mouth in the morning.   rosuvastatin  (CRESTOR ) 20 MG tablet TAKE 1 TABLET DAILY ( CHANGE IN DOSAGE )   spironolactone  (ALDACTONE ) 25 MG tablet Take 2 tablets (50 mg total) by mouth at bedtime.   TIROSINT 75 MCG CAPS Take 75 mcg by mouth daily before breakfast.   triamcinolone  cream (KENALOG ) 0.1 % Apply 1 Application topically daily as needed (skin irritation/eczema).   vitamin E 400 UNIT capsule Take 400 Units by mouth daily after lunch.   Wheat Dextrin (BENEFIBER) POWD Take 1 Scoop by mouth daily as needed for constipation (constipation/regularity).     Allergies:   Ace inhibitors, Angiotensin receptor blockers, Diovan  [valsartan ], Entresto  [sacubitril-valsartan ], Tussionex pennkinetic er [hydrocod poli-chlorphe poli er], Cephalexin, Codeine, Gabapentin, Sulfamethoxazole, Avelox [moxifloxacin], Cefuroxime axetil, Cephalosporins, and Iohexol   Social History   Socioeconomic History   Marital status: Widowed    Spouse name: Not on file   Number of children: 2   Years of education: Not on file   Highest education level: Not on file  Occupational History   Occupation: retired  Tobacco Use   Smoking status: Former   Smokeless tobacco: Never  Advertising account planner   Vaping status: Never Used  Substance and Sexual Activity   Alcohol use: No   Drug use: No   Sexual activity: Not on file  Other Topics Concern   Not on file  Social History Narrative   Not on file   Social Drivers of Health   Financial Resource Strain: Not on file  Food Insecurity: Not on file  Transportation Needs: Not on file  Physical Activity: Not on file  Stress: Not on file  Social Connections: Not on file     Family History: The patient's family history includes Asthma in her brother and sister; Breast cancer in her sister; COPD in her brother and sister; Diabetes in her brother, mother, and  sister; Emphysema in her brother, father, and sister; Heart attack in her father and mother; Heart disease in her brother and mother; Hypertension in her father, mother, and sister; Lung cancer in her brother. There is no history of Liver disease, Colon cancer, Colon polyps, Esophageal cancer, Rectal cancer, or Stomach cancer.  ROS:   Please see the history of present illness.    All other systems reviewed and are negative.  EKGs/Labs/Other Studies Reviewed:    The following studies were reviewed today: SABRASABRA  I discussed my findings with the patient at length   Recent Labs: 07/14/2023: B Natriuretic Peptide 83.2 11/25/2023: NT-Pro BNP 889 05/11/2024: ALT 24; BUN 24; Creatinine 1.20; Hemoglobin 13.1; Platelet Count 370; Potassium 3.9; Sodium 137  Recent Lipid Panel    Component Value Date/Time   CHOL 138 11/25/2023 0953   TRIG 172 (H) 11/25/2023 0953   HDL 43 11/25/2023 0953   CHOLHDL 3.2 11/25/2023 0953   CHOLHDL 3.5 11/11/2022 1226   VLDL 59 (H) 11/11/2022 1226   LDLCALC 66 11/25/2023 0953    Physical Exam:    VS:  BP (!) 156/74   Pulse 84   Ht 5' 1 (1.549 m)   Wt 146 lb 3.2 oz (66.3 kg)   SpO2 95%   BMI 27.62 kg/m     Wt Readings from Last 3 Encounters:  06/05/24 146 lb 3.2 oz (66.3 kg)  05/24/24 145 lb (65.8 kg)  05/11/24 147 lb 12.8 oz (67 kg)     GEN: Patient is in no acute distress HEENT: Normal NECK: No JVD; No carotid bruits LYMPHATICS: No lymphadenopathy CARDIAC: Hear sounds regular, 2/6 systolic murmur at the apex. RESPIRATORY:  Clear to auscultation without rales, wheezing or rhonchi  ABDOMEN: Soft, non-tender, non-distended MUSCULOSKELETAL:  No edema; No deformity  SKIN: Warm and dry NEUROLOGIC:  Alert and oriented x 3 PSYCHIATRIC:  Normal affect   Signed, Jennifer JONELLE Crape, MD  06/05/2024 4:22 PM    [facility]

## 2024-06-11 ENCOUNTER — Ambulatory Visit: Attending: Internal Medicine

## 2024-06-11 DIAGNOSIS — Z950 Presence of cardiac pacemaker: Secondary | ICD-10-CM | POA: Diagnosis not present

## 2024-06-11 DIAGNOSIS — I5022 Chronic systolic (congestive) heart failure: Secondary | ICD-10-CM | POA: Diagnosis not present

## 2024-06-13 NOTE — Progress Notes (Signed)
 EPIC Encounter for ICM Monitoring  Patient Name: Vanessa Johnson is a 88 y.o. female Date: 06/13/2024 Primary Care Physican: Gable Cambric, MD Primary Cardiologist: Revankar/McLean Electrophysiologist: Waddell Pore Pacing: >99%          09/28/2023 Weight: 151-154 lbs  11/02/2023 Weight: 156 lbs 12/07/2023 Weight: 155 lbs       01/11/2024 Weight: 154.2 lbs (153-157 lbs)  02/14/2024 Weight: 151-154 lbs         06/05/2024 Weight: 146 lbs                                       Transmission results reviewed.      Diet:  [employer] foods frequently.  Not strict on foods that already contain salt.    CorVue thoracic impedance suggesting normal fluid levels within last month.   Prescribed:  Furosemide  40 mg take 2 tablet(s) (80 mg total) by mouth twice a day.  Spironolactone  25 mg take 2 tablets (50 mg total) by mouth at bedtime.   Labs: 05/11/2024 Creatinine 1.20, BUN 24, Potassium 3.9, Sodium 137, GFR 44  04/06/2024 Creatinine 1.43, BUN 20, Potassium 3.5, Sodium 136 04/03/2024 Creatinine 1.40, BUN 26, Potassium 3.7, Sodium 134 11/25/2023 Creatinine 1.34, BUN 21, Potassium 4.6, Sodium 139, GFR 39  11/11/2023 Creatinine 1.17, BUN 26, Potassium 3.4, Sodium 140  A complete set of results can be found in Results Review.   Recommendations:  No changes.   Follow-up plan: ICM clinic phone appointment on 07/16/2024.   91 day device clinic remote transmission 07/23/2024.     EP/Cardiology Office Visits:  Recall 10/03/2024 with HF clinic.  Recall 04/15/2024 with Dr Waddell.   05/24/2024 with Dr Edwyna.   Copy of ICM check sent to Dr. Waddell.    3 month ICM trend: 06/08/2024.    [name], RN 06/13/2024 1:03 PM

## 2024-07-13 DIAGNOSIS — M79671 Pain in right foot: Secondary | ICD-10-CM | POA: Diagnosis not present

## 2024-07-13 NOTE — Progress Notes (Signed)
 ICM Remote Transmission rescheduled for 08/06/2024.

## 2024-07-16 ENCOUNTER — Ambulatory Visit: Attending: Internal Medicine

## 2024-07-16 ENCOUNTER — Encounter

## 2024-07-16 DIAGNOSIS — I5022 Chronic systolic (congestive) heart failure: Secondary | ICD-10-CM | POA: Diagnosis not present

## 2024-07-16 DIAGNOSIS — L570 Actinic keratosis: Secondary | ICD-10-CM | POA: Diagnosis not present

## 2024-07-16 DIAGNOSIS — Z950 Presence of cardiac pacemaker: Secondary | ICD-10-CM

## 2024-07-16 DIAGNOSIS — L578 Other skin changes due to chronic exposure to nonionizing radiation: Secondary | ICD-10-CM | POA: Diagnosis not present

## 2024-07-16 DIAGNOSIS — L821 Other seborrheic keratosis: Secondary | ICD-10-CM | POA: Diagnosis not present

## 2024-07-17 NOTE — Progress Notes (Signed)
 EPIC Encounter for ICM Monitoring  Patient Name: Vanessa Johnson is a 88 y.o. female Date: 07/17/2024 Primary Care Physican: Gable Cambric, MD Primary Cardiologist: Revankar/McLean Electrophysiologist: Waddell Pore Pacing: >99%          09/28/2023 Weight: 151-154 lbs  11/02/2023 Weight: 156 lbs 12/07/2023 Weight: 155 lbs       01/11/2024 Weight: 154.2 lbs (153-157 lbs)  02/14/2024 Weight: 151-154 lbs         06/05/2024 Weight: 146 lbs      07/17/2024 Weight: 144.8 lbs                                  Spoke with patient and heart failure questions reviewed.  Transmission results reviewed.  Pt asymptomatic for fluid accumulation.  Reports feeling well at this time and voices no complaints.     Diet:  [employer] foods frequently.  Not strict on foods that already contain salt.  Some days she drinks a lot of fluid.    CorVue thoracic impedance suggesting normal fluid levels within last month with the exception of possible fluid accumulation 8/31-9/8.   Prescribed:  Furosemide  40 mg take 2 tablet(s) (80 mg total) by mouth twice a day.  Spironolactone  25 mg take 2 tablets (50 mg total) by mouth at bedtime.   Labs: 05/11/2024 Creatinine 1.20, BUN 24, Potassium 3.9, Sodium 137, GFR 44  04/06/2024 Creatinine 1.43, BUN 20, Potassium 3.5, Sodium 136 04/03/2024 Creatinine 1.40, BUN 26, Potassium 3.7, Sodium 134 11/25/2023 Creatinine 1.34, BUN 21, Potassium 4.6, Sodium 139, GFR 39  11/11/2023 Creatinine 1.17, BUN 26, Potassium 3.4, Sodium 140  A complete set of results can be found in Results Review.   Recommendations:  No changes and encouraged to call if experiencing any fluid symptoms.   Follow-up plan: ICM clinic phone appointment on 08/20/2024.   91 day device clinic remote transmission 07/23/2024.     EP/Cardiology Office Visits:  Recall 10/03/2024 with HF clinic.  Recall 04/15/2024 with Dr Waddell.  Recall 03/02/2025 with Dr Edwyna.   Copy of ICM check sent to Dr. Waddell.    3 month ICM  trend: 07/16/2024.    12-14 Month ICM trend:     Vanessa GORMAN Garner, RN 07/17/2024 11:42 AM

## 2024-07-19 DIAGNOSIS — E785 Hyperlipidemia, unspecified: Secondary | ICD-10-CM | POA: Diagnosis not present

## 2024-07-19 DIAGNOSIS — K219 Gastro-esophageal reflux disease without esophagitis: Secondary | ICD-10-CM | POA: Diagnosis not present

## 2024-07-19 DIAGNOSIS — Z6827 Body mass index (BMI) 27.0-27.9, adult: Secondary | ICD-10-CM | POA: Diagnosis not present

## 2024-07-19 DIAGNOSIS — Z79899 Other long term (current) drug therapy: Secondary | ICD-10-CM | POA: Diagnosis not present

## 2024-07-19 DIAGNOSIS — E559 Vitamin D deficiency, unspecified: Secondary | ICD-10-CM | POA: Diagnosis not present

## 2024-07-19 DIAGNOSIS — E039 Hypothyroidism, unspecified: Secondary | ICD-10-CM | POA: Diagnosis not present

## 2024-07-19 DIAGNOSIS — I10 Essential (primary) hypertension: Secondary | ICD-10-CM | POA: Diagnosis not present

## 2024-07-19 DIAGNOSIS — E1140 Type 2 diabetes mellitus with diabetic neuropathy, unspecified: Secondary | ICD-10-CM | POA: Diagnosis not present

## 2024-07-23 ENCOUNTER — Ambulatory Visit (INDEPENDENT_AMBULATORY_CARE_PROVIDER_SITE_OTHER): Payer: Medicare Other

## 2024-07-23 DIAGNOSIS — I428 Other cardiomyopathies: Secondary | ICD-10-CM | POA: Diagnosis not present

## 2024-07-24 LAB — CUP PACEART REMOTE DEVICE CHECK
Battery Remaining Longevity: 48 mo
Battery Remaining Percentage: 48 %
Battery Voltage: 2.98 V
Brady Statistic AP VP Percent: 1.4 %
Brady Statistic AP VS Percent: 1 %
Brady Statistic AS VP Percent: 98 %
Brady Statistic AS VS Percent: 1 %
Brady Statistic RA Percent Paced: 1 %
Date Time Interrogation Session: 20250929020008
Implantable Lead Connection Status: 753985
Implantable Lead Connection Status: 753985
Implantable Lead Connection Status: 753985
Implantable Lead Implant Date: 20210701
Implantable Lead Implant Date: 20210701
Implantable Lead Implant Date: 20210701
Implantable Lead Location: 753858
Implantable Lead Location: 753859
Implantable Lead Location: 753860
Implantable Pulse Generator Implant Date: 20210701
Lead Channel Impedance Value: 1075 Ohm
Lead Channel Impedance Value: 450 Ohm
Lead Channel Impedance Value: 560 Ohm
Lead Channel Pacing Threshold Amplitude: 0.75 V
Lead Channel Pacing Threshold Amplitude: 0.75 V
Lead Channel Pacing Threshold Amplitude: 0.75 V
Lead Channel Pacing Threshold Pulse Width: 0.5 ms
Lead Channel Pacing Threshold Pulse Width: 0.5 ms
Lead Channel Pacing Threshold Pulse Width: 0.5 ms
Lead Channel Sensing Intrinsic Amplitude: 12 mV
Lead Channel Sensing Intrinsic Amplitude: 5 mV
Lead Channel Setting Pacing Amplitude: 2 V
Lead Channel Setting Pacing Amplitude: 2 V
Lead Channel Setting Pacing Amplitude: 2 V
Lead Channel Setting Pacing Pulse Width: 0.5 ms
Lead Channel Setting Pacing Pulse Width: 0.5 ms
Lead Channel Setting Sensing Sensitivity: 2 mV
Pulse Gen Model: 3562
Pulse Gen Serial Number: 3818336

## 2024-07-25 ENCOUNTER — Ambulatory Visit: Payer: Self-pay | Admitting: Internal Medicine

## 2024-07-25 NOTE — Progress Notes (Signed)
 Remote PPM Transmission

## 2024-08-03 DIAGNOSIS — M19079 Primary osteoarthritis, unspecified ankle and foot: Secondary | ICD-10-CM | POA: Diagnosis not present

## 2024-08-03 DIAGNOSIS — E119 Type 2 diabetes mellitus without complications: Secondary | ICD-10-CM | POA: Diagnosis not present

## 2024-08-06 ENCOUNTER — Encounter

## 2024-08-20 ENCOUNTER — Ambulatory Visit: Attending: Internal Medicine

## 2024-08-20 DIAGNOSIS — Z950 Presence of cardiac pacemaker: Secondary | ICD-10-CM | POA: Diagnosis not present

## 2024-08-20 DIAGNOSIS — I5022 Chronic systolic (congestive) heart failure: Secondary | ICD-10-CM | POA: Diagnosis not present

## 2024-08-21 ENCOUNTER — Telehealth: Payer: Self-pay

## 2024-08-21 NOTE — Telephone Encounter (Signed)
 Remote ICM transmission received.  Attempted call to patient regarding ICM remote transmission.  Left detailed message per DPR with ICM phone number to return call for any questions, concerns or fluid symptoms.

## 2024-08-21 NOTE — Progress Notes (Cosign Needed Addendum)
 EPIC Encounter for ICM Monitoring  Patient Name: Vanessa Johnson is a 88 y.o. female Date: 08/21/2024 Primary Care Physican: Gable Cambric, MD Primary Cardiologist: Revankar/McLean Electrophysiologist: Waddell Pore Pacing: >99%   (07/16/2024 report)       09/28/2023 Weight: 151-154 lbs  11/02/2023 Weight: 156 lbs 12/07/2023 Weight: 155 lbs       01/11/2024 Weight: 154.2 lbs (153-157 lbs)  02/14/2024 Weight: 151-154 lbs         06/05/2024 Weight: 146 lbs      07/17/2024 Weight: 144.8 lbs                                  Attempted call to patient and unable to reach.  Left detailed message per DPR regarding transmission.  Transmission results reviewed. .     Diet:  Eats restaurant foods frequently.  Not strict on foods that already contain salt.  Some days she drinks a lot of fluid.    Since 07/16/2024 ICM Remote Transmission: CorVue thoracic impedance suggesting normal fluid levels with the exception of possible fluid accumulation 08/13/2024-08/16/2024.   Prescribed:  Furosemide  40 mg take 2 tablet(s) (80 mg total) by mouth twice a day.  Spironolactone  25 mg take 2 tablets (50 mg total) by mouth at bedtime.   Labs: 05/11/2024 Creatinine 1.20, BUN 24, Potassium 3.9, Sodium 137, GFR 44  04/06/2024 Creatinine 1.43, BUN 20, Potassium 3.5, Sodium 136 04/03/2024 Creatinine 1.40, BUN 26, Potassium 3.7, Sodium 134 11/25/2023 Creatinine 1.34, BUN 21, Potassium 4.6, Sodium 139, GFR 39  11/11/2023 Creatinine 1.17, BUN 26, Potassium 3.4, Sodium 140  A complete set of results can be found in Results Review.   Recommendations: Left voice mail with ICM number and encouraged to call if experiencing any fluid symptoms.   Follow-up plan: ICM clinic phone appointment on 09/24/2024.   91 day device clinic remote transmission 10/22/2024.     EP/Cardiology Office Visits:  09/25/2024 with Dr Rolan.  Recall 04/15/2024 with Dr Waddell.  Recall 03/02/2025 with Dr Edwyna.   Copy of ICM check sent to Dr. Waddell.     Remote monitoring is medically necessary for Heart Failure Management.    Daily Thoracic Impedance ICM trend: 08/17/2024.    [name], RN 08/21/2024 3:37 PM

## 2024-08-25 DIAGNOSIS — N898 Other specified noninflammatory disorders of vagina: Secondary | ICD-10-CM | POA: Diagnosis not present

## 2024-08-25 DIAGNOSIS — B3731 Acute candidiasis of vulva and vagina: Secondary | ICD-10-CM | POA: Diagnosis not present

## 2024-08-30 DIAGNOSIS — H35341 Macular cyst, hole, or pseudohole, right eye: Secondary | ICD-10-CM | POA: Diagnosis not present

## 2024-09-10 NOTE — Progress Notes (Unsigned)
 [facility]  [address] [place],  [place]  [address] [phone]  Clinic Day:  05/11/2024  Referring physician: Gable Cambric, MD   HISTORY OF PRESENT ILLNESS:  The patient is a 88 y.o. female with a history of iron deficiency anemia.  She comes in today to reassess her iron and hemoglobin levels after receiving another course of IV iron in July 2025.  The patient has felt much better since her last course of IV iron was given.  She denies having any overt forms of blood loss since her last visit.  She did have an upper endoscopy in September 2024, for which nothing ominous was seen.  The patient had a repeat EGD in July 2025, which revealed 3 angioectasias that were ablated.  Multiple small polyps and internal hemorrhoids were seen with her colonoscopy.  PHYSICAL EXAM:  There were no vitals taken for this visit. Wt Readings from Last 3 Encounters:  06/05/24 146 lb 3.2 oz (66.3 kg)  05/24/24 145 lb (65.8 kg)  05/11/24 147 lb 12.8 oz (67 kg)   There is no height or weight on file to calculate BMI. Performance status (ECOG): 1 - Symptomatic but completely ambulatory Physical Exam Constitutional:      Appearance: Normal appearance. She is not ill-appearing.  HENT:     Mouth/Throat:     Mouth: Mucous membranes are moist.     Pharynx: Oropharynx is clear. No oropharyngeal exudate or posterior oropharyngeal erythema.  Cardiovascular:     Rate and Rhythm: Normal rate and regular rhythm.     Heart sounds: No murmur heard.    No friction rub. No gallop.  Pulmonary:     Effort: Pulmonary effort is normal. No respiratory distress.     Breath sounds: Normal breath sounds. No wheezing, rhonchi or rales.  Abdominal:     General: Bowel sounds are normal. There is no distension.     Palpations: Abdomen is soft. There is no mass.     Tenderness: There is no abdominal tenderness.  Musculoskeletal:        General: No swelling.     Right lower leg: No edema.      Left lower leg: No edema.  Lymphadenopathy:     Cervical: No cervical adenopathy.     Upper Body:     Right upper body: No supraclavicular or axillary adenopathy.     Left upper body: No supraclavicular or axillary adenopathy.     Lower Body: No right inguinal adenopathy. No left inguinal adenopathy.  Skin:    General: Skin is warm.     Coloration: Skin is not jaundiced.     Findings: No lesion or rash.  Neurological:     General: No focal deficit present.     Mental Status: She is alert and oriented to person, place, and time. Mental status is at baseline.  Psychiatric:        Mood and Affect: Mood normal.        Behavior: Behavior normal.        Thought Content: Thought content normal.    LABS:      Latest Ref Rng & Units 05/11/2024    1:46 PM 04/03/2024    3:21 PM 03/09/2024    1:59 PM  CBC  WBC 4.0 - 10.5 K/uL 7.4  7.2  9.9   Hemoglobin 12.0 - 15.0 g/dL 86.8  87.7  9.5   Hematocrit 36.0 - 46.0 % 39.9  37.6  29.7  Platelets 150 - 400 K/uL 370  477.0 Repeated and verified X2.  564       Latest Ref Rng & Units 05/11/2024    1:46 PM 04/06/2024   12:30 PM 04/03/2024    3:21 PM  CMP  Glucose 70 - 99 mg/dL 887  881  848   BUN 8 - 23 mg/dL 24  20  26    Creatinine 0.44 - 1.00 mg/dL 8.79  8.56  8.59   Sodium 135 - 145 mmol/L 137  136  134   Potassium 3.5 - 5.1 mmol/L 3.9  3.5  3.7   Chloride 98 - 111 mmol/L 99  98  96   CO2 22 - 32 mmol/L 24  24  28    Calcium  8.9 - 10.3 mg/dL 9.8  9.4  89.8   Total Protein 6.5 - 8.1 g/dL 7.5   7.6   Total Bilirubin 0.0 - 1.2 mg/dL 0.5   0.6   Alkaline Phos 38 - 126 U/L 92   66   AST 15 - 41 U/L 29   26   ALT 0 - 44 U/L 24   26     Latest Reference Range & Units 05/11/24 13:49  Iron 28 - 170 ug/dL 50  UIBC ug/dL 563  TIBC 749 - 549 ug/dL 513 (H)  Saturation Ratios 10.4 - 31.8 % 10 (L)  Ferritin 11 - 307 ng/mL 15  (H): Data is abnormally high (L): Data is abnormally low  ASSESSMENT & PLAN:  Assessment/Plan:  An 88 y.o. female  with iron deficiency anemia.  I am very pleased with her hemoglobin today, which is the highest it has been in numerous months.  Her iron parameters still show degree of iron deficiency.  Based upon this, I will arrange for her to receive another course of IV iron over the next few weeks.  Although this is a repeated course of IV iron, her numbers are clearly trending in the right direction.  As mentioned previously, she is scheduled for a repeat GI workup later this month.  Clinically, the patient is doing much better.  I will see her back in 4 months for repeat clinical assessment.  The patient understands all the plans discussed today and is in agreement with them.    Irish Breisch DELENA Kerns, MD

## 2024-09-11 ENCOUNTER — Inpatient Hospital Stay

## 2024-09-11 ENCOUNTER — Inpatient Hospital Stay: Attending: Oncology | Admitting: Oncology

## 2024-09-11 ENCOUNTER — Other Ambulatory Visit (HOSPITAL_COMMUNITY): Payer: Self-pay | Admitting: Cardiology

## 2024-09-11 ENCOUNTER — Other Ambulatory Visit: Payer: Self-pay | Admitting: Oncology

## 2024-09-11 DIAGNOSIS — D509 Iron deficiency anemia, unspecified: Secondary | ICD-10-CM | POA: Diagnosis not present

## 2024-09-11 LAB — CBC WITH DIFFERENTIAL (CANCER CENTER ONLY)
Abs Immature Granulocytes: 0.01 K/uL (ref 0.00–0.07)
Basophils Absolute: 0.1 K/uL (ref 0.0–0.1)
Basophils Relative: 1 %
Eosinophils Absolute: 0.3 K/uL (ref 0.0–0.5)
Eosinophils Relative: 3 %
HCT: 47.6 % — ABNORMAL HIGH (ref 36.0–46.0)
Hemoglobin: 16.3 g/dL — ABNORMAL HIGH (ref 12.0–15.0)
Immature Granulocytes: 0 %
Lymphocytes Relative: 30 %
Lymphs Abs: 2.2 K/uL (ref 0.7–4.0)
MCH: 29 pg (ref 26.0–34.0)
MCHC: 34.2 g/dL (ref 30.0–36.0)
MCV: 84.7 fL (ref 80.0–100.0)
Monocytes Absolute: 0.7 K/uL (ref 0.1–1.0)
Monocytes Relative: 10 %
Neutro Abs: 4 K/uL (ref 1.7–7.7)
Neutrophils Relative %: 56 %
Platelet Count: 329 K/uL (ref 150–400)
RBC: 5.62 MIL/uL — ABNORMAL HIGH (ref 3.87–5.11)
RDW: 14.3 % (ref 11.5–15.5)
WBC Count: 7.3 K/uL (ref 4.0–10.5)
nRBC: 0 % (ref 0.0–0.2)

## 2024-09-11 LAB — IRON AND TIBC
Iron: 90 ug/dL (ref 28–170)
Saturation Ratios: 20 % (ref 10.4–31.8)
TIBC: 441 ug/dL (ref 250–450)
UIBC: 351 ug/dL

## 2024-09-11 LAB — FERRITIN: Ferritin: 141 ng/mL (ref 11–307)

## 2024-09-11 MED ORDER — METOPROLOL SUCCINATE ER 100 MG PO TB24: 100.0000 mg | ORAL_TABLET | Freq: Two times a day (BID) | ORAL | 3 refills | Status: AC

## 2024-09-12 ENCOUNTER — Telehealth: Payer: Self-pay

## 2024-09-12 ENCOUNTER — Telehealth: Payer: Self-pay | Admitting: Oncology

## 2024-09-12 NOTE — Telephone Encounter (Signed)
 Patient has been scheduled for follow-up visit per 09/10/24 LOS.  LVM notifying pt of appt details, provided my direct number to pt if appt changes need to be made.

## 2024-09-12 NOTE — Telephone Encounter (Signed)
 Latest Reference Range & Units 05/11/24 13:49 09/11/24 13:37  Iron 28 - 170 ug/dL 50 90  UIBC ug/dL 563 648  TIBC 749 - 549 ug/dL 513 (H) 558  Saturation Ratios 10.4 - 31.8 % 10 (L) 20  Ferritin 11 - 307 ng/mL 15 141  (H): Data is abnormally high (L): Data is abnormally low   ASSESSMENT & PLAN:  Assessment/Plan:  An 88 y.o. female with iron deficiency anemia.  I am very pleased with her hemoglobin today, which is the highest it has ever been.  Her iron parameters have also normalized.  Clinically, the patient is doing much better.  I will see her back in 6 months for repeat clinical assessment.  The patient understands all the plans discussed today and is in agreement with them.     Dequincy DELENA Kerns, MD

## 2024-09-17 ENCOUNTER — Ambulatory Visit

## 2024-09-24 ENCOUNTER — Telehealth: Payer: Self-pay

## 2024-09-24 ENCOUNTER — Ambulatory Visit: Attending: Internal Medicine

## 2024-09-24 DIAGNOSIS — I5022 Chronic systolic (congestive) heart failure: Secondary | ICD-10-CM | POA: Diagnosis not present

## 2024-09-24 DIAGNOSIS — Z950 Presence of cardiac pacemaker: Secondary | ICD-10-CM | POA: Diagnosis not present

## 2024-09-24 NOTE — Progress Notes (Signed)
 EPIC Encounter for ICM Monitoring  Patient Name: Vanessa Johnson is a 88 y.o. female Date: 09/24/2024 Primary Care Physican: Gable Cambric, MD Primary Cardiologist: Revankar/McLean Electrophysiologist: Waddell Pore Pacing: >99%   (07/16/2024 report)       09/28/2023 Weight: 151-154 lbs  11/02/2023 Weight: 156 lbs 12/07/2023 Weight: 155 lbs       01/11/2024 Weight: 154.2 lbs (153-157 lbs)  02/14/2024 Weight: 151-154 lbs         06/05/2024 Weight: 146 lbs      07/17/2024 Weight: 144.8 lbs                                  Attempted call to patient and unable to reach.  Transmission results reviewed. .     Diet:  Eats restaurant foods frequently.  Not strict on foods that already contain salt.  Some days she drinks a lot of fluid.    Since 08/20/2024 ICM Remote Transmission: CorVue thoracic impedance suggesting normal fluid levels.   Prescribed:  Furosemide  40 mg take 2 tablet(s) (80 mg total) by mouth twice a day.  Spironolactone  25 mg take 2 tablets (50 mg total) by mouth at bedtime.   Labs: 05/11/2024 Creatinine 1.20, BUN 24, Potassium 3.9, Sodium 137, GFR 44  04/06/2024 Creatinine 1.43, BUN 20, Potassium 3.5, Sodium 136 04/03/2024 Creatinine 1.40, BUN 26, Potassium 3.7, Sodium 134 11/25/2023 Creatinine 1.34, BUN 21, Potassium 4.6, Sodium 139, GFR 39  11/11/2023 Creatinine 1.17, BUN 26, Potassium 3.4, Sodium 140  A complete set of results can be found in Results Review.   Recommendations: Unable to reach.     Follow-up plan: ICM clinic phone appointment on 11/05/2024.   91 day device clinic remote transmission 10/22/2024.     EP/Cardiology Office Visits:  10/01/2024 with Dr Rolan.  Recall 04/15/2024 with Dr Waddell.  Recall 03/02/2025 with Dr Edwyna.   Copy of ICM check sent to Dr. Waddell.    Remote monitoring is medically necessary for Heart Failure Management.    Daily Thoracic Impedance ICM trend: 06/26/2024 through 09/24/2024.    12-14 Month Thoracic Impedance ICM trend:     Vanessa GORMAN Garner, RN 09/24/2024 4:58 PM

## 2024-09-24 NOTE — Telephone Encounter (Signed)
 Remote ICM transmission received.  Attempted call to patient regarding ICM remote transmission and no answer.

## 2024-09-25 ENCOUNTER — Encounter (HOSPITAL_COMMUNITY): Admitting: Cardiology

## 2024-09-25 DIAGNOSIS — H35341 Macular cyst, hole, or pseudohole, right eye: Secondary | ICD-10-CM | POA: Diagnosis not present

## 2024-09-28 ENCOUNTER — Telehealth (HOSPITAL_COMMUNITY): Payer: Self-pay

## 2024-09-28 NOTE — Progress Notes (Incomplete)
 PCP: Gable Cambric, MD  Cardiology: Jennifer JONELLE Crape, MD HF Cardiology: Dr. Rolan  88 y.o. with history of chronic systolic CHF (nonischemic cardiomyopathy), chronic LBBB, and mitral regurgitation was referred by Dr. Wonda for CHF evaluation prior to possible Mitraclip placement.   CHF was first noted in 12/20, she was admitted to the hospital in [facility] at that time.  LHC showed nonobstructive CAD.  She later followed up with Dr. Crape and had echo in 3/21, showing EF 35-40% with concern for severe MR.  TEE was then done in 4/21 showing EF 30-35% with septal-lateral dyssynchrony, moderate-severe functional MR, normal RV, severe TR.  She has had a LBBB on ECGs in 2021, she did not have LBBB in 2019.    Cardiac MRI in 6/21 showed moderate LV dilation, EF 27%, moderately decreased RV function with EF 31%, no LGE, probably moderate MR.   She had St Jude CRT-P device implanted in 7/21.  Echo in 9/21 showed EF up to 40% with normal RV, mild-moderate MR. Echo 3/23 showed EF 40-45%, mild LVH, mildly decreased RV systolic function, moderate MR.   Repeat echo 8/24 EF 45%, mild MR. Normal RV.   Had EGD done 9/24 for evaluation of IDA. EGD showed non bleeding, healing, gastric ulcer =>biopsied and benign, + several gastric polyps. Also found to have esophageal candidiasis and has been started on Diflucan .   Today she returns for HF follow up. Overall feeling fine. She had dizziness last month, this resolved when she stopped her gabapentin. She is SOB walking short distances on flat ground, she walks with a cane for balance. Denies palpitations, abnormal bleeding, CP,  edema, or PND/Orthopnea. Appetite ok. Weight at home 149 pounds. Taking all medications. Wears CPAP. Planning EGD next month for work up of anemia.  ReDs reading: 35%, normal  St Jude device interrogation (personally reviewed): thoracic impedence down, >99% BiV pacing.   ECG (personally reviewed): none ordered today.  Labs (1/24):  hgb 8.9, LDL 32 Labs (6/24): hgb 10.9, K 3.6, creatinine 1.12 Labs (9/24): hgb 13.6, Scr 1.32, K 3.3 Labs (1/25): K 3.4, creatinine 1.17, hgb 11.7, LDL 66  PMH: 1. LBBB: Chronic.  2. Anemia: Prior GI workup in [place] was negative.  No history of overt GI bleeding. Fe deficiency.  3. Type 2 diabetes 4. Hyperlipidemia 5. CAD: LHC (2020) with 40% ostial left main, 50% mid RCA.  6. Chronic systolic CHF: Noted since 12/20.  Nonischemic cardiomyopathy.  - TEE (4/21): EF 30-35%, septal-lateral dyssynchrony, moderate-severe MR appears functional, normal RV size and systolic function, severe biatrial enlargement, severe TR.  - Cardiac MRI (6/21): Moderate LV dilation, EF 27%; moderately decreased RV function with EF 31%, no LGE, probably moderate MR.  - St Jude CRT-P implanted 7/21.  - Echo (9/21): EF 40%, diffuse hypokinesis, mildly decreased RV systolic function, mild-moderate MR, normal IVC.  - Echo (3/23): EF 40-45%, mild LVH, mildly decreased RV systolic function, moderate MR. - Echo (8/24): EF 45%, normal RV, mild MR 7. GERD 8. Hyperlipidemia 9. Hypothyroidism 10. Mitral regurgitation: Suspected to be functional.  Echo in 9/21 with improved MR, appears mild-moderate.  11. Fe deficiency anemia 12. Fatty liver 13. CKD stage 3 14. Gastric ulcer  Social History   Socioeconomic History   Marital status: Widowed    Spouse name: Not on file   Number of children: 2   Years of education: Not on file   Highest education level: Not on file  Occupational History   Occupation: retired  Tobacco Use   Smoking status: Former   Smokeless tobacco: Never  Vaping Use   Vaping status: Never Used  Substance and Sexual Activity   Alcohol use: No   Drug use: No   Sexual activity: Not on file  Other Topics Concern   Not on file  Social History Narrative   Not on file   Social Drivers of Health   Financial Resource Strain: Not on file  Food Insecurity: Not on file  Transportation Needs:  Not on file  Physical Activity: Not on file  Stress: Not on file  Social Connections: Not on file  Intimate Partner Violence: Not on file   Family History  Problem Relation Age of Onset   Diabetes Mother    Heart disease Mother    Hypertension Mother    Heart attack Mother    Emphysema Father    Hypertension Father    Heart attack Father    Breast cancer Sister    Diabetes Sister    Emphysema Sister    COPD Sister    Asthma Sister    Hypertension Sister    Lung cancer Brother    Diabetes Brother    Emphysema Brother    COPD Brother    Asthma Brother    Heart disease Brother    Liver disease Neg Hx    Colon cancer Neg Hx    Colon polyps Neg Hx    Esophageal cancer Neg Hx    Rectal cancer Neg Hx    Stomach cancer Neg Hx    ROS: All systems reviewed and negative except as per HPI.   Current Outpatient Medications  Medication Sig Dispense Refill   albuterol  (PROVENTIL ) (2.5 MG/3ML) 0.083% nebulizer solution Take 2.5 mg by nebulization every 6 (six) hours as needed for wheezing or shortness of breath.      aspirin  EC 81 MG tablet Take 1 tablet (81 mg total) by mouth daily. (Patient taking differently: Take 81 mg by mouth every other day.) 30 tablet 11   b complex vitamins tablet Take 1 tablet by mouth daily after lunch.     cetirizine (ZYRTEC) 10 MG tablet Take 10 mg by mouth in the morning.     Cholecalciferol (VITAMIN D-3 PO) Take 1 tablet by mouth daily after lunch.     cyclobenzaprine (FLEXERIL) 10 MG tablet Take 10 mg by mouth at bedtime as needed for muscle spasms.      famotidine (PEPCID) 40 MG tablet Take 40 mg by mouth daily as needed for heartburn.     fluticasone (FLONASE) 50 MCG/ACT nasal spray Place 2 sprays into both nostrils as needed for allergies or rhinitis.     furosemide  (LASIX ) 40 MG tablet TAKE 2 TABLETS TWICE A DAY (CHANGE IN DOSAGE OR PILL SIZE) 180 tablet 7   JARDIANCE  10 MG TABS tablet TAKE 1 TABLET DAILY BEFORE BREAKFAST 90 tablet 3   levalbuterol  (XOPENEX HFA) 45 MCG/ACT inhaler Inhale 2 puffs into the lungs every 4 (four) hours as needed for wheezing.     meclizine (ANTIVERT) 25 MG tablet Take 25 mg by mouth every 8 (eight) hours as needed for dizziness or nausea.      metFORMIN  (GLUCOPHAGE ) 500 MG tablet Take 250 mg by mouth 2 (two) times daily.     metoprolol  succinate (TOPROL -XL) 100 MG 24 hr tablet Take 1 tablet (100 mg total) by mouth in the morning and at bedtime. Take with or immediately following a meal. 180 tablet 3  Misc Natural Products (FOCUSED MIND PO) Take 1 tablet by mouth daily.     montelukast (SINGULAIR) 10 MG tablet Take 10 mg by mouth at bedtime.      nitroGLYCERIN  (NITROSTAT ) 0.4 MG SL tablet Place 1 tablet (0.4 mg total) under the tongue every 5 (five) minutes as needed for chest pain. 30 tablet 2   Omega-3 Fatty Acids (FISH OIL) 1200 MG CAPS Take 1,200 mg by mouth in the morning and at bedtime.     Polyethyl Glycol-Propyl Glycol (SYSTANE OP) Place 1 drop into both eyes 4 (four) times daily.     polyethylene glycol (MIRALAX / GLYCOLAX) packet Take 17 g by mouth in the morning.     rosuvastatin  (CRESTOR ) 20 MG tablet TAKE 1 TABLET DAILY ( CHANGE IN DOSAGE ) 90 tablet 3   spironolactone  (ALDACTONE ) 25 MG tablet Take 2 tablets (50 mg total) by mouth at bedtime. 90 tablet 3   TIROSINT 75 MCG CAPS Take 75 mcg by mouth daily before breakfast.     triamcinolone  cream (KENALOG ) 0.1 % Apply 1 Application topically daily as needed (skin irritation/eczema).     vitamin E 400 UNIT capsule Take 400 Units by mouth daily after lunch.     Wheat Dextrin (BENEFIBER) POWD Take 1 Scoop by mouth daily as needed for constipation (constipation/regularity).     No current facility-administered medications for this visit.   Wt Readings from Last 3 Encounters:  09/11/24 66.3 kg (146 lb 1.6 oz)  06/05/24 66.3 kg (146 lb 3.2 oz)  05/24/24 65.8 kg (145 lb)   There were no vitals taken for this visit.   PHYSICAL EXAM: General:  NAD. No  resp difficulty, walked into clinic with cane, elderly HEENT: Normal Neck: Supple. No JVD. Cor: Regular rate & rhythm. No rubs, gallops or murmurs. Lungs: Clear Abdomen: Soft, nontender, nondistended.  Extremities: No cyanosis, clubbing, rash, edema Neuro: Alert & oriented x 3, moves all 4 extremities w/o difficulty. Affect pleasant.  ASSESSMENT/PLAN: 1. Chronic systolic CHF: Patient has a nonischemic cardiomyopathy of uncertain etiology.  She has significant mitral regurgitation.  This is functional, and I do not think that it explains her cardiomyopathy (though mitral regurgitation likely worsens symptoms).  She has a LBBB that is new since the prior ECG in 2019, cannot rule out LBBB cardiomyopathy.  Prior myocarditis is also a consideration.  TEE in 4/21 showed EF 30-35% with prominent septal-lateral dyssynchrony.  Cardiac MRI in 6/21 showed moderate LV dilation, EF 27%, moderately decreased RV function with EF 31%, no LGE, probably moderate MR.  St Jude CRT-P device implanted in 7/21.  Echo in 9/21 with EF up to 40%, improved MR (only mild-moderate). Echo 3/23 showed EF 40-45%, mild LVH, mildly decreased RV systolic function, moderate MR. Echo 8/24 EF 45%, mild MR, normal RV.  NYHA class IIb-III symptoms, suspect anemia is confounded functional class. She is not volume overloaded by exam or Corvue. ReDs 35% - Continue Lasix  80 mg bid.  - Continue spironolactone  50 mg daily. Labs reviewed from 04/03/24 and are stable, K 3.7 and SCr 1.40 - Unable to take Entresto  or valsartan  due to angioedema with both. Would not therefore try ACEi.  - Continue Toprol  XL 100 mg bid.    - Continue Jardiance  10 mg daily.  No GU symptoms. 2. CAD: Nonobstructive on 2020 cath.  No chest pain.  - Continue ASA 81  - Continue Crestor  20 mg daily. Good lipids 1/25 3. Mitral regurgitation: She has functional mitral regurgitation, moderate-severe/3+  at least on TEE in 4/21.  However, after CRT, mitral regurgitation was  mild-moderate on 9/21 echo, moderate on 3/23 echo and only mild on echo 8/24.   4. Fatty liver: She is on rosuvastatin . - LDL 66 (1/25) 5. CKD: Stage 3. Continue Jardiance . Baseline SCr 1.4 - Avoid NSAIDs.  BMET today. 6. Fe deficiency anemia: Planning for EGD next month.  7. HTN: BP elevated in clinic but has been relatively stable on other provider appts over the last 2 months. - Check BP daily and log. Notify clinic if sBP > 140, would then start low dose amlodipine.  Follow up in 6 months with Dr. Rolan Vanessa CHRISTELLA Glena, FNP-BC 09/28/2024

## 2024-09-28 NOTE — Telephone Encounter (Signed)
 Called to confirm/remind patient of their appointment at [facility] on 10/01/24.   Appointment:   [x] Confirmed  [] Left mess   [] No answer/No voice mail  [] VM Full/unable to leave message  [] Phone not in service  Patient reminded to bring all medications and/or complete list.  Confirmed patient has transportation. Gave directions, instructed to utilize valet parking.

## 2024-10-01 ENCOUNTER — Ambulatory Visit (HOSPITAL_COMMUNITY)

## 2024-10-04 DIAGNOSIS — H35341 Macular cyst, hole, or pseudohole, right eye: Secondary | ICD-10-CM | POA: Diagnosis not present

## 2024-10-16 ENCOUNTER — Ambulatory Visit (HOSPITAL_COMMUNITY)

## 2024-10-22 ENCOUNTER — Ambulatory Visit: Payer: Medicare Other

## 2024-10-22 DIAGNOSIS — I428 Other cardiomyopathies: Secondary | ICD-10-CM

## 2024-10-23 LAB — CUP PACEART REMOTE DEVICE CHECK
Battery Remaining Longevity: 47 mo
Battery Remaining Percentage: 45 %
Battery Voltage: 2.96 V
Brady Statistic AP VP Percent: 1.3 %
Brady Statistic AP VS Percent: 1 %
Brady Statistic AS VP Percent: 98 %
Brady Statistic AS VS Percent: 1 %
Brady Statistic RA Percent Paced: 1 %
Date Time Interrogation Session: 20251229020011
Implantable Lead Connection Status: 753985
Implantable Lead Connection Status: 753985
Implantable Lead Connection Status: 753985
Implantable Lead Implant Date: 20210701
Implantable Lead Implant Date: 20210701
Implantable Lead Implant Date: 20210701
Implantable Lead Location: 753858
Implantable Lead Location: 753859
Implantable Lead Location: 753860
Implantable Pulse Generator Implant Date: 20210701
Lead Channel Impedance Value: 1075 Ohm
Lead Channel Impedance Value: 530 Ohm
Lead Channel Impedance Value: 560 Ohm
Lead Channel Pacing Threshold Amplitude: 0.75 V
Lead Channel Pacing Threshold Amplitude: 0.75 V
Lead Channel Pacing Threshold Amplitude: 0.75 V
Lead Channel Pacing Threshold Pulse Width: 0.5 ms
Lead Channel Pacing Threshold Pulse Width: 0.5 ms
Lead Channel Pacing Threshold Pulse Width: 0.5 ms
Lead Channel Sensing Intrinsic Amplitude: 12 mV
Lead Channel Sensing Intrinsic Amplitude: 4.9 mV
Lead Channel Setting Pacing Amplitude: 2 V
Lead Channel Setting Pacing Amplitude: 2 V
Lead Channel Setting Pacing Amplitude: 2 V
Lead Channel Setting Pacing Pulse Width: 0.5 ms
Lead Channel Setting Pacing Pulse Width: 0.5 ms
Lead Channel Setting Sensing Sensitivity: 2 mV
Pulse Gen Model: 3562
Pulse Gen Serial Number: 3818336

## 2024-10-26 ENCOUNTER — Ambulatory Visit: Payer: Self-pay | Admitting: Cardiology

## 2024-10-30 NOTE — Progress Notes (Signed)
 Remote PPM Transmission

## 2024-11-05 ENCOUNTER — Ambulatory Visit: Attending: Cardiology

## 2024-11-05 DIAGNOSIS — I5022 Chronic systolic (congestive) heart failure: Secondary | ICD-10-CM | POA: Diagnosis not present

## 2024-11-05 DIAGNOSIS — Z950 Presence of cardiac pacemaker: Secondary | ICD-10-CM

## 2024-11-06 ENCOUNTER — Telehealth (HOSPITAL_COMMUNITY): Payer: Self-pay

## 2024-11-06 NOTE — Telephone Encounter (Signed)
 Called to confirm/remind patient of their appointment at [facility] on 11/07/24.   Appointment:   [] Confirmed  [x] Left mess   [] No answer/No voice mail  [] VM Full/unable to leave message  [] Phone not in service  Patient reminded to bring in all medications and/or complete list.

## 2024-11-06 NOTE — Progress Notes (Signed)
 PCP: Gable Cambric, MD  Cardiology: Jennifer JONELLE Crape, MD HF Cardiology: Dr. Rolan  89 y.o. with history of chronic systolic CHF (nonischemic cardiomyopathy), chronic LBBB, and mitral regurgitation was referred by Dr. Wonda for CHF evaluation prior to possible Mitraclip placement.   CHF was first noted in 12/20, she was admitted to the hospital in [facility] at that time.  LHC showed nonobstructive CAD.  She later followed up with Dr. Crape and had echo in 3/21, showing EF 35-40% with concern for severe MR.  TEE was then done in 4/21 showing EF 30-35% with septal-lateral dyssynchrony, moderate-severe functional MR, normal RV, severe TR.  She has had a LBBB on ECGs in 2021, she did not have LBBB in 2019.    Cardiac MRI in 6/21 showed moderate LV dilation, EF 27%, moderately decreased RV function with EF 31%, no LGE, probably moderate MR.   She had St Jude CRT-P device implanted in 7/21.  Echo in 9/21 showed EF up to 40% with normal RV, mild-moderate MR. Echo 3/23 showed EF 40-45%, mild LVH, mildly decreased RV systolic function, moderate MR.   Repeat echo 8/24 EF 45%, mild MR. Normal RV.   Had EGD done 9/24 for evaluation of IDA. EGD showed non bleeding, healing, gastric ulcer =>biopsied and benign, + several gastric polyps. Also found to have esophageal candidiasis and has been started on Diflucan . Repeat EGD 7/25 which revealed 3 angioectasias that were ablated, colonoscopy showed multiple small polyps and internal hemorrhoids.   Today she returns for HF follow up. Overall feeling fine. No SOB with ADLs or walking on flat ground. Legs are sore to the touch, on-going x years, was told she has neuropathy. No rest pain or pedal ulcers. Denies  palpitations, abnormal bleeding, CP, dizziness, edema, or PND/Orthopnea. Appetite ok. Weight at home 146 pounds. Taking all medications, has been out of spiro x 1 week. Wears CPAP. Home BP 136-150's/70s   St Jude device interrogation (personally reviewed):  daily impedence down suggesting volume may be creeping up, >99% BiV pacing, 0% AF.   ECG (personally reviewed):  NSR, BiV pacing  Labs (1/24): hgb 8.9, LDL 32 Labs (6/24): hgb 10.9, K 3.6, creatinine 1.12 Labs (9/24): hgb 13.6, Scr 1.32, K 3.3 Labs (1/25): K 3.4, creatinine 1.17, hgb 11.7, LDL 66 Labs (7/25): K 3.9, creatinine 1.20  PMH: 1. LBBB: Chronic.  2. Anemia: Prior GI workup in [place] was negative.  No history of overt GI bleeding. Fe deficiency.  3. Type 2 diabetes 4. Hyperlipidemia 5. CAD: LHC (2020) with 40% ostial left main, 50% mid RCA.  6. Chronic systolic CHF: Noted since 12/20.  Nonischemic cardiomyopathy.  - TEE (4/21): EF 30-35%, septal-lateral dyssynchrony, moderate-severe MR appears functional, normal RV size and systolic function, severe biatrial enlargement, severe TR.  - Cardiac MRI (6/21): Moderate LV dilation, EF 27%; moderately decreased RV function with EF 31%, no LGE, probably moderate MR.  - St Jude CRT-P implanted 7/21.  - Echo (9/21): EF 40%, diffuse hypokinesis, mildly decreased RV systolic function, mild-moderate MR, normal IVC.  - Echo (3/23): EF 40-45%, mild LVH, mildly decreased RV systolic function, moderate MR. - Echo (8/24): EF 45%, normal RV, mild MR 7. GERD 8. Hyperlipidemia 9. Hypothyroidism 10. Mitral regurgitation: Suspected to be functional.  Echo in 9/21 with improved MR, appears mild-moderate.  11. Fe deficiency anemia 12. Fatty liver 13. CKD stage 3 14. Gastric ulcer  Social History   Socioeconomic History   Marital status: Widowed    Spouse  name: Not on file   Number of children: 2   Years of education: Not on file   Highest education level: Not on file  Occupational History   Occupation: retired  Tobacco Use   Smoking status: Former   Smokeless tobacco: Never  Advertising Account Planner   Vaping status: Never Used  Substance and Sexual Activity   Alcohol use: No   Drug use: No   Sexual activity: Not on file  Other Topics Concern    Not on file  Social History Narrative   Not on file   Social Drivers of Health   Tobacco Use: Medium Risk (06/05/2024)   Patient History    Smoking Tobacco Use: Former    Smokeless Tobacco Use: Never    Passive Exposure: Not on Actuary Strain: Not on file  Food Insecurity: Not on file  Transportation Needs: Not on file  Physical Activity: Not on file  Stress: Not on file  Social Connections: Not on file  Intimate Partner Violence: Not on file  Depression (PHQ2-9): Low Risk (09/11/2024)   Depression (PHQ2-9)    PHQ-2 Score: 0  Alcohol Screen: Not on file  Housing: Not on file  Utilities: Not on file  Health Literacy: Not on file   Family History  Problem Relation Age of Onset   Diabetes Mother    Heart disease Mother    Hypertension Mother    Heart attack Mother    Emphysema Father    Hypertension Father    Heart attack Father    Breast cancer Sister    Diabetes Sister    Emphysema Sister    COPD Sister    Asthma Sister    Hypertension Sister    Lung cancer Brother    Diabetes Brother    Emphysema Brother    COPD Brother    Asthma Brother    Heart disease Brother    Liver disease Neg Hx    Colon cancer Neg Hx    Colon polyps Neg Hx    Esophageal cancer Neg Hx    Rectal cancer Neg Hx    Stomach cancer Neg Hx    ROS: All systems reviewed and negative except as per HPI.   Current Outpatient Medications  Medication Sig Dispense Refill   albuterol  (PROVENTIL ) (2.5 MG/3ML) 0.083% nebulizer solution Take 2.5 mg by nebulization every 6 (six) hours as needed for wheezing or shortness of breath.      aspirin  EC 81 MG tablet Take 1 tablet (81 mg total) by mouth daily. 30 tablet 11   b complex vitamins tablet Take 1 tablet by mouth daily after lunch.     cetirizine (ZYRTEC) 10 MG tablet Take 10 mg by mouth in the morning.     Cholecalciferol (VITAMIN D-3 PO) Take 1 tablet by mouth daily after lunch.     cyclobenzaprine (FLEXERIL) 10 MG tablet Take  10 mg by mouth at bedtime as needed for muscle spasms.      empagliflozin  (JARDIANCE ) 25 MG TABS tablet Take 25 mg by mouth daily.     famotidine (PEPCID) 40 MG tablet Take 40 mg by mouth daily as needed for heartburn.     fluticasone (FLONASE) 50 MCG/ACT nasal spray Place 2 sprays into both nostrils as needed for allergies or rhinitis.     furosemide  (LASIX ) 40 MG tablet TAKE 2 TABLETS TWICE A DAY (CHANGE IN DOSAGE OR PILL SIZE) 180 tablet 7   levalbuterol (XOPENEX HFA) 45 MCG/ACT inhaler Inhale 2  puffs into the lungs every 4 (four) hours as needed for wheezing.     meclizine (ANTIVERT) 25 MG tablet Take 25 mg by mouth every 8 (eight) hours as needed for dizziness or nausea.      metoprolol  succinate (TOPROL -XL) 100 MG 24 hr tablet Take 1 tablet (100 mg total) by mouth in the morning and at bedtime. Take with or immediately following a meal. 180 tablet 3   Misc Natural Products (FOCUSED MIND PO) Take 1 tablet by mouth daily.     montelukast (SINGULAIR) 10 MG tablet Take 10 mg by mouth at bedtime.      nitroGLYCERIN  (NITROSTAT ) 0.4 MG SL tablet Place 1 tablet (0.4 mg total) under the tongue every 5 (five) minutes as needed for chest pain. 30 tablet 2   Omega-3 Fatty Acids (FISH OIL) 1200 MG CAPS Take 1,200 mg by mouth in the morning and at bedtime.     Polyethyl Glycol-Propyl Glycol (SYSTANE OP) Place 1 drop into both eyes 4 (four) times daily.     polyethylene glycol (MIRALAX / GLYCOLAX) packet Take 17 g by mouth in the morning.     rosuvastatin  (CRESTOR ) 20 MG tablet TAKE 1 TABLET DAILY ( CHANGE IN DOSAGE ) 90 tablet 3   TIROSINT 75 MCG CAPS Take 75 mcg by mouth daily before breakfast.     triamcinolone  cream (KENALOG ) 0.1 % Apply 1 Application topically daily as needed (skin irritation/eczema).     vitamin E 400 UNIT capsule Take 400 Units by mouth daily after lunch.     Wheat Dextrin (BENEFIBER) POWD Take 1 Scoop by mouth daily as needed for constipation (constipation/regularity).      JARDIANCE  10 MG TABS tablet TAKE 1 TABLET DAILY BEFORE BREAKFAST 90 tablet 3   metFORMIN  (GLUCOPHAGE ) 500 MG tablet Take 250 mg by mouth 2 (two) times daily. (Patient not taking: Reported on 11/07/2024)     spironolactone  (ALDACTONE ) 25 MG tablet Take 2 tablets (50 mg total) by mouth at bedtime. (Patient not taking: Reported on 11/07/2024) 90 tablet 3   No current facility-administered medications for this encounter.   Wt Readings from Last 3 Encounters:  11/07/24 67 kg (147 lb 12.8 oz)  09/11/24 66.3 kg (146 lb 1.6 oz)  06/05/24 66.3 kg (146 lb 3.2 oz)   BP (!) 182/92   Pulse 79   Wt 67 kg (147 lb 12.8 oz)   SpO2 97%   BMI 27.93 kg/m    PHYSICAL EXAM: General:  NAD. No resp difficulty, walked into clinic with cane, elderly HEENT: Normal Neck: Supple. No JVD. Cor: Regular rate & rhythm. No rubs, gallops or murmurs. Lungs: Clear Abdomen: Soft, nontender, nondistended.  Extremities: No cyanosis, clubbing, rash, edema Neuro: Alert & oriented x 3, moves all 4 extremities w/o difficulty. Affect pleasant.  ASSESSMENT/PLAN: 1. Chronic systolic CHF: Patient has a nonischemic cardiomyopathy of uncertain etiology.  She has significant mitral regurgitation.  This is functional, and I do not think that it explains her cardiomyopathy (though mitral regurgitation likely worsens symptoms).  She has a LBBB that is new since the prior ECG in 2019, cannot rule out LBBB cardiomyopathy.  Prior myocarditis is also a consideration.  TEE in 4/21 showed EF 30-35% with prominent septal-lateral dyssynchrony.  Cardiac MRI in 6/21 showed moderate LV dilation, EF 27%, moderately decreased RV function with EF 31%, no LGE, probably moderate MR.  St Jude CRT-P device implanted in 7/21.  Echo in 9/21 with EF up to 40%, improved MR (only mild-moderate).  Echo 3/23 showed EF 40-45%, mild LVH, mildly decreased RV systolic function, moderate MR. Echo 8/24 EF 45%, mild MR, normal RV.  NYHA class II. She is not volume overloaded  by exam or Corvue.  - Restart spiro 50 mg at bedtime. BMET today. - Continue Lasix  80 mg bid.  - Unable to take Entresto  or valsartan  due to angioedema with both. Would not therefore try ACEi.  - Continue Toprol  XL 100 mg bid.    - Continue Jardiance  25mg  daily.  No GU symptoms. - update echo next visit. 2. CAD: Nonobstructive on 2020 cath.  No chest pain.  - Continue ASA 81  - Continue Crestor  20 mg daily. Check lipids today. 3. Mitral regurgitation: She has functional mitral regurgitation, moderate-severe/3+ at least on TEE in 4/21.  However, after CRT, mitral regurgitation was mild-moderate on 9/21 echo, moderate on 3/23 echo and only mild on echo 8/24.   - update echo next visit. 4. Fatty liver: She is on rosuvastatin . Checking lipids today. 5. CKD: Stage 3. Baseline SCr 1.4 - Avoid NSAIDs.   - Continue Jardiance . BMET today. 6. Fe deficiency anemia: followed by GI and Hematology 7. HTN: BP elevated in clinic, has been out of spiro and restarting as above - Check BP daily and log. Notify clinic if sBP > 140, would then start low dose amlodipine.  Follow up in 3 months with Dr. Rolan + echo  Harlene HERO Smarr, [place] 11/07/2024

## 2024-11-07 ENCOUNTER — Encounter (HOSPITAL_COMMUNITY): Payer: Self-pay

## 2024-11-07 ENCOUNTER — Telehealth: Payer: Self-pay

## 2024-11-07 ENCOUNTER — Ambulatory Visit (HOSPITAL_COMMUNITY)
Admission: RE | Admit: 2024-11-07 | Discharge: 2024-11-07 | Disposition: A | Discharge status: Home / Self Care | Source: Ambulatory Visit | Attending: Family Medicine | Admitting: Family Medicine

## 2024-11-07 VITALS — BP 182/92 | HR 79 | Wt 147.8 lb

## 2024-11-07 DIAGNOSIS — I5022 Chronic systolic (congestive) heart failure: Secondary | ICD-10-CM | POA: Diagnosis not present

## 2024-11-07 DIAGNOSIS — N183 Chronic kidney disease, stage 3 unspecified: Secondary | ICD-10-CM | POA: Insufficient documentation

## 2024-11-07 DIAGNOSIS — Z7982 Long term (current) use of aspirin: Secondary | ICD-10-CM | POA: Insufficient documentation

## 2024-11-07 DIAGNOSIS — I130 Hypertensive heart and chronic kidney disease with heart failure and stage 1 through stage 4 chronic kidney disease, or unspecified chronic kidney disease: Secondary | ICD-10-CM | POA: Insufficient documentation

## 2024-11-07 DIAGNOSIS — Z950 Presence of cardiac pacemaker: Secondary | ICD-10-CM | POA: Insufficient documentation

## 2024-11-07 DIAGNOSIS — I081 Rheumatic disorders of both mitral and tricuspid valves: Secondary | ICD-10-CM | POA: Insufficient documentation

## 2024-11-07 DIAGNOSIS — K760 Fatty (change of) liver, not elsewhere classified: Secondary | ICD-10-CM | POA: Diagnosis not present

## 2024-11-07 DIAGNOSIS — D509 Iron deficiency anemia, unspecified: Secondary | ICD-10-CM | POA: Diagnosis not present

## 2024-11-07 DIAGNOSIS — Z8249 Family history of ischemic heart disease and other diseases of the circulatory system: Secondary | ICD-10-CM | POA: Diagnosis not present

## 2024-11-07 DIAGNOSIS — Z79899 Other long term (current) drug therapy: Secondary | ICD-10-CM | POA: Diagnosis not present

## 2024-11-07 DIAGNOSIS — I447 Left bundle-branch block, unspecified: Secondary | ICD-10-CM | POA: Diagnosis not present

## 2024-11-07 DIAGNOSIS — I10 Essential (primary) hypertension: Secondary | ICD-10-CM | POA: Diagnosis not present

## 2024-11-07 DIAGNOSIS — I428 Other cardiomyopathies: Secondary | ICD-10-CM | POA: Diagnosis not present

## 2024-11-07 DIAGNOSIS — R9431 Abnormal electrocardiogram [ECG] [EKG]: Secondary | ICD-10-CM | POA: Insufficient documentation

## 2024-11-07 DIAGNOSIS — D508 Other iron deficiency anemias: Secondary | ICD-10-CM

## 2024-11-07 DIAGNOSIS — I2510 Atherosclerotic heart disease of native coronary artery without angina pectoris: Secondary | ICD-10-CM | POA: Diagnosis not present

## 2024-11-07 DIAGNOSIS — I340 Nonrheumatic mitral (valve) insufficiency: Secondary | ICD-10-CM

## 2024-11-07 LAB — LIPID PANEL
Cholesterol: 166 mg/dL (ref 0–200)
HDL: 49 mg/dL
LDL Cholesterol: 65 mg/dL (ref 0–99)
Total CHOL/HDL Ratio: 3.4 ratio
Triglycerides: 262 mg/dL — ABNORMAL HIGH
VLDL: 52 mg/dL — ABNORMAL HIGH (ref 0–40)

## 2024-11-07 LAB — COMPREHENSIVE METABOLIC PANEL WITH GFR
ALT: 32 U/L (ref 0–44)
AST: 34 U/L (ref 15–41)
Albumin: 4.9 g/dL (ref 3.5–5.0)
Alkaline Phosphatase: 140 U/L — ABNORMAL HIGH (ref 38–126)
Anion gap: 14 (ref 5–15)
BUN: 21 mg/dL (ref 8–23)
CO2: 29 mmol/L (ref 22–32)
Calcium: 9.8 mg/dL (ref 8.9–10.3)
Chloride: 94 mmol/L — ABNORMAL LOW (ref 98–111)
Creatinine, Ser: 1.14 mg/dL — ABNORMAL HIGH (ref 0.44–1.00)
GFR, Estimated: 47 mL/min — ABNORMAL LOW
Glucose, Bld: 116 mg/dL — ABNORMAL HIGH (ref 70–99)
Potassium: 3.8 mmol/L (ref 3.5–5.1)
Sodium: 137 mmol/L (ref 135–145)
Total Bilirubin: 0.9 mg/dL (ref 0.0–1.2)
Total Protein: 7.5 g/dL (ref 6.5–8.1)

## 2024-11-07 NOTE — Patient Instructions (Addendum)
 No change in medications. Labs today - will call you if abnormal. Please call Heart Failure Clinic at [phone] if you home blood pressure is > 14 after you re-start your medications.  Return to see Dr. Rolan with echo in 3 months - see below. Please call us  at [phone] if any questions or concerns prior to your next appointment.

## 2024-11-07 NOTE — Telephone Encounter (Signed)
 Remote ICM transmission received.  Attempted call to patient regarding ICM remote transmission and no answer.

## 2024-11-07 NOTE — Progress Notes (Signed)
 EPIC Encounter for ICM Monitoring  Patient Name: Vanessa Johnson is a 89 y.o. female Date: 11/07/2024 Primary Care Physican: Gable Cambric, MD Primary Cardiologist: Revankar/McLean Electrophysiologist: Inocencio Pore Pacing: >99%   (07/16/2024 report)       09/28/2023 Weight: 151-154 lbs  11/02/2023 Weight: 156 lbs 12/07/2023 Weight: 155 lbs       01/11/2024 Weight: 154.2 lbs (153-157 lbs)  02/14/2024 Weight: 151-154 lbs         06/05/2024 Weight: 146 lbs      07/17/2024 Weight: 144.8 lbs                                  Attempted call to patient and unable to reach.  Transmission results reviewed.      Diet:  No updates    Since 09/24/2024 ICM Remote Transmission: CorVue thoracic impedance suggesting intermittent days with possible fluid accumulation.   Prescribed:  Furosemide  40 mg take 2 tablet(s) (80 mg total) by mouth twice a day.  Spironolactone  25 mg take 2 tablets (50 mg total) by mouth at bedtime.   Labs: 05/11/2024 Creatinine 1.20, BUN 24, Potassium 3.9, Sodium 137, GFR 44  04/06/2024 Creatinine 1.43, BUN 20, Potassium 3.5, Sodium 136 04/03/2024 Creatinine 1.40, BUN 26, Potassium 3.7, Sodium 134 11/25/2023 Creatinine 1.34, BUN 21, Potassium 4.6, Sodium 139, GFR 39  11/11/2023 Creatinine 1.17, BUN 26, Potassium 3.4, Sodium 140  A complete set of results can be found in Results Review.   Recommendations: Unable to reach.     Follow-up plan: ICM clinic phone appointment on 12/06/2024.   91 day device clinic remote transmission 01/21/2025.     EP/Cardiology Office Visits:    Recall 04/15/2024 with Dr Waddell.  Recall 03/02/2025 with Dr Edwyna.   Copy of ICM check sent to Dr. Inocencio.  Remote monitoring is medically necessary for Heart Failure Management.    Daily Thoracic Impedance ICM trend: 08/07/2024 through 11/05/2024.    12-14 Month Thoracic Impedance ICM trend:     [name], RN 11/07/2024 1:11 PM

## 2024-11-09 ENCOUNTER — Ambulatory Visit (HOSPITAL_COMMUNITY): Payer: Self-pay | Admitting: Family Medicine

## 2024-11-15 ENCOUNTER — Other Ambulatory Visit (HOSPITAL_COMMUNITY): Payer: Self-pay

## 2024-11-15 MED ORDER — SPIRONOLACTONE 25 MG PO TABS: 50.0000 mg | ORAL_TABLET | Freq: Every evening | ORAL | 3 refills | Status: AC

## 2024-11-22 NOTE — Progress Notes (Signed)
 31 day ICM Remote transmission canceled due to [facility] clinic is on hold until further notice.  91 day remote monitoring will continue per protocol.

## 2024-12-06 ENCOUNTER — Ambulatory Visit

## 2025-01-29 ENCOUNTER — Other Ambulatory Visit (HOSPITAL_COMMUNITY)

## 2025-01-29 ENCOUNTER — Ambulatory Visit (HOSPITAL_COMMUNITY): Admitting: Cardiology

## 2025-03-11 ENCOUNTER — Inpatient Hospital Stay

## 2025-03-11 ENCOUNTER — Inpatient Hospital Stay: Admitting: Oncology
